# Patient Record
Sex: Female | Born: 1935 | Race: White | Hispanic: No | Smoking: Former smoker
Health system: Southern US, Community
[De-identification: ages and names within clinical notes are randomized; demographics above are authoritative.]

## PROBLEM LIST (undated history)

## (undated) DIAGNOSIS — Z87440 Personal history of urinary (tract) infections: Secondary | ICD-10-CM

## (undated) DIAGNOSIS — Z8619 Personal history of other infectious and parasitic diseases: Secondary | ICD-10-CM

## (undated) DIAGNOSIS — N184 Chronic kidney disease, stage 4 (severe): Secondary | ICD-10-CM

## (undated) DIAGNOSIS — Z872 Personal history of diseases of the skin and subcutaneous tissue: Secondary | ICD-10-CM

## (undated) DIAGNOSIS — I2510 Atherosclerotic heart disease of native coronary artery without angina pectoris: Secondary | ICD-10-CM

## (undated) DIAGNOSIS — E785 Hyperlipidemia, unspecified: Secondary | ICD-10-CM

## (undated) DIAGNOSIS — F419 Anxiety disorder, unspecified: Secondary | ICD-10-CM

## (undated) DIAGNOSIS — I480 Paroxysmal atrial fibrillation: Secondary | ICD-10-CM

## (undated) DIAGNOSIS — J9690 Respiratory failure, unspecified, unspecified whether with hypoxia or hypercapnia: Secondary | ICD-10-CM

## (undated) DIAGNOSIS — I10 Essential (primary) hypertension: Secondary | ICD-10-CM

## (undated) DIAGNOSIS — I447 Left bundle-branch block, unspecified: Secondary | ICD-10-CM

## (undated) DIAGNOSIS — I5042 Chronic combined systolic (congestive) and diastolic (congestive) heart failure: Secondary | ICD-10-CM

## (undated) HISTORY — DX: Anxiety disorder, unspecified: F41.9

## (undated) HISTORY — DX: Personal history of urinary (tract) infections: Z87.440

## (undated) HISTORY — PX: SALPINGOOPHORECTOMY: SHX82

## (undated) HISTORY — PX: APPENDECTOMY: SHX54

## (undated) HISTORY — DX: Personal history of diseases of the skin and subcutaneous tissue: Z87.2

## (undated) HISTORY — PX: ABDOMINAL HYSTERECTOMY: SHX81

## (undated) HISTORY — PX: CORONARY ANGIOPLASTY WITH STENT PLACEMENT: SHX49

## (undated) HISTORY — PX: BREAST CYST EXCISION: SHX579

## (undated) HISTORY — PX: CATARACT EXTRACTION, BILATERAL: SHX1313

## (undated) HISTORY — DX: Personal history of other infectious and parasitic diseases: Z86.19

---

## 1998-04-22 ENCOUNTER — Other Ambulatory Visit: Admission: RE | Admit: 1998-04-22 | Discharge: 1998-04-22 | Payer: Self-pay | Admitting: *Deleted

## 1999-10-27 ENCOUNTER — Other Ambulatory Visit: Admission: RE | Admit: 1999-10-27 | Discharge: 1999-10-27 | Payer: Self-pay | Admitting: *Deleted

## 2000-09-28 ENCOUNTER — Other Ambulatory Visit: Admission: RE | Admit: 2000-09-28 | Discharge: 2000-09-28 | Payer: Self-pay | Admitting: *Deleted

## 2001-10-29 ENCOUNTER — Other Ambulatory Visit: Admission: RE | Admit: 2001-10-29 | Discharge: 2001-10-29 | Payer: Self-pay | Admitting: *Deleted

## 2003-05-29 DIAGNOSIS — Z8619 Personal history of other infectious and parasitic diseases: Secondary | ICD-10-CM

## 2003-05-29 HISTORY — DX: Personal history of other infectious and parasitic diseases: Z86.19

## 2003-06-09 ENCOUNTER — Encounter: Admission: RE | Admit: 2003-06-09 | Discharge: 2003-06-09 | Payer: Self-pay

## 2003-07-16 ENCOUNTER — Ambulatory Visit (HOSPITAL_BASED_OUTPATIENT_CLINIC_OR_DEPARTMENT_OTHER): Admission: RE | Admit: 2003-07-16 | Discharge: 2003-07-16 | Payer: Self-pay | Admitting: Urology

## 2003-09-23 ENCOUNTER — Inpatient Hospital Stay (HOSPITAL_COMMUNITY): Admission: EM | Admit: 2003-09-23 | Discharge: 2003-09-28 | Payer: Self-pay | Admitting: Emergency Medicine

## 2003-09-24 ENCOUNTER — Encounter: Payer: Self-pay | Admitting: Cardiology

## 2003-10-10 ENCOUNTER — Inpatient Hospital Stay (HOSPITAL_COMMUNITY): Admission: EM | Admit: 2003-10-10 | Discharge: 2003-10-14 | Payer: Self-pay | Admitting: Emergency Medicine

## 2003-10-13 ENCOUNTER — Encounter (INDEPENDENT_AMBULATORY_CARE_PROVIDER_SITE_OTHER): Payer: Self-pay | Admitting: *Deleted

## 2004-04-08 ENCOUNTER — Encounter: Payer: Self-pay | Admitting: Cardiovascular Disease

## 2006-03-01 ENCOUNTER — Encounter: Admission: RE | Admit: 2006-03-01 | Discharge: 2006-03-01 | Payer: Self-pay | Admitting: Family Medicine

## 2008-02-13 ENCOUNTER — Ambulatory Visit: Payer: Self-pay | Admitting: Vascular Surgery

## 2010-01-11 ENCOUNTER — Encounter: Admission: RE | Admit: 2010-01-11 | Discharge: 2010-01-11 | Payer: Self-pay | Admitting: Family Medicine

## 2010-02-01 ENCOUNTER — Encounter: Admission: RE | Admit: 2010-02-01 | Discharge: 2010-02-01 | Payer: Self-pay | Admitting: Family Medicine

## 2010-06-24 ENCOUNTER — Encounter: Payer: Self-pay | Admitting: Cardiovascular Disease

## 2010-08-24 ENCOUNTER — Ambulatory Visit: Payer: Self-pay | Admitting: Cardiovascular Disease

## 2010-08-24 DIAGNOSIS — R011 Cardiac murmur, unspecified: Secondary | ICD-10-CM

## 2010-08-24 DIAGNOSIS — I10 Essential (primary) hypertension: Secondary | ICD-10-CM

## 2010-08-24 DIAGNOSIS — I359 Nonrheumatic aortic valve disorder, unspecified: Secondary | ICD-10-CM | POA: Insufficient documentation

## 2010-08-24 DIAGNOSIS — R0989 Other specified symptoms and signs involving the circulatory and respiratory systems: Secondary | ICD-10-CM

## 2010-09-09 ENCOUNTER — Encounter: Payer: Self-pay | Admitting: Cardiovascular Disease

## 2010-09-09 ENCOUNTER — Ambulatory Visit: Payer: Self-pay

## 2010-09-09 ENCOUNTER — Ambulatory Visit: Payer: Self-pay | Admitting: Cardiology

## 2010-09-09 ENCOUNTER — Ambulatory Visit (HOSPITAL_COMMUNITY): Admission: RE | Admit: 2010-09-09 | Discharge: 2010-09-09 | Payer: Self-pay | Admitting: Cardiovascular Disease

## 2010-11-17 ENCOUNTER — Ambulatory Visit: Payer: Self-pay | Admitting: Cardiovascular Disease

## 2010-12-28 NOTE — Assessment & Plan Note (Signed)
Summary: NPV   Visit Type:  Initial Consult Referring Provider:  Elvis Coil, M.D. Primary Provider:  Ursula Beath  CC:  Murmur.  History of Present Illness: 77 year-old woman presents for initial evaluation of a cardiac murmur.  She has been followed by the [facility] group but has decided to change to our practice.   The patient has had echocardiograms in the past and these have shown aortic sclerosis and mitral valve thickening. This dates back to 2004 and I don't have access to more recent echo studies.  She was recently seen by Dr Hyman Hopes and referred here for further evaluation and continuation of care.  She complains of exertional dyspnea, stable over several years. She denies exertional chest pain or tightness. She has an occasional 'stinging' pain in the center of her chest lasting only a short period. These pains are not associated with exertion. The patient denies edema, orthopnea, PND, or syncope. She has no history of stroke or TIA.     Current Medications (verified): 1)  Alprazolam 0.25 Mg Tabs (Alprazolam) .... As Needed 2)  Hydrocodone-Acetaminophen 5-500 Mg Tabs (Hydrocodone-Acetaminophen) .... As Needed 3)  Nisoldipine 8.5 Mg Xr24h-Tab (Nisoldipine) .... Take 1 Tab By Mouth At Bedtime 4)  Preservision/lutein  Caps (Multiple Vitamins-Minerals) .... Take 1 Capsule By Mouth Once A Day 5)  Atenolol 50 Mg Tabs (Atenolol) .... Take  1  Tablet By Mouth Daily 6)  Diovan 160 Mg Tabs (Valsartan) .... Take 1/2  Tablet By Mouth Two Times A Day 7)  Brahmi-Gotu Marathon Oil .... Take 1 Capsule By Mouth Two Times A Day For Memory 8)  Nasal Spray .... As Needed 9)  Aspirin 81 Mg Tbec (Aspirin) .... Take 1 Tablet By Mouth Two Times A Day 10)  Cinnamon 500 Mg Caps (Cinnamon) .... 1000mg  Two Times A Day 11)  Vitamin D 1000 Unit Tabs (Cholecalciferol) .... Take 1 Tablet By Mouth Once A Day 12)  L-Lysine 500 Mg Tabs (Lysine) .... Take 1 Tablet By Mouth Once A Day 13)   Glucosamine-Chondroitin  Caps (Glucosamine-Chondroit-Vit C-Mn) .... Take 1 Capsule By Mouth Once A Day 14)  Cod Liver Oil  Caps (Cod Liver Oil) .... Take 1 Capsule By Mouth Once A Day 15)  Calcium Plus Vitamin D3 600-500 Mg-Unit Caps (Calcium Carb-Cholecalciferol) .... Take 1 Tablet By Mouth Two Times A Day 16)  Vitamin C 500 Mg Tabs (Ascorbic Acid) .... Take 1 Tablet By Mouth Once A Day 17)  Super B Complex  Tabs (B Complex-C) .... Take 1 Tablet By Mouth Once A Day 18)  Vitamin E 400 Unit Caps (Vitamin E) .... Take 1 Capsule By Mouth Once A Day  Allergies (verified): 1)  ! Sulfa  Past History:  Past medical, surgical, family and social histories (including risk factors) reviewed, and no changes noted (except as noted below).  Past Medical History: Reviewed history from 07/06/2010 and no changes required.   Bilateral lower extremity edema   Chronic renal insufficiency HX   Labile hypertension.   Aortic insufficiency   Mitral valve regurgitation   Anxiety.    Major depressive disorder, single episode, severe.    Failure to thrive.    history of shingles in July 2004 on the left side of her thorax and postherpetic neuralgia.     She has a history of psoriasis,    history of recurrent urinary tract infections.    History of salpingo-oophorectomy.   Past Surgical History: Reviewed history from 07/06/2010 and no changes required.  She is status post appendectomy, breast cyst removed.  Family History: Reviewed history from 07/06/2010 and no changes required.  Significant for diabetes, cancer, specifically lung cancer  in family members who were smokers.   Social History: Reviewed history from 07/06/2010 and no changes required.  She denies alcohol. Quit smoking tobacco in 1980.   Patient is widowed and lives in [place]  Review of Systems       Negative except as per HPI   Vital Signs:  Patient profile:   77 year old female Height:      61 inches Weight:      136  pounds BMI:     25.79 Pulse rate:   60 / minute Pulse rhythm:   regular Resp:     18 per minute BP sitting:   188 / 84  (left arm) Cuff size:   regular  Vitals Entered By: Vikki Ports (August 24, 2010 3:32 PM)  Serial Vital Signs/Assessments:  Time      Position  BP       Pulse  Resp  Temp     By           R Arm     190/84                         Vikki Ports   Physical Exam  General:  Pt is well-developed, alert and oriented, elderly woman no acute distress HEENT: normal Neck: no thyromegaly           JVP normal, carotid upstrokes normal with bilateral bruits right greater than left Lungs: CTA Chest: equal expansion  CV: Apical impulse nondisplaced, RRR with a harsh systolic murmur over the RUSB and soft systolic murmur at the apex Abd: soft, NT, positive BS, no HSM, no bruit Back: no CVA tenderness Ext: no clubbing, cyanosis, or edema        femoral pulses 2+ without bruits        pedal pulses 2+ and equal Skin: warm, dry, no rash Neuro: CNII-XII intact,strength 5/5 = b/l    EKG  Procedure date:  08/24/2010  Findings:      Sinus bradycardia HR 58 bpm, LBBB  Impression & Recommendations:  Problem # 1:  AORTIC STENOSIS (ICD-424.1) Echo from 2004 reviewed and showed normal LV function with aortic and mitral valve thickening without significant stenosis at that time. Her exam suggests at least mild AS. Recommend 2D echo to fully evaluate her valvular pathology.  She has no history or exam findings suggestive of heart failure.  Her updated medication list for this problem includes:    Atenolol 50 Mg Tabs (Atenolol) .Marland Kitchen... Take  1  tablet by mouth daily    Diovan 160 Mg Tabs (Valsartan) .Marland Kitchen... Take 1/2  tablet by mouth two times a day  Orders: Echocardiogram (Echo)  Problem # 2:  CAROTID BRUIT (ICD-785.9) Pt has a significant right carotid bruit and should be evaluated with a carotid duplex. She is appropriately on antiplatelet Rx with ASA.  Orders: Carotid  Duplex (Carotid Duplex)  Problem # 3:  HYPERTENSION, HEART UNCONTROLLED W/O ASSOC CHF (ICD-402.00) Pt on multiple agents. She reports a history of white coat HTN. I asked her to bring in home readings at the time ofher followup visit.  Her updated medication list for this problem includes:    Nisoldipine 8.5 Mg Xr24h-tab (Nisoldipine) .Marland Kitchen... Take 1 tab by mouth at bedtime    Atenolol 50 Mg Tabs (Atenolol) .Marland KitchenMarland KitchenMarland KitchenMarland Kitchen  Take  1  tablet by mouth daily    Diovan 160 Mg Tabs (Valsartan) .Marland Kitchen... Take 1/2  tablet by mouth two times a day    Aspirin 81 Mg Tbec (Aspirin) .Marland Kitchen... Take 1 tablet by mouth two times a day  BP today: 188/84  Other Orders: EKG w/ Interpretation (93000)  Patient Instructions: 1)  Your physician recommends that you schedule a follow-up appointment in: 1-2 months. 2)  Your physician recommends that you continue on your current medications as directed. Please refer to the Current Medication list given to you today. 3)  Your physician has requested that you have a carotid duplex. This test is an ultrasound of the carotid arteries in your neck. It looks at blood flow through these arteries that supply the brain with blood. Allow one hour for this exam. There are no restrictions or special instructions. 4)  Your physician has requested that you have an echocardiogram.  Echocardiography is a painless test that uses sound waves to create images of your heart. It provides your doctor with information about the size and shape of your heart and how well your heart's chambers and valves are working.  This procedure takes approximately one hour. There are no restrictions for this procedure. 5)  Please record your Blood Pressure at home and bring those readings to your next appointment with Dr.Cooper.

## 2010-12-30 NOTE — Assessment & Plan Note (Signed)
Summary: ROV   Visit Type:  Follow-up echo Referring Provider:  Elvis Coil, M.D. Primary Provider:  Ursula Beath  CC:  none.  History of Present Illness: 77 year-old woman returns for followup of valvular heart disease. Overall she is doing well and only complains of mild, stable exertional dyspnea. No chest pian, palps, lightheadedness, headache, or syncope. No leg swelling.  Current Medications (verified): 1)  Hydrocodone-Acetaminophen 5-500 Mg Tabs (Hydrocodone-Acetaminophen) .... As Needed 2)  Preservision/lutein  Caps (Multiple Vitamins-Minerals) .... Take 1 Capsule By Mouth Once A Day 3)  Atenolol 50 Mg Tabs (Atenolol) .... Take  1  Tablet By Mouth Daily 4)  Diovan 160 Mg Tabs (Valsartan) .... Take 1/2  Tablet By Mouth Two Times A Day 5)  Aspirin 81 Mg Tbec (Aspirin) .... Take 1 Tablet By Mouth Two Times A Day 6)  Cinnamon 500 Mg Caps (Cinnamon) .... 1000mg  Two Times A Day 7)  Vitamin D 1000 Unit Tabs (Cholecalciferol) .... Take 1 Tablet By Mouth Once A Day 8)  L-Lysine 500 Mg Tabs (Lysine) .... Take 1 Tablet By Mouth Once A Day 9)  Glucosamine-Chondroitin  Caps (Glucosamine-Chondroit-Vit C-Mn) .... Take 1 Capsule By Mouth Once A Day 10)  Cod Liver Oil  Caps (Cod Liver Oil) .... Take 1 Capsule By Mouth Once A Day 11)  Calcium Plus Vitamin D3 600-500 Mg-Unit Caps (Calcium Carb-Cholecalciferol) .... Take 1 Tablet By Mouth Two Times A Day 12)  Vitamin C 500 Mg Tabs (Ascorbic Acid) .... Take 1 Tablet By Mouth Once A Day 13)  Super B Complex  Tabs (B Complex-C) .... Take 1 Tablet By Mouth Once A Day 14)  Vitamin E 400 Unit Caps (Vitamin E) .... Take 1 Capsule By Mouth Once A Day 15)  Fish Oil 1200 Mg Caps (Omega-3 Fatty Acids) .... Take 1 Capsule By Mouth Two Times A Day 16)  Flax Seed Oil 1000 Mg Caps (Flaxseed (Linseed)) .... Take 1 Capsule By Mouth Two Times A Day 17)  Sular 8.5 Mg Xr24h-Tab (Nisoldipine) .... Take 1 Tab By Mouth At Bedtime  Allergies: 1)  ! Sulfa  Past  History:  Past medical history reviewed for relevance to current acute and chronic problems.  Past Medical History: Reviewed history from 07/06/2010 and no changes required.   Bilateral lower extremity edema   Chronic renal insufficiency HX   Labile hypertension.   Aortic insufficiency   Mitral valve regurgitation   Anxiety.    Major depressive disorder, single episode, severe.    Failure to thrive.    history of shingles in July 2004 on the left side of her thorax and postherpetic neuralgia.     She has a history of psoriasis,    history of recurrent urinary tract infections.    History of salpingo-oophorectomy.   Review of Systems       Negative except as per HPI   Vital Signs:  Patient profile:   77 year old female Height:      61 inches Weight:      132.50 pounds BMI:     25.13 Pulse rate:   56 / minute Pulse rhythm:   regular Resp:     18 per minute BP sitting:   180 / 80  (left arm) Cuff size:   large  Vitals Entered By: Vikki Ports (December 09, 2010 4:18 PM)  Physical Exam  General:  Pt is well-developed, alert and oriented, elderly woman no acute distress HEENT: normal Neck: no thyromegaly  JVP normal, carotid upstrokes normal with bilateral bruits right greater than left Lungs: CTA Chest: equal expansion  CV: Apical impulse nondisplaced, RRR with a harsh systolic murmur over the RUSB and soft systolic murmur at the apex Abd: soft, NT, positive BS, no HSM, no bruit Back: no CVA tenderness Ext: no clubbing, cyanosis, or edema        femoral pulses 2+ without bruits        pedal pulses 2+ and equal Skin: warm, dry, no rash Neuro: CNII-XII intact,strength 5/5 = b/l    Echocardiogram  Procedure date:  09/09/2010  Findings:      Study Conclusions            - Left ventricle: The cavity size was normal. Wall thickness was       increased in a pattern of mild LVH. Systolic function was mildly       to moderately reduced. The estimated  ejection fraction was in the       range of 40% to 45%. Diffuse hypokinesis. Features are consistent       with a pseudonormal left ventricular filling pattern, with       concomitant abnormal relaxation and increased filling pressure       (grade 2 diastolic dysfunction).     - Aortic valve: Valve mobility was restricted. Moderate to severe       regurgitation.     - Mitral valve: Calcified annulus. Mildly thickened leaflets . Mild       regurgitation.     - Tricuspid valve: Moderate regurgitation.     - Pulmonary arteries: Systolic pressure was mildly increased. PA       peak pressure: 40mm Hg (S).  Impression & Recommendations:  Problem # 1:  AORTIC STENOSIS (ICD-424.1) Pt has mixed aortic valvular disease, predominately aortic insufficiency. Interestingly, her exam shows a predominant systolic rather than a diastolic murmur. She has mild LV dysfunction but no evidence of LV dilatation. I have recommended repeating an echocardiogram in October 2012 for one year followup and will reevaluate her at that point. No indication for valve surgery at this point.  Her updated medication list for this problem includes:    Atenolol 50 Mg Tabs (Atenolol) .Marland Kitchen... Take  1  tablet by mouth daily    Diovan 160 Mg Tabs (Valsartan) .Marland Kitchen... Take 1/2  tablet by mouth two times a day  Problem # 2:  HYPERTENSION, HEART UNCONTROLLED W/O ASSOC CHF (ICD-402.00) BP markedly elevated. May be a component of white coat HTN, but recommended addition of amlodipine 5 mg daily.  The following medications were removed from the medication list:    Nisoldipine 8.5 Mg Xr24h-tab (Nisoldipine) .Marland Kitchen... Take 1 tab by mouth at bedtime Her updated medication list for this problem includes:    Atenolol 50 Mg Tabs (Atenolol) .Marland Kitchen... Take  1  tablet by mouth daily    Diovan 160 Mg Tabs (Valsartan) .Marland Kitchen... Take 1/2  tablet by mouth two times a day    Aspirin 81 Mg Tbec (Aspirin) .Marland Kitchen... Take 1 tablet by mouth two times a day    Sular 8.5 Mg  Xr24h-tab (Nisoldipine) .Marland Kitchen... Take 1 tab by mouth at bedtime    Amlodipine Besylate 5 Mg Tabs (Amlodipine besylate) .Marland Kitchen... Take one tablet by mouth daily  BP today: 180/80 Prior BP: 188/84 (08/24/2010)  Patient Instructions: 1)  Your physician has recommended you make the following change in your medication: START Amlodipine 5mg  take one tablet by mouth daily 2)  Your physician wants you to follow-up in:  OCTOBER 2012.   You will receive a reminder letter in the mail two months in advance. If you don't receive a letter, please call our office to schedule the follow-up appointment. 3)  Your physician has requested that you have an echocardiogram in OCTOBER 2012.  Echocardiography is a painless test that uses sound waves to create images of your heart. It provides your doctor with information about the size and shape of your heart and how well your heart's chambers and valves are working.  This procedure takes approximately one hour. There are no restrictions for this procedure. Prescriptions: AMLODIPINE BESYLATE 5 MG TABS (AMLODIPINE BESYLATE) Take one tablet by mouth daily  #30 x 11   Entered by:   Julieta Gutting, RN, BSN   Authorized by:   Norva Karvonen, MD   Signed by:   Julieta Gutting, RN, BSN on 12/09/2010   Method used:   Electronically to        [facility] [phone]* (retail)       [address]       [place], [place]  [address]       Ph: [phone]       Fax: [phone]   RxID:   [id number]

## 2011-01-12 ENCOUNTER — Encounter: Payer: Self-pay | Admitting: Internal Medicine

## 2011-01-12 ENCOUNTER — Ambulatory Visit (INDEPENDENT_AMBULATORY_CARE_PROVIDER_SITE_OTHER): Payer: Medicare Other | Admitting: Internal Medicine

## 2011-01-12 VITALS — BP 164/78 | HR 54 | Temp 98.0°F | Ht 60.0 in | Wt 130.0 lb

## 2011-01-12 DIAGNOSIS — I119 Hypertensive heart disease without heart failure: Secondary | ICD-10-CM

## 2011-01-12 DIAGNOSIS — M810 Age-related osteoporosis without current pathological fracture: Secondary | ICD-10-CM | POA: Insufficient documentation

## 2011-01-12 DIAGNOSIS — N189 Chronic kidney disease, unspecified: Secondary | ICD-10-CM | POA: Insufficient documentation

## 2011-01-12 DIAGNOSIS — E785 Hyperlipidemia, unspecified: Secondary | ICD-10-CM

## 2011-01-12 DIAGNOSIS — M7989 Other specified soft tissue disorders: Secondary | ICD-10-CM

## 2011-01-12 LAB — BASIC METABOLIC PANEL
BUN: 22 mg/dL (ref 6–23)
CO2: 32 mEq/L (ref 19–32)
Calcium: 10.2 mg/dL (ref 8.4–10.5)
Chloride: 100 mEq/L (ref 96–112)
Creatinine, Ser: 1.7 mg/dL — ABNORMAL HIGH (ref 0.4–1.2)
GFR: 31.02 mL/min — ABNORMAL LOW (ref >60.00)
Glucose, Bld: 92 mg/dL (ref 70–99)
Potassium: 4 mEq/L (ref 3.5–5.1)
Sodium: 139 mEq/L (ref 135–145)

## 2011-01-12 MED ORDER — VALSARTAN 160 MG PO TABS: 160.0000 mg | ORAL_TABLET | Freq: Two times a day (BID) | ORAL | Status: DC

## 2011-01-12 NOTE — Assessment & Plan Note (Signed)
Unknown stage. Obtain CBC and chem7. Continue ARB and nephrology yearly followup. Recommend improved BP control

## 2011-01-12 NOTE — Assessment & Plan Note (Signed)
No current clinical evidence of volume overload.  Likely etiology is calcium channel blocker. Continue norvasc for now while attempting improved BP control. Sodium restriction and elevation of legs when possible. Re-evaluate kidney function.

## 2011-01-12 NOTE — Assessment & Plan Note (Signed)
Pt not interested in further evaluation

## 2011-01-12 NOTE — Assessment & Plan Note (Signed)
Suboptimal control. Asx. Increase diovan 160mg  po bid. Monitor bp as outpt and f/u as scheduled for re-evaluation. Consider d/c of sular or norvasc if achieves good control at next visit. Recommend sodium restriction.

## 2011-01-12 NOTE — Progress Notes (Signed)
  Subjective:    Patient ID: [name], female    DOB: July 09, 1930, 77 y.o.   MRN: [id number]  HPI Pt presents to clinic to reestablish primary care and for evaluation of HTN.  Cardiology records reviewed. H/o mild AS and MVR with last known EF 40-45%. Last cardiology visit BP elevated (SBP~180) and norvasc 5mg  qd added. BP mildly improved however notes LE swelling since beginning medication. Sx worse later in the day. Admits to intermittent sodium ingestion. Has h/o CRI followed by nephrology q year with unknown creatinine. States self decreased diovan dose from 160mg  bid to 80mg  bid without side effects. Also recalls better BP control while taking higher dose diovan. Reviewed preventive care issues and pt declines mammography and pap smears. Pneumovax reportedly utd 11/11. States has h/o osteoporosis with no recent BMD and on no medication for bone loss. Does take calcium/vitamin d supplementation. Recalls h/o hyperlipidemia treated with statin tx and was intolerant. Declines further BMD or cholesterol treatment. No other complaint.  Reviewed PMH, PSH, medications, allergies, social hx and family hx.    Review of Systems  Constitutional: Negative for fever, chills and fatigue.  HENT: Negative for ear pain, congestion and facial swelling.   Eyes: Negative for pain, discharge and redness.  Respiratory: Positive for wheezing. Negative for cough and shortness of breath.   Cardiovascular: Positive for leg swelling. Negative for chest pain and palpitations.  Gastrointestinal: Negative for nausea, abdominal pain and blood in stool.  Genitourinary: Negative for frequency, hematuria and difficulty urinating.  Musculoskeletal: Negative for back pain, arthralgias and gait problem.  Skin: Negative for color change and rash.  Neurological: Negative for dizziness, seizures and syncope.  Hematological: Negative for adenopathy. Does not bruise/bleed easily.  Psychiatric/Behavioral: Negative for  behavioral problems, confusion and agitation.       Objective:   Physical Exam  Constitutional: She appears well-developed and well-nourished. No distress.  HENT:  Head: Normocephalic and atraumatic.  Right Ear: Tympanic membrane and external ear normal.  Left Ear: Tympanic membrane and external ear normal.  Nose: Nose normal.  Mouth/Throat: Oropharynx is clear and moist. No oropharyngeal exudate.  Eyes: Conjunctivae are normal. Right eye exhibits no discharge. Left eye exhibits no discharge. No scleral icterus.  Neck: Neck supple. No JVD present. No thyromegaly present.  Cardiovascular: Normal rate, regular rhythm, S1 normal and S2 normal.  Exam reveals no S3.   Murmur heard.  Systolic murmur is present with a grade of 3/6       Best heard RSB  Pulmonary/Chest: Effort normal and breath sounds normal. No respiratory distress. She has no wheezes. She has no rales.  Abdominal: Soft. She exhibits no distension. There is no tenderness.  Lymphadenopathy:    She has no cervical adenopathy.  Neurological: She is alert. Gait normal.  Skin: Skin is warm and dry. No rash noted. She is not diaphoretic. No pallor.  Psychiatric: Her speech is normal and behavior is normal. Thought content normal. Her mood appears not anxious. Her affect is not blunt and not inappropriate. She does not exhibit a depressed mood.          Assessment & Plan:

## 2011-01-13 ENCOUNTER — Telehealth: Payer: Self-pay | Admitting: Internal Medicine

## 2011-01-13 LAB — CBC WITH DIFFERENTIAL/PLATELET
Basophils Absolute: 0 10*3/uL (ref 0.0–0.1)
Basophils Relative: 0.6 % (ref 0.0–3.0)
Eosinophils Absolute: 0.4 10*3/uL (ref 0.0–0.7)
Eosinophils Relative: 5.7 % — ABNORMAL HIGH (ref 0.0–5.0)
HCT: 35.7 % — ABNORMAL LOW (ref 36.0–46.0)
Hemoglobin: 12.1 g/dL (ref 12.0–15.0)
Lymphocytes Relative: 21.2 % (ref 12.0–46.0)
Lymphs Abs: 1.6 10*3/uL (ref 0.7–4.0)
MCHC: 33.8 g/dL (ref 30.0–36.0)
MCV: 91.9 fl (ref 78.0–100.0)
Monocytes Absolute: 0.6 10*3/uL (ref 0.1–1.0)
Monocytes Relative: 8 % (ref 3.0–12.0)
Neutro Abs: 4.9 10*3/uL (ref 1.4–7.7)
Neutrophils Relative %: 64.5 % (ref 43.0–77.0)
Platelets: 285 10*3/uL (ref 150.0–400.0)
RBC: 3.88 Mil/uL (ref 3.87–5.11)
RDW: 13.1 % (ref 11.5–14.6)
WBC: 7.7 10*3/uL (ref 4.5–10.5)

## 2011-01-13 NOTE — Telephone Encounter (Signed)
Pt is returning call. Pls call back asap.

## 2011-01-13 NOTE — Telephone Encounter (Signed)
Pt aware.

## 2011-01-13 NOTE — Telephone Encounter (Signed)
Message copied by Kyung Rudd on Thu Jan 13, 2011  3:12 PM ------      Message from: Letitia Libra, Maisie Fus      Created: Thu Jan 13, 2011  2:18 PM       Cbc nl. chem7 shows mild elevation of kidney fxn (h/o cri seeing nephrology). Can be followed. Would take bp medication as we discusssed

## 2011-02-08 NOTE — Letter (Signed)
Summary: [facility]  [facility]   Imported By: Kassie Mends 02/01/2011 11:46:28  _____________________________________________________________________  External Attachment:    Type:   Image     Comment:   External Document

## 2011-02-14 ENCOUNTER — Ambulatory Visit: Payer: Medicare Other | Admitting: Internal Medicine

## 2011-02-21 ENCOUNTER — Ambulatory Visit (INDEPENDENT_AMBULATORY_CARE_PROVIDER_SITE_OTHER): Payer: Medicare Other | Admitting: Internal Medicine

## 2011-02-21 ENCOUNTER — Encounter: Payer: Self-pay | Admitting: Internal Medicine

## 2011-02-21 DIAGNOSIS — G8929 Other chronic pain: Secondary | ICD-10-CM

## 2011-02-21 DIAGNOSIS — M549 Dorsalgia, unspecified: Secondary | ICD-10-CM

## 2011-02-21 DIAGNOSIS — M7989 Other specified soft tissue disorders: Secondary | ICD-10-CM

## 2011-02-21 DIAGNOSIS — I119 Hypertensive heart disease without heart failure: Secondary | ICD-10-CM

## 2011-02-21 MED ORDER — NISOLDIPINE ER 8.5 MG PO TB24: 8.5000 mg | ORAL_TABLET | Freq: Every day | ORAL | Status: DC

## 2011-02-21 MED ORDER — HYDRALAZINE HCL 25 MG PO TABS: 25.0000 mg | ORAL_TABLET | Freq: Three times a day (TID) | ORAL | Status: DC

## 2011-02-21 MED ORDER — HYDROCODONE-ACETAMINOPHEN 5-500 MG PO TABS: 1.0000 | ORAL_TABLET | Freq: Four times a day (QID) | ORAL | Status: DC | PRN

## 2011-03-07 ENCOUNTER — Telehealth: Payer: Self-pay | Admitting: *Deleted

## 2011-03-07 ENCOUNTER — Encounter: Payer: Self-pay | Admitting: Internal Medicine

## 2011-03-07 DIAGNOSIS — M549 Dorsalgia, unspecified: Secondary | ICD-10-CM | POA: Insufficient documentation

## 2011-03-07 NOTE — Telephone Encounter (Signed)
Hydralazine is causing her to SOB and constant headache x 2 weeks.  Stopped it Saturday night, and feels better other BP running 174/76, 147/59, pulse 59.   After stopping the Hydralazine 142/64, pulse 62. Headache and SOB is much better. [facility]

## 2011-03-07 NOTE — Assessment & Plan Note (Signed)
Rf lortab prn. Aware of potential for sedation, addiction and/or tolerance.

## 2011-03-07 NOTE — Progress Notes (Signed)
  Subjective:    Patient ID: [name], female    DOB: 1930-10-21, 77 y.o.   MRN: [id number]  HPI Pt presents to clinic for f/u of HTN. Pt stopped norvasc due to leg swelling. Still taking sular. Home SBP's  ~180 but no recent checks.. Asx without ha, dizziness, cp or neurologic deficit. BP rechecked by myself to be 168/84. Wishes to avoid diuretics. Notes chronic intermittent LBP without radicular leg pain. Denies injury or trauma weaknes or numbness. Relates h/o ruptured disc. No exacerbating or alleviating factors.  Reviewed pmh, allergies and medications    Review of Systems  Constitutional: Negative for fatigue.  Respiratory: Negative for chest tightness and shortness of breath.   Cardiovascular: Positive for leg swelling. Negative for chest pain and palpitations.  Neurological: Negative for dizziness, syncope, speech difficulty, weakness and numbness.       Objective:   Physical Exam  Vitals reviewed. Constitutional: She appears well-developed and well-nourished. No distress.  HENT:  Head: Normocephalic and atraumatic.  Eyes: Conjunctivae are normal. No scleral icterus.  Neck: Neck supple. No JVD present.  Cardiovascular: Normal rate, regular rhythm and normal heart sounds.   Pulmonary/Chest: Effort normal and breath sounds normal.  Neurological: She is alert.  Skin: Skin is warm and dry. She is not diaphoretic.  Psychiatric: She has a normal mood and affect.          Assessment & Plan:

## 2011-03-07 NOTE — Assessment & Plan Note (Signed)
Improved s/p d/c of norvasc.

## 2011-03-07 NOTE — Telephone Encounter (Addendum)
Pt feels fine and knows that Dr Rodena Medin will see the note tomorrow.  Will send to another MD per request of  Dr. Ilda Foil nurses just to be sure no intervention is needed.  If not, please send back to Dr. Rodena Medin with documentation.

## 2011-03-07 NOTE — Telephone Encounter (Signed)
Noted; ROV Dr Rodena Medin this week; continue to hold apresoline

## 2011-03-07 NOTE — Assessment & Plan Note (Signed)
Suboptimal control. Asx. Add hydralazine 50mg  tid. Resume bp home checks and report results for review.

## 2011-03-07 NOTE — Telephone Encounter (Signed)
No new orders.  Pt has appt with Dr. Ty Hilts in one week already.  Per Dr. Kirtland Bouchard.

## 2011-03-14 ENCOUNTER — Ambulatory Visit (INDEPENDENT_AMBULATORY_CARE_PROVIDER_SITE_OTHER): Payer: Medicare Other | Admitting: Internal Medicine

## 2011-03-14 ENCOUNTER — Encounter: Payer: Self-pay | Admitting: Internal Medicine

## 2011-03-14 DIAGNOSIS — I119 Hypertensive heart disease without heart failure: Secondary | ICD-10-CM

## 2011-03-14 NOTE — Progress Notes (Signed)
  Subjective:    Patient ID: [name], female    DOB: 1930-05-31, 77 y.o.   MRN: [id number]  HPI patient presents to clinic for followup of hypertension. Last visit because of suboptimal control hydralazine was begun. Patient developed shortness of breath with hydralazine and stopped the medication. Shortness of breath resolved and she is back to baseline without complaint. States she is now taking her Diovan twice a day as directed feels that her blood pressure is improving. Last home blood pressure obtained was 130/74. Blood pressure view today 140/80. States is decreasing sodium intake as well. Previously did not tolerate Norvasc because of leg swelling and wishes to avoid diuretic therapy if possible. No associated headache dizziness chest discomfort or shortness of breath.    Review of Systems  Constitutional: Negative for fever and chills.  HENT: Negative for facial swelling.   Respiratory: Positive for shortness of breath. Negative for cough.   Cardiovascular: Negative for chest pain.       Objective:   Physical Exam  [nursing notereviewed. Constitutional: She appears well-developed and well-nourished. No distress.  HENT:  Head: Normocephalic and atraumatic.  Neurological: She is alert.  Skin: Skin is warm and dry. She is not diaphoretic.          Assessment & Plan:

## 2011-03-14 NOTE — Assessment & Plan Note (Signed)
Improving control. Intolerant of hydralazine. Now compliant with Diovan. Continue current regimen without change. Sodium restriction. Recommend daily blood pressure log to be submitted for review. Followup in one month or sooner if necessary.

## 2011-04-11 ENCOUNTER — Encounter: Payer: Self-pay | Admitting: Internal Medicine

## 2011-04-11 ENCOUNTER — Ambulatory Visit (INDEPENDENT_AMBULATORY_CARE_PROVIDER_SITE_OTHER): Payer: Medicare Other | Admitting: Internal Medicine

## 2011-04-11 DIAGNOSIS — N189 Chronic kidney disease, unspecified: Secondary | ICD-10-CM

## 2011-04-11 DIAGNOSIS — I119 Hypertensive heart disease without heart failure: Secondary | ICD-10-CM

## 2011-04-11 MED ORDER — NISOLDIPINE ER 17 MG PO TB24: 17.0000 mg | ORAL_TABLET | Freq: Every day | ORAL | Status: DC

## 2011-04-11 NOTE — Progress Notes (Signed)
  Subjective:    Patient ID: [name], female    DOB: 10-31-1930, 77 y.o.   MRN: [id number]  HPI Pt presents to clinic for followup of HTN. Home bp log reviewed with range typically 145-167/ 58-72 with HR's 54-68. Asx without headache, dizziness or neurologic deficits. Tolerating current regimen without difficulty. Previously intolerant of hydralazine. Remains active working in the garden. H/o CRI stable with cr 1.7 2/12 with no current evidence of fluid overload.  No active complaint.  Reviewed pmh, medications and allergies.    Review of Systems  Constitutional: Negative for fever, chills and fatigue.  Respiratory: Negative for shortness of breath and wheezing.   Cardiovascular: Negative for chest pain and palpitations.  Neurological: Negative for light-headedness and headaches.       Objective:   Physical Exam  Nursing note and vitals reviewed. Constitutional: She appears well-developed and well-nourished. No distress.  HENT:  Head: Normocephalic and atraumatic.  Right Ear: External ear normal.  Left Ear: External ear normal.  Nose: Nose normal.  Eyes: Conjunctivae are normal. No scleral icterus.  Neck: Neck supple. No JVD present. Carotid bruit is not present.  Cardiovascular: Normal rate and regular rhythm.  Exam reveals no gallop and no friction rub.   Murmur heard. Pulmonary/Chest: Effort normal and breath sounds normal. No respiratory distress. She has no wheezes. She has no rales.  Neurological: She is alert.  Skin: Skin is warm and dry. She is not diaphoretic.  Psychiatric: She has a normal mood and affect.          Assessment & Plan:

## 2011-04-11 NOTE — Assessment & Plan Note (Signed)
Stable and asx. Avoid nsaids. Consider chem7 with next visit

## 2011-04-11 NOTE — Assessment & Plan Note (Signed)
Suboptimal control. Asx. Increase sular xr 17mg  po qd. Monitor bp as outpt and notify clinic of results. Followup 6wks or sooner if necessary.

## 2011-04-12 NOTE — Procedures (Signed)
DUPLEX DEEP VENOUS EXAM - LOWER EXTREMITY   INDICATION:  Bilateral lower extremity edema.   HISTORY:  Edema:  Bilateral lower extremity edema left worse than right.  Trauma/Surgery:  No.  Pain:  Aching in legs which the patient attributes to statin drugs.  PE:  No.  Previous DVT:  No.  Anticoagulants:  No.  Other:  Multiple spider veins bilaterally on the lower extremities.   DUPLEX EXAM:                CFV   SFV   PopV  PTV    GSV                R  L  R  L  R  L  R   L  R  L  Thrombosis    0  0  0  0  0  0  0   0  0  0  Spontaneous   +  +  +  +  +  +  +   +  +  +  Phasic        +  +  +  +  +  +  +   +  +  +  Augmentation  +  +  +  +  +  +  +   +  +  +  Compressible  +  +  +  +  +  +  +   +  +  +  Competent     p  p  +  +  +  +  +   +  +  p   Legend:  + - yes  o - no  p - partial  D - decreased   IMPRESSION:  1. No evidence of deep or superficial vein thrombus bilaterally.  2. Mild reflux of the bilateral common femoral veins and the left      saphenofemoral junction.  3. The remainder of the left greater saphenous vein appears to be      mildly incompetent.  4. No evidence of Baker's cyst bilaterally.       _____________________________  Larina Earthly, M.D.   MC/MEDQ  D:  02/13/2008  T:  02/13/2008  Job:  [id number]

## 2011-04-15 NOTE — H&P (Signed)
[name], [name]                       ACCOUNT NO.:  [id number]   MEDICAL RECORD NO.:  [id number]                   PATIENT TYPE:  INP   LOCATION:  1856                                 FACILITY:  [facility]   PHYSICIAN:  Alfonse Spruce, M.D.               DATE OF BIRTH:  [birth year]   DATE OF ADMISSION:  09/23/2003  DATE OF DISCHARGE:                                HISTORY & PHYSICAL   PRIMARY CARE PHYSICIAN:  Christella Noa, M.D., family practice.   ADMITTING PHYSICIAN:  Alfonse Spruce, M.D., [facility] hospitalist   CHIEF COMPLAINT:  Patient has persistent vomiting for the last two days and  half and also she is with dehydration with the nausea, however, no diarrhea.   HISTORY OF PRESENT ILLNESS:  [name] is a 77 year old white female,  widow, and stated that she has been downhill since 05-Jun-2003 when her  husband deceased with an underlying history of congestive heart failure.  Subsequently she was doing poorly and getting infection and feeling weak but  recently, on Sunday evening, she had started to have nausea and vomiting and  continued to vomit, stopped eating and stopped drinking, and her daughter  brought her to the emergency room on Tuesday the 26th.  The patient with the  complaint of dehydration and weakness and dryness of the mouth.   PAST MEDICAL HISTORY:  1. Patient had end of July 2004 shingles which effected the left side of the     dorsal dermatomes and healed but she had pain and burning and itching and     was treated by her doctor with several medications including Duragesic     patch and Neurontin.  2. She has a history of hypertension.  3. History of recurrent urinary tract infection and has been treated with     Levaquin, but she finished seven tablet course at home more than two     weeks ago.   ALLERGIES:  SULFA.   PAST SURGICAL HISTORY:  1. She had a partial hysterectomy.  2. Appendectomy.  3. She had a cyst removed from the  breast.   FAMILY HISTORY:  She is gravida 2, para 2 and two daughters are girls,  grownup.  The other family history, that she had 13 siblings, and she has  only living two sisters and there is a positive family history of diabetes,  cancer, two brothers had lung cancer and they were smokers.   HABITS:  She is a nonsmoker, nondrinker.   She has a history of seeing Dr. Vonita Moss, the urologist, and he referred her  to Dr. Casimiro Needle, the nephrologist, and she could not stay for a special  test but never stated the reason or the illness that is involved.  She also  had a history of leaky valve and heart murmur.  She has been seen by Dr.  Nicki Guadalajara, the cardiologist, from [place].  She  had psoriasis for  the last 25 years, uses cream.   REVIEW OF SYSTEMS:  NEUROPSYCHIATRY:  Negative.  No history of strokes in  the past.  No headache, no blurred vision.  CARDIOVASCULAR:  She has no MI  in the past.  No chest pain currently.  RESPIRATORY:  No cough or  expectoration and no hemoptysis.  GASTROINTESTINAL:  She has nausea and  vomiting persistent with dehydration, however, no diarrhea.  No abdominal  pain.  No cramps.  GENITOURINARY:  She has a history of recurrent urinary  tract infection.  ENDOCRINE:  No diabetes, no thyroid disease.  MUSCULOSKELETAL:  She does feel weak.  The other reviewing of the rest of  the reviewing of the system was essentially negative.   PHYSICAL EXAMINATION:  GENERAL:  The patient is conscious, alert, oriented  x 3, no acute respiratory distress.  She has a history of hypertension,  hypertensive cardiovascular disease, hypercholesterolemia and renal disease  and osteoporosis.  VITAL SIGNS:  Her blood pressure is 152/66 on admission to the ER,  temperature 98.3, pulse 65, and respiratory rate 20 and pulse ox was 98.  HEENT:  Head was normocephalic, atraumatic.  Pupils were equal and reactive.  Conjunctivae pink.  Sclera anicteric.  Extraocular muscle  movement was  intact.  The oropharynx, she had dentures upper and lower.  NECK:  Supple.  No JVD.  No thyromegaly.  No lymphadenopathy.  __________  midline.  CHEST:  Kyphoscoliotic and she had end nerve at the distribution of the  dorsal spine between 4-6 and extended to the left side of the ribs and  touchy on palpation.  LUNGS:  Clear, however, to auscultation and percussion and no wheezes, no  rhonchi.  HEART:  PMI in the fifth intercostal space outside the midclavicular line  with underlying mild cardiomegaly.  She had hypertension with a systolic  murmur grade 2/6 and to the apex related to the base of the heart and to  aortic area and also to the carotid area with positive bruits on the  carotids bilaterally.  No gallop appreciated.  ABDOMEN:  Soft, scaphoid and positive bowel sounds.  No organ enlargement.  EXTREMITIES:  No pedal edema.  Positive pulses bilateral and symmetrical.  NEUROLOGIC:  Cranial nerves II-XII intact.  Motor and sensory intact.  Deep  tendon reflexes were 2+/2+ bilateral and symmetrical.  Motor and sensory  were intact except for generalized weakness.  SKIN:  I could not elicit the area of the psoriasis as she stated before.   Her laboratory indicating that her white count was 8.3 with a hemoglobin of  12 and hematocrit 35.7, MCV 87.4, and the platelets were 364.  She had a  urinalysis which indicating that positive large for esterase and white cells  21-50 per high-powered field with the bacteria with underlying urosepsis and  she also stated that she had occasional temperature with fever and chills.  Her CPK result was essentially negative with the CK 72 and CK-MB was 1.2.  The chemistry panel was not available at the time of dictation in the  emergency room.   CURRENT LIST OF MEDICATIONS:  1. Duragesic patch 25 mcg every 72 hours.  2. Atenolol 50 mg p.o. b.i.d.  3. Allegra 180 mg every day. 4. Levaquin 250 mg which has been finished two weeks ago.   5. Zocor 40 mg every day.  6. Neurontin 300 mg, 2-3 capsules t.i.d.  7. Diovan 160 mg every day.  8. Fosamax 70  mg every week.  9. Zetia 10 mg p.o. every day.   IMPRESSION:  1. Persistent nausea and vomiting with the possibility of association with     the Fosamax medication and as well with the possible gastritis.  2. Underlying urinary tract infection.  3. Hypertension with hypertensive cardiovascular disease.  4. Osteoporosis.  5. Hypercholesterolemia.  6. Left sided, upper dorsal spine shingles in the past followed by     postherpetic neuralgia.  7. Dehydration.  8. Bilateral carotid bruits with underlying weakness with the possible     aortic stenosis.   PLAN:  IV fluid hydration and IV antibiotics and also evaluation of the  carotid and the echocardiogram and further investigation if needed will  depend on the patient's general condition.  The laboratory for the C-MET was  not available at this time for comment and there will be further evaluation.                                                Alfonse Spruce, M.D.    Wynn Maudlin  D:  09/23/2003  T:  09/23/2003  Job:  [id number]

## 2011-04-15 NOTE — Discharge Summary (Signed)
[name], [name]                       ACCOUNT NO.:  [id number]   MEDICAL RECORD NO.:  [id number]                   PATIENT TYPE:  INP   LOCATION:  5155                                 FACILITY:  [facility]   PHYSICIAN:  [name], D.O.                 DATE OF BIRTH:  1930/08/16   DATE OF ADMISSION:  10/10/2003  DATE OF DISCHARGE:  10/14/2003                                 DISCHARGE SUMMARY   ADMISSION DIAGNOSES:  Acute dehydration,  acute renal failure and failure to  thrive.   DISCHARGE DIAGNOSES:  1. Chronic renal insufficiency, resolved.  2. Nausea.  3. Labile hypertension.  4. Anxiety.  5. Major depressive disorder, single episode, severe.  6. Failure to thrive.   CONDITION ON DISCHARGE:  Discharge weight 117 pounds. Continued poor  appetite.   CONSULTS:  1. Gastroenterology with [name], M.D.  2. Psychiatry, [name], M.D.   DISCHARGE MEDICATIONS:  1. Atenolol 50 mg p.o. q. a.m.  2. Zocor 20 mg p.o. every day.  3. Fosamax as previously ordered.  4. Diovan 80 mg p.o. every day.  5. Zetia 10 mg p.o. every day.  6. Neurontin as previously described.  7. Effexor XR 75 mg p.o. q. a.m. with up titration every 4 days by 37.5 mg     to a total dose of 150 mg p.o. every day with blood pressures to be     monitored by her primary care physician closely.  8. Megace 400 mg p.o. every day x1 week.  9. Proton pump inhibitor once a day.   PROCEDURES PERFORMED:  1. CT scan of the abdomen, pelvis.  2. Ultrasound right upper quadrant.   HISTORY OF PRESENT ILLNESS:  This is an unfortunate 77 year old depressed  Caucasian female  who recently got out approximately  4 months ago who had  been exhibiting poor oral intake and poor appetite at this time in 2003-06-02 when her husband passed away. She had been hospitalized the last week of  October and had continued to have some nausea and vomiting in the afternoon  prior to admission. She described a burning  sensation in her stomach  at  approximately 1 p.m. with nausea. This occurred when she was sitting upon  her chair. She states she woke up and felt burning, felt flushing in her  neck, chest and face. She subsequently went to the restroom and forced  herself to vomit. She stated she checked her own blood pressure at the time  and it was 211/100. For this she presented to her  primary care physician's  office who noted that she had gone into  acute renal failure with a  creatinine  of 1.8 from a baseline of less than 1.   He brought her into the emergency department, specifically with her  complaints of orthostatic type symptoms. She also had been seen by a  nephrologist which was  related to me that she had a possible mass that  needed a biopsy by [name]. She has also been seen by Dr.  Vonita Herrera, her urologist. In any event in the emergency department she had  various multiple complaints as well as her daughter with demands for  answers. She was hemodynamically stable. She did undergo the initial workup  including a CBC which revealed a WBC of 9.9, hemoglobin 12, hematocrit  34,  platelets 372, MCV 87. Her sodium  was 136, potassium 3.8, chloride 97, CO2  29, glucose 123, BUN 10, creatinine  1.3. AST 20, ALP 14, total bilirubin  0.5, calcium 9.1, albumin 3.7, lipase 52, amylase 107. Chest x-ray was  normal.   HOSPITAL COURSE:  The patient was admitted to the regular nursing floor. She  was given a GI cocktail with good response. Intravenous fluids were given.  Her blood pressure medications were decreased  secondary to clinical volume  depletion.   Her hospital course was uneventful. Gastroenterology was asked to assess her  and  initially recommended an EGD, however, this was canceled. At this time  the patient is stable for discharge. Her laboratory studies  were  unremarkable with the exception of continued mildly elevated creatinine  of  1.3. It is suspected she has  underlying chronic  renal disease, renal  insufficiency possibly secondary to hypertensive atherosclerosis. She does  have a nephrologist and I do recommend her following up with him on an  outpatient basis. We are planning on continuing her Diovan as previously  prescribed at 80 mg a day along with the addition of a hydrochlorothiazide  diuretic for additional blood pressure management.   At this time I have withheld her Clonidine, as I am not certain that much of  her complaints although may or may not be related to this medication, she is  convinced, as well as her daughter, that many of these different agents can  be implicated in her clinical  presentation. At this time I am recommending  limiting medications such as this that could adversely affect her with  intermittent  compliance.   It was recommended that [name] of psychiatry be involved in her care,  and it is much appreciated. He added Effexor as well as Remeron on a p.r.n.  basis if her symptoms are not alleviated with up titration of Effexor.   LABORATORY DATA:  Laboratory data at the time of discharge included sodium  136, potassium 3.8, BUN 14, creatinine 1.3. Multiple Hemoccults were  negative. CA-19-9 11.5 and unremarkable. Troponins were negative. Cortisol  was 15.1. She did undergo a urine collection overnight for urinary  metanephrines for a screening test due to her history of labile  hypertension, however, the staff were not capable of collecting a complete  urine and a portion was discarded. If there continues to be clinical  suspicion, it is recommended that she may undergo this as a screening test,  although I suspect this would be unlikely.   The patient underwent a CT scan of the abdomen and pelvis which was  unremarkable. The abdominal ultrasound revealed  mild bilateral  hydronephrosis, the right greater than left. I will discuss this with her urologist prior to discharge.   DISPOSITION:  Mrs.  Herrera is being discharged with a GI follow up to be  coordinated by Dr. Loreta Herrera who I have discussed  this case with and also a  follow up with her primary care physician on October 28, 2003, at  10:15  a.m. on Tuesday. As well it is recommended that she follow up with her  nephrologist, Dr. Providence Lanius.                                                [name], D.O.    ESS/MEDQ  D:  10/14/2003  T:  10/14/2003  Job:  [id number]   cc:   Talmadge Coventry, M.D.  [address]  [place]  [place] [address]  Fax: [phone]

## 2011-04-15 NOTE — Discharge Summary (Signed)
NAMEOMARA, ALCON                       ACCOUNT NO.:  [id number]   MEDICAL RECORD NO.:  [id number]                   PATIENT TYPE:  INP   LOCATION:  5505                                 FACILITY:  [facility]   PHYSICIAN:  Norwood Levo, MD               DATE OF BIRTH:  Mar 23, 1930   DATE OF ADMISSION:  09/23/2003  DATE OF DISCHARGE:  09/28/2003                                 DISCHARGE SUMMARY   ADMITTING ATTENDING:  Dr. Clinton Quant   PRIMARY CARE PHYSICIAN:  Dr. Christella Noa   UROLOGIST:  Dr. Vonita Moss   NEPHROLOGIST:  Dr. Casimiro Needle   CARDIOLOGIST:  Dr. Nicki Guadalajara from [facility]   CHIEF COMPLAINT:  Patient presents with approximately 48 hours of nausea and  vomiting without diarrhea prior to admission.   HISTORY OF PRESENT ILLNESS:  The patient is a 77 year old white female,  recently widowed four months ago, states that she has been going downhill  since her husband is deceased.  Subsequently she felt poorly with a recently  treated URI with Levaquin.  Sunday evening prior to admission she developed  nausea and vomiting, stopped eating, stopped drinking, and remained quite  lethargic.  Denies any chest pain, shortness of breath.  Has some transient  abdominal pain.  No hematemesis, hemoptysis or melena at that time.  No  palpitations.  No fevers, chills or diarrhea.   PAST MEDICAL HISTORY:  1. VZV shingles since July of 2004 affecting the left-sided thoracic and     dorsal dermatomes with excoriated vesicular rash which has been treated     with Neurontin, Duragesic, and Zovirax initially.  2. Labile hypertension recently undergoing workup by Dr. Vonita Moss, Dr.     Lowell Guitar, and Dr. Tresa Endo.  3. Psoriasis.  4. Recurrent urinary tract infections treated with Levaquin approximately     two weeks prior to admission.   ALLERGIES:  SULFA causes rash.   PAST SURGICAL HISTORY:  1. Status post partial hysterectomy.  2. Status post appendectomy.  3. Status post cyst  removed from the left breast.   FAMILY HISTORY:  Has two daughters.  Thirteen siblings.  She has only two  remaining sisters.  The family history is positive for diabetes, lung cancer  in two brothers that were smokers, and other types of cancer that are  unspecified.   SOCIAL HISTORY:  Nonsmoker, non-tobacco user.   REVIEW OF SYSTEMS:  As per Dr. Garvin Fila history and physical.   PHYSICAL EXAMINATION:  As per Dr. Garvin Fila history and physical.  Of note,  is kyphoscoliosis with left-sided peri-breast and left rib excoriated and  healing vesicular rash, and no active psoriasis.   LABS ON ADMISSION AND DIAGNOSTICS:  Of note is a urinalysis with large  amount of nitrites and WBCs and leukocyte esterase.  Consistent with a  urinary tract infection.  CK 72, MB 122, and troponin in the norm.   MEDICATIONS ON ADMISSION:  1. Duragesic patch 25 mcg q.72 hours.  2. Atenolol 50 mg p.o. b.i.d.  3. Allegra 180 p.o. every day.  4. Levaquin 250 p.o. every day terminated two weeks prior to admission.  5. Zocor 40 mg p.o. every day.  6. Neurontin 900 mg p.o. t.i.d.  7. Diovan 160 p.o. every day.  8. Fosamax 70 mg p.o. q. week.  9. Zetia 10 mg p.o. every day.   HOSPITAL COURSE:  Patient is admitted to regular medical floor with  persistent nausea and vomiting and dyspeptic symptomatology believed to be a  gastritis possibly secondary to Fosamax.  Active urinary tract infection and  labile hypertension.  As well as the remnants of a left thoracic shingles.  She is treated empirically with intravenous Zosyn.  Her Fentanyl is  increased to 50 mcg q.72 hours, Lidoderm patch is placed locally for control  of zoster-induced dermatomal pain.  And the patient is continued on  Neurontin.   Her dyspepsia continues and her nausea worsens, apparently with response to  Lidoderm patch.  This is discontinued with relief of the nausea, she is  treated empirically with Phenergan and Zofran with good response.   Upper GI  swallow is performed and patient has no obstructive, no PUD process but is  found to have GERD with hiatal hernia and no indication of achalasia or PUD  or stenoses.  H. pylori is found to be equivocal.  The patient was placed on  a PPI.   Because of apparent right thoracic murmur with radiation to the neck, an  echo was performed as well as carotid ultrasound to rule out AS and carotid  stenoses.  Echo as follows:  EF 55%, mild AI, no AS, mild TRTM and no wall  motion abnormalities.  Carotid as follows:  Negative ICA stenoses, VBA  antegrade flow.   Patient has consistent hypokalemia possibly secondary to subsequent loose  stool during her hospitalization, stool cultures and C. diff analysis is  pending at this time.  She has no fever or WBC count and thus is not placed  empirically on Flagyl.  From a urinary tract infection point, urinalysis is  positive, urine culture is still pending, blood cultures are negative after  72 hours, and Zosyn is discontinued and patient is continued on Cipro  orally.   Her diet is advanced, apparent dyspepsia and possible viral gastroenteritis  appears resolved.  Should she continue to have upper dyspeptic  symptomatology, she should discontinue Fosamax and may require an EGD for  further evaluation as an outpatient.   The patient's blood pressure appeared quite labile during her  hospitalization, often ranging as high as 185 to 191 SBP.  Atenolol is  increased to 75 b.i.d. with decrease in heart rate prompting the need for  the introduction of clonidine 0.1 p.o. b.i.d. for better blood pressure  control.  She is to undergo further evaluation by Dr. Lowell Guitar and she already  has had apparently RAS workup with renal duplexes but the patient can poorly  relate this history and these are not found on the record at this time.  Patient also has what appears to be a reactive depression, possibly a major  depression, associated to her recent loss.   She is widowed from her husband  four months ago.  She agrees to Effexor XR initiation and agrees to  psychiatric evaluation which is still pending at this time.   DIAGNOSIS ON DISCHARGE:  1. Urinary tract infection.  2. Epigastric discomfort consistent  with dyspepsia and gastroesophageal     reflux disease with hiatal hernia.  3. Labile hypertension.  4. Shingles.  5. Reactive depression.   MEDICATIONS ON DISCHARGE:  1. Duragesic 50 mcg q.72 hour patch.  2. Cipro 250 mg p.o. b.i.d. x2 days.  3. Claritin 10 mg p.o. every day.  4. Atenolol 75 mg p.o. b.i.d.  5. Clonidine 0.1 mg p.o. b.i.d.  6. KCl 20 mEq p.o. every day.  7. Zocor 40 mg p.o. q.h.s.  8. Neurontin 900 mg p.o. t.i.d.  9. Diovan 160 p.o. every day.  10.      Zetia 10 mg p.o. every day.  11.      Effexor XR 37.5 mg p.o. every day.   DIETARY RESTRICTIONS:  Cardiac-II diet.   SPECIAL INSTRUCTIONS:  Stop Fosamax until seen by your PCP.   FOLLOW UP:  Follow up with Dr. Lowell Guitar in one week, call for appointment, for  further workup of blood pressure.                                                Norwood Levo, MD    APM/MEDQ  D:  09/27/2003  T:  09/27/2003  Job:  [id number]   cc:   Christella Noa, M.D.  [address] [place]  [place], [place] [address]  Fax: [phone]   Mindi Slicker. Lowell Guitar, M.D.  [address]  [place]  [place] [address]  Fax: [phone]   Maretta Bees. Vonita Moss, M.D.  [address]  [place]  [place] [address]  Fax: [phone]   Nicki Guadalajara, M.D.  [phone]  [place], [place] [address]  Fax: [phone]   Alfonse Spruce, M.D.

## 2011-04-15 NOTE — Discharge Summary (Signed)
NAMEMIRANDA, [name]                       ACCOUNT NO.:  [id number]   MEDICAL RECORD NO.:  [id number]                   PATIENT TYPE:  INP   LOCATION:  5155                                 FACILITY:  [facility]   PHYSICIAN:  Hettie Holstein, D.O.                 DATE OF BIRTH:  Apr 08, 1930   DATE OF ADMISSION:  10/10/2003  DATE OF DISCHARGE:  10/14/2003                                 DISCHARGE SUMMARY   ADDENDUM:  At this time, I am planning on discharging [name] with  followup appointment with Dr. Loreta Ave as well as a followup appointment with  Dr. Larey Dresser, urologist, to follow up on the mild bilateral  hydronephrosis noted on ultrasound performed at this admission.  Urinalysis  at the time of discharge is currently pending.  We have written a  prescription for Levaquin 250 mg p.o. every day five days.  The patient's  daughter is instructed to call me for the results of this urinalysis, and if  this is positive I will instructed her to fill this and take this five days.   She is instructed to follow up with her primary care physician on October 28, 2003, at 10:15 a.m. on Tuesday.  She is instructed to have a followup  basic metabolic panel in one week with the results faxed to her primary care  physician, Dr. Talmadge Coventry. The phone number for her fax is [phone].   Her creatinine at the time of discharge is 1.48 and it is suspected that  etiology of this may be bladder outlet obstruction etiology which is be  further investigated by her urologist, Dr. Vonita Moss.  She is discharged.  If  she is develops further problems before, she should return to the emergency  department and suggest further evaluation of her outlet obstruction and  would recommend placement of Foley catheter at that time.                                                Hettie Holstein, D.O.    ESS/MEDQ  D:  10/14/2003  T:  10/15/2003  Job:  [id number]   cc:   Talmadge Coventry, M.D.  [address]  [place]  [place] [address]  Fax: [phone]

## 2011-04-15 NOTE — H&P (Signed)
[name], [name]                       ACCOUNT NO.:  [id number]   MEDICAL RECORD NO.:  [id number]                   PATIENT TYPE:  INP   LOCATION:  1823                                 FACILITY:  [facility]   PHYSICIAN:  Hettie Holstein, D.O.                 DATE OF BIRTH:  1930-02-21   DATE OF ADMISSION:  10/10/2003  DATE OF DISCHARGE:                                HISTORY & PHYSICAL   HISTORY:  This is a 77 year old female patient of Dr. Doreatha Lew and Dr.  Smith Mince.   CHIEF COMPLAINT:  Stopped eating/dehydration.   HISTORY OF PRESENT ILLNESS:  This is an unfortunate 77 year old depressed  Caucasian female recently widowed, approximately four months ago who had  been exhibiting poor oral intake and poor appetite since that time, June  2004, when her husband passed away. She had been hospitalized the last week  of October and has been continuing to have some nausea and vomiting  yesterday afternoon. She described a burning sensation in her stomach  approximately 1 p.m. with nausea.  This occurred while she was sitting in  her recliner chair. She states she woke up, felt a burning, felt flushing in  her neck, chest, and face. She subsequently went to the restroom and forced  herself to vomit. She stated that this helped. She checked her own blood  pressure at the time and it was 211/100 and for this she presented to her  primary care physicians office. They related to me that she had gone into  acute renal failure with a creatinine of 1.8 from a baseline of less than 1.  We brought her in through the emergency department specifically with her  complaints of orthostatic type symptoms.  She also had been seen by a  nephrologist, which it was related to me that she had a possible mass that  needed biopsy by Dr. Bonnetta Barry. She has also seen Dr. Vonita Moss, her  urologist. In any event, in the emergency department she had various  multiple complaints as well as her daughter with  demands for answers. She  was hemodynamically stable. She did undergo the initial workup from the lab  including CBC, which revealed a WBC of 9.9, hemoglobin 12.0, hematocrit  34.8, platelets of 372, MCV of 87. Her sodium was136, potassium 3.8,  chloride 97, CO2 29, glucose 123, BUN 18, creatinine 1.3. AST 20, ALT 14,  total bili of 0.5. Calcium 9.1, albumin 3.7. Lipase was 52, amylase was 107.  Chest x-ray pending at this time. She did have an EKG that appeared sinus to  me without evidence of acute ST abnormalities.   PAST MEDICAL HISTORY:  She has a history of labile hypertension, history of  shingles in July 2004 on the left side of her thorax and postherpetic  neuralgia. She has a history of psoriasis, history of recurrent urinary  tract infections. History of salpingo-oophorectomy. She reports that  she  still has her uterus. She is status post appendectomy, breast cyst removed.  She has undergone a colonoscopy seven years ago. She has not undergone upper  endoscopy by the patient. She has been using a Duragesic patch for pain.  History of mild aortic regurgitation, mild to moderate mitral regurgitation.  Her cardiologist is Dr. Tresa Endo as well as Dr. Alanda Amass. She also has a  history of hypercholesterolemia, osteoporosis. Diagnosed with a questionable  left renal pelvis lesion in September 2004 with obstacles in evaluation  secondary to hospitalizations and difficulties with hyponatremia according  to the family, a history of asymptomatic kidney stones. Her medical records  indicate that she has undergone Cardiolite in May 2004 by Dr. Tresa Endo and she  has undergone an echocardiogram by Dr. Alanda Amass as well. No evidence of  aortic stenosis was noted.   MEDICATIONS:  1. Atenolol 50 mg p.o. b.i.d.  2. Zocor 40 mg one-half p.o. daily.  3. Fosamax 70 mg 1 p.o. weekly.  4. Diovan 80 mg p.o. daily.  5. Zetia 10 mg p.o. daily.  6. Neurontin 300 mg p.r.n.  7. Duragesic patch 50 mcg every  three days.  8. Effexor 37.5 mg 1 p.o. q.a.m.  9. Clonidine XL 0.1 mg 1 p.o. b.i.d. She states that she does not take     Clonidine regularly.  10.      Allegra 180 mg 1 p.o. daily.   SOCIAL HISTORY:  As noted above the patient has recently lost her husband in  June 2004. She denies alcohol. Quit smoking tobacco in 1980.   FAMILY HISTORY:  Significant for diabetes, cancer, specifically lung cancer  in family members who were smokers. She is Gravida 2, Para 2.   ALLERGIES:  The patient has a history to sulfa, she develops hives.   REVIEW OF SYSTEMS:  The patient reports weight loss, anorexia, as above.  Burning in her stomach. She denies any chest pain, shortness of breath, or  productive cough. She denies any abdominal pain. She denies vaginal  discharge, diarrhea, constipation. She reports facial flushing, some  swelling in her feet at times.   PHYSICAL EXAMINATION:  VITAL SIGNS:  The patient's blood pressure was  154/68, temperature 98, pulse 56, respirations 18, O2 saturation on room air  96%.  GENERAL: The patient was alert, dysthymic. Her affect was flat, slow to  respond. She was awake, arousable, responded to questions appropriately.  NECK:  Supple, nontender. No palpable thyromegaly.  CHEST:  Clear. No S3, no S4.  LUNGS:  Clear.  ABDOMEN:  Soft with no palpable hepatosplenomegaly. No rigidity, no rebound.  Bowel sounds were normoactive.  HEENT:  Oral mucosa was dry.  EXTREMITIES:  There was no Costovertebral angle tenderness. Lower  extremities reveal no calf tenderness, no edema. Peripheral pulses were  palpable and symmetrical bilaterally.   LABORATORY DATA:  CBC noted a WBC of 9.9, hemoglobin 12.0, hematocrit 34.8,  platelet count 372. Sodium 136, potassium 3.8, chloride 97, CO2 29, BUN 18,  creatinine 1.3, glucose 123. AST 20, ALT 14. Calcium 9.1. Lipase was 52.  Amylase was 7. Urine studies are pending at this time. EKG as noted above revealed no LVH criteria. Her  chest x-ray is pending at this time.   IMPRESSION:  1. Failure to thrive.  2. Persistent nausea and vomiting, poor appetite, anorexia, and weight loss.  3. Depression, anxiety.  4. Labile hypertension.  5. Hypercholesterolemia.  6. Chronic pain.  7. Mild elevation of creatinine.   PLAN:  We are going to admit Ms. Limes for IV hydration as she is  clinically dry. We will follow her labs and her renal function as well and  encourage p.o. intake. Will probably recommend GI evaluation. She has not  had upper endoscopy. Much of her symptoms of weight loss can be pursued with  either inpatient or outpatient gastroenterologic evaluation.   We are going to hold her Diovan as her renal function earlier was 1.8.  Certainly with the etiology of that, it is improving. We will continue to  follow and resume Diovan at her PCP's discretion. We will try to manage her  blood pressure with if hydralazine for now as well as Atenolol.                                                Hettie Holstein, D.O.    ESS/MEDQ  D:  10/10/2003  T:  10/10/2003  Job:  [id number]

## 2011-05-19 ENCOUNTER — Telehealth: Payer: Self-pay | Admitting: *Deleted

## 2011-05-19 NOTE — Telephone Encounter (Signed)
Random BP readings from 03/2011 until today.  155/70--50  153/64--49 119/50--53 143/69--52 149/72--49 150/71--50

## 2011-05-23 ENCOUNTER — Ambulatory Visit (INDEPENDENT_AMBULATORY_CARE_PROVIDER_SITE_OTHER): Payer: Medicare Other | Admitting: Internal Medicine

## 2011-05-23 ENCOUNTER — Encounter: Payer: Self-pay | Admitting: Internal Medicine

## 2011-05-23 ENCOUNTER — Ambulatory Visit: Payer: Medicare Other | Admitting: Internal Medicine

## 2011-05-23 DIAGNOSIS — R002 Palpitations: Secondary | ICD-10-CM | POA: Insufficient documentation

## 2011-05-23 DIAGNOSIS — N289 Disorder of kidney and ureter, unspecified: Secondary | ICD-10-CM

## 2011-05-23 DIAGNOSIS — I119 Hypertensive heart disease without heart failure: Secondary | ICD-10-CM

## 2011-05-23 DIAGNOSIS — N189 Chronic kidney disease, unspecified: Secondary | ICD-10-CM

## 2011-05-23 LAB — BASIC METABOLIC PANEL
Calcium: 9.3 mg/dL (ref 8.4–10.5)
GFR: 25.23 mL/min — ABNORMAL LOW (ref >60.00)
Glucose, Bld: 96 mg/dL (ref 70–99)
Potassium: 4.3 mEq/L (ref 3.5–5.1)
Sodium: 135 mEq/L (ref 135–145)

## 2011-05-23 NOTE — Progress Notes (Signed)
  Subjective:    Patient ID: [name], female    DOB: 10-25-1930, 77 y.o.   MRN: [id number]  HPI Pt presents to clinic for evaluation of hypertension. Blood pressure log reviewed with improvement. Most recent blood pressures have been normotensive. Status post increase in sular last visit without adverse effect. Does note recent intermittent palpitations described as skipping. May have associated mild lightheadedness however denies presyncope significant chest pain or shortness of breath. No alleviating or exacerbating factors. Review history chronic renal insufficiency with last creatinine 1.7. No clinical evidence of volume overload currently. No other complaints.  Reviewed past medical history, medications and allergies.  Review of Systems  Respiratory: Negative for cough and shortness of breath.   Cardiovascular: Positive for palpitations. Negative for chest pain.  Neurological: Positive for light-headedness. Negative for seizures and syncope.       Objective:   Physical Exam  Nursing note and vitals reviewed. Constitutional: She appears well-developed and well-nourished. No distress.  HENT:  Head: Normocephalic and atraumatic.  Right Ear: External ear normal.  Left Ear: External ear normal.  Nose: Nose normal.  Eyes: Conjunctivae are normal. Right eye exhibits no discharge. Left eye exhibits no discharge. No scleral icterus.  Neck: Neck supple. No JVD present.  Cardiovascular: Normal rate and regular rhythm.  Exam reveals no gallop and no friction rub.   Murmur heard. Pulmonary/Chest: Effort normal and breath sounds normal. No respiratory distress. She has no wheezes. She has no rales.  Neurological: She is alert.  Skin: Skin is warm and dry. She is not diaphoretic.  Psychiatric: She has a normal mood and affect.          Assessment & Plan:

## 2011-05-23 NOTE — Assessment & Plan Note (Signed)
Stable. No evidence of volume overload clinically. Obtain Chem-7

## 2011-05-23 NOTE — Assessment & Plan Note (Signed)
Improved control status post medication adjustment. Continue current regimen. Monitor blood pressure as an outpatient and followup in clinic as scheduled.

## 2011-05-23 NOTE — Assessment & Plan Note (Signed)
Currently symptomatically. EKG obtained demonstrates bradycardia with a rate of fifty-one. Left bundle-branch block is evident and is stable from EKG obtained two thousand eleven. Discussed Holter monitor but patient currently defers. Followup if symptoms persist or worsen.

## 2011-05-27 ENCOUNTER — Telehealth: Payer: Self-pay

## 2011-05-27 NOTE — Telephone Encounter (Signed)
Pt.notified

## 2011-05-27 NOTE — Telephone Encounter (Signed)
Message copied by Beverely Low on Fri May 27, 2011  3:04 PM ------      Message from: Staci Righter      Created: Thu May 26, 2011  9:00 PM       Kidney test up mildly from last visit. Keep bp under control. Avoid nsaids. Will need chem7 with next visit

## 2011-06-20 ENCOUNTER — Telehealth: Payer: Self-pay | Admitting: Internal Medicine

## 2011-06-20 NOTE — Telephone Encounter (Signed)
This pt was a pt of Dr Marinell Blight is req to est with you as pcp. Pt has Medicare and [employer]. Pls advise if ok.

## 2011-06-20 NOTE — Telephone Encounter (Signed)
OK 

## 2011-06-21 NOTE — Telephone Encounter (Signed)
Lft vm for pt to cb to sch ov with Dr Caryl Never. PCP change approved.

## 2011-06-22 NOTE — Telephone Encounter (Signed)
Pt has been notified that pcp changed has been approved. Pt sch for 08/24/11 at 10:15 am with Dr Caryl Never.

## 2011-07-18 ENCOUNTER — Encounter: Payer: Self-pay | Admitting: Cardiovascular Disease

## 2011-07-26 ENCOUNTER — Ambulatory Visit (HOSPITAL_COMMUNITY)
Admission: RE | Admit: 2011-07-26 | Discharge: 2011-07-26 | Disposition: A | Discharge status: Home / Self Care | Payer: Medicare Other | Source: Ambulatory Visit | Attending: Ophthalmology | Admitting: Ophthalmology

## 2011-07-26 ENCOUNTER — Encounter (HOSPITAL_COMMUNITY)
Admission: RE | Disposition: A | Discharge status: Home / Self Care | Payer: Self-pay | Source: Ambulatory Visit | Attending: Ophthalmology

## 2011-07-26 DIAGNOSIS — H26499 Other secondary cataract, unspecified eye: Secondary | ICD-10-CM | POA: Insufficient documentation

## 2011-07-26 DIAGNOSIS — Z7982 Long term (current) use of aspirin: Secondary | ICD-10-CM | POA: Insufficient documentation

## 2011-07-26 SURGERY — TREATMENT, USING YAG LASER
Anesthesia: LOCAL | Laterality: Left

## 2011-07-26 MED ORDER — TROPICAMIDE 1 % OP SOLN
1.0000 [drp] | OPHTHALMIC | Status: DC | PRN
Start: 2011-07-26 — End: 2011-07-26
  Administered 2011-07-26 (×2): 1 [drp] via OPHTHALMIC

## 2011-07-26 MED ORDER — TROPICAMIDE 1 % OP SOLN
OPHTHALMIC | Status: AC
  Administered 2011-07-26: 1 [drp] via OPHTHALMIC
  Filled 2011-07-26: qty 3

## 2011-07-26 NOTE — Op Note (Signed)
[name] 07/26/2011  Deanna Skyles T. Nile Riggs  Yag Laser Self Test Completedyes. Procedure: Posterior Capsulotomy, left eye.  Eye Protection Worn by Staff yes. Laser In Use Sign on Door yes.  Laser: Nd:YAG Spot Size: Fixed Burst Mode: I Power Setting: 3.6 mJ/burst  Number of shots: 21 Total energy delivered: 73.9 mJ  Patency of the peripheral iridotomy was confirmed visually.  The patient tolerated the procedure without difficulty. No complications were encountered.    The patient was discharged home with the instructions to continue all her current glaucoma medications, if any.   Patient instructed to go to office at 1300 for intraocular pressure check.  Patient verbalizes understanding of discharge instructions yes.

## 2011-07-26 NOTE — H&P (Signed)
No change in H and P 

## 2011-08-08 ENCOUNTER — Other Ambulatory Visit: Payer: Self-pay | Admitting: Internal Medicine

## 2011-08-24 ENCOUNTER — Ambulatory Visit: Payer: Medicare Other | Admitting: Internal Medicine

## 2011-08-24 ENCOUNTER — Ambulatory Visit: Payer: Medicare Other | Admitting: Family Medicine

## 2011-08-31 ENCOUNTER — Encounter: Payer: Self-pay | Admitting: Family Medicine

## 2011-09-01 ENCOUNTER — Ambulatory Visit (INDEPENDENT_AMBULATORY_CARE_PROVIDER_SITE_OTHER): Payer: Medicare Other | Admitting: Family Medicine

## 2011-09-01 ENCOUNTER — Encounter: Payer: Self-pay | Admitting: Family Medicine

## 2011-09-01 VITALS — BP 140/78 | Temp 98.0°F | Wt 131.0 lb

## 2011-09-01 DIAGNOSIS — I10 Essential (primary) hypertension: Secondary | ICD-10-CM

## 2011-09-01 DIAGNOSIS — Z23 Encounter for immunization: Secondary | ICD-10-CM

## 2011-09-01 DIAGNOSIS — N189 Chronic kidney disease, unspecified: Secondary | ICD-10-CM

## 2011-09-01 DIAGNOSIS — G8929 Other chronic pain: Secondary | ICD-10-CM

## 2011-09-01 DIAGNOSIS — I119 Hypertensive heart disease without heart failure: Secondary | ICD-10-CM

## 2011-09-01 NOTE — Progress Notes (Signed)
  Subjective:    Patient ID: [name], female    DOB: 02-14-1930, 77 y.o.   MRN: [id number]  HPI Medical followup. Patient has history of hypertension, aortic stenosis followed by cardiology, osteoporosis, dyslipidemia, and chronic renal insufficiency. She is followed by nephrologist and reportedly had recent labs in August which were stable. Creatinine around 2.0. Medications reviewed. Takes atenolol, Diovan, and nisoldipine for hypertension. Blood pressure well controlled by home readings. No orthostasis. Denies any recent increased dyspnea with exertion. No chest pain. No recent falls.  Pneumovax up-to-date. No flu vaccine yet. Patient has an intermittent chronic low back pain which is stable. She takes hydrocodone rarely for severe pain. No radiculopathy symptoms. No lower extremity weakness   Review of Systems  Constitutional: Negative for fever, chills, appetite change and unexpected weight change.  Respiratory: Negative for cough and shortness of breath.   Cardiovascular: Negative for chest pain, palpitations and leg swelling.  Genitourinary: Negative for dysuria.  Musculoskeletal: Positive for back pain.  Neurological: Negative for dizziness and headaches.  Hematological: Negative for adenopathy.       Objective:   Physical Exam  Constitutional: She is oriented to person, place, and time. She appears well-developed and well-nourished.  HENT:  Mouth/Throat: Oropharynx is clear and moist.  Neck: Neck supple. No thyromegaly present.  Cardiovascular: Normal rate and regular rhythm.   Murmur heard.      Systolic murmur aortic area  Pulmonary/Chest: Effort normal and breath sounds normal. No respiratory distress. She has no wheezes. She has no rales.  Musculoskeletal: She exhibits no edema.  Neurological: She is alert and oriented to person, place, and time. No cranial nerve deficit.          Assessment & Plan:  #1 hypertension. Stable. Continue current  medications #2 chronic renal insufficiency. Check basic metabolic panel and followup in 4 months.  BMP not rechecked today since seen recently by nephrologist. #3 aortic stenosis followed cardiology. No recent change in symptomatology  #4 health maintenance. Flu vaccine given

## 2011-09-22 ENCOUNTER — Ambulatory Visit (INDEPENDENT_AMBULATORY_CARE_PROVIDER_SITE_OTHER): Payer: Medicare Other | Admitting: Cardiovascular Disease

## 2011-09-22 ENCOUNTER — Encounter: Payer: Self-pay | Admitting: Cardiovascular Disease

## 2011-09-22 VITALS — BP 185/77 | HR 59 | Resp 18 | Ht 61.0 in | Wt 127.0 lb

## 2011-09-22 DIAGNOSIS — R0989 Other specified symptoms and signs involving the circulatory and respiratory systems: Secondary | ICD-10-CM

## 2011-09-22 DIAGNOSIS — I119 Hypertensive heart disease without heart failure: Secondary | ICD-10-CM

## 2011-09-22 DIAGNOSIS — I359 Nonrheumatic aortic valve disorder, unspecified: Secondary | ICD-10-CM

## 2011-09-22 NOTE — Patient Instructions (Signed)
Your physician has requested that you have an echocardiogram. Echocardiography is a painless test that uses sound waves to create images of your heart. It provides your doctor with information about the size and shape of your heart and how well your heart's chambers and valves are working. This procedure takes approximately one hour. There are no restrictions for this procedure.  Your physician wants you to follow-up in: 6 MONTHS. You will receive a reminder letter in the mail two months in advance. If you don't receive a letter, please call our office to schedule the follow-up appointment.  Your physician recommends that you continue on your current medications as directed. Please refer to the Current Medication list given to you today.   

## 2011-09-23 ENCOUNTER — Encounter: Payer: Self-pay | Admitting: Cardiovascular Disease

## 2011-09-23 NOTE — Progress Notes (Signed)
HPI:  This is a 77 year old woman presenting for followup evaluation of mixed valvular heart disease. She has aortic stenosis and insufficiency as well as mitral insufficiency. She has mild LV dysfunction with an ejection fraction of 45%. Her left ventricle has not been dilated.  She is doing well from a symptomatic standpoint. She's had some difficulty with blood pressure control. She has a long history of whitecoat hypertension. Her main problem is that on occasion she feels profoundly weak and she associates this with low blood pressures at home. Yesterday this occurred in the setting of a blood pressure of 123/52. She has mild dyspnea with exertion. She reports this is stable. She denies chest pain or pressure. She denies palpitations or leg edema.  Outpatient Encounter Prescriptions as of 09/22/2011  Medication Sig Dispense Refill  . aspirin 81 MG tablet Take 81 mg by mouth 2 (two) times daily.        Marland Kitchen atenolol (TENORMIN) 50 MG tablet Take 50 mg by mouth daily.        Marland Kitchen b complex vitamins tablet Take 1 tablet by mouth daily.        . Biotin (BIOTIN 5000) 5 MG CAPS Take 2 capsules by mouth daily.       . Calcium Carbonate-Vitamin D (CALCIUM 600 + D PO) Take 1 tablet by mouth 2 (two) times daily.       . Cinnamon 500 MG TABS Take 1 tablet by mouth 2 (two) times daily.       . COD LIVER OIL W/VIT A & D PO Take 1 tablet by mouth 2 (two) times daily.       Marland Kitchen DIOVAN 160 MG tablet TAKE 1 TABLET BY MOUTH TWICE A DAY  60 tablet  6  . fish oil-omega-3 fatty acids 1000 MG capsule Take 2 g by mouth daily.        Marland Kitchen HYDROcodone-acetaminophen (VICODIN) 5-500 MG per tablet Take 1 tablet by mouth every 6 (six) hours as needed. For pain       . Krill Oil 300 MG CAPS Take 1 capsule by mouth daily.       . nisoldipine (SULAR) 17 MG 24 hr tablet Take 1 tablet (17 mg total) by mouth daily.  30 tablet  11  . vitamin B-12 (CYANOCOBALAMIN) 250 MCG tablet Take 250 mcg by mouth daily.        . vitamin E 400 UNIT  capsule Take 400 Units by mouth daily.        Marland Kitchen DISCONTD: Fish Oil-Cholecalciferol (OMEGA-3 FISH OIL/VITAMIN D3) 1000-1000 MG-UNIT CAPS Take by mouth.        . DISCONTD: Flaxseed, Linseed, (FLAXSEED OIL) 1000 MG CAPS Take 1 capsule by mouth daily.         Allergies  Allergen Reactions  . Norvasc (Amlodipine Besylate)   . Sulfonamide Derivatives Itching, Swelling and Rash    Past Medical History  Diagnosis Date  . Lower extremity edema     bilateral  . Chronic renal insufficiency   . Labile hypertension   . Aortic insufficiency   . Mitral valve regurgitation   . Anxiety   . Major depressive disorder, single episode, severe   . Failure to thrive   . History of shingles July 2004    on the left side of her thorax and postherpetic neuralgia  . History of psoriasis   . History of recurrent UTIs   . Hx of salpingo-oophorectomy, bilateral     ROS: Negative except as  per HPI  BP 185/77  Pulse 59  Resp 18  Ht 5\' 1"  (1.549 m)  Wt 127 lb (57.607 kg)  BMI 24.00 kg/m2  PHYSICAL EXAM: Pt is alert and oriented, NAD HEENT: normal Neck: JVP - normal, carotids 2+= with bilateral bruits Lungs: CTA bilaterally CV: RRR with grade 3/6 harsh systolic murmur at the right upper sternal border Abd: soft, NT, Positive BS, no hepatomegaly Ext: no C/C/E, distal pulses intact and equal Skin: warm/dry no rash  EKG:  Normal sinus rhythm 58 beats per minute, left bundle branch block.  ASSESSMENT AND PLAN:

## 2011-09-23 NOTE — Assessment & Plan Note (Signed)
The patient has mixed disease predominantly of her aortic valve. Her last echo showed moderate to severe aortic insufficiency although her exam is not consistent with this. I would like to repeat an echocardiogram this year to evaluate her aortic stenosis and insufficiency.

## 2011-09-23 NOTE — Assessment & Plan Note (Signed)
The patient has a loud right carotid bruit. She was evaluated with a carotid ultrasound in 2011 and this showed no significant carotid disease. This must represent radiation of her cardiac murmur.

## 2011-09-23 NOTE — Assessment & Plan Note (Signed)
Home blood pressures have been in a good range. The patient does have chronic kidney disease. I reviewed her creatinine and her most recent creatinine check from July 24 was 1.6 mg per deciliter. This is stable. No medication adjustments were made today. Note she did develop significant swelling with amlodipine but she has done well with Sular.

## 2011-10-10 ENCOUNTER — Ambulatory Visit (HOSPITAL_COMMUNITY): Payer: Medicare Other | Attending: Cardiovascular Disease | Admitting: Radiology

## 2011-10-10 DIAGNOSIS — I359 Nonrheumatic aortic valve disorder, unspecified: Secondary | ICD-10-CM | POA: Insufficient documentation

## 2011-10-10 DIAGNOSIS — I079 Rheumatic tricuspid valve disease, unspecified: Secondary | ICD-10-CM | POA: Insufficient documentation

## 2011-11-11 ENCOUNTER — Other Ambulatory Visit: Payer: Self-pay | Admitting: Family Medicine

## 2012-01-02 ENCOUNTER — Ambulatory Visit: Payer: Medicare Other | Admitting: Family Medicine

## 2012-01-04 ENCOUNTER — Encounter: Payer: Self-pay | Admitting: Family Medicine

## 2012-01-04 ENCOUNTER — Ambulatory Visit (INDEPENDENT_AMBULATORY_CARE_PROVIDER_SITE_OTHER): Payer: Medicare Other | Admitting: Family Medicine

## 2012-01-04 DIAGNOSIS — I359 Nonrheumatic aortic valve disorder, unspecified: Secondary | ICD-10-CM

## 2012-01-04 DIAGNOSIS — I119 Hypertensive heart disease without heart failure: Secondary | ICD-10-CM

## 2012-01-04 DIAGNOSIS — N189 Chronic kidney disease, unspecified: Secondary | ICD-10-CM

## 2012-01-04 NOTE — Patient Instructions (Signed)
Bring your home blood pressure cuff with you at follow up.

## 2012-01-04 NOTE — Progress Notes (Signed)
  Subjective:    Patient ID: [name], female    DOB: 07/05/1930, 78 y.o.   MRN: [id number]  HPI  Medical followup. She has history of aortic stenosis, hypertension, chronic kidney disease, hyperlipidemia, and osteoporosis. Medications reviewed. Compliant with all. Denies side effects. Home blood pressures fairly well controlled around 130-140 systolic. She denies any headaches, dizziness, chest pain, or dyspnea. No progressive peripheral edema. She continues to see a nephrologist. She has previously been on amlodipine but had problems with edema. She is complaining of cost with Diovan and Sular. Her current blood pressure medications include Diovan 160 mg twice daily, atenolol 50 mg daily, and Sular once daily.  Past Medical History  Diagnosis Date  . Lower extremity edema     bilateral  . Chronic renal insufficiency   . Labile hypertension   . Aortic insufficiency   . Mitral valve regurgitation   . Anxiety   . Major depressive disorder, single episode, severe   . Failure to thrive   . History of shingles July 2004    on the left side of her thorax and postherpetic neuralgia  . History of psoriasis   . History of recurrent UTIs   . Hx of salpingo-oophorectomy, bilateral    Past Surgical History  Procedure Date  . Salpingoophorectomy   . Appendectomy   . Breast cyst excision     reports that she quit smoking about 32 years ago. Her smoking use included Cigarettes. She quit after 30 years of use. She does not have any smokeless tobacco history on file. She reports that she drinks alcohol. She reports that she does not use illicit drugs. family history includes Cancer in an unspecified family member; Diabetes in an unspecified family member; and Lung cancer in an unspecified family member. Allergies  Allergen Reactions  . Norvasc (Amlodipine Besylate)   . Sulfonamide Derivatives Itching, Swelling and Rash      Review of Systems  Constitutional: Negative for fever,  appetite change and fatigue.  Eyes: Negative for visual disturbance.  Respiratory: Negative for cough and shortness of breath.   Cardiovascular: Negative for chest pain and palpitations.  Gastrointestinal: Negative for diarrhea and constipation.  Musculoskeletal: Negative for myalgias, back pain and arthralgias.  Skin: Negative for rash.  Neurological: Negative for dizziness, syncope and headaches.  Hematological: Negative for adenopathy.  Psychiatric/Behavioral: Negative for confusion and dysphoric mood.       Objective:   Physical Exam  Constitutional: She is oriented to person, place, and time. She appears well-developed and well-nourished.  HENT:  Mouth/Throat: Oropharynx is clear and moist.  Neck: Neck supple. No thyromegaly present.  Cardiovascular: Normal rate and regular rhythm.   Pulmonary/Chest: Effort normal and breath sounds normal. No respiratory distress. She has no wheezes. She has no rales.  Musculoskeletal: She exhibits no edema.  Lymphadenopathy:    She has no cervical adenopathy.  Neurological: She is alert and oriented to person, place, and time.          Assessment & Plan:  #1 hypertension suboptimally controlled on today's visit. Better control by home readings. She is encouraged to followup in one month and bring her home blood pressure cuff and readings at that time to review #2 history of aortic stenosis with recent repeat echocardiogram stable #3 chronic kidney disease followed by nephrology  #4 history of hyperlipidemia

## 2012-01-31 ENCOUNTER — Other Ambulatory Visit: Payer: Self-pay | Admitting: Family Medicine

## 2012-02-01 ENCOUNTER — Ambulatory Visit: Payer: Medicare Other | Admitting: Family Medicine

## 2012-02-09 ENCOUNTER — Ambulatory Visit (INDEPENDENT_AMBULATORY_CARE_PROVIDER_SITE_OTHER): Payer: Medicare Other | Admitting: Family Medicine

## 2012-02-09 ENCOUNTER — Encounter: Payer: Self-pay | Admitting: Family Medicine

## 2012-02-09 VITALS — BP 160/88 | Temp 98.1°F | Wt 136.0 lb

## 2012-02-09 DIAGNOSIS — R0609 Other forms of dyspnea: Secondary | ICD-10-CM

## 2012-02-09 DIAGNOSIS — I119 Hypertensive heart disease without heart failure: Secondary | ICD-10-CM

## 2012-02-09 DIAGNOSIS — N189 Chronic kidney disease, unspecified: Secondary | ICD-10-CM

## 2012-02-09 DIAGNOSIS — R0600 Dyspnea, unspecified: Secondary | ICD-10-CM

## 2012-02-09 LAB — BASIC METABOLIC PANEL
BUN: 27 mg/dL — ABNORMAL HIGH (ref 6–23)
Calcium: 9.3 mg/dL (ref 8.4–10.5)
Creatinine, Ser: 2 mg/dL — ABNORMAL HIGH (ref 0.4–1.2)
GFR: 26.23 mL/min — ABNORMAL LOW (ref >60.00)
Glucose, Bld: 104 mg/dL — ABNORMAL HIGH (ref 70–99)
Potassium: 4.4 mEq/L (ref 3.5–5.1)

## 2012-02-09 LAB — BRAIN NATRIURETIC PEPTIDE: Pro B Natriuretic peptide (BNP): 318 pg/mL — ABNORMAL HIGH (ref 0.0–100.0)

## 2012-02-09 NOTE — Progress Notes (Signed)
Subjective:    Patient ID: [name], female    DOB: [birth year], 78 y.o.   MRN: [id number]  HPI  Patient seen for medical followup. Her chronic problems are hypertension, hyperlipidemia, chronic kidney disease, osteoporosis. She had elevated blood pressure last month but states blood pressures consistently fairly well controlled at home. She brings in her cuff today to compare with ours. Still consistently getting about 140 or less systolic with frequent systolic readings in the 120s. She is reluctant to add further medications. She is compliant with her usual medications. She has followup with nephrologist in July.  She had echocardiogram November 2012 ejection fracture of 50%. Mild LVH. Moderate aortic stenosis. She complains of some progressive dyspnea with activity past 3-4 months. No chest pain. Intermittent dry cough. No hemoptysis. No appetite or weight changes. No postnasal drip symptoms. No GERD symptoms. Ex-smoker. 40-pack-year history. No reported history of COPD.  Past Medical History  Diagnosis Date  . Lower extremity edema     bilateral  . Chronic renal insufficiency   . Labile hypertension   . Aortic insufficiency   . Mitral valve regurgitation   . Anxiety   . Major depressive disorder, single episode, severe   . Failure to thrive   . History of shingles July 2004    on the left side of her thorax and postherpetic neuralgia  . History of psoriasis   . History of recurrent UTIs   . Hx of salpingo-oophorectomy, bilateral    Past Surgical History  Procedure Date  . Salpingoophorectomy   . Appendectomy   . Breast cyst excision     reports that she quit smoking about 32 years ago. Her smoking use included Cigarettes. She quit after 30 years of use. She does not have any smokeless tobacco history on file. She reports that she drinks alcohol. She reports that she does not use illicit drugs. family history includes Cancer in an unspecified family member; Diabetes in  an unspecified family member; and Lung cancer in an unspecified family member. Allergies  Allergen Reactions  . Norvasc (Amlodipine Besylate)   . Sulfonamide Derivatives Itching, Swelling and Rash      Review of Systems  Constitutional: Positive for fatigue. Negative for fever, chills, appetite change and unexpected weight change.  Respiratory: Positive for cough and shortness of breath. Negative for wheezing.   Cardiovascular: Negative for chest pain, palpitations and leg swelling.  Gastrointestinal: Negative for nausea, vomiting and abdominal pain.  Genitourinary: Negative for dysuria.  Neurological: Negative for dizziness and syncope.  Hematological: Negative for adenopathy.       Objective:   Physical Exam  Constitutional: She is oriented to person, place, and time. She appears well-developed and well-nourished.  HENT:  Mouth/Throat: Oropharynx is clear and moist.  Neck: Neck supple. No thyromegaly present.  Cardiovascular: Normal rate and regular rhythm.   Pulmonary/Chest: Effort normal and breath sounds normal. No respiratory distress. She has no wheezes. She has no rales. She exhibits no tenderness.  Musculoskeletal: She exhibits no edema.  Lymphadenopathy:    She has no cervical adenopathy.  Neurological: She is alert and oriented to person, place, and time.          Assessment & Plan:  #1 hypertension. We obtained identical readings today with our cuff and hers. She has substantially elevated blood pressure here in the office today but consistently well controlled at home. #2 chronic kidney disease. Recheck basic metabolic panel #3 dyspnea with exertion. Has had a concomitant dry cough.  Check chest x-ray. Check BNP levels. Doubt heart failure. She has known aortic stenosis moderate by recent echocardiogram.  ?deconditioning.  Consider refer back to cardiology if above unrevealing.

## 2012-02-10 NOTE — Progress Notes (Signed)
Quick Note:  Pt informed ______ 

## 2012-02-14 ENCOUNTER — Telehealth: Payer: Self-pay | Admitting: Family Medicine

## 2012-02-14 ENCOUNTER — Ambulatory Visit (INDEPENDENT_AMBULATORY_CARE_PROVIDER_SITE_OTHER)
Admission: RE | Admit: 2012-02-14 | Discharge: 2012-02-14 | Disposition: A | Discharge status: Home / Self Care | Payer: Medicare Other | Source: Ambulatory Visit | Attending: Family Medicine | Admitting: Family Medicine

## 2012-02-14 ENCOUNTER — Telehealth: Payer: Self-pay

## 2012-02-14 DIAGNOSIS — R0600 Dyspnea, unspecified: Secondary | ICD-10-CM

## 2012-02-14 DIAGNOSIS — R0609 Other forms of dyspnea: Secondary | ICD-10-CM

## 2012-02-14 NOTE — Telephone Encounter (Signed)
Report placed on dr. Sherlie Ban desk

## 2012-02-14 NOTE — Telephone Encounter (Signed)
[facility] wanted you to know that this patient never came in to get the films you ordered on 3/13.

## 2012-02-14 NOTE — Telephone Encounter (Signed)
recv'd call from [facility] radiology - "lenghty report" in chart - christy was asked to call- printed and given to dr. Amador Cunas in dr burchette's absence from office at this time -

## 2012-02-14 NOTE — Telephone Encounter (Signed)
I spoke with pt, [name] also called pt, she will go for X-Ray today as she needed her daughter to bring her.

## 2012-03-08 ENCOUNTER — Other Ambulatory Visit: Payer: Self-pay | Admitting: Family Medicine

## 2012-03-21 ENCOUNTER — Other Ambulatory Visit: Payer: Self-pay | Admitting: Family Medicine

## 2012-04-09 ENCOUNTER — Other Ambulatory Visit: Payer: Self-pay | Admitting: Family Medicine

## 2012-04-27 ENCOUNTER — Encounter: Payer: Self-pay | Admitting: Cardiovascular Disease

## 2012-04-27 ENCOUNTER — Ambulatory Visit (INDEPENDENT_AMBULATORY_CARE_PROVIDER_SITE_OTHER): Payer: Medicare Other | Admitting: Cardiovascular Disease

## 2012-04-27 VITALS — BP 176/86 | HR 59 | Ht 62.0 in | Wt 133.8 lb

## 2012-04-27 DIAGNOSIS — I359 Nonrheumatic aortic valve disorder, unspecified: Secondary | ICD-10-CM

## 2012-04-27 MED ORDER — METOPROLOL SUCCINATE ER 50 MG PO TB24: 50.0000 mg | ORAL_TABLET | Freq: Every day | ORAL | Status: DC

## 2012-04-27 NOTE — Patient Instructions (Signed)
Your physician has recommended you make the following change in your medication: STOP Atenolol, START Metoprolol Succinate 50mg  once a day  Your physician wants you to follow-up in: 1 YEAR.  You will receive a reminder letter in the mail two months in advance. If you don't receive a letter, please call our office to schedule the follow-up appointment.

## 2012-04-27 NOTE — Progress Notes (Signed)
HPI:  78 year old woman presenting for followup evaluation. She is followed for mixed aortic valvular disease with moderate aortic stenosis and insufficiency. The patient is followed by Dr. Hyman Hopes for chronic kidney disease. She has long-standing hypertension with some degree of blood pressure lability. Her blood pressure this morning was 124/57 with a heart rate of 51.  She remains physically active. She does yard work on a routine basis. Her 68 year old great grandson lives with her. He is disabled and she takes care of him.  The patient denies chest pain or pressure. She denies dyspnea, lightheadedness, or syncope. She's had no palpitations, orthopnea, or PND. She does admit to lower extremity swelling. She has no other complaints. She's compliant with her medications.  Outpatient Encounter Prescriptions as of 04/27/2012  Medication Sig Dispense Refill  . aspirin 81 MG tablet Take 81 mg by mouth 2 (two) times daily.        Marland Kitchen b complex vitamins tablet Take 1 tablet by mouth daily.        . Calcium Carbonate-Vitamin D (CALCIUM 600 + D PO) Take 1 tablet by mouth 2 (two) times daily.       . Cinnamon 500 MG TABS Take 1 tablet by mouth 2 (two) times daily.       . COD LIVER OIL W/VIT A & D PO Take 1 tablet by mouth 2 (two) times daily.       Marland Kitchen DIOVAN 160 MG tablet TAKE 1 TABLET BY MOUTH TWICE A DAY  60 tablet  2  . fexofenadine (ALLEGRA) 180 MG tablet Take 180 mg by mouth daily. One half tablet when patient goes outside      . fish oil-omega-3 fatty acids 1000 MG capsule Take 2 g by mouth daily.        Marland Kitchen HYDROcodone-acetaminophen (VICODIN) 5-500 MG per tablet Take 1 tablet by mouth every 6 (six) hours as needed. For pain       . Krill Oil 300 MG CAPS Take 1 capsule by mouth daily.       . nisoldipine (SULAR) 17 MG 24 hr tablet TAKE 1 TABLET EVERY DAY  30 tablet  11  . vitamin B-12 (CYANOCOBALAMIN) 250 MCG tablet Take 250 mcg by mouth daily.        . vitamin E 400 UNIT capsule Take 400 Units by  mouth daily.        Marland Kitchen DISCONTD: atenolol (TENORMIN) 50 MG tablet Take 50 mg by mouth daily.       . metoprolol succinate (TOPROL-XL) 50 MG 24 hr tablet Take 1 tablet (50 mg total) by mouth daily. Take with or immediately following a meal.  90 tablet  3  . DISCONTD: atenolol (TENORMIN) 50 MG tablet TAKE 1&1/2 TABLET EVERY DAY  45 tablet  11    Allergies  Allergen Reactions  . Norvasc (Amlodipine Besylate)   . Sulfonamide Derivatives Itching, Swelling and Rash    Past Medical History  Diagnosis Date  . Lower extremity edema     bilateral  . Chronic renal insufficiency   . Labile hypertension   . Aortic insufficiency   . Mitral valve regurgitation   . Anxiety   . Major depressive disorder, single episode, severe   . Failure to thrive in childhood   . History of shingles July 2004    on the left side of her thorax and postherpetic neuralgia  . History of psoriasis   . History of recurrent UTIs   . Hx of salpingo-oophorectomy,  bilateral     ROS: Negative except as per HPI  BP 176/86  Pulse 59  Ht 5\' 2"  (1.575 m)  Wt 60.691 kg (133 lb 12.8 oz)  BMI 24.47 kg/m2  PHYSICAL EXAM: Pt is alert and oriented, pleasant elderly woman in NAD HEENT: normal Neck: JVP - normal, carotids 2+= with bilateral bruits right greater than left Lungs: CTA bilaterally CV: RRR with grade 3/6 harsh systolic murmur at the right upper sternal border, faint diastolic decrescendo murmur. Abd: soft, NT, Positive BS, no hepatomegaly Ext: Trace pretibial edema bilaterally, distal pulses intact and equal Skin: warm/dry no rash  EKG:  Sinus rhythm 59 beats per minute, left bundle branch block.  2D ECHO:(10/10/2011)  Impressions:  - Normal LV size with mild LV hypertrophy. EF estimated to be about 50%. Septal-lateral dyssynchrony consistent with LBBB. LBBB may be making the EF appear falsely low. Moderately calcified aortic valve with probably moderate aortic stenosis and moderate aortic insufficiency.  Mild pulmonary hypertension. Normal RV size and systolic function.  ASSESSMENT AND PLAN: 1. Mixed aortic valve disease. The patient has no symptoms attributable to aortic stenosis. I have recommended a followup echocardiogram next year. I will see her back in 12 months for followup.  2. Hypertension with chronic kidney disease. The patient is followed by Dr. Hyman Hopes. I have taken the liberty of changing her beta blocker from atenolol to metoprolol succinate considering her renal dysfunction.  Tonny Bollman 04/27/2012 4:20 PM

## 2012-05-11 ENCOUNTER — Ambulatory Visit (INDEPENDENT_AMBULATORY_CARE_PROVIDER_SITE_OTHER): Payer: Medicare Other | Admitting: Family Medicine

## 2012-05-11 VITALS — BP 160/70 | HR 64 | Temp 98.0°F | Wt 137.0 lb

## 2012-05-11 DIAGNOSIS — I119 Hypertensive heart disease without heart failure: Secondary | ICD-10-CM

## 2012-05-11 MED ORDER — HYDROCODONE-ACETAMINOPHEN 5-500 MG PO TABS: 1.0000 | ORAL_TABLET | Freq: Four times a day (QID) | ORAL | Status: DC | PRN

## 2012-05-11 NOTE — Patient Instructions (Signed)

## 2012-05-11 NOTE — Progress Notes (Signed)
  Subjective:    Patient ID: [name], female    DOB: March 24, 1930, 78 y.o.   MRN: [id number]  HPI  Patient seen for medical followup. She has history of chronic kidney disease, hypertension, hyperlipidemia, aortic stenosis. Followed by nephrologist and cardiologist. Recent change from atenolol to metoprolol. She has probable white coat syndrome. Blood pressures consistently at home 120s to 140s systolic but here today 160/70. No headaches. No dizziness. Compliant with all medications. No chest pain.  Past Medical History  Diagnosis Date  . Lower extremity edema     bilateral  . Chronic renal insufficiency   . Labile hypertension   . Aortic insufficiency   . Mitral valve regurgitation   . Anxiety   . Major depressive disorder, single episode, severe   . Failure to thrive in childhood   . History of shingles July 2004    on the left side of her thorax and postherpetic neuralgia  . History of psoriasis   . History of recurrent UTIs   . Hx of salpingo-oophorectomy, bilateral    Past Surgical History  Procedure Date  . Salpingoophorectomy   . Appendectomy   . Breast cyst excision     reports that she quit smoking about 32 years ago. Her smoking use included Cigarettes. She quit after 30 years of use. She does not have any smokeless tobacco history on file. She reports that she drinks alcohol. She reports that she does not use illicit drugs. family history includes Cancer in an unspecified family member; Diabetes in an unspecified family member; and Lung cancer in an unspecified family member. Allergies  Allergen Reactions  . Norvasc (Amlodipine Besylate)   . Sulfonamide Derivatives Itching, Swelling and Rash      Review of Systems  Constitutional: Negative for fatigue.  Eyes: Negative for visual disturbance.  Respiratory: Negative for cough, chest tightness, shortness of breath and wheezing.   Cardiovascular: Negative for chest pain, palpitations and leg swelling.    Neurological: Negative for dizziness, seizures, syncope, weakness, light-headedness and headaches.       Objective:   Physical Exam  Constitutional: She is oriented to person, place, and time. She appears well-developed and well-nourished.  Neck: Neck supple. No thyromegaly present.  Cardiovascular: Normal rate and regular rhythm.   Murmur heard. Pulmonary/Chest: Effort normal and breath sounds normal. No respiratory distress. She has no wheezes. She has no rales.  Musculoskeletal: She exhibits no edema.  Neurological: She is alert and oriented to person, place, and time.          Assessment & Plan:  Hypertension. Elevation in office but controlled consistently by home readings. She has brought in home cuff and we've gotten consistent readings. Probable whitecoat syndrome. Continue current medications. Patient requested refill hydrocodone which she rarely uses for arthritic complaints. #30 with 1 refill given

## 2012-05-22 ENCOUNTER — Emergency Department (HOSPITAL_COMMUNITY)
Admission: EM | Admit: 2012-05-22 | Discharge: 2012-05-22 | Disposition: A | Discharge status: Home / Self Care | Payer: Medicare Other | Attending: Emergency Medicine | Admitting: Emergency Medicine

## 2012-05-22 ENCOUNTER — Encounter (HOSPITAL_COMMUNITY): Payer: Self-pay | Admitting: *Deleted

## 2012-05-22 DIAGNOSIS — W57XXXA Bitten or stung by nonvenomous insect and other nonvenomous arthropods, initial encounter: Secondary | ICD-10-CM | POA: Insufficient documentation

## 2012-05-22 DIAGNOSIS — N189 Chronic kidney disease, unspecified: Secondary | ICD-10-CM | POA: Insufficient documentation

## 2012-05-22 DIAGNOSIS — F411 Generalized anxiety disorder: Secondary | ICD-10-CM | POA: Insufficient documentation

## 2012-05-22 DIAGNOSIS — Z87440 Personal history of urinary (tract) infections: Secondary | ICD-10-CM | POA: Insufficient documentation

## 2012-05-22 DIAGNOSIS — Z87891 Personal history of nicotine dependence: Secondary | ICD-10-CM | POA: Insufficient documentation

## 2012-05-22 DIAGNOSIS — S80869A Insect bite (nonvenomous), unspecified lower leg, initial encounter: Secondary | ICD-10-CM

## 2012-05-22 DIAGNOSIS — I080 Rheumatic disorders of both mitral and aortic valves: Secondary | ICD-10-CM | POA: Insufficient documentation

## 2012-05-22 DIAGNOSIS — I129 Hypertensive chronic kidney disease with stage 1 through stage 4 chronic kidney disease, or unspecified chronic kidney disease: Secondary | ICD-10-CM | POA: Insufficient documentation

## 2012-05-22 DIAGNOSIS — F329 Major depressive disorder, single episode, unspecified: Secondary | ICD-10-CM | POA: Insufficient documentation

## 2012-05-22 DIAGNOSIS — F3289 Other specified depressive episodes: Secondary | ICD-10-CM | POA: Insufficient documentation

## 2012-05-22 DIAGNOSIS — S90569A Insect bite (nonvenomous), unspecified ankle, initial encounter: Secondary | ICD-10-CM | POA: Insufficient documentation

## 2012-05-22 MED ORDER — DOXYCYCLINE HYCLATE 100 MG PO CAPS: 100.0000 mg | ORAL_CAPSULE | Freq: Two times a day (BID) | ORAL | Status: AC

## 2012-05-22 NOTE — Discharge Instructions (Signed)
Please cleanse the wound with soap and water daily. Doxycycline daily after a meal. Return if signs of infection.Wood Tick Bite Ticks are insects that attach themselves to the skin. Most tick bites are harmless, but sometimes ticks carry diseases that can make a person quite ill. The chance of getting ill depends on:  The kind of tick that bites you.   Time of year.   How long the tick is attached.   Geographic location.  Wood ticks are also called dog ticks. They are generally black. They can have white markings. They live in shrubs and grassy areas. They are larger than deer ticks. Wood ticks are about the size of a watermelon seed. They have a hard body. The most common places for ticks to attach themselves are the scalp, neck, armpits, waist, and groin. Wood ticks may stay attached for up to 2 weeks. TICKS MUST BE REMOVED AS SOON AS POSSIBLE TO HELP PREVENT DISEASES CAUSED BY TICK BITES.  TO REMOVE A TICK: 1. If available, put on latex gloves before trying to remove a tick.  2. Grasp the tick as close to the skin as possible, with curved forceps, fine tweezers or a special tick removal tool.  3. Pull gently with steady pressure until the tick lets go. Do not twist the tick or jerk it suddenly. This may break off the tick's head or mouth parts.  4. Do not crush the tick's body. This could force disease-carrying fluids from the tick into your body.  5. After the tick is removed, wash the bite area and your hands with soap and water or other disinfectant.  6. Apply a small amount of antiseptic cream or ointment to the bite site.  7. Wash and disinfect any instruments that were used.  8. Save the tick in a jar or plastic bag for later identification. Preserve the tick with a bit of alcohol or put it in the freezer.  9. Do not apply a hot match, petroleum jelly, or fingernail polish to the tick. This does not work and may increase the chances of disease from the tick bite.  YOU MAY NEED TO SEE  YOUR CAREGIVER FOR A TETANUS SHOT NOW IF:  You have no idea when you had the last one.   You have never had a tetanus shot before.  If you need a tetanus shot, and you decide not to get one, there is a rare chance of getting tetanus. Sickness from tetanus can be serious. If you get a tetanus shot, your arm may swell, get red and warm to the touch at the shot site. This is common and not a problem. PREVENTION  Wear protective clothing. Long sleeves and pants are best.   Wear white clothes to see ticks more easily   Tuck your pant legs into your socks.   If walking on trail, stay in the middle of the trail to avoid brushing against bushes.   Put insect repellent on all exposed skin and along boot tops, pant legs and sleeve cuffs   Check clothing, hair and skin repeatedly and before coming inside.   Brush off any ticks that are not attached.  SEEK MEDICAL CARE IF:   You cannot remove a tick or part of the tick that is left in the skin.   Unexplained fever.   Redness and swelling in the area of the tick bite.   Tender, swollen lymph glands.   Diarrhea.   Weight loss.   Cough.  Fatigue.   Muscle, joint or bone pain.   Belly pain.   Headache.   Rash.  SEEK IMMEDIATE MEDICAL CARE IF:   You develop an oral temperature above 102 F (38.9 C).   You are having trouble walking or moving your legs.   Numbness in the legs.   Shortness of breath.   Confusion.   Repeated vomiting.  Document Released: 11/11/2000 Document Revised: 11/03/2011 Document Reviewed: 10/20/2008 [facility] Patient Information 2012 [place], [place].

## 2012-05-22 NOTE — ED Notes (Signed)
Pt presents to er with / tick bite to left lower leg, pt states that she had been outside today, noticed that she had a brown spot on her leg, took her fingernail and grabbed the ?tick off, pt concerned that the head may still be attached.

## 2012-05-22 NOTE — ED Provider Notes (Signed)
History     CSN: [id number]  Arrival date & time 05/22/12  1032   First MD Initiated Contact with Patient 05/22/12 1103      Chief Complaint  Patient presents with  . Insect Bite    (Consider location/radiation/quality/duration/timing/severity/associated sxs/prior treatment) HPI Comments: Patient states she sustained a tick bite to the left lower leg that earlier today. She initially tried to remove the tick with conservative measures this did not work. She then used her fingernail to grab the tick and was able to remove it. The patient presents to the emergency department because she is concerned she may have left" parts of him" in the wound area. She has not had any fever or chills, has been no nausea vomiting, and no red streaking of the left lower leg.  The history is provided by the patient.    Past Medical History  Diagnosis Date  . Lower extremity edema     bilateral  . Chronic renal insufficiency   . Labile hypertension   . Aortic insufficiency   . Mitral valve regurgitation   . Anxiety   . Major depressive disorder, single episode, severe   . Failure to thrive in childhood   . History of shingles July 2004    on the left side of her thorax and postherpetic neuralgia  . History of psoriasis   . History of recurrent UTIs   . Hx of salpingo-oophorectomy, bilateral     Past Surgical History  Procedure Date  . Salpingoophorectomy   . Appendectomy   . Breast cyst excision     Family History  Problem Relation Age of Onset  . Diabetes    . Cancer    . Lung cancer      History  Substance Use Topics  . Smoking status: Former Smoker -- 30 years    Types: Cigarettes    Quit date: 09/01/1979  . Smokeless tobacco: Not on file  . Alcohol Use: Yes     occassional once or twice a year.    OB History    Grav Para Term Preterm Abortions TAB SAB Ect Mult Living                  Review of Systems  Cardiovascular:       Heart murmurs  Musculoskeletal:  Positive for myalgias.  Psychiatric/Behavioral:       Depression    Allergies  Metoprolol; Norvasc; and Sulfonamide derivatives  Home Medications   Current Outpatient Rx  Name Route Sig Dispense Refill  . ASPIRIN EC 81 MG PO TBEC Oral Take 81 mg by mouth 2 (two) times daily.    . ATENOLOL 50 MG PO TABS Oral Take 50 mg by mouth daily.     . B COMPLEX PO TABS Oral Take 1 tablet by mouth daily.      Marland Kitchen CALCIUM 600 + D PO Oral Take 1 tablet by mouth 2 (two) times daily.     Marland Kitchen VITAMIN D 1000 UNITS PO TABS Oral Take 1,000 Units by mouth daily.    Marland Kitchen CINNAMON 500 MG PO TABS Oral Take 1 tablet by mouth 2 (two) times daily.     . COD LIVER OIL W/VIT A & D PO Oral Take 1 tablet by mouth 2 (two) times daily.     Marland Kitchen DIOVAN 160 MG PO TABS  TAKE 1 TABLET BY MOUTH TWICE A DAY 60 tablet 2  . OMEGA-3 FATTY ACIDS 1000 MG PO CAPS Oral Take 2 g  by mouth daily.      Marland Kitchen HYDROCODONE-ACETAMINOPHEN 5-500 MG PO TABS Oral Take 1 tablet by mouth every 6 (six) hours as needed. For pain 30 tablet 1  . KRILL OIL 300 MG PO CAPS Oral Take 1 capsule by mouth daily.     Marland Kitchen NISOLDIPINE ER 17 MG PO TB24  TAKE 1 TABLET EVERY DAY 30 tablet 11  . VITAMIN E 400 UNITS PO CAPS Oral Take 400 Units by mouth daily.      Marland Kitchen DOXYCYCLINE HYCLATE 100 MG PO CAPS Oral Take 1 capsule (100 mg total) by mouth 2 (two) times daily after a meal. 10 capsule 0    BP 189/61  Pulse 60  Temp 97 F (36.1 C)  Resp 20  SpO2 98%  Physical Exam  Nursing note and vitals reviewed. Constitutional: She is oriented to person, place, and time. She appears well-developed and well-nourished.  Non-toxic appearance.  HENT:  Head: Normocephalic.  Right Ear: Tympanic membrane and external ear normal.  Left Ear: Tympanic membrane and external ear normal.  Eyes: EOM and lids are normal. Pupils are equal, round, and reactive to light.  Neck: Normal range of motion. Neck supple. Carotid bruit is not present.  Cardiovascular: Normal rate, regular rhythm, intact  distal pulses and normal pulses.   Murmur heard. Pulmonary/Chest: Breath sounds normal. No respiratory distress.  Abdominal: Soft. Bowel sounds are normal. There is no tenderness. There is no guarding.  Musculoskeletal: Normal range of motion.       There is a small area of slight increased redness of the posterior left lower leg. The area was examined under magnification and there is no foreign body appreciated in the skin. There is currently no drainage or red streaking noted.  Lymphadenopathy:       Head (right side): No submandibular adenopathy present.       Head (left side): No submandibular adenopathy present.    She has no cervical adenopathy.  Neurological: She is alert and oriented to person, place, and time. She has normal strength. No cranial nerve deficit or sensory deficit.  Skin: Skin is warm and dry.  Psychiatric: She has a normal mood and affect. Her speech is normal.    ED Course  Procedures (including critical care time)  Labs Reviewed - No data to display No results found.   1. Tick bite of lower leg       MDM  I have reviewed nursing notes, vital signs, and all appropriate lab and imaging results for this patient.  Patient advised that there were no body parts or foreign body noted in the skin of the left leg. At her request prescription for doxycycline 1 twice a day was given. Patient advised to see her primary physician or return immediately to the emergency department if any chills, fever, or signs of infection.      Kathie Dike, [place] 05/22/12 1149

## 2012-05-23 NOTE — ED Provider Notes (Signed)
Medical screening examination/treatment/procedure(s) were performed by non-physician practitioner and as supervising physician I was immediately available for consultation/collaboration.   Jaquila Santelli L Marjarie Irion, MD 05/23/12 0824 

## 2012-06-04 ENCOUNTER — Telehealth: Payer: Self-pay | Admitting: Family Medicine

## 2012-06-04 NOTE — Telephone Encounter (Signed)
I called pt home, no answer so a message was left to call Dr Earmon Phoenix office to inform that office and get directives.  Do you agree or have any other recommendation.

## 2012-06-04 NOTE — Telephone Encounter (Signed)
Caller: [name]/Patient is calling with a question about Atenolol.The medication was written by Evelena Peat. Atenolol initially  stopped on 05/14/12 by cardiologist, Dr Excell Seltzer, d/t significant lower leg edema.  Started on Metoprolol 05/14/12; edema became worse.   Stopped Metoprolol and resumed Atenolol on her own; reports edema resolved.  [facility] [phone]- [phone].  Info noted and sent to  [facility] for adult with questions about RX medication not covered by available resources per Med Question Call Guideline.

## 2012-06-06 ENCOUNTER — Other Ambulatory Visit: Payer: Self-pay

## 2012-06-06 ENCOUNTER — Encounter (HOSPITAL_COMMUNITY): Payer: Self-pay | Admitting: General Practice

## 2012-06-06 ENCOUNTER — Encounter: Payer: Self-pay | Admitting: Cardiovascular Disease

## 2012-06-06 ENCOUNTER — Inpatient Hospital Stay (HOSPITAL_COMMUNITY): Payer: Medicare Other

## 2012-06-06 ENCOUNTER — Ambulatory Visit (INDEPENDENT_AMBULATORY_CARE_PROVIDER_SITE_OTHER): Payer: Medicare Other | Admitting: Cardiovascular Disease

## 2012-06-06 ENCOUNTER — Inpatient Hospital Stay (HOSPITAL_COMMUNITY)
Admission: AD | Admit: 2012-06-06 | Discharge: 2012-06-08 | DRG: 247 | Disposition: A | Discharge status: Home / Self Care | Payer: Medicare Other | Source: Ambulatory Visit | Attending: Cardiovascular Disease | Admitting: Cardiovascular Disease

## 2012-06-06 VITALS — BP 193/79 | HR 57 | Ht 62.0 in | Wt 133.1 lb

## 2012-06-06 DIAGNOSIS — I200 Unstable angina: Secondary | ICD-10-CM

## 2012-06-06 DIAGNOSIS — I080 Rheumatic disorders of both mitral and aortic valves: Secondary | ICD-10-CM | POA: Diagnosis present

## 2012-06-06 DIAGNOSIS — Z955 Presence of coronary angioplasty implant and graft: Secondary | ICD-10-CM

## 2012-06-06 DIAGNOSIS — I2510 Atherosclerotic heart disease of native coronary artery without angina pectoris: Secondary | ICD-10-CM | POA: Diagnosis present

## 2012-06-06 DIAGNOSIS — L408 Other psoriasis: Secondary | ICD-10-CM | POA: Diagnosis present

## 2012-06-06 DIAGNOSIS — N184 Chronic kidney disease, stage 4 (severe): Secondary | ICD-10-CM | POA: Diagnosis present

## 2012-06-06 DIAGNOSIS — I447 Left bundle-branch block, unspecified: Secondary | ICD-10-CM | POA: Diagnosis present

## 2012-06-06 DIAGNOSIS — I214 Non-ST elevation (NSTEMI) myocardial infarction: Principal | ICD-10-CM | POA: Diagnosis present

## 2012-06-06 DIAGNOSIS — F329 Major depressive disorder, single episode, unspecified: Secondary | ICD-10-CM | POA: Diagnosis present

## 2012-06-06 DIAGNOSIS — I359 Nonrheumatic aortic valve disorder, unspecified: Secondary | ICD-10-CM

## 2012-06-06 DIAGNOSIS — Z7902 Long term (current) use of antithrombotics/antiplatelets: Secondary | ICD-10-CM

## 2012-06-06 DIAGNOSIS — Z79899 Other long term (current) drug therapy: Secondary | ICD-10-CM

## 2012-06-06 DIAGNOSIS — I129 Hypertensive chronic kidney disease with stage 1 through stage 4 chronic kidney disease, or unspecified chronic kidney disease: Secondary | ICD-10-CM | POA: Diagnosis present

## 2012-06-06 DIAGNOSIS — I079 Rheumatic tricuspid valve disease, unspecified: Secondary | ICD-10-CM | POA: Diagnosis present

## 2012-06-06 DIAGNOSIS — Z7982 Long term (current) use of aspirin: Secondary | ICD-10-CM

## 2012-06-06 DIAGNOSIS — F411 Generalized anxiety disorder: Secondary | ICD-10-CM | POA: Diagnosis present

## 2012-06-06 HISTORY — DX: Left bundle-branch block, unspecified: I44.7

## 2012-06-06 HISTORY — DX: Atherosclerotic heart disease of native coronary artery without angina pectoris: I25.10

## 2012-06-06 LAB — CBC
HCT: 36.7 % (ref 36.0–46.0)
Hemoglobin: 12.5 g/dL (ref 12.0–15.0)
MCH: 29.8 pg (ref 26.0–34.0)
MCHC: 34.1 g/dL (ref 30.0–36.0)
MCV: 87.6 fL (ref 78.0–100.0)
RDW: 12.5 % (ref 11.5–15.5)

## 2012-06-06 LAB — BASIC METABOLIC PANEL
BUN: 26 mg/dL — ABNORMAL HIGH (ref 6–23)
Chloride: 100 mEq/L (ref 96–112)
Creatinine, Ser: 1.76 mg/dL — ABNORMAL HIGH (ref 0.50–1.10)
GFR calc Af Amer: 30 mL/min — ABNORMAL LOW (ref >90)
Glucose, Bld: 97 mg/dL (ref 70–99)

## 2012-06-06 LAB — DIFFERENTIAL
Basophils Absolute: 0.1 10*3/uL (ref 0.0–0.1)
Basophils Relative: 1 % (ref 0–1)
Eosinophils Relative: 6 % — ABNORMAL HIGH (ref 0–5)
Monocytes Absolute: 0.9 10*3/uL (ref 0.1–1.0)
Monocytes Relative: 10 % (ref 3–12)

## 2012-06-06 LAB — CARDIAC PANEL(CRET KIN+CKTOT+MB+TROPI)
Relative Index: 3.8 — ABNORMAL HIGH (ref 0.0–2.5)
Total CK: 146 U/L (ref 7–177)

## 2012-06-06 MED ORDER — SODIUM CHLORIDE 0.9 % IV SOLN: 250.0000 mL | INTRAVENOUS | Status: DC | PRN

## 2012-06-06 MED ORDER — NITROGLYCERIN IN D5W 200-5 MCG/ML-% IV SOLN
2.0000 ug/min | INTRAVENOUS | Status: DC
  Administered 2012-06-06: 5 ug/min via INTRAVENOUS
  Filled 2012-06-06: qty 250

## 2012-06-06 MED ORDER — ACETAMINOPHEN 325 MG PO TABS
650.0000 mg | ORAL_TABLET | ORAL | Status: DC | PRN
  Filled 2012-06-06: qty 2

## 2012-06-06 MED ORDER — SODIUM CHLORIDE 0.9 % IV SOLN
INTRAVENOUS | Status: DC
  Administered 2012-06-06: 20:00:00 via INTRAVENOUS

## 2012-06-06 MED ORDER — SODIUM CHLORIDE 0.9 % IJ SOLN: 3.0000 mL | INTRAMUSCULAR | Status: DC | PRN

## 2012-06-06 MED ORDER — HEPARIN BOLUS VIA INFUSION
3000.0000 [IU] | Freq: Once | INTRAVENOUS | Status: AC
  Administered 2012-06-06: 3000 [IU] via INTRAVENOUS
  Filled 2012-06-06: qty 3000

## 2012-06-06 MED ORDER — ALPRAZOLAM 0.25 MG PO TABS
0.2500 mg | ORAL_TABLET | Freq: Two times a day (BID) | ORAL | Status: DC | PRN
  Administered 2012-06-06: 0.25 mg via ORAL
  Filled 2012-06-06: qty 1

## 2012-06-06 MED ORDER — NITROGLYCERIN 0.4 MG SL SUBL
0.4000 mg | SUBLINGUAL_TABLET | SUBLINGUAL | Status: DC | PRN
  Administered 2012-06-06 (×2): 0.4 mg via SUBLINGUAL
  Filled 2012-06-06: qty 25

## 2012-06-06 MED ORDER — NISOLDIPINE ER 17 MG PO TB24
17.0000 mg | ORAL_TABLET | Freq: Every day | ORAL | Status: DC
  Administered 2012-06-06 – 2012-06-07 (×2): 17 mg via ORAL
  Filled 2012-06-06 (×5): qty 1

## 2012-06-06 MED ORDER — ATENOLOL 50 MG PO TABS
50.0000 mg | ORAL_TABLET | Freq: Every day | ORAL | Status: DC
  Administered 2012-06-07 – 2012-06-08 (×2): 50 mg via ORAL
  Filled 2012-06-06 (×2): qty 1

## 2012-06-06 MED ORDER — DIAZEPAM 2 MG PO TABS
2.0000 mg | ORAL_TABLET | ORAL | Status: AC
  Administered 2012-06-07: 2 mg via ORAL
  Filled 2012-06-06: qty 1

## 2012-06-06 MED ORDER — HEPARIN (PORCINE) IN NACL 100-0.45 UNIT/ML-% IJ SOLN
750.0000 [IU]/h | INTRAMUSCULAR | Status: DC
  Administered 2012-06-06: 750 [IU]/h via INTRAVENOUS
  Filled 2012-06-06 (×2): qty 250

## 2012-06-06 MED ORDER — ONDANSETRON HCL 4 MG/2ML IJ SOLN
4.0000 mg | Freq: Four times a day (QID) | INTRAMUSCULAR | Status: DC | PRN
  Administered 2012-06-07: 22:00:00 4 mg via INTRAVENOUS
  Filled 2012-06-06: qty 2

## 2012-06-06 MED ORDER — ASPIRIN 300 MG RE SUPP
300.0000 mg | RECTAL | Status: AC
  Filled 2012-06-06: qty 1

## 2012-06-06 MED ORDER — ASPIRIN EC 81 MG PO TBEC
81.0000 mg | DELAYED_RELEASE_TABLET | Freq: Every day | ORAL | Status: DC
  Administered 2012-06-07 – 2012-06-08 (×2): 81 mg via ORAL
  Filled 2012-06-06 (×2): qty 1

## 2012-06-06 MED ORDER — ASPIRIN 81 MG PO CHEW: 324.0000 mg | CHEWABLE_TABLET | ORAL | Status: AC

## 2012-06-06 MED ORDER — HYDROCODONE-ACETAMINOPHEN 5-325 MG PO TABS
1.0000 | ORAL_TABLET | Freq: Four times a day (QID) | ORAL | Status: DC | PRN
  Filled 2012-06-06: qty 1

## 2012-06-06 MED ORDER — SODIUM CHLORIDE 0.9 % IJ SOLN: 3.0000 mL | Freq: Two times a day (BID) | INTRAMUSCULAR | Status: DC

## 2012-06-06 MED ORDER — HYDRALAZINE HCL 20 MG/ML IJ SOLN
10.0000 mg | Freq: Four times a day (QID) | INTRAMUSCULAR | Status: DC | PRN
  Administered 2012-06-07: 10 mg via INTRAVENOUS
  Filled 2012-06-06: qty 0.5
  Filled 2012-06-06: qty 1

## 2012-06-06 NOTE — Progress Notes (Signed)
Alinda Money with [facility] notified of BP 210/75 he will address when he gets to hospital shortly.

## 2012-06-06 NOTE — Progress Notes (Signed)
ANTICOAGULATION CONSULT NOTE - Initial Consult  Pharmacy Consult for Heparin Indication: chest pain/ACS  Allergies  Allergen Reactions  . Metoprolol Swelling  . Norvasc (Amlodipine Besylate) Swelling  . Sulfonamide Derivatives Hives and Swelling    Patient Measurements: Height: 5\' 2"  (157.5 cm) Weight: 129 lb 14.4 oz (58.922 kg) IBW/kg (Calculated) : 50.1  Heparin Dosing Weight: 58.9 kg  Vital Signs: Temp: 98.4 F (36.9 C) (07/10 1926) Temp src: Oral (07/10 1926) BP: 192/84 mmHg (07/10 1926) Pulse Rate: 82  (07/10 1926)  Labs: No results found for this basename: HGB:2,HCT:3,PLT:3,APTT:3,LABPROT:3,INR:3,HEPARINUNFRC:3,CREATININE:3,CKTOTAL:3,CKMB:3,TROPONINI:3 in the last 72 hours  Estimated Creatinine Clearance: 17.7 ml/min (by C-G formula based on Cr of 2).   Medical History: Past Medical History  Diagnosis Date  . Lower extremity edema     bilateral  . Chronic renal insufficiency   . Labile hypertension   . Aortic insufficiency   . Mitral valve regurgitation   . Anxiety   . Major depressive disorder, single episode, severe   . Failure to thrive in childhood   . History of shingles July 2004    on the left side of her thorax and postherpetic neuralgia  . History of psoriasis   . History of recurrent UTIs   . Hx of salpingo-oophorectomy, bilateral     Medications:  Scheduled:     . aspirin  324 mg Oral NOW   Or  . aspirin  300 mg Rectal NOW  . aspirin EC  81 mg Oral Daily  . atenolol  50 mg Oral Daily  . nisoldipine  17 mg Oral Daily    Assessment: 78 yr old female admitted with chest pain, weakness.  Pharmacy asked to start IV heparin for [place].  Planning for cardiac cath tomorrow, although cath may be deferred due to pre-existing renal disease.  Baseline labs in progress, patient says no hx blood thinners PTA.  Goal of Therapy:  Heparin level 0.3-0.7 units/ml Monitor platelets by anticoagulation protocol: Yes   Plan:  1. Start IV heparin with bolus  of heparin 3000 units x 1. 2. Then start IV heparin gtt at 750 units/hr. 3. Check heparin level in 8 hrs. 4. Daily heparin level and CBC.  Gardner Candle 06/06/2012,8:02 PM

## 2012-06-06 NOTE — Patient Instructions (Signed)
Admit to [facility] with [place].

## 2012-06-06 NOTE — Progress Notes (Signed)
HPI:  78 year old woman presenting for followup evaluation. The patient is followed for aortic valve disease with moderate aortic stenosis and insufficiency. She has a history of hypertension and chronic kidney disease. Her last echocardiogram was in November 2012 and demonstrated low normal left ventricular function with an ejection fraction of 50%. There was LV dyssynchrony related to her left bundle branch block. There was moderate aortic stenosis and moderate aortic insufficiency noted with mild pulmonary hypertension. Her right ventricular size and systolic function were within normal limits.  The patient presents with 3-4 days of resting chest pain. This is occurred intermittently. She first noticed this after mowing her lawn 4 days ago. She felt extremely weak after she did this. She then awoke with substernal chest burning radiating to the neck and both arms. This resolved after several minutes. She had a second episode the following day and also woke her from sleep. She's had episodic "burning" in her neck at rest and with exertion. She saw Dr. Hyman Hopes yesterday who prescribed nitroglycerin and advised her to followup here today. She hadn't taken nitroglycerin last night after being awoken again with chest pain. Her pain resolved within 10 minutes of taking the nitroglycerin. She's had mild discomfort in her neck intermittently today. She notes associated shortness of breath, but does have a chronic component of shortness of breath as well. She denies orthopnea, PND, or palpitations. She had edema after starting metoprolol but is now back to her baseline after starting back on atenolol.  Outpatient Encounter Prescriptions as of 06/06/2012  Medication Sig Dispense Refill  . aspirin EC 81 MG tablet Take 81 mg by mouth 2 (two) times daily.      Marland Kitchen atenolol (TENORMIN) 50 MG tablet Take 50 mg by mouth daily.       Marland Kitchen b complex vitamins tablet Take 1 tablet by mouth daily.        . Calcium  Carbonate-Vitamin D (CALCIUM 600 + D PO) Take 1 tablet by mouth 2 (two) times daily.       . cholecalciferol (VITAMIN D) 1000 UNITS tablet Take 5,000 Units by mouth daily.       . Cinnamon 500 MG TABS Take 1 tablet by mouth 2 (two) times daily.       . COD LIVER OIL W/VIT A & D PO Take 1 tablet by mouth 2 (two) times daily.       Marland Kitchen DIOVAN 160 MG tablet TAKE 1 TABLET BY MOUTH TWICE A DAY  60 tablet  2  . fish oil-omega-3 fatty acids 1000 MG capsule Take 2 g by mouth daily.        Marland Kitchen HYDROcodone-acetaminophen (VICODIN) 5-500 MG per tablet Take 1 tablet by mouth every 6 (six) hours as needed. For pain  30 tablet  1  . Krill Oil 300 MG CAPS Take 1 capsule by mouth daily.       . nisoldipine (SULAR) 17 MG 24 hr tablet TAKE 1 TABLET EVERY DAY  30 tablet  11  . vitamin E 400 UNIT capsule Take 400 Units by mouth daily.          Allergies  Allergen Reactions  . Metoprolol Swelling  . Norvasc (Amlodipine Besylate) Swelling  . Sulfonamide Derivatives Hives and Swelling    Past Medical History  Diagnosis Date  . Lower extremity edema     bilateral  . Chronic renal insufficiency   . Labile hypertension   . Aortic insufficiency   . Mitral valve regurgitation   .  Anxiety   . Major depressive disorder, single episode, severe   . Failure to thrive in childhood   . History of shingles July 2004    on the left side of her thorax and postherpetic neuralgia  . History of psoriasis   . History of recurrent UTIs   . Hx of salpingo-oophorectomy, bilateral     ROS: Negative except as per HPI  BP 193/79  Pulse 57  Ht 5\' 2"  (1.575 m)  Wt 60.383 kg (133 lb 1.9 oz)  BMI 24.35 kg/m2  PHYSICAL EXAM: Pt is alert and oriented, elderly woman in NAD HEENT: normal Neck: JVP - normal, carotids 2+= with a prominent right carotid bruit Lungs: CTA bilaterally CV: RRR with a grade 3/6 systolic murmur at the right upper sternal border and a soft diastolic decrescendo murmur also at the right upper sternal  border Abd: soft, NT, Positive BS, no hepatomegaly Ext: Trace pretibial edema bilaterally, distal pulses intact and equal Skin: warm/dry no rash  EKG:  Sinus bradycardia 58 beats per minute, left ventricular hypertrophy with repolarization abnormality. No significant change from previous tracing  Labs drawn 06/05/2012 pertinent for a white blood cell count of 7.3, hemoglobin 12.2, hematocrit 34.8, platelet count 279,000, glucose 102, sodium 137, potassium 3.7, calcium 10.0, creatinine 1.7. Her baseline creatinine is 2.0.  ASSESSMENT AND PLAN: 1. Unstable angina pectoris. The patient has concerning symptoms of unstable angina. I had along discussion with her and she was reluctant to go to the hospital. I have finally convinced her that hospitalization is necessary. I spoke with Dr. Hyman Hopes who is her nephrologist and agrees with the plan. Fortunately, her creatinine was a little better than her baseline yesterday at 1.7 mg/dL. The patient will be admitted to a telemetry bed, started on intravenous unfractionated heparin, and scheduled for cardiac catheterization with limited diet tomorrow. She will be treated with IV nitroglycerin if her chest pain recurs at rest. I reviewed in detail the potential risks, indications, and alternatives to cardiac catheterization and possible PCI. She understands that depending on the complexity of her coronary disease, I may be able to proceed with PCI at the time of her diagnostic catheterization, but we may have to defer PCI considering her renal insufficiency. Her daughter is going to drive her to the hospital and she is with her today.  2. Malignant hypertension. Patient's blood pressure has been good at home. She has had severe white coat hypertension in the past. We'll continue to monitor. We'll place her Diovan on hold with upcoming dye load tomorrow. She will continue on nisoldipine and atenolol.  3. Aortic stenosis and insufficiency. Further evaluation tomorrow at  the time of her cardiac catheterization.  4. Disposition: Admit to the hospital with unstable angina, n.p.o. after midnight except meds, cardiac catheterization and possible PCI tomorrow.  Tonny Bollman 06/06/2012 3:25 PM

## 2012-06-07 ENCOUNTER — Other Ambulatory Visit: Payer: Self-pay | Admitting: Family Medicine

## 2012-06-07 ENCOUNTER — Encounter (HOSPITAL_COMMUNITY)
Admission: AD | Disposition: A | Discharge status: Home / Self Care | Payer: Self-pay | Source: Ambulatory Visit | Attending: Cardiovascular Disease

## 2012-06-07 ENCOUNTER — Ambulatory Visit (HOSPITAL_COMMUNITY): Admit: 2012-06-07 | Payer: Self-pay | Admitting: Cardiovascular Disease

## 2012-06-07 DIAGNOSIS — I2510 Atherosclerotic heart disease of native coronary artery without angina pectoris: Secondary | ICD-10-CM

## 2012-06-07 DIAGNOSIS — I214 Non-ST elevation (NSTEMI) myocardial infarction: Secondary | ICD-10-CM

## 2012-06-07 DIAGNOSIS — I200 Unstable angina: Secondary | ICD-10-CM

## 2012-06-07 HISTORY — PX: LEFT HEART CATHETERIZATION WITH CORONARY ANGIOGRAM: SHX5451

## 2012-06-07 LAB — BASIC METABOLIC PANEL
BUN: 25 mg/dL — ABNORMAL HIGH (ref 6–23)
CO2: 28 mEq/L (ref 19–32)
Calcium: 8.9 mg/dL (ref 8.4–10.5)
Creatinine, Ser: 1.59 mg/dL — ABNORMAL HIGH (ref 0.50–1.10)
Glucose, Bld: 95 mg/dL (ref 70–99)

## 2012-06-07 LAB — CARDIAC PANEL(CRET KIN+CKTOT+MB+TROPI)
CK, MB: 4.9 ng/mL — ABNORMAL HIGH (ref 0.3–4.0)
Relative Index: 4 — ABNORMAL HIGH (ref 0.0–2.5)
Total CK: 121 U/L (ref 7–177)
Total CK: 120 U/L (ref 7–177)
Troponin I: 0.67 ng/mL (ref <0.30)

## 2012-06-07 LAB — CBC
HCT: 35.3 % — ABNORMAL LOW (ref 36.0–46.0)
Hemoglobin: 12 g/dL (ref 12.0–15.0)
MCH: 29.7 pg (ref 26.0–34.0)
MCHC: 34 g/dL (ref 30.0–36.0)
RBC: 4.04 MIL/uL (ref 3.87–5.11)

## 2012-06-07 LAB — LIPID PANEL
Cholesterol: 202 mg/dL — ABNORMAL HIGH (ref 0–200)
LDL Cholesterol: 115 mg/dL — ABNORMAL HIGH (ref 0–99)
Total CHOL/HDL Ratio: 3 RATIO
Triglycerides: 102 mg/dL (ref <150)
VLDL: 20 mg/dL (ref 0–40)

## 2012-06-07 LAB — POCT ACTIVATED CLOTTING TIME: Activated Clotting Time: 369 seconds

## 2012-06-07 SURGERY — LEFT HEART CATHETERIZATION WITH CORONARY ANGIOGRAM
Anesthesia: LOCAL | Laterality: Bilateral

## 2012-06-07 MED ORDER — HEPARIN SODIUM (PORCINE) 1000 UNIT/ML IJ SOLN
INTRAMUSCULAR | Status: AC
  Filled 2012-06-07: qty 1

## 2012-06-07 MED ORDER — OXYCODONE-ACETAMINOPHEN 5-325 MG PO TABS: 1.0000 | ORAL_TABLET | ORAL | Status: DC | PRN

## 2012-06-07 MED ORDER — BIOTENE DRY MOUTH MT LIQD
15.0000 mL | Freq: Two times a day (BID) | OROMUCOSAL | Status: DC
  Administered 2012-06-08: 15 mL via OROMUCOSAL

## 2012-06-07 MED ORDER — VERAPAMIL HCL 2.5 MG/ML IV SOLN
INTRAVENOUS | Status: AC
  Filled 2012-06-07: qty 2

## 2012-06-07 MED ORDER — MORPHINE SULFATE 4 MG/ML IJ SOLN
2.0000 mg | INTRAMUSCULAR | Status: DC | PRN
  Administered 2012-06-07: 2 mg via INTRAVENOUS

## 2012-06-07 MED ORDER — ISOSORBIDE MONONITRATE ER 30 MG PO TB24
30.0000 mg | ORAL_TABLET | Freq: Every day | ORAL | Status: DC
  Administered 2012-06-07 – 2012-06-08 (×2): 30 mg via ORAL
  Filled 2012-06-07 (×2): qty 1

## 2012-06-07 MED ORDER — BIVALIRUDIN 250 MG IV SOLR
INTRAVENOUS | Status: AC
  Filled 2012-06-07: qty 250

## 2012-06-07 MED ORDER — SODIUM CHLORIDE 0.9 % IJ SOLN: 3.0000 mL | INTRAMUSCULAR | Status: DC | PRN

## 2012-06-07 MED ORDER — NITROGLYCERIN 0.2 MG/ML ON CALL CATH LAB
INTRAVENOUS | Status: AC
  Filled 2012-06-07: qty 1

## 2012-06-07 MED ORDER — HEPARIN (PORCINE) IN NACL 2-0.9 UNIT/ML-% IJ SOLN
INTRAMUSCULAR | Status: AC
  Filled 2012-06-07: qty 2000

## 2012-06-07 MED ORDER — FENTANYL CITRATE 0.05 MG/ML IJ SOLN
INTRAMUSCULAR | Status: AC
  Filled 2012-06-07: qty 2

## 2012-06-07 MED ORDER — MORPHINE SULFATE 4 MG/ML IJ SOLN
INTRAMUSCULAR | Status: AC
  Filled 2012-06-07: qty 1

## 2012-06-07 MED ORDER — ACETAMINOPHEN 325 MG PO TABS: 650.0000 mg | ORAL_TABLET | ORAL | Status: DC | PRN

## 2012-06-07 MED ORDER — LIDOCAINE HCL (PF) 1 % IJ SOLN
INTRAMUSCULAR | Status: AC
  Filled 2012-06-07: qty 30

## 2012-06-07 MED ORDER — SODIUM CHLORIDE 0.9 % IV SOLN: 250.0000 mL | INTRAVENOUS | Status: DC

## 2012-06-07 MED ORDER — CLOPIDOGREL BISULFATE 300 MG PO TABS
ORAL_TABLET | ORAL | Status: AC
  Filled 2012-06-07: qty 2

## 2012-06-07 MED ORDER — MIDAZOLAM HCL 2 MG/2ML IJ SOLN
INTRAMUSCULAR | Status: AC
  Filled 2012-06-07: qty 2

## 2012-06-07 MED ORDER — CLOPIDOGREL BISULFATE 75 MG PO TABS
75.0000 mg | ORAL_TABLET | Freq: Every day | ORAL | Status: DC
  Administered 2012-06-08: 09:00:00 75 mg via ORAL
  Filled 2012-06-07: qty 1

## 2012-06-07 MED ORDER — SODIUM CHLORIDE 0.9 % IJ SOLN: 3.0000 mL | Freq: Two times a day (BID) | INTRAMUSCULAR | Status: DC

## 2012-06-07 MED ORDER — FAMOTIDINE IN NACL 20-0.9 MG/50ML-% IV SOLN
INTRAVENOUS | Status: AC
  Filled 2012-06-07: qty 50

## 2012-06-07 MED ORDER — ONDANSETRON HCL 4 MG/2ML IJ SOLN: 4.0000 mg | Freq: Four times a day (QID) | INTRAMUSCULAR | Status: DC | PRN

## 2012-06-07 MED ORDER — SODIUM CHLORIDE 0.9 % IV SOLN: 1.0000 mL/kg/h | INTRAVENOUS | Status: AC

## 2012-06-07 NOTE — Progress Notes (Signed)
Pt sitting in bed resting, recently finished supper, had episode of vomiting and vagalled, 4.88 sec pause on tele, see tele strip in shadow chart. No loss of consciousness.  BP 126/62.  Dr Shirlee Latch informed, no orders received.

## 2012-06-07 NOTE — Progress Notes (Signed)
CRITICAL VALUE ALERT  Critical value received: troponin 0.74   Date of notification:  06/06/12   Time of notification:  2045   Critical value read back:yes  Nurse who received alert:  Nikki Dom, RN   MD notified (1st page): Toni A. PA  Time of first page:  2150  MD notified (2nd page):  Time of second page:  Responding MD:  No response, treatment appropriate  Time MD responded:

## 2012-06-07 NOTE — Progress Notes (Addendum)
ANTICOAGULATION CONSULT NOTE - Follow Up Consult  Pharmacy Consult for heparin Indication: chest pain/ACS  Labs:  Basename 06/07/12 0400 06/07/12 0014 06/06/12 1950 06/06/12 1917  HGB 12.0 -- 12.5 --  HCT 35.3* -- 36.7 --  PLT 245 -- 269 --  APTT -- -- -- --  LABPROT -- -- 13.3 --  INR -- -- 0.99 --  HEPARINUNFRC 0.57 -- -- --  CREATININE -- -- 1.76* --  CKTOTAL -- 121 -- 146  CKMB -- 4.9* -- 5.6*  TROPONINI -- 0.67* -- 0.74*    Assessment/Plan: 78yo female therapeutic on heparin with initial dosing for CP.  Will continue gtt at current rate and confirm stable prior to cath.  Colleen Can PharmD BCPS 06/07/2012,4:37 AM  Addendum: Heparin level remains therapeutic on 750 units/hr.  Heparin level = 0.46.  Will follow-up after cath for further anticoag plans.  [employer], Pharm.D., BCPS Clinical Pharmacist Pager [phone] 06/07/2012 10:48 AM

## 2012-06-07 NOTE — Clinical Documentation Improvement (Signed)
CKD DOCUMENTATION CLARIFICATION QUERY   THIS DOCUMENT IS NOT A PERMANENT PART OF THE MEDICAL RECORD  TO RESPOND TO THE THIS QUERY, FOLLOW THE INSTRUCTIONS BELOW:  1. If needed, update documentation for the patient's encounter via the notes activity.  2. Access this query again and click edit on the In [employer].  3. After updating, or not, click F2 to complete all highlighted (required) fields concerning your review. Select "additional documentation in the medical record" OR "no additional documentation provided".  4. Click Sign note button.  5. The deficiency will fall out of your In Basket *Please let us know if you are not able to complete this workflow by phone or e-mail (listed below).  Please update your documentation within the medical record to reflect your response to this query.                                                                                        06/07/12   Dear Dr. Clifton James Marton Redwood,  In a better effort to capture your patient's severity of illness, reflect appropriate length of stay and utilization of resources, a review of the patient medical record has revealed the following indicators.  THANK YOU FOR CLARIFYING IN NOTES AND DC SUMMARY "CKD" AND "CRI".  Possible Clinical Conditions?  - CKD Stage I -  GFR =90 - CKD Stage II - GFR 60-80 - CKD Stage III - GFR 30-59 - CKD Stage IV - GFR 15-29 - CKD Stage V - GFR < 15 - ESRD (End Stage Renal Disease) - Other Condition (please specify) - Cannot Clinically Determine    Supporting Information: - Risk Factors: History CKD, CRI. - BUN/Creatinine level: 7/11:25/1.59 - Calculated GFR: 711: 30/34 - Treatment: NS IV 1ml/hr, 7/11 CBC, daily wts, strict I&O  You may use possible, probable, or suspect with inpatient documentation. possible, probable, suspected diagnoses MUST be documented at the time of discharge  Reviewed: Yes   Thank You,  Beverley Fiedler RN Clinical Documentation  Specialist: Pager: [phone] Health Information Management:  [phone] [place]  Will include in am note.

## 2012-06-07 NOTE — Care Management Note (Unsigned)
    Page 1 of 1   06/07/2012     10:38:27 AM   CARE MANAGEMENT NOTE 06/07/2012  Patient:  [name], [name]   Account Number:  [id number]  Date Initiated:  06/07/2012  Documentation initiated by:  GRAVES-BIGELOW,Glenice Ciccone  Subjective/Objective Assessment:   Pt admitted with rest angina. Elevated troponins. Plan for cath today.     Action/Plan:   CM will monitor post prcedure for any home needs.   Anticipated DC Date:  06/09/2012   Anticipated DC Plan:  HOME W HOME HEALTH SERVICES      DC Planning Services  CM consult      Choice offered to / List presented to:             Status of service:  In process, will continue to follow Medicare Important Message given?   (If response is "NO", the following Medicare IM given date fields will be blank) Date Medicare IM given:   Date Additional Medicare IM given:    Discharge Disposition:    Per UR Regulation:  Reviewed for med. necessity/level of care/duration of stay  If discussed at Long Length of Stay Meetings, dates discussed:    Comments:

## 2012-06-07 NOTE — Progress Notes (Signed)
Rt radial TR band deflated and removed per protocol.  Area cleansed w/ CHG wipe and redressed w/ 2x2/tegaderm.  + reverse allens test per pleth rt thumb.  Level 1, puffy and bruised.  Fingers warm, mobile.

## 2012-06-07 NOTE — Progress Notes (Signed)
    SUBJECTIVE: No chest pain or SOB.   BP 122/64  Pulse 56  Temp 97.7 F (36.5 C) (Oral)  Resp 18  Ht 5\' 2"  (1.575 m)  Wt 130 lb (58.968 kg)  BMI 23.78 kg/m2  SpO2 98% No intake or output data in the 24 hours ending 06/07/12 0755  PHYSICAL EXAM General: Well developed, well nourished, in no acute distress. Alert and oriented x 3.  Psych:  Good affect, responds appropriately  LABS: Basic Metabolic Panel:  Basename 06/07/12 0400 06/06/12 1950  NA 139 140  K 3.8 3.7  CL 103 100  CO2 28 28  GLUCOSE 95 97  BUN 25* 26*  CREATININE 1.59* 1.76*  CALCIUM 8.9 9.6  MG -- --  PHOS -- --   CBC:  Basename 06/07/12 0400 06/06/12 1950  WBC 8.0 9.2  NEUTROABS -- 4.8  HGB 12.0 12.5  HCT 35.3* 36.7  MCV 87.4 87.6  PLT 245 269   Cardiac Enzymes:  Basename 06/07/12 0639 06/07/12 0014 06/06/12 1917  CKTOTAL 120 121 146  CKMB 5.1* 4.9* 5.6*  CKMBINDEX -- -- --  TROPONINI 0.67* 0.67* 0.74*   Fasting Lipid Panel:  Basename 06/07/12 0400  CHOL 202*  HDL 67  LDLCALC 115*  TRIG 102  CHOLHDL 3.0  LDLDIRECT --    Current Meds:    . aspirin  324 mg Oral NOW   Or  . aspirin  300 mg Rectal NOW  . aspirin EC  81 mg Oral Daily  . atenolol  50 mg Oral Daily  . diazepam  2 mg Oral On Call  . heparin  3,000 Units Intravenous Once  . nisoldipine  17 mg Oral Daily  . sodium chloride  3 mL Intravenous Q12H     ASSESSMENT AND PLAN:  1. Unstable angina with mild elevation troponin 2. Moderate aortic stenosis 3. Chronic kidney disease 4. HTN   Plans for cath today per Dr. Excell Seltzer. Labs reviewed and ok for cath. Renal function is better than her baseline. BP better this am.     Deasha Clendenin  7/11/20137:55 AM

## 2012-06-07 NOTE — Progress Notes (Signed)
UR Completed Desarae Placide Graves-Bigelow, RN,BSN [phone]  

## 2012-06-07 NOTE — Progress Notes (Signed)
Dr Excell Seltzer in to examine, orders received to start Imdur PO and wean off NTG gtt.  Pt denies CP

## 2012-06-07 NOTE — CV Procedure (Signed)
Cardiac Catheterization Procedure Note  Name: [name] MRN: [id number] DOB: 10-03-1930  Procedure:  Catheter placement, Selective Coronary Angiography,  PTCA and stenting of the first obtuse marginal  Indication: 78 year old woman well known to me. She presented to the office yesterday with symptoms of unstable angina. I admitted her from the office and she ruled in for non-ST elevation MI. She has been followed for moderate mixed aortic valve disease. The patient is followed by Dr. Hyman Hopes for chronic kidney disease and her renal function has been stable. I discussed her case with Dr. Hyman Hopes yesterday and we plan to do a limited dye coronary angiogram and possible PCI today.  Procedural Details:  The right wrist was prepped, draped, and anesthetized with 1% lidocaine. Using the modified Seldinger technique, a 5 French sheath was introduced into the right radial artery. 3 mg of verapamil was administered through the sheath, weight-based unfractionated heparin was administered intravenously. Standard Judkins catheters were used for selective coronary angiography. Catheter exchanges were performed over an exchange length guidewire.  PROCEDURAL FINDINGS Hemodynamics: AO 161/61   Coronary angiography: Coronary dominance: right  Left mainstem: The left mainstem is patent. There is mild irregularity without significant stenosis. The left main is short.  Left anterior descending (LAD): The LAD courses to the left ventricular apex. There is diffuse proximal calcification. The ostium of the LAD has 20-30% stenosis. The proximal vessel has scattered irregularities throughout. The mid vessel has 50% stenosis. The distal vessel as it wraps around the apex is widely patent there were 2 diagonal branches arising from the LAD. The first is an 80% ostial stenosis and this is a very small diagonal. The second diagonal is of moderate caliber and is patent throughout.  Left circumflex (LCx): The left  circumflex is tortuous. The vessel is moderately calcified. It supplies a large first obtuse marginal branch that then divides into twin branches. The proximal aspect of the marginal branch has a 95% stenosis present. This lesion is very focal in nature. The AV groove circumflex beyond the marginal branch is small. As the mid AV groove circumflex arises from the marginal there is a 70% ostial stenosis. It then gives off 2 small branch vessels, the first of which has a 95% stenosis in the second of which is widely patent. Both of these vessels are less than 2 mm in diameter.  Right coronary artery (RCA): The right coronary cusp is calcified. The RCA is patent throughout. There are diffuse irregularities and mild calcification. There are no high-grade stenoses present. The vessel gives off a single PDA and a single posterolateral branch.  Left ventriculography: Deferred because of chronic kidney disease  PCI Note:  Following the diagnostic procedure, the decision was made to proceed with PCI. The radial sheath was upsized to a 6 [place]. Weight-based bivalirudin was given for anticoagulation. The patient was loaded with 600 mg of Plavix on the table. Once a therapeutic ACT was achieved, a 6 [place] XB LAD 3.5 cm guide catheter was inserted.  A cougar coronary guidewire was used to cross the lesion. The obtuse marginal branch was tortuous and the wire was moderately difficult to advance The lesion was predilated with a 2.5 x 15 mm balloon.  I then attempted to advance a 3.5 x 12 mm Xience Xpedition DES. However, the stent would not cross the lesion. This appeared to be due to vessel tortuosity. I buddy wired the obtuse marginal with a grand slam wire with a moderate amount of difficulty. The stent  was placed on the grand slam wire and it still would not cross. I then took a 3 mm balloon to help anchor delivery of a guideliner catheter. This allowed successful stent delivery through the guide line or catheter. The  lesion was then stented with a 3.5x12 mm Xience stent.  The stent was postdilated with a 3.75 x 8 mm noncompliant balloon.  Following PCI, there was 0% residual stenosis and TIMI-3 flow.  The AV groove circumflex was narrowed after stenting but there remained TIMI-3 flow. Considering the patient's significant chronic kidney disease and overall small area of myocardium, I elected to treat this vessel medically. Final angiography confirmed an excellent result. The patient tolerated the procedure well. There were no immediate procedural complications. A TR band was used for radial hemostasis. The patient was transferred to the post catheterization recovery area for further monitoring.  PCI Data: Vessel -first obtuse marginal/Segment - proximal Percent Stenosis (pre)  95 TIMI-flow 3 Stent 3.5 x 12 mm drug-eluting Percent Stenosis (post) 0 TIMI-flow (post) 3 Total contrast equals 125 cc Final Conclusions:   1. Severe left circumflex stenosis with successful PCI as detailed above 2. Nonobstructive LAD and right coronary artery stenoses 3. Residual severe stenosis in a small subbranch of the left circumflex   Recommendations:  Recommend dual antiplatelet therapy with aspirin and Plavix for 12 months. Medical therapy for her residual CAD unless refractory angina occurs.  Tonny Bollman 06/07/2012, 5:30 PM

## 2012-06-08 ENCOUNTER — Encounter (HOSPITAL_COMMUNITY): Payer: Self-pay | Admitting: Physician Assistant

## 2012-06-08 DIAGNOSIS — I369 Nonrheumatic tricuspid valve disorder, unspecified: Secondary | ICD-10-CM

## 2012-06-08 LAB — BASIC METABOLIC PANEL
Chloride: 105 mEq/L (ref 96–112)
Creatinine, Ser: 1.61 mg/dL — ABNORMAL HIGH (ref 0.50–1.10)
GFR calc Af Amer: 34 mL/min — ABNORMAL LOW (ref >90)
GFR calc non Af Amer: 29 mL/min — ABNORMAL LOW (ref >90)
Potassium: 4 mEq/L (ref 3.5–5.1)

## 2012-06-08 LAB — CBC
Platelets: 240 10*3/uL (ref 150–400)
RDW: 12.8 % (ref 11.5–15.5)
WBC: 8.5 10*3/uL (ref 4.0–10.5)

## 2012-06-08 MED ORDER — ASPIRIN EC 81 MG PO TBEC: 81.0000 mg | DELAYED_RELEASE_TABLET | Freq: Every day | ORAL | Status: AC

## 2012-06-08 MED ORDER — NITROGLYCERIN 0.4 MG SL SUBL: 0.4000 mg | SUBLINGUAL_TABLET | SUBLINGUAL | Status: DC | PRN

## 2012-06-08 MED ORDER — VALSARTAN 160 MG PO TABS: ORAL_TABLET | ORAL | Status: DC

## 2012-06-08 MED ORDER — CLOPIDOGREL BISULFATE 75 MG PO TABS: 75.0000 mg | ORAL_TABLET | Freq: Every day | ORAL | Status: DC

## 2012-06-08 MED ORDER — ISOSORBIDE MONONITRATE ER 30 MG PO TB24: 30.0000 mg | ORAL_TABLET | Freq: Every day | ORAL | Status: DC

## 2012-06-08 MED FILL — Dextrose Inj 5%: INTRAVENOUS | Qty: 1000 | Status: AC

## 2012-06-08 NOTE — Discharge Summary (Signed)
Discharge Summary   Patient ID: [name] MRN: [id number], DOB/AGE: February 20, 1930 78 y.o. Admit date: 06/06/2012 D/C date:     06/08/2012  Primary Cardiologist: Dr. Excell Seltzer  Primary Discharge Diagnoses:  1. CAD with NSTEMI this admission - s/p DES to OM 06/07/12 (residual severe LCx subbranch dz for med rx unless refractory angina recurs) - statin intolerant 2. Intermittent LBBB 3. Valvular heart dz, most recent echo 06/08/12 - thickened AV with mildly restricted motion, mild AI - mild MR - mod TR 4. Chronic renal insufficiency stage III-IV 5. HTN  Secondary Discharge Diagnoses:  1. Bilateral LE edema 2. Major depressive disorder in 05-10-2003 when husband died 3. Anxiety 4. Shingles 2003/05/10 5. Psoriasis 6. H/o recurrent UTIs  Hospital Course: 78 y/o F with hx of HTN, CKD (baseline Cr 2), aortic valve disease with moderate aortic stenosis and insufficiency, with last echo 09/2011 showing EF 50% and dyssynchrony related to LBBB. She presented to Dr. Earmon Phoenix office with c/o 3-4 days of resting CP occuring intermittently. She first noticed it while mowing the lawn which made her extremely weak. However, she had subsequent episodes waking her from sleep. She denied associated SOB but does have a component of chronic SOB. She had edema after starting metoprolol but returned back to her baseline after starting back on atenolol. EKG showed sinus bradycardia 58 beats per minute, left ventricular hypertrophy with repolarization abnormality, no significant change from previous tracing. She was markedly hypertensive in the office with BP 193/79 but reports of normal BP at home. Her symptoms were felt concerning for [place] and she was admitted to the hospital, placed on IV heparin, NTG. Diovan was held because of impending cath. Her troponin rose to a peak of 0.74. Her Cr was actually improved from her baseline. She underwent cath 06/07/12 demonstrating 1. Severe left circumflex stenosis with successful PCI  as detailed above  2. Nonobstructive LAD and right coronary artery stenoses  3. Residual severe stenosis in a small subbranch of the left circumflex  She received DES to her OM branch. Given the patient's significant chronic kidney disease and overall small area of myocardium that the small subbranch of the Cx supplied, Dr. Excell Seltzer recommended med rx for this residual dz unless refractory angina occurred. DAPT with ASA/Plavix for 12 months was recommended. Her NTG gtt was weaned and changed to Imdur. The evening of cath, the patient had an episode of vomiting and vagalled with a 4.88sec pause on telemetry - no LOC, BP was stable. Dr. Riley Kill reviewed this and instructed the patient not to drive until seen by Dr. Excell Seltzer. 2D echo demonstrated EF 60-65%, grade 1 diastolic dysfunction, thickened, calcified AV with mildly restricted motion, mild AI, mod TR, mild MR. Her Cr remained stable post-cath. Her Hgb is slightly decreased but this is in the setting of post-cath fluids and needle sticks, no overt evidence of bleeding. The patient feels better today and was able to ambulate. She did have some recurrent discomfort in her throat that was different than presenting pain towards the end of her walk with cardiac rehab; discussed with Dr. Excell Seltzer - will try her to keep her on present med regimen and see how she does an outpatient. She was instructed to let us know if this remains persistent. Per discussion with Dr. Riley Kill, we will gently add her Diovan back. Dr. Riley Kill has seen and examined her and feels she is stable for discharge.  Discharge Vitals: Blood pressure 127/41, pulse 58, temperature 98 F (36.7 C),  temperature source Oral, resp. rate 20, height 5\' 2"  (1.575 m), weight 130 lb (58.968 kg), SpO2 93.00%.  Labs: Lab Results  Component Value Date   WBC 8.5 06/08/2012   HGB 10.7* 06/08/2012   HCT 32.2* 06/08/2012   MCV 89.0 06/08/2012   PLT 240 06/08/2012     Lab 06/08/12 0603  NA 140  K 4.0  CL 105    CO2 26  BUN 22  CREATININE 1.61*  CALCIUM 8.3*  PROT --  BILITOT --  ALKPHOS --  ALT --  AST --  GLUCOSE 82    Basename 06/07/12 0639 06/07/12 0014 06/06/12 1917  CKTOTAL 120 121 146  CKMB 5.1* 4.9* 5.6*  TROPONINI 0.67* 0.67* 0.74*   Lab Results  Component Value Date   CHOL 202* 06/07/2012   HDL 67 06/07/2012   LDLCALC 115* 06/07/2012   TRIG 102 06/07/2012    Diagnostic Studies/Procedures   1. 2D Echo 06/08/12 Study Conclusions - Left ventricle: The cavity size was normal. Wall thickness was normal. Systolic function was normal. The estimated ejection fraction was in the range of 60% to 65%. Wall motion was normal; there were no regional wall motion abnormalities. Doppler parameters are consistent with abnormal left ventricular relaxation (grade 1 diastolic dysfunction). - Aortic valve: AV is thickened, calcified with mildly restricted motion. Peak and mean gradients through the valve are 23 and 14 mm Hg respectively. Mild regurgitation. - Mitral valve: Mild regurgitation. - Tricuspid valve: Moderate regurgitation.  2. Portable Chest X-ray 1 View 06/06/2012  *RADIOLOGY REPORT*  Clinical Data: Chest pain and shortness of breath.  PORTABLE CHEST - 1 VIEW  Comparison: 02/14/2012  Findings: There is chronic cardiomegaly with tortuosity of the thoracic aorta.  The lungs are hyperinflated with flattening of diaphragm consistent with emphysema.  No acute infiltrates or effusions.  No acute osseous abnormality.  IMPRESSION: Emphysema.  Cardiomegaly.  No acute abnormalities.  Original Report Authenticated By: Gwynn Burly, M.D.    Discharge Medications   Medication List  As of 06/08/2012  1:53 PM   TAKE these medications         aspirin EC 81 MG tablet   Take 1 tablet (81 mg total) by mouth daily.      atenolol 50 MG tablet   Commonly known as: TENORMIN   Take 50 mg by mouth daily.      b complex vitamins tablet   Take 1 tablet by mouth daily.      CALCIUM 600 + D  PO   Take 1 tablet by mouth 2 (two) times daily.      cholecalciferol 1000 UNITS tablet   Commonly known as: VITAMIN D   Take 5,000 Units by mouth daily.      Cinnamon 500 MG Tabs   Take 1 tablet by mouth 2 (two) times daily.      clopidogrel 75 MG tablet   Commonly known as: PLAVIX   Take 1 tablet (75 mg total) by mouth daily with breakfast.      COD LIVER OIL W/VIT A & D PO   Take 1 tablet by mouth 2 (two) times daily.      fish oil-omega-3 fatty acids 1000 MG capsule   Take 2 g by mouth daily.      HYDROcodone-acetaminophen 5-500 MG per tablet   Commonly known as: VICODIN   Take 1 tablet by mouth every 6 (six) hours as needed. For pain      isosorbide mononitrate 30 MG 24  hr tablet   Commonly known as: IMDUR   Take 1 tablet (30 mg total) by mouth daily.      Krill Oil 300 MG Caps   Take 1 capsule by mouth daily.      nisoldipine 17 MG 24 hr tablet   Commonly known as: SULAR   TAKE 1 TABLET EVERY DAY      nitroGLYCERIN 0.4 MG SL tablet   Commonly known as: NITROSTAT   Place 1 tablet (0.4 mg total) under the tongue every 5 (five) minutes x 3 doses as needed for chest pain.      valsartan 160 MG tablet   Commonly known as: DIOVAN   Tomorrow (06/09/12) and Sunday (06/10/12), take 1 tablet by mouth once daily. On Monday 06/11/12, you may go back to taking 1 tablet twice a day.      vitamin E 400 UNIT capsule   Take 400 Units by mouth daily.            Disposition   The patient will be discharged in stable condition to home. Discharge Orders    Future Appointments: Provider: Department: Dept Phone: Center:   06/18/2012 10:30 AM Tonny Bollman, MD [facility] [phone] [id number]     Future Orders Please Complete By Expires   Amb Referral to Cardiac Rehabilitation      Diet - low sodium heart healthy      Comments:   Please make sure to stay hydrated.   Increase activity slowly      Comments:   No driving until cleared by Dr. Excell Seltzer. Hold off on  strenuous activity until seen by your doctor -- increase activity slowly. If you continue to have chest pain, please let your doctor know! No lifting over 10 lbs for 2 weeks. No sexual activity for 2 weeks. Keep procedure site clean & dry. If you notice increased pain, swelling, bleeding or pus, call/return!  You may shower, but no soaking baths/hot tubs/pools for 1 week.     Follow-up Information    Follow up with Tonny Bollman, MD. (06/18/12 at 10:30am)    Contact information:   [address] [place] [address] [phone]            Duration of Discharge Encounter: Greater than 30 minutes including physician and PA time.  Signed, Dayna Dunn PA-C 06/08/2012, 1:53 PM  Agree as above.   Tonny Bollman 06/19/2012 11:31 PM

## 2012-06-08 NOTE — Progress Notes (Signed)
CARDIAC REHAB PHASE I   PRE:  Rate/Rhythm: 63 SR  BP:  Supine:   Sitting: 146/50  Standing:    SaO2:   MODE:  Ambulation: 700 ft   POST:  Rate/Rhythem: 78 Sr  BP:  Supine:   Sitting: 155/57  Standing:    SaO2:  1010-1105 Assisted X 1 to ambulate. Gait steady. Pt without c/o when walking. On return from walk and pt sat on bed c/o of burnig aching feeling in throat 2-3 on pain scale. Pt states that this is the way it would start at home. As she rested pain resolved on its own. Reported to RN. Completed discharge education with pt. She agrees to [employer]. [facility] in [place], will send referral.  Beatrix Fetters

## 2012-06-08 NOTE — Progress Notes (Addendum)
Subjective:  Patient wants to go home.  Up and moving about.  Had brady event last night but in the context of vomiting, and this occurred just after eating.  She feels better now.  I counseled her about not driving and not doing any other major activity such as cutting the grass, things she has done historically.   Objective:  Vital Signs in the last 24 hours: Temp:  [97.4 F (36.3 C)-98 F (36.7 C)] 98 F (36.7 C) (07/12 0600) Pulse Rate:  [52-76] 59  (07/12 0600) Resp:  [13-19] 14  (07/12 0600) BP: (101-165)/(34-67) 132/50 mmHg (07/12 0600) SpO2:  [94 %-99 %] 98 % (07/12 0600)  Intake/Output from previous day: 07/11 0701 - 07/12 0700 In: 557.6 [P.O.:200; I.V.:355.6; IV Piggyback:2] Out: 200 [Urine:200]   Physical Exam: General: Thin, alert, older woman in no distress.   Head:  Normocephalic and atraumatic. Lungs: Clear to auscultation and percussion. Heart: Normal S1 and S2. 3/6 SEM.  No DM.    Pulses: Pulses normal in all 4 extremities.  R wrist looks good.   Extremities: No clubbing or cyanosis. No edema. Neurologic: Alert and oriented x 3.    Lab Results:  Basename 06/08/12 0603 06/07/12 0400  WBC 8.5 8.0  HGB 10.7* 12.0  PLT 240 245    Basename 06/08/12 0603 06/07/12 0400  NA 140 139  K 4.0 3.8  CL 105 103  CO2 26 28  GLUCOSE 82 95  BUN 22 25*  CREATININE 1.61* 1.59*    Basename 06/07/12 0639 06/07/12 0014  TROPONINI 0.67* 0.67*   Hepatic Function Panel No results found for this basename: PROT,ALBUMIN,AST,ALT,ALKPHOS,BILITOT,BILIDIR,IBILI in the last 72 hours  Basename 06/07/12 0400  CHOL 202*   No results found for this basename: PROTIME in the last 72 hours  Imaging: Portable Chest X-ray 1 View  06/06/2012  *RADIOLOGY REPORT*  Clinical Data: Chest pain and shortness of breath.  PORTABLE CHEST - 1 VIEW  Comparison: 02/14/2012  Findings: There is chronic cardiomegaly with tortuosity of the thoracic aorta.  The lungs are hyperinflated with flattening  of diaphragm consistent with emphysema.  No acute infiltrates or effusions.  No acute osseous abnormality.  IMPRESSION: Emphysema.  Cardiomegaly.  No acute abnormalities.  Original Report Authenticated By: Gwynn Burly, M.D.    EKG:  NSR.  LBBB.    Cardiac Studies:  Echo pending Telemetry:   Pause with nausea.  3 beats NSVT--two episodes.  Intermittent LBBB.    Assessment/Plan:  1.  USAP  -  SP PCI of the OM.  See note of Dr. Excell Seltzer 2.  CKD, Stage 3 3.  Intermittent LBBB---rhythm appears stable at present.  Plan  1.  Home today 2.  Told patient not to drive until seen by Dr. Excell Seltzer. 3.  Encourage fluids. 4.  Follow up on echo. 5.  DAPT for one year or adjustment per [facility] as OP.    Reviewed need for DAPT with patient in detail.  She understands but is not so crazy about the idea.   Case also reviewed with Dr. Excell Seltzer and we agreed that she could likely be discharged.  He will follow up with her in the office.    Combined discharge time is in excess of 30 minutes.        Shawnie Pons, MD, [facility], FSCAI 06/08/2012, 10:04 AM

## 2012-06-08 NOTE — Progress Notes (Signed)
  Echocardiogram 2D Echocardiogram has been performed.  [place] 06/08/2012, 9:49 AM

## 2012-06-12 NOTE — H&P (Signed)
 HPI:  78-year-old woman presenting for followup evaluation. The patient is followed for aortic valve disease with moderate aortic stenosis and insufficiency. She has a history of hypertension and chronic kidney disease. Her last echocardiogram was in November 2012 and demonstrated low normal left ventricular function with an ejection fraction of 50%. There was LV dyssynchrony related to her left bundle branch block. There was moderate aortic stenosis and moderate aortic insufficiency noted with mild pulmonary hypertension. Her right ventricular size and systolic function were within normal limits.  The patient presents with 3-4 days of resting chest pain. This is occurred intermittently. She first noticed this after mowing her lawn 4 days ago. She felt extremely weak after she did this. She then awoke with substernal chest burning radiating to the neck and both arms. This resolved after several minutes. She had a second episode the following day and also woke her from sleep. She's had episodic "burning" in her neck at rest and with exertion. She saw Dr. Webb yesterday who prescribed nitroglycerin and advised her to followup here today. She hadn't taken nitroglycerin last night after being awoken again with chest pain. Her pain resolved within 10 minutes of taking the nitroglycerin. She's had mild discomfort in her neck intermittently today. She notes associated shortness of breath, but does have a chronic component of shortness of breath as well. She denies orthopnea, PND, or palpitations. She had edema after starting metoprolol but is now back to her baseline after starting back on atenolol.  Outpatient Encounter Prescriptions as of 06/06/2012  Medication Sig Dispense Refill  . aspirin EC 81 MG tablet Take 81 mg by mouth 2 (two) times daily.      . atenolol (TENORMIN) 50 MG tablet Take 50 mg by mouth daily.       . b complex vitamins tablet Take 1 tablet by mouth daily.        . Calcium  Carbonate-Vitamin D (CALCIUM 600 + D PO) Take 1 tablet by mouth 2 (two) times daily.       . cholecalciferol (VITAMIN D) 1000 UNITS tablet Take 5,000 Units by mouth daily.       . Cinnamon 500 MG TABS Take 1 tablet by mouth 2 (two) times daily.       . COD LIVER OIL W/VIT A & D PO Take 1 tablet by mouth 2 (two) times daily.       . DIOVAN 160 MG tablet TAKE 1 TABLET BY MOUTH TWICE A DAY  60 tablet  2  . fish oil-omega-3 fatty acids 1000 MG capsule Take 2 g by mouth daily.        . HYDROcodone-acetaminophen (VICODIN) 5-500 MG per tablet Take 1 tablet by mouth every 6 (six) hours as needed. For pain  30 tablet  1  . Krill Oil 300 MG CAPS Take 1 capsule by mouth daily.       . nisoldipine (SULAR) 17 MG 24 hr tablet TAKE 1 TABLET EVERY DAY  30 tablet  11  . vitamin E 400 UNIT capsule Take 400 Units by mouth daily.          Allergies  Allergen Reactions  . Metoprolol Swelling  . Norvasc (Amlodipine Besylate) Swelling  . Sulfonamide Derivatives Hives and Swelling    Past Medical History  Diagnosis Date  . Lower extremity edema     bilateral  . Chronic renal insufficiency   . Labile hypertension   . Aortic insufficiency   . Mitral valve regurgitation   .   Anxiety   . Major depressive disorder, single episode, severe   . Failure to thrive in childhood   . History of shingles July 2004    on the left side of her thorax and postherpetic neuralgia  . History of psoriasis   . History of recurrent UTIs   . Hx of salpingo-oophorectomy, bilateral     ROS: Negative except as per HPI  BP 193/79  Pulse 57  Ht 5' 2" (1.575 m)  Wt 60.383 kg (133 lb 1.9 oz)  BMI 24.35 kg/m2  PHYSICAL EXAM: Pt is alert and oriented, elderly woman in NAD HEENT: normal Neck: JVP - normal, carotids 2+= with a prominent right carotid bruit Lungs: CTA bilaterally CV: RRR with a grade 3/6 systolic murmur at the right upper sternal border and a soft diastolic decrescendo murmur also at the right upper sternal  border Abd: soft, NT, Positive BS, no hepatomegaly Ext: Trace pretibial edema bilaterally, distal pulses intact and equal Skin: warm/dry no rash  EKG:  Sinus bradycardia 58 beats per minute, left ventricular hypertrophy with repolarization abnormality. No significant change from previous tracing  Labs drawn 06/05/2012 pertinent for a white blood cell count of 7.3, hemoglobin 12.2, hematocrit 34.8, platelet count 279,000, glucose 102, sodium 137, potassium 3.7, calcium 10.0, creatinine 1.7. Her baseline creatinine is 2.0.  ASSESSMENT AND PLAN: 1. Unstable angina pectoris. The patient has concerning symptoms of unstable angina. I had along discussion with her and she was reluctant to go to the hospital. I have finally convinced her that hospitalization is necessary. I spoke with Dr. Webb who is her nephrologist and agrees with the plan. Fortunately, her creatinine was a little better than her baseline yesterday at 1.7 mg/dL. The patient will be admitted to a telemetry bed, started on intravenous unfractionated heparin, and scheduled for cardiac catheterization with limited diet tomorrow. She will be treated with IV nitroglycerin if her chest pain recurs at rest. I reviewed in detail the potential risks, indications, and alternatives to cardiac catheterization and possible PCI. She understands that depending on the complexity of her coronary disease, I may be able to proceed with PCI at the time of her diagnostic catheterization, but we may have to defer PCI considering her renal insufficiency. Her daughter is going to drive her to the hospital and she is with her today.  2. Malignant hypertension. Patient's blood pressure has been good at home. She has had severe white coat hypertension in the past. We'll continue to monitor. We'll place her Diovan on hold with upcoming dye load tomorrow. She will continue on nisoldipine and atenolol.  3. Aortic stenosis and insufficiency. Further evaluation tomorrow at  the time of her cardiac catheterization.  4. Disposition: Admit to the hospital with unstable angina, n.p.o. after midnight except meds, cardiac catheterization and possible PCI tomorrow.  Adanely Reynoso 06/06/2012 3:25 PM      

## 2012-06-18 ENCOUNTER — Ambulatory Visit (INDEPENDENT_AMBULATORY_CARE_PROVIDER_SITE_OTHER): Payer: Medicare Other | Admitting: Cardiovascular Disease

## 2012-06-18 ENCOUNTER — Encounter: Payer: Self-pay | Admitting: Cardiovascular Disease

## 2012-06-18 VITALS — BP 132/76 | HR 58 | Ht 61.0 in | Wt 130.0 lb

## 2012-06-18 DIAGNOSIS — I359 Nonrheumatic aortic valve disorder, unspecified: Secondary | ICD-10-CM

## 2012-06-18 DIAGNOSIS — I119 Hypertensive heart disease without heart failure: Secondary | ICD-10-CM

## 2012-06-18 DIAGNOSIS — I2510 Atherosclerotic heart disease of native coronary artery without angina pectoris: Secondary | ICD-10-CM

## 2012-06-18 NOTE — Patient Instructions (Signed)
Your physician wants you to follow-up in: 4 months. You will receive a reminder letter in the mail two months in advance. If you don't receive a letter, please call our office to schedule the follow-up appointment.  

## 2012-06-18 NOTE — Assessment & Plan Note (Signed)
Blood pressure is controlled on her current medical program. Note she has many drug intolerances

## 2012-06-18 NOTE — Progress Notes (Signed)
HPI:  78 year old woman presented for hospital followup evaluation. The patient was just hospitalized July 10 through the 12 with acute coronary syndrome. She came to the office with chest pain that was typical of unstable angina and she ultimately ruled in for non-ST elevation MI with a positive troponin. She underwent cardiac catheterization demonstrating severe single-vessel coronary artery disease and she underwent stenting of the left circumflex. The patient has chronic kidney disease and fortunately she had no evidence of acute kidney injury related to her cardiac catheterization. Her creatinine at discharge was 1.6 mg/dL which is stable from her baseline.  The patient is doing well at present. Her chest and neck pain has resolved. She did have neck pain for a few days after the procedure but this is now completely resolved. She is feeling well and has no complaints related to her heart. She specifically denies dyspnea, palpitations, edema, orthopnea, or PND. She has some residual soreness and bruising along her right forearm.  Outpatient Encounter Prescriptions as of 06/18/2012  Medication Sig Dispense Refill  . aspirin EC 81 MG tablet Take 1 tablet (81 mg total) by mouth daily.      Marland Kitchen atenolol (TENORMIN) 50 MG tablet Take 50 mg by mouth daily.       Marland Kitchen b complex vitamins tablet Take 1 tablet by mouth daily.        . Calcium Carbonate-Vitamin D (CALCIUM 600 + D PO) Take 1 tablet by mouth 2 (two) times daily.       . cholecalciferol (VITAMIN D) 1000 UNITS tablet Take 5,000 Units by mouth daily.       . Cinnamon 500 MG TABS Take 1 tablet by mouth 2 (two) times daily.       . clopidogrel (PLAVIX) 75 MG tablet Take 1 tablet (75 mg total) by mouth daily with breakfast.  30 tablet  11  . COD LIVER OIL W/VIT A & D PO Take 1 tablet by mouth 2 (two) times daily.       . fish oil-omega-3 fatty acids 1000 MG capsule Take 2 g by mouth daily.        Marland Kitchen HYDROcodone-acetaminophen (VICODIN) 5-500 MG per  tablet Take 1 tablet by mouth every 6 (six) hours as needed. For pain  30 tablet  1  . isosorbide mononitrate (IMDUR) 30 MG 24 hr tablet Take 1 tablet (30 mg total) by mouth daily.  30 tablet  6  . Krill Oil 300 MG CAPS Take 1 capsule by mouth daily.       . nisoldipine (SULAR) 17 MG 24 hr tablet TAKE 1 TABLET EVERY DAY  30 tablet  11  . nitroGLYCERIN (NITROSTAT) 0.4 MG SL tablet Place 1 tablet (0.4 mg total) under the tongue every 5 (five) minutes x 3 doses as needed for chest pain.  25 tablet  4  . valsartan (DIOVAN) 160 MG tablet Tomorrow (06/09/12) and Sunday (06/10/12), take 1 tablet by mouth once daily. On Monday 06/11/12, you may go back to taking 1 tablet twice a day.      . vitamin E 400 UNIT capsule Take 400 Units by mouth daily.          Allergies  Allergen Reactions  . Metoprolol Swelling  . Norvasc (Amlodipine Besylate) Swelling  . Sulfonamide Derivatives Hives, Itching and Swelling  . Statins     Myalgias - "I won't take them"    Past Medical History  Diagnosis Date  . Lower extremity edema  bilateral  . Chronic renal insufficiency   . Labile hypertension   . Anxiety   . Failure to thrive in childhood   . History of shingles July 2004    on the left side of her thorax and postherpetic neuralgia  . History of psoriasis   . History of recurrent UTIs   . Major depressive disorder, single episode, severe 05-01-2003    "when my husband died"  . LBBB (left bundle branch block)     intermittent  . Valvular heart disease     Echo 05/2012 - thickened AV with mildly restricted motion, mild AI, mild MR, mod TR  . CAD (coronary artery disease)     a. NSTEMI 05/2012 s/p DES to OM 06/07/12 (residual severe LCx subbranch dz for med rx unless refractory angina recurs)    ROS: Negative except as per HPI  BP 132/76  Pulse 58  Ht 5\' 1"  (1.549 m)  Wt 58.968 kg (130 lb)  BMI 24.56 kg/m2  PHYSICAL EXAM: Pt is alert and oriented, pleasant elderly woman in NAD HEENT: normal Neck: JVP  - normal, carotids 2+= without bruits Lungs: CTA bilaterally CV: RRR with a short grade 2/6 systolic murmur at the right upper sternal border. There is no diastolic murmur appreciated. Abd: soft, NT, Positive BS, no hepatomegaly Ext: no C/C/E, distal pulses intact and equal. The palmar aspect of the right forearm has ecchymoses without firm hematoma. Skin: warm/dry no rash  EKG:  Sinus bradycardia 56 beats per minute, LVH with repolarization abnormality unchanged from prior tracings.  ASSESSMENT AND PLAN:

## 2012-06-18 NOTE — Assessment & Plan Note (Signed)
The patient is stable without recurrent angina following her recent PCI procedure for treatment of her non-ST elevation infarction. Her LV function was normal by echocardiogram during her hospitalization. She is tolerating dual antiplatelet therapy without bleeding problems. Will continue her same medications and I like to see her back in followup in 4 months.

## 2012-06-18 NOTE — Assessment & Plan Note (Signed)
Mild by echo and exam with associated mild aortic insufficiency. Will continue to follow clinically.

## 2012-06-26 ENCOUNTER — Encounter: Payer: Self-pay | Admitting: Cardiovascular Disease

## 2012-08-10 ENCOUNTER — Telehealth: Payer: Self-pay | Admitting: Cardiovascular Disease

## 2012-08-10 ENCOUNTER — Encounter (HOSPITAL_COMMUNITY): Payer: Self-pay | Admitting: Emergency Medicine

## 2012-08-10 ENCOUNTER — Emergency Department (HOSPITAL_COMMUNITY): Payer: Medicare Other

## 2012-08-10 ENCOUNTER — Inpatient Hospital Stay (HOSPITAL_COMMUNITY): Payer: Medicare Other

## 2012-08-10 ENCOUNTER — Inpatient Hospital Stay (HOSPITAL_COMMUNITY)
Admission: EM | Admit: 2012-08-10 | Discharge: 2012-08-13 | Disposition: A | Discharge status: Home / Self Care | Payer: Medicare Other | Source: Home / Self Care | Attending: Cardiology | Admitting: Cardiology

## 2012-08-10 DIAGNOSIS — Z87891 Personal history of nicotine dependence: Secondary | ICD-10-CM

## 2012-08-10 DIAGNOSIS — N184 Chronic kidney disease, stage 4 (severe): Secondary | ICD-10-CM | POA: Diagnosis present

## 2012-08-10 DIAGNOSIS — I200 Unstable angina: Secondary | ICD-10-CM | POA: Diagnosis present

## 2012-08-10 DIAGNOSIS — J9600 Acute respiratory failure, unspecified whether with hypoxia or hypercapnia: Secondary | ICD-10-CM | POA: Diagnosis present

## 2012-08-10 DIAGNOSIS — N179 Acute kidney failure, unspecified: Secondary | ICD-10-CM | POA: Diagnosis present

## 2012-08-10 DIAGNOSIS — I509 Heart failure, unspecified: Secondary | ICD-10-CM | POA: Diagnosis present

## 2012-08-10 DIAGNOSIS — I2510 Atherosclerotic heart disease of native coronary artery without angina pectoris: Secondary | ICD-10-CM | POA: Diagnosis present

## 2012-08-10 DIAGNOSIS — Z9861 Coronary angioplasty status: Secondary | ICD-10-CM

## 2012-08-10 DIAGNOSIS — I447 Left bundle-branch block, unspecified: Secondary | ICD-10-CM | POA: Diagnosis present

## 2012-08-10 DIAGNOSIS — Z882 Allergy status to sulfonamides status: Secondary | ICD-10-CM

## 2012-08-10 DIAGNOSIS — Z79899 Other long term (current) drug therapy: Secondary | ICD-10-CM

## 2012-08-10 DIAGNOSIS — B0229 Other postherpetic nervous system involvement: Secondary | ICD-10-CM | POA: Diagnosis present

## 2012-08-10 DIAGNOSIS — Z7982 Long term (current) use of aspirin: Secondary | ICD-10-CM

## 2012-08-10 DIAGNOSIS — Z888 Allergy status to other drugs, medicaments and biological substances status: Secondary | ICD-10-CM

## 2012-08-10 DIAGNOSIS — J449 Chronic obstructive pulmonary disease, unspecified: Secondary | ICD-10-CM | POA: Diagnosis present

## 2012-08-10 DIAGNOSIS — Z23 Encounter for immunization: Secondary | ICD-10-CM

## 2012-08-10 DIAGNOSIS — I4891 Unspecified atrial fibrillation: Secondary | ICD-10-CM | POA: Diagnosis present

## 2012-08-10 DIAGNOSIS — R079 Chest pain, unspecified: Secondary | ICD-10-CM

## 2012-08-10 DIAGNOSIS — I252 Old myocardial infarction: Secondary | ICD-10-CM

## 2012-08-10 DIAGNOSIS — E785 Hyperlipidemia, unspecified: Secondary | ICD-10-CM | POA: Diagnosis present

## 2012-08-10 DIAGNOSIS — F411 Generalized anxiety disorder: Secondary | ICD-10-CM | POA: Diagnosis present

## 2012-08-10 DIAGNOSIS — D649 Anemia, unspecified: Secondary | ICD-10-CM | POA: Diagnosis present

## 2012-08-10 DIAGNOSIS — N390 Urinary tract infection, site not specified: Secondary | ICD-10-CM | POA: Diagnosis not present

## 2012-08-10 DIAGNOSIS — J4489 Other specified chronic obstructive pulmonary disease: Secondary | ICD-10-CM | POA: Diagnosis present

## 2012-08-10 DIAGNOSIS — I129 Hypertensive chronic kidney disease with stage 1 through stage 4 chronic kidney disease, or unspecified chronic kidney disease: Secondary | ICD-10-CM | POA: Diagnosis present

## 2012-08-10 DIAGNOSIS — I5033 Acute on chronic diastolic (congestive) heart failure: Secondary | ICD-10-CM

## 2012-08-10 DIAGNOSIS — E875 Hyperkalemia: Secondary | ICD-10-CM | POA: Diagnosis not present

## 2012-08-10 DIAGNOSIS — Z7902 Long term (current) use of antithrombotics/antiplatelets: Secondary | ICD-10-CM

## 2012-08-10 DIAGNOSIS — I359 Nonrheumatic aortic valve disorder, unspecified: Secondary | ICD-10-CM | POA: Diagnosis present

## 2012-08-10 DIAGNOSIS — I5021 Acute systolic (congestive) heart failure: Secondary | ICD-10-CM

## 2012-08-10 DIAGNOSIS — N189 Chronic kidney disease, unspecified: Secondary | ICD-10-CM

## 2012-08-10 DIAGNOSIS — B957 Other staphylococcus as the cause of diseases classified elsewhere: Secondary | ICD-10-CM | POA: Diagnosis not present

## 2012-08-10 DIAGNOSIS — I428 Other cardiomyopathies: Secondary | ICD-10-CM | POA: Diagnosis present

## 2012-08-10 DIAGNOSIS — R0600 Dyspnea, unspecified: Secondary | ICD-10-CM

## 2012-08-10 DIAGNOSIS — I5043 Acute on chronic combined systolic (congestive) and diastolic (congestive) heart failure: Secondary | ICD-10-CM | POA: Diagnosis present

## 2012-08-10 DIAGNOSIS — E871 Hypo-osmolality and hyponatremia: Secondary | ICD-10-CM | POA: Diagnosis present

## 2012-08-10 DIAGNOSIS — D72829 Elevated white blood cell count, unspecified: Secondary | ICD-10-CM | POA: Diagnosis present

## 2012-08-10 DIAGNOSIS — I10 Essential (primary) hypertension: Secondary | ICD-10-CM | POA: Diagnosis present

## 2012-08-10 LAB — URINE MICROSCOPIC-ADD ON

## 2012-08-10 LAB — CBC WITH DIFFERENTIAL/PLATELET
Basophils Absolute: 0 10*3/uL (ref 0.0–0.1)
Basophils Relative: 0 % (ref 0–1)
HCT: 34.2 % — ABNORMAL LOW (ref 36.0–46.0)
MCHC: 34.5 g/dL (ref 30.0–36.0)
MCV: 87.2 fL (ref 78.0–100.0)
Monocytes Relative: 5 % (ref 3–12)
Neutro Abs: 10.1 10*3/uL — ABNORMAL HIGH (ref 1.7–7.7)
Neutrophils Relative %: 87 % — ABNORMAL HIGH (ref 43–77)
WBC: 11.5 10*3/uL — ABNORMAL HIGH (ref 4.0–10.5)

## 2012-08-10 LAB — URINALYSIS, ROUTINE W REFLEX MICROSCOPIC
Nitrite: NEGATIVE
Specific Gravity, Urine: 1.015 (ref 1.005–1.030)
pH: 6 (ref 5.0–8.0)

## 2012-08-10 LAB — BLOOD GAS, ARTERIAL
Bicarbonate: 23.6 mEq/L (ref 20.0–24.0)
O2 Saturation: 99.2 %
Patient temperature: 37
TCO2: 21.1 mmol/L (ref 0–100)

## 2012-08-10 LAB — TROPONIN I
Troponin I: <0.3 ng/mL (ref <0.30)
Troponin I: <0.3 ng/mL (ref <0.30)
Troponin I: <0.3 ng/mL (ref <0.30)

## 2012-08-10 LAB — BASIC METABOLIC PANEL
BUN: 26 mg/dL — ABNORMAL HIGH (ref 6–23)
Chloride: 93 mEq/L — ABNORMAL LOW (ref 96–112)
GFR calc Af Amer: 31 mL/min — ABNORMAL LOW (ref >90)
Potassium: 4 mEq/L (ref 3.5–5.1)
Sodium: 130 mEq/L — ABNORMAL LOW (ref 135–145)

## 2012-08-10 LAB — CK TOTAL AND CKMB (NOT AT ARMC)
CK, MB: 3.9 ng/mL (ref 0.3–4.0)
Relative Index: INVALID (ref 0.0–2.5)
Relative Index: INVALID (ref 0.0–2.5)
Total CK: 68 U/L (ref 7–177)

## 2012-08-10 LAB — MRSA PCR SCREENING: MRSA by PCR: NEGATIVE

## 2012-08-10 LAB — PRO B NATRIURETIC PEPTIDE: Pro B Natriuretic peptide (BNP): 13275 pg/mL — ABNORMAL HIGH (ref 0–450)

## 2012-08-10 MED ORDER — AMIODARONE LOAD VIA INFUSION
150.0000 mg | Freq: Once | INTRAVENOUS | Status: DC
  Administered 2012-08-10: 150 mg via INTRAVENOUS
  Filled 2012-08-10: qty 83.34

## 2012-08-10 MED ORDER — ATENOLOL 25 MG PO TABS
25.0000 mg | ORAL_TABLET | Freq: Every day | ORAL | Status: DC
  Administered 2012-08-11 – 2012-08-12 (×2): 25 mg via ORAL
  Filled 2012-08-10 (×2): qty 1

## 2012-08-10 MED ORDER — ALPRAZOLAM 0.25 MG PO TABS
0.2500 mg | ORAL_TABLET | Freq: Two times a day (BID) | ORAL | Status: DC | PRN
  Administered 2012-08-11 – 2012-08-13 (×2): 0.25 mg via ORAL
  Filled 2012-08-10 (×2): qty 1

## 2012-08-10 MED ORDER — SODIUM CHLORIDE 0.9 % IJ SOLN
3.0000 mL | Freq: Two times a day (BID) | INTRAMUSCULAR | Status: DC
  Administered 2012-08-10: 3 mL via INTRAVENOUS

## 2012-08-10 MED ORDER — AMIODARONE HCL IN DEXTROSE 360-4.14 MG/200ML-% IV SOLN
30.0000 mg/h | INTRAVENOUS | Status: DC
  Administered 2012-08-11: 30 mg/h via INTRAVENOUS
  Filled 2012-08-10 (×3): qty 200

## 2012-08-10 MED ORDER — NITROGLYCERIN IN D5W 200-5 MCG/ML-% IV SOLN: 3.0000 ug/min | INTRAVENOUS | Status: DC

## 2012-08-10 MED ORDER — CLOPIDOGREL BISULFATE 75 MG PO TABS
75.0000 mg | ORAL_TABLET | Freq: Every day | ORAL | Status: DC
  Administered 2012-08-11 – 2012-08-13 (×3): 75 mg via ORAL
  Filled 2012-08-10 (×5): qty 1

## 2012-08-10 MED ORDER — NITROGLYCERIN 0.4 MG SL SUBL
SUBLINGUAL_TABLET | SUBLINGUAL | Status: AC
  Administered 2012-08-10: 0.4 mg via SUBLINGUAL
  Filled 2012-08-10: qty 25

## 2012-08-10 MED ORDER — INFLUENZA VIRUS VACC SPLIT PF IM SUSP: 0.5000 mL | INTRAMUSCULAR | Status: DC | PRN

## 2012-08-10 MED ORDER — NITROGLYCERIN IN D5W 200-5 MCG/ML-% IV SOLN
3.0000 ug/min | Freq: Once | INTRAVENOUS | Status: AC
  Administered 2012-08-10: 3 ug/min via INTRAVENOUS
  Filled 2012-08-10: qty 250

## 2012-08-10 MED ORDER — HYDROCODONE-ACETAMINOPHEN 5-325 MG PO TABS
1.0000 | ORAL_TABLET | Freq: Four times a day (QID) | ORAL | Status: DC | PRN
  Administered 2012-08-10: 1 via ORAL
  Filled 2012-08-10: qty 1

## 2012-08-10 MED ORDER — DILTIAZEM HCL 100 MG IV SOLR
5.0000 mg/h | INTRAVENOUS | Status: DC
  Filled 2012-08-10: qty 100

## 2012-08-10 MED ORDER — HEPARIN BOLUS VIA INFUSION
3000.0000 [IU] | Freq: Once | INTRAVENOUS | Status: AC
  Administered 2012-08-10: 3000 [IU] via INTRAVENOUS

## 2012-08-10 MED ORDER — NITROGLYCERIN 0.4 MG SL SUBL
0.4000 mg | SUBLINGUAL_TABLET | SUBLINGUAL | Status: DC | PRN
  Administered 2012-08-10: 0.4 mg via SUBLINGUAL

## 2012-08-10 MED ORDER — AMIODARONE HCL IN DEXTROSE 360-4.14 MG/200ML-% IV SOLN
60.0000 mg/h | INTRAVENOUS | Status: AC
  Administered 2012-08-10 – 2012-08-11 (×2): 60 mg/h via INTRAVENOUS
  Filled 2012-08-10 (×2): qty 200

## 2012-08-10 MED ORDER — ONDANSETRON HCL 4 MG/2ML IJ SOLN: 4.0000 mg | Freq: Four times a day (QID) | INTRAMUSCULAR | Status: DC | PRN

## 2012-08-10 MED ORDER — HEPARIN (PORCINE) IN NACL 100-0.45 UNIT/ML-% IJ SOLN
1050.0000 [IU]/h | INTRAMUSCULAR | Status: DC
  Administered 2012-08-10 – 2012-08-11 (×2): 1000 [IU]/h via INTRAVENOUS
  Filled 2012-08-10 (×4): qty 250

## 2012-08-10 MED ORDER — NITROGLYCERIN 0.4 MG SL SUBL: 0.4000 mg | SUBLINGUAL_TABLET | SUBLINGUAL | Status: DC | PRN

## 2012-08-10 MED ORDER — FUROSEMIDE 10 MG/ML IJ SOLN
80.0000 mg | Freq: Once | INTRAMUSCULAR | Status: AC
  Administered 2012-08-10: 80 mg via INTRAVENOUS

## 2012-08-10 MED ORDER — ASPIRIN EC 81 MG PO TBEC
81.0000 mg | DELAYED_RELEASE_TABLET | Freq: Every day | ORAL | Status: DC
  Administered 2012-08-10 – 2012-08-12 (×3): 81 mg via ORAL
  Filled 2012-08-10 (×3): qty 1

## 2012-08-10 MED ORDER — ATENOLOL 50 MG PO TABS
50.0000 mg | ORAL_TABLET | Freq: Every day | ORAL | Status: DC
  Filled 2012-08-10: qty 1

## 2012-08-10 MED ORDER — FUROSEMIDE 10 MG/ML IJ SOLN
80.0000 mg | Freq: Once | INTRAMUSCULAR | Status: DC
  Filled 2012-08-10: qty 8

## 2012-08-10 MED ORDER — ASPIRIN 325 MG PO TABS: 324.0000 mg | ORAL_TABLET | Freq: Once | ORAL | Status: AC

## 2012-08-10 MED ORDER — ENOXAPARIN SODIUM 30 MG/0.3ML ~~LOC~~ SOLN
30.0000 mg | SUBCUTANEOUS | Status: DC
  Filled 2012-08-10: qty 0.3

## 2012-08-10 MED ORDER — ASPIRIN 81 MG PO CHEW
CHEWABLE_TABLET | ORAL | Status: AC
  Administered 2012-08-10: 324 mg
  Filled 2012-08-10: qty 4

## 2012-08-10 MED ORDER — ACETAMINOPHEN 325 MG PO TABS: 650.0000 mg | ORAL_TABLET | ORAL | Status: DC | PRN

## 2012-08-10 MED ORDER — SODIUM CHLORIDE 0.9 % IV SOLN: 250.0000 mL | INTRAVENOUS | Status: DC | PRN

## 2012-08-10 MED ORDER — SODIUM CHLORIDE 0.9 % IJ SOLN: 3.0000 mL | INTRAMUSCULAR | Status: DC | PRN

## 2012-08-10 MED ORDER — PROMETHAZINE HCL 25 MG/ML IJ SOLN
6.2500 mg | Freq: Four times a day (QID) | INTRAMUSCULAR | Status: DC | PRN
Start: 2012-08-10 — End: 2012-08-13
  Administered 2012-08-10: 6.25 mg via INTRAVENOUS
  Filled 2012-08-10 (×2): qty 1

## 2012-08-10 MED ORDER — SODIUM CHLORIDE 0.9 % IV SOLN
Freq: Once | INTRAVENOUS | Status: AC
  Administered 2012-08-10: 1000 mL via INTRAVENOUS

## 2012-08-10 MED ORDER — FUROSEMIDE 10 MG/ML IJ SOLN
40.0000 mg | Freq: Two times a day (BID) | INTRAMUSCULAR | Status: DC
  Administered 2012-08-11: 40 mg via INTRAVENOUS
  Filled 2012-08-10: qty 8
  Filled 2012-08-10 (×3): qty 4

## 2012-08-10 NOTE — Telephone Encounter (Signed)
I spoke with the pt's daughter and the pt has been having problems since Wednesday.  Wednesday the pt had dizziness and her BP was 96/60, pulse 88. The pt did not take her Diovan, Sular or Atenolol that day.  Yesterday the pt did take her medications but her BP and pulse continued to fluctuate.  Today the pt is having nausea and vomiting, labored breathing when she exerts herself and her pulse is running as high as 120.  Most recent BP 157/88 and pulse 106.  I made the pt's daughter aware that the pt needs to go to the ER for further evaluation. The pt's daughter agrees with plan.

## 2012-08-10 NOTE — Telephone Encounter (Signed)
New problem:  C/O weak , B/p today  157/88 pulse rate 106  , nausea, sob .

## 2012-08-10 NOTE — ED Provider Notes (Addendum)
History  This chart was scribed for Deanna Hutching, MD by Shari Heritage. The patient was seen in room APA17/APA17. Patient's care was started at 1122.     CSN: [id number]  Arrival date & time 08/10/12  1031   First MD Initiated Contact with Patient 08/10/12 1122      Chief Complaint  Patient presents with  . Shortness of Breath  . Fatigue    The history is provided by the patient and a relative (daughter). No language interpreter was used.    [name] is a 78 y.o. female who presents to the Emergency Department complaining of gradually worsening, constant SOB onset 1 week ago. Associated symptoms include gradually worsening generalized weakness, decreased appetite, lightheadedness with position changes, nausea and emesis with fluid intake. Patient's daughter says that her HR has been elevated intermittently for the past few days. Patient has also had low pressure which dropped significantly on 05/17/2023. She didn't take her Sular, Atenolol and Diovan on 05-17-23, she took all three medications yesterday, and hasn't taken them this morning. Patient has a medical history of bilateral lower extremity edema, chronic renal insufficiency, coronary artery disease, valvular heart disease and left bundle branch block. Her surgical history includes salpingoophorectomy, appendectomy, breast cyst excision, abdominal hysterectomy, cardiac extraction and cardiac surgery. Patient is a former smoker (quit date: 09/01/1979).  PCP - [facility] Cardiologist - Tonny Bollman   Past Medical History  Diagnosis Date  . Lower extremity edema     bilateral  . Chronic renal insufficiency   . Labile hypertension   . Anxiety   . Failure to thrive in childhood   . History of shingles July 2004    on the left side of her thorax and postherpetic neuralgia  . History of psoriasis   . History of recurrent UTIs   . Major depressive disorder, single episode, severe 2003-05-17    "when my husband died"  . LBBB  (left bundle branch block)     intermittent  . Valvular heart disease     Echo 05/2012 - thickened AV with mildly restricted motion, mild AI, mild MR, mod TR  . CAD (coronary artery disease)     a. NSTEMI 05/2012 s/p DES to OM 06/07/12 (residual severe LCx subbranch dz for med rx unless refractory angina recurs)    Past Surgical History  Procedure Date  . Salpingoophorectomy     "cyst removed"  . Appendectomy   . Breast cyst excision   . Abdominal hysterectomy   . Cataract extraction, bilateral ~ 2009-05-16  . Cardiac surgery     Family History  Problem Relation Age of Onset  . Diabetes    . Cancer    . Lung cancer      History  Substance Use Topics  . Smoking status: Former Smoker -- 1.5 packs/day for 30 years    Types: Cigarettes    Quit date: 09/01/1979  . Smokeless tobacco: Never Used  . Alcohol Use: Yes     06/06/12 "mixed drink once or twice a year.to celebrate"    OB History    Grav Para Term Preterm Abortions TAB SAB Ect Mult Living                  Review of Systems A complete 10 system review of systems was obtained and all systems are negative except as noted in the HPI and PMH.   Allergies  Metoprolol; Norvasc; Sulfonamide derivatives; and Statins  Home Medications   Current Outpatient Rx  Name Route Sig Dispense Refill  . ASPIRIN EC 81 MG PO TBEC Oral Take 1 tablet (81 mg total) by mouth daily.    . ATENOLOL 50 MG PO TABS Oral Take 50 mg by mouth daily.     . B COMPLEX PO TABS Oral Take 1 tablet by mouth daily.      Marland Kitchen CALCIUM 600 + D PO Oral Take 1 tablet by mouth 2 (two) times daily.     Marland Kitchen VITAMIN D 1000 UNITS PO TABS Oral Take 5,000 Units by mouth daily.     Marland Kitchen CINNAMON 500 MG PO TABS Oral Take 1 tablet by mouth 2 (two) times daily.     Marland Kitchen CLOPIDOGREL BISULFATE 75 MG PO TABS Oral Take 1 tablet (75 mg total) by mouth daily with breakfast. 30 tablet 11  . COD LIVER OIL W/VIT A & D PO Oral Take 1 tablet by mouth 2 (two) times daily.     . OMEGA-3 FATTY  ACIDS 1000 MG PO CAPS Oral Take 2 g by mouth daily.      Marland Kitchen HYDROCODONE-ACETAMINOPHEN 5-500 MG PO TABS Oral Take 1 tablet by mouth every 6 (six) hours as needed. For pain 30 tablet 1  . ISOSORBIDE MONONITRATE ER 30 MG PO TB24 Oral Take 1 tablet (30 mg total) by mouth daily. 30 tablet 6  . KRILL OIL 300 MG PO CAPS Oral Take 1 capsule by mouth daily.     Marland Kitchen NISOLDIPINE ER 17 MG PO TB24  TAKE 1 TABLET EVERY DAY 30 tablet 11  . NITROGLYCERIN 0.4 MG SL SUBL Sublingual Place 1 tablet (0.4 mg total) under the tongue every 5 (five) minutes x 3 doses as needed for chest pain. 25 tablet 4  . VALSARTAN 160 MG PO TABS  Tomorrow (06/09/12) and Sunday (06/10/12), take 1 tablet by mouth once daily. On Monday 06/11/12, you may go back to taking 1 tablet twice a day.    Marland Kitchen VITAMIN E 400 UNITS PO CAPS Oral Take 400 Units by mouth daily.        BP 140/95  Pulse 102  Temp 97.7 F (36.5 C) (Oral)  Resp 22  Ht 5' 1.5" (1.562 m)  Wt 124 lb (56.246 kg)  BMI 23.05 kg/m2  SpO2 96%  Physical Exam  Nursing note and vitals reviewed. Constitutional: She is oriented to person, place, and time. She appears well-developed and well-nourished.       Pale looking, but alert.  HENT:  Head: Normocephalic and atraumatic.  Eyes: Conjunctivae normal and EOM are normal. Pupils are equal, round, and reactive to light.  Neck: Normal range of motion. Neck supple.  Cardiovascular: Normal rate and normal heart sounds.  An irregularly irregular rhythm present.  Pulmonary/Chest: Effort normal and breath sounds normal.  Abdominal: Soft. Bowel sounds are normal.  Musculoskeletal: Normal range of motion.  Neurological: She is alert and oriented to person, place, and time.  Skin: Skin is warm and dry.  Psychiatric: She has a normal mood and affect.    ED Course  Procedures (including critical care time) DIAGNOSTIC STUDIES: Oxygen Saturation is 96% on room air, adequate by my interpretation.    COORDINATION OF CARE: 11:50am- Patient  informed of current plan for treatment and evaluation and agrees with plan at this time. Will order chest X-ray, troponin, CBC, basic metabolic panel and urinalysis.   2:56pm- Consult with hospitalist who has agreed to admit patient.  Results for orders placed during the hospital encounter of 08/10/12  CBC  WITH DIFFERENTIAL      Component Value Range   WBC 11.5 (*) 4.0 - 10.5 K/uL   RBC 3.92  3.87 - 5.11 MIL/uL   Hemoglobin 11.8 (*) 12.0 - 15.0 g/dL   HCT 16.1 (*) 09.6 - 04.5 %   MCV 87.2  78.0 - 100.0 fL   MCH 30.1  26.0 - 34.0 pg   MCHC 34.5  30.0 - 36.0 g/dL   RDW 40.9  81.1 - 91.4 %   Platelets 285  150 - 400 K/uL   Neutrophils Relative 87 (*) 43 - 77 %   Neutro Abs 10.1 (*) 1.7 - 7.7 K/uL   Lymphocytes Relative 7 (*) 12 - 46 %   Lymphs Abs 0.8  0.7 - 4.0 K/uL   Monocytes Relative 5  3 - 12 %   Monocytes Absolute 0.6  0.1 - 1.0 K/uL   Eosinophils Relative 0  0 - 5 %   Eosinophils Absolute 0.0  0.0 - 0.7 K/uL   Basophils Relative 0  0 - 1 %   Basophils Absolute 0.0  0.0 - 0.1 K/uL  BASIC METABOLIC PANEL      Component Value Range   Sodium 130 (*) 135 - 145 mEq/L   Potassium 4.0  3.5 - 5.1 mEq/L   Chloride 93 (*) 96 - 112 mEq/L   CO2 25  19 - 32 mEq/L   Glucose, Bld 152 (*) 70 - 99 mg/dL   BUN 26 (*) 6 - 23 mg/dL   Creatinine, Ser 7.82 (*) 0.50 - 1.10 mg/dL   Calcium 9.4  8.4 - 95.6 mg/dL   GFR calc non Af Amer 27 (*) >90 mL/min   GFR calc Af Amer 31 (*) >90 mL/min  URINALYSIS, ROUTINE W REFLEX MICROSCOPIC      Component Value Range   Color, Urine YELLOW  YELLOW   APPearance CLEAR  CLEAR   Specific Gravity, Urine 1.015  1.005 - 1.030   pH 6.0  5.0 - 8.0   Glucose, UA NEGATIVE  NEGATIVE mg/dL   Hgb urine dipstick NEGATIVE  NEGATIVE   Bilirubin Urine NEGATIVE  NEGATIVE   Ketones, ur NEGATIVE  NEGATIVE mg/dL   Protein, ur 30 (*) NEGATIVE mg/dL   Urobilinogen, UA 0.2  0.0 - 1.0 mg/dL   Nitrite NEGATIVE  NEGATIVE   Leukocytes, UA TRACE (*) NEGATIVE  TROPONIN I       Component Value Range   Troponin I <0.30  <0.30 ng/mL  URINE MICROSCOPIC-ADD ON      Component Value Range   Squamous Epithelial / LPF FEW (*) RARE   WBC, UA 3-6  <3 WBC/hpf   Bacteria, UA FEW (*) RARE    Dg Chest Port 1 View  08/10/2012  *RADIOLOGY REPORT*  Clinical Data: Shortness of breath.  PORTABLE CHEST - 1 VIEW  Comparison: 06/06/2012 and 02/14/2012.  Findings: 1210 hours.  Cardiomegaly and aortic tortuosity appear stable.  There is stable biapical pleural parenchymal scarring.  No superimposed airspace disease, edema or pleural effusion is seen. Osseous structures appear unchanged.  Telemetry leads overlie the chest.  IMPRESSION: Stable cardiomegaly and chronic biapical lung disease.  No acute cardiopulmonary process.   Original Report Authenticated By: Gerrianne Scale, M.D.    CRITICAL CARE Performed by: [name]  ?  Total critical care time: 60  Critical care time was exclusive of separately billable procedures and treating other patients.  Critical care was necessary to treat or prevent imminent or life-threatening deterioration.  Critical  care was time spent personally by me on the following activities: development of treatment plan with patient and/or surrogate as well as nursing, discussions with consultants, evaluation of patient's response to treatment, examination of patient, obtaining history from patient or surrogate, ordering and performing treatments and interventions, ordering and review of laboratory studies, ordering and review of radiographic studies, pulse oximetry and re-evaluation of patient's condition.  No diagnosis found.    MDM  New-onset atrial fibrillation.  Patient is hemodynamically stable but complains of weakness and dyspnea.  Admit to hospitalist.  1545: Called to the bedside by nurse. Patient was dyspneic, diaphoretic and pale. Rhythm showed atrial fibrillation rate of approximately 120. Differential diagnosis includes acute coronary  syndrome, myocardial infarction, congestive heart failure, pulmonary embolus. Patient did not want heroics done in regards to intubation. Chest x-ray supports flash pulmonary edema. PO aspirin given. IV nitroglycerin at 3 mcg per minute, IV heparin bolus and drip, Lasix 80 mg IV. BiPAP initiated.  Discussed with Dr. Juanito Doom in [place]. He will accept admission.    I personally performed the services described in this documentation, which was scribed in my presence. The recorded information has been reviewed and considered.    Deanna Hutching, MD 08/10/12 1540  Deanna Hutching, MD 08/10/12 [phone]

## 2012-08-10 NOTE — ED Notes (Signed)
BiPAP applied.

## 2012-08-10 NOTE — Progress Notes (Signed)
Pt. Was taken off bipap by RN. MD is aware as stated by RN. Pt. Was placed on 2L nasal cannula by RN. Pt. Tolerating well at this time.

## 2012-08-10 NOTE — ED Notes (Signed)
Pt c/o gradual onset of weakness and sob for the last week.

## 2012-08-10 NOTE — H&P (Addendum)
Patient ID: [name] MRN: [id number], DOB/AGE: 07-17-1930   Admit date: 08/10/2012   Primary Physician: [name], MD Primary Cardiologist: Judie Petit. Excell Seltzer, MD  Pt. Profile:  78 y/o female with h/o CAD s/p NSTEMI and PCI in 05/2012 who presents on transfer from [facility] with afib and chf.  Problem List  Past Medical History  Diagnosis Date  . Lower extremity edema     bilateral  . CKD (chronic kidney disease), stage III     a. baseline creat 1.6->2  . Labile hypertension   . Anxiety   . Failure to thrive in childhood   . History of shingles July 2004    on the left side of her thorax and postherpetic neuralgia  . History of psoriasis   . History of recurrent UTIs   . Major depressive disorder, single episode, severe 05/08/03    "when my husband died"  . Valvular heart disease     Echo 05/2012 - thickened AV with mildly restricted motion, mild AI, mild MR, mod TR  . CAD (coronary artery disease)     a. NSTEMI 05/2012 s/p DES to OM 06/07/12 (residual severe LCx subbranch dz for med rx unless refractory angina recurs)  . LBBB (left bundle branch block)     intermittent  . Chronic diastolic CHF (congestive heart failure)     a. 05/2012 Echo: EF 60-65%, Gr 1 DD, mild AI/MR, Mod TR    Past Surgical History  Procedure Date  . Salpingoophorectomy     "cyst removed"  . Appendectomy   . Breast cyst excision   . Abdominal hysterectomy   . Cataract extraction, bilateral ~ 2009-05-07  . Cardiac surgery     Allergies  Allergies  Allergen Reactions  . Metoprolol Swelling  . Norvasc (Amlodipine Besylate) Swelling  . Sulfonamide Derivatives Hives, Itching and Swelling  . Statins     Myalgias - "I won't take them"   HPI  78 year old female with recent history of non-ST segment elevation myocardial infarction in July of this year. At that time she underwent successful PCI and drug-eluting stent placement to the obtuse marginal. She was noted to have severe residual disease in a  sub-branch of the left circumflex and this is been medically managed. She was last seen in clinic by Dr. Excell Seltzer at which time she was doing well. Unfortunately, over the past week, she has noted increasing dyspnea on exertion and fatigue. For the past 3-4 days, she has also noticed palpitations and tachycardia. 2 days ago she had an episode of nitrate responsive chest pain. Beginning yesterday she started experiencing nausea and this morning had vomiting as well. In the setting of this constellation of symptoms, she has become progressively more weak and her family decided to take her to the ER for evaluation. At [facility], she was found to be in A. fib with RVR with a left bundle branch block. Her chest x-ray showed pulmonary edema and she was treated with aspirin, heparin, Lasix, and nitroglycerin. She was also placed on BiPAP. She was transferred to [facility] for further evaluation. She currently remains on BiPAP and denies chest pain. She is not on anything for rate control for atrial fibrillation at this point and rates are in the low 100s to one teens.  Home Medications  Prior to Admission medications   Medication Sig Start Date End Date Taking? Authorizing Provider  aspirin EC 81 MG tablet Take 1 tablet (81 mg total) by mouth daily. 06/08/12  Yes  Dayna N Dunn, PA  atenolol (TENORMIN) 50 MG tablet Take 50 mg by mouth daily.  04/15/12  Yes Historical Provider, MD  b complex vitamins tablet Take 1 tablet by mouth daily.     Yes Historical Provider, MD  Cholecalciferol (VITAMIN D-3) 5000 UNITS TABS Take 1 tablet by mouth daily.   Yes Historical Provider, MD  Cinnamon 500 MG TABS Take 1 tablet by mouth 2 (two) times daily.    Yes Historical Provider, MD  clopidogrel (PLAVIX) 75 MG tablet Take 1 tablet (75 mg total) by mouth daily with breakfast. 06/08/12 06/08/13 Yes Dayna N Dunn, PA  Cod Liver Oil CAPS Take 1 capsule by mouth daily.   Yes Historical Provider, MD  fish oil-omega-3 fatty acids 1000 MG  capsule Take 1 g by mouth 2 (two) times daily.    Yes Historical Provider, MD  isosorbide mononitrate (IMDUR) 30 MG 24 hr tablet Take 1 tablet (30 mg total) by mouth daily. 06/08/12 06/08/13 Yes Dayna N Dunn, PA  Krill Oil 300 MG CAPS Take 1 capsule by mouth daily.    Yes Historical Provider, MD  L-Lysine 500 MG CAPS Take 1 capsule by mouth 2 (two) times daily.   Yes Historical Provider, MD  nisoldipine (SULAR) 17 MG 24 hr tablet TAKE 1 TABLET EVERY DAY 04/09/12  Yes [name], MD  nitroGLYCERIN (NITROSTAT) 0.4 MG SL tablet Place 1 tablet (0.4 mg total) under the tongue every 5 (five) minutes x 3 doses as needed for chest pain. 06/08/12 06/08/13 Yes Dayna N Dunn, PA  Oyster Shell (OYSTER CALCIUM) 500 MG TABS Take 500 mg of elemental calcium by mouth daily.   Yes Historical Provider, MD  valsartan (DIOVAN) 160 MG tablet Take 160 mg by mouth 2 (two) times daily.   Yes Historical Provider, MD  vitamin C (ASCORBIC ACID) 500 MG tablet Take 500 mg by mouth daily.   Yes Historical Provider, MD  vitamin E 400 UNIT capsule Take 400 Units by mouth daily.     Yes Historical Provider, MD  HYDROcodone-acetaminophen (VICODIN) 5-500 MG per tablet Take 1 tablet by mouth every 6 (six) hours as needed. For pain 05/11/12   [name], MD   Family History  Family History  Problem Relation Age of Onset  . Diabetes    . Cancer    . Lung cancer     Social History  History   Social History  . Marital Status: Widowed    Spouse Name: N/A    Number of Children: N/A  . Years of Education: N/A   Occupational History  . Not on file.   Social History Main Topics  . Smoking status: Former Smoker -- 1.5 packs/day for 30 years    Types: Cigarettes    Quit date: 09/01/1979  . Smokeless tobacco: Never Used  . Alcohol Use: Yes     06/06/12 "mixed drink once or twice a year.to celebrate"  . Drug Use: No  . Sexually Active: No   Other Topics Concern  . Not on file   Social History Narrative   Lives  in [place].    Review of Systems General:  No chills, fever, night sweats or weight changes.  Cardiovascular:  +++ chest pain, palpitations, and dyspnea on exertion as outlined above.  No, edema, orthopnea, paroxysmal nocturnal dyspnea. Dermatological: No rash, lesions/masses Respiratory: No cough.  DOE as above.  Urologic: No hematuria, dysuria Abdominal:   +++ nausea & vomiting x 24-48 hrs.  No diarrhea,  bright red blood per rectum, melena, or hematemesis Neurologic:  No visual changes, +++wkns, no changes in mental status. All other systems reviewed and are otherwise negative except as noted above.  Physical Exam  Blood pressure 144/108, pulse 142, temperature 97.9 F (36.6 C), temperature source Axillary, resp. rate 30, height 5\' 1"  (1.549 m), weight 132 lb 0.9 oz (59.9 kg), SpO2 100.00%.  General: Pleasant, NAD.  Uncomfortable with bipap mask. Psych: Flat affect. Neuro: Alert and oriented X 3. Moves all extremities spontaneously. HEENT: Normal  Neck: Supple without bruits.  JVP approx 12cm. Lungs:  Resp regular and unlabored, bibasilar crackles (approx 1/2 up on right). Heart: ir, ir no s3, s4, 2/6 sem rusb. Abdomen: Soft, non-tender, non-distended, BS + x 4.  Extremities: No clubbing, cyanosis or edema. DP/PT/Radials 1+ and equal bilaterally.  Labs  Basename 08/10/12 1606 08/10/12 1121  CKTOTAL 76 --  CKMB 3.9 --  TROPONINI <0.30 <0.30   Lab Results  Component Value Date   WBC 11.5* 08/10/2012   HGB 11.8* 08/10/2012   HCT 34.2* 08/10/2012   MCV 87.2 08/10/2012   PLT 285 08/10/2012     Lab 08/10/12 1107  NA 130*  K 4.0  CL 93*  CO2 25  BUN 26*  CREATININE 1.72*  CALCIUM 9.4  PROT --  BILITOT --  ALKPHOS --  ALT --  AST --  GLUCOSE 152*    Lab Results  Component Value Date   DDIMER 0.91* 08/10/2012    Radiology/Studies  Dg Chest Portable 1 View  08/10/2012  *RADIOLOGY REPORT*  Clinical Data: Shortness of breath, fatigue  PORTABLE CHEST - 1 VIEW   Comparison: 08/10/2012  Findings: Moderate to marked cardiomegaly noted with new interstitial Kerley B lines and trace pleural effusions.  No focal pulmonary opacity.  No pneumothorax.  No acute osseous finding. Aorta is ectatic and unfolded.   Diffusely coarsened interstitial markings compatible with previously reported chronic lung disease.  IMPRESSION: Interstitial Kerley B lines and trace pleural fluid may indicate pulmonary edema, superimposed on underlying chronic lung disease.   Original Report Authenticated By: Harrel Lemon, M.D.    ECG  Afib, 103, lbbb, lad  ASSESSMENT AND PLAN  1. Atrial fibrillation with rapid ventricular response: This is a new diagnosis. She's been experiencing palpitations at least since Tuesday or Wednesday of this week. Echo in July showed normal LV function with grade 1 diastolic dysfunction. Will add IV diltiazem given exact duration of A. fib is unknown. If we cannot achieve adequate rate control  We will have to consider TEE and cardioversion on Monday. Continue heparin infusion. Chads 2 = 3. Plan to initiate long-term anticoagulation once it is clear as to whether or not she will require additional invasive procedures while hospitalized.  2. Acute on chronic diastolic congestive heart failure: In the setting of #1. Initiate IV diltiazem for rate control. Continue IV diuresis as she continues to have excess volume on exam. Follow renal function closely with ongoing diuresis.  3. Unstable angina/coronary artery disease: Patient has had increasing frequency of nitrate responsive chest pain in the setting of palpitations and progressive dyspnea. So far cardiac markers are negative. ECG shows a left bundle branch block which she does have a history of in an intermittent fashion. Cycle cardiac markers continue home medications. Continue heparin and nitroglycerin IV. Of note, d-dimer is elevated at this point I suspect dyspnea is related to heart failure and not a PE.  If however dyspnea persist following diuresis we will  obtain a VQ scan, given baseline renal insufficiency. As above, she is already anticoagulated with heparin.  4. Stage III chronic kidney disease: Creatinine is within her baseline range. Follow closely in the setting of diuresis.  5. Acute respiratory failure  Signed, [name], NP 08/10/2012, 8:45 PM  Patient seen and examined independently. Gilford Raid, NP note reviewed carefully - agree with his assessment and plan. I have edited the note based on my findings.   78 y/o woman with CRI. Underwent DES to OM in 7/13 for NSTEMI now presents with AF with RVR c/b acute HF and respiratory failure requiring BIPAP. She is improving with IV diuresis. Cardiac markers normal at this point.  Given CHF and possible rate-related LV dysfunction, would prefer amiodarone over cardizem for rate control despite risk of cardioverting her. Would heparinize while in house but given given age and CRI she is at high risk for bleeding and would avoid triple therapy with asa, plavix and coumadin at this point. Will discuss further with Dr. Excell Seltzer regarding length of DAPT and possible timing of coumadin.   Continue to diurese overnight and check echo in am.   Truman Hayward 10:17 PM

## 2012-08-10 NOTE — ED Notes (Signed)
Walked into patients room. Pt was pale, diaphoretic, with increased SOB, using accessory muscles. Pt stated she [facility] to [facility]. EMD and hospitalist to bedside. HR decreased to 30 Pt moved to room 2. Pt stated she wished to be a DNR witnessed by Dr. Adriana Simas, Dr. Lendell Caprice, Garrison Columbus, RN, Santiago Bur, RN. Daughter at bedside.

## 2012-08-10 NOTE — Consult Note (Signed)
[facility] Medical Consultation  [name]:[id number] DOB: [birth year] DOA: 08/10/2012 PCP: [name], MD   Requesting physician: Adriana Simas Date of consultation: 08/10/12 Reason for consultation: new atrial fibrillation  Impression/Recommendations Chest pain concerning for acute coronary syndrome. Patient has clinically deteriorated. She had and drug-eluting stent placed. I recommend consulting cardiology and transferring patient to [facility]. I have ordered an aspirin, nitroglycerin, ABG, repeat CXR.  Dr. Adriana Simas has ordered heparin gtt, nitro gtt  Acute pulmonary edema  New atrial fibrillation  Acute respiratory failure: Patient has been placed on BIPAP.  She reports she does not want intubation.  CAD with NSTEMI and DES to OM 05/2012  CKD stage 3-4  HTN  Chief  Complaint: shortness of breath  HPI:  The patient is a 78 year old white femalemale who presented to the emergency room with about 3 days of worsening shortness of breath. In the emergency room, she was found to have new onset atrial fibrillation. Troponin was normal. Chest x-ray showed nothing concerning. I was called by Dr. Adriana Simas to admit for dyspnea and new atrial fibrillation. I asked Dr. Adriana Simas to add a d-dimer and a pro BNP. When I arrived, patient was pale, poorly responsive, diaphoretic and clammy. She had labored breathing. According to nursing staff she suddenly became hypoxic, bradycardic and complained of nausea and shortness of breath. Patient is unable to provide much history. She reports now that she has left-sided chest pain and abdominal pain. She is short of breath and nauseated.   Review of Systems:  Unable to obtain due to patient factors.   Past Medical History  Diagnosis Date  . Lower extremity edema     bilateral  . Chronic renal insufficiency   . Labile hypertension   . Anxiety   . Failure to thrive in childhood   . History of shingles July 2004    on the left side of her  thorax and postherpetic neuralgia  . History of psoriasis   . History of recurrent UTIs   . Major depressive disorder, single episode, severe 2003/04/13    "when my husband died"  . LBBB (left bundle branch block)     intermittent  . Valvular heart disease     Echo 05/2012 - thickened AV with mildly restricted motion, mild AI, mild MR, mod TR  . CAD (coronary artery disease)     a. NSTEMI 05/2012 s/p DES to OM 06/07/12 (residual severe LCx subbranch dz for med rx unless refractory angina recurs)   Past Surgical History  Procedure Date  . Salpingoophorectomy     "cyst removed"  . Appendectomy   . Breast cyst excision   . Abdominal hysterectomy   . Cataract extraction, bilateral ~ 04-12-09  . Cardiac surgery    Social History:  reports that she quit smoking about 32 years ago. Her smoking use included Cigarettes. She has a 45 pack-year smoking history. She has never used smokeless tobacco. She reports that she drinks alcohol. She reports that she does not use illicit drugs.  Allergies  Allergen Reactions  . Metoprolol Swelling  . Norvasc (Amlodipine Besylate) Swelling  . Sulfonamide Derivatives Hives, Itching and Swelling  . Statins     Myalgias - "I won't take them"   Family History  Problem Relation Age of Onset  . Diabetes    . Cancer    . Lung cancer      Prior to Admission medications   Medication Sig Start Date End Date Taking? Authorizing Provider  aspirin EC 81 MG tablet Take 1 tablet (81 mg total) by mouth daily. 06/08/12  Yes Dayna N Dunn, PA  atenolol (TENORMIN) 50 MG tablet Take 50 mg by mouth daily.  04/15/12  Yes Historical Provider, MD  b complex vitamins tablet Take 1 tablet by mouth daily.     Yes Historical Provider, MD  Cholecalciferol (VITAMIN D-3) 5000 UNITS TABS Take 1 tablet by mouth daily.   Yes Historical Provider, MD  Cinnamon 500 MG TABS Take 1 tablet by mouth 2 (two) times daily.    Yes Historical Provider, MD  clopidogrel (PLAVIX) 75 MG tablet Take 1 tablet  (75 mg total) by mouth daily with breakfast. 06/08/12 06/08/13 Yes Dayna N Dunn, PA  Cod Liver Oil CAPS Take 1 capsule by mouth daily.   Yes Historical Provider, MD  fish oil-omega-3 fatty acids 1000 MG capsule Take 1 g by mouth 2 (two) times daily.    Yes Historical Provider, MD  isosorbide mononitrate (IMDUR) 30 MG 24 hr tablet Take 1 tablet (30 mg total) by mouth daily. 06/08/12 06/08/13 Yes Dayna N Dunn, PA  Krill Oil 300 MG CAPS Take 1 capsule by mouth daily.    Yes Historical Provider, MD  L-Lysine 500 MG CAPS Take 1 capsule by mouth 2 (two) times daily.   Yes Historical Provider, MD  nisoldipine (SULAR) 17 MG 24 hr tablet TAKE 1 TABLET EVERY DAY 04/09/12  Yes [name], MD  nitroGLYCERIN (NITROSTAT) 0.4 MG SL tablet Place 1 tablet (0.4 mg total) under the tongue every 5 (five) minutes x 3 doses as needed for chest pain. 06/08/12 06/08/13 Yes Dayna N Dunn, PA  Oyster Shell (OYSTER CALCIUM) 500 MG TABS Take 500 mg of elemental calcium by mouth daily.   Yes Historical Provider, MD  valsartan (DIOVAN) 160 MG tablet Take 160 mg by mouth 2 (two) times daily.   Yes Historical Provider, MD  vitamin C (ASCORBIC ACID) 500 MG tablet Take 500 mg by mouth daily.   Yes Historical Provider, MD  vitamin E 400 UNIT capsule Take 400 Units by mouth daily.     Yes Historical Provider, MD  HYDROcodone-acetaminophen (VICODIN) 5-500 MG per tablet Take 1 tablet by mouth every 6 (six) hours as needed. For pain 05/11/12   [name], MD   Physical Exam: Blood pressure 148/100, pulse 125, temperature 97.7 F (36.5 C), temperature source Oral, resp. rate 26, height 5' 1.5" (1.562 m), weight 56.246 kg (124 lb), SpO2 99.00%. Filed Vitals:   08/10/12 1620 08/10/12 1640 08/10/12 1723 08/10/12 1737  BP:  150/110 149/103 148/100  Pulse: 130 117  125  Temp:      TempSrc:      Resp: 31  31 26   Height:      Weight:      SpO2: 99% 100% 100% 99%     General:  Pale, diaphoretic. Poorly responsive. Will answer  questions occasionally with one her word answers. Severe respiratory distress   Eyes: PERRL, IOMI  ENT: NRB in place  Neck: supple. JVD. No mass  Cardiovascular: irreg irreg, fast, no mgr noted  Respiratory: tachypneic. Accessory muscle use. Diminished on the right.  Abdomen: s, nt, nd  Skin: pale, diaphoretic, clammy  Musculoskeletal: no deformities  Neurologic: no focal deficits. Lethargic  Psychiatric: anxious  Labs on Admission:  Basic Metabolic Panel:  Lab 08/10/12 1610  NA 130*  K 4.0  CL 93*  CO2 25  GLUCOSE 152*  BUN 26*  CREATININE 1.72*  CALCIUM 9.4  MG --  PHOS --   Liver Function Tests: No results found for this basename: AST:5,ALT:5,ALKPHOS:5,BILITOT:5,PROT:5,ALBUMIN:5 in the last 168 hours No results found for this basename: LIPASE:5,AMYLASE:5 in the last 168 hours No results found for this basename: AMMONIA:5 in the last 168 hours CBC:  Lab 08/10/12 1107  WBC 11.5*  NEUTROABS 10.1*  HGB 11.8*  HCT 34.2*  MCV 87.2  PLT 285   Cardiac Enzymes:  Lab 08/10/12 1606 08/10/12 1121  CKTOTAL 76 --  CKMB 3.9 --  CKMBINDEX -- --  TROPONINI <0.30 <0.30   BNP: No components found with this basename: POCBNP:5 CBG: No results found for this basename: GLUCAP:5 in the last 168 hours  Radiological Exams on Admission: Dg Chest Portable 1 View  08/10/2012  *RADIOLOGY REPORT*  Clinical Data: Shortness of breath, fatigue  PORTABLE CHEST - 1 VIEW  Comparison: 08/10/2012  Findings: Moderate to marked cardiomegaly noted with new interstitial Kerley B lines and trace pleural effusions.  No focal pulmonary opacity.  No pneumothorax.  No acute osseous finding. Aorta is ectatic and unfolded.   Diffusely coarsened interstitial markings compatible with previously reported chronic lung disease.  IMPRESSION: Interstitial Kerley B lines and trace pleural fluid may indicate pulmonary edema, superimposed on underlying chronic lung disease.   Original Report Authenticated  By: Harrel Lemon, M.D.    Dg Chest Port 1 View  08/10/2012  *RADIOLOGY REPORT*  Clinical Data: Shortness of breath.  PORTABLE CHEST - 1 VIEW  Comparison: 06/06/2012 and 02/14/2012.  Findings: 1210 hours.  Cardiomegaly and aortic tortuosity appear stable.  There is stable biapical pleural parenchymal scarring.  No superimposed airspace disease, edema or pleural effusion is seen. Osseous structures appear unchanged.  Telemetry leads overlie the chest.  IMPRESSION: Stable cardiomegaly and chronic biapical lung disease.  No acute cardiopulmonary process.   Original Report Authenticated By: Gerrianne Scale, M.D.     EKG:  Atrial fib. Rate 110. Old LBBB  Critical care time 60 minutes  Alandra Sando L [facility] Pager [phone]  If 7PM-7AM, please contact night-coverage [email or web address] Password [facility] 08/10/2012, 6:02 PM

## 2012-08-10 NOTE — Progress Notes (Signed)
ANTICOAGULATION CONSULT NOTE - Initial Consult  Pharmacy Consult for Heparin Indication: chest pain/ACS / new onset A-Fib - RVR  Allergies  Allergen Reactions  . Metoprolol Swelling  . Norvasc (Amlodipine Besylate) Swelling  . Sulfonamide Derivatives Hives, Itching and Swelling  . Statins     Myalgias - "I won't take them"    Patient Measurements: Height: 5\' 1"  (154.9 cm) Weight: 132 lb 0.9 oz (59.9 kg) IBW/kg (Calculated) : 47.8    Vital Signs: Temp: 97.9 F (36.6 C) (09/13 1959) Temp src: Axillary (09/13 1959) BP: 144/108 mmHg (09/13 1913) Pulse Rate: 142  (09/13 1913)  Labs:  Basename 08/10/12 1606 08/10/12 1121 08/10/12 1107  HGB -- -- 11.8*  HCT -- -- 34.2*  PLT -- -- 285  APTT -- -- --  LABPROT -- -- --  INR -- -- --  HEPARINUNFRC -- -- --  CREATININE -- -- 1.72*  CKTOTAL 76 -- --  CKMB 3.9 -- --  TROPONINI <0.30 <0.30 --    Estimated Creatinine Clearance: 21.7 ml/min (by C-G formula based on Cr of 1.72).   Medical History: Past Medical History  Diagnosis Date  . Lower extremity edema     bilateral  . CKD (chronic kidney disease), stage III     a. baseline creat 1.6->2  . Labile hypertension   . Anxiety   . Failure to thrive in childhood   . History of shingles July 2004    on the left side of her thorax and postherpetic neuralgia  . History of psoriasis   . History of recurrent UTIs   . Major depressive disorder, single episode, severe 05/26/03    "when my husband died"  . Valvular heart disease     Echo 05/2012 - thickened AV with mildly restricted motion, mild AI, mild MR, mod TR  . CAD (coronary artery disease)     a. NSTEMI 05/2012 s/p DES to OM 06/07/12 (residual severe LCx subbranch dz for med rx unless refractory angina recurs)  . LBBB (left bundle branch block)     intermittent  . Chronic diastolic CHF (congestive heart failure)     a. 05/2012 Echo: EF 60-65%, Gr 1 DD, mild AI/MR, Mod TR    Assessment: 82yof with Hx of CAD/PCI presented  with increased SOB, and new onset A-Fib.  She does not take any anticoagulants at home, baseline CBc stable.  Heaprin bolus 3000 uts iv x1 and heparin drip 1000 uts/hr started at [facility] at 1630.  Will check HL and adjust as needed. Goal of Therapy:  Heparin level 0.3-0.7 units/ml Monitor platelets by anticoagulation protocol: Yes   Plan:  Continue Heparin drip 1000 uts/hr Check HL at 2300 about 6hr after heparin drip started  Adjust drip as needed  HL, CBC daily  Marcelino Scot 08/10/2012,9:47 PM

## 2012-08-10 NOTE — ED Notes (Signed)
Report attempted at [facility]. To call back in 5 min.

## 2012-08-10 NOTE — ED Notes (Signed)
Patient stated she felt "sweaty" and just did not feel right.  Patient's heart rate is 105 and her o2 sat is dropping.  Nurse was notified and Dr. Adriana Simas.

## 2012-08-11 DIAGNOSIS — I2510 Atherosclerotic heart disease of native coronary artery without angina pectoris: Secondary | ICD-10-CM

## 2012-08-11 DIAGNOSIS — N189 Chronic kidney disease, unspecified: Secondary | ICD-10-CM

## 2012-08-11 DIAGNOSIS — I369 Nonrheumatic tricuspid valve disorder, unspecified: Secondary | ICD-10-CM

## 2012-08-11 LAB — HEPARIN LEVEL (UNFRACTIONATED)
Heparin Unfractionated: 0.5 IU/mL (ref 0.30–0.70)
Heparin Unfractionated: 0.57 IU/mL (ref 0.30–0.70)

## 2012-08-11 LAB — BASIC METABOLIC PANEL
CO2: 28 mEq/L (ref 19–32)
Chloride: 96 mEq/L (ref 96–112)
Creatinine, Ser: 1.97 mg/dL — ABNORMAL HIGH (ref 0.50–1.10)
GFR calc Af Amer: 26 mL/min — ABNORMAL LOW (ref >90)
Potassium: 3.8 mEq/L (ref 3.5–5.1)

## 2012-08-11 LAB — CBC
MCV: 87.9 fL (ref 78.0–100.0)
Platelets: 262 10*3/uL (ref 150–400)
RBC: 3.79 MIL/uL — ABNORMAL LOW (ref 3.87–5.11)
WBC: 11.3 10*3/uL — ABNORMAL HIGH (ref 4.0–10.5)

## 2012-08-11 LAB — TROPONIN I: Troponin I: <0.3 ng/mL (ref <0.30)

## 2012-08-11 LAB — CK TOTAL AND CKMB (NOT AT ARMC)
CK, MB: 3.7 ng/mL (ref 0.3–4.0)
Total CK: 64 U/L (ref 7–177)

## 2012-08-11 MED ORDER — AMIODARONE HCL 200 MG PO TABS
400.0000 mg | ORAL_TABLET | Freq: Two times a day (BID) | ORAL | Status: DC
  Administered 2012-08-11 – 2012-08-13 (×5): 400 mg via ORAL
  Filled 2012-08-11 (×7): qty 2

## 2012-08-11 NOTE — Progress Notes (Signed)
ANTICOAGULATION CONSULT NOTE - Follow Up Consult  Pharmacy Consult for heparin Indication: chest pain/ACS and atrial fibrillation  Labs:  Basename 08/10/12 2315 08/10/12 2138 08/10/12 1606 08/10/12 1121 08/10/12 1107  HGB -- -- -- -- 11.8*  HCT -- -- -- -- 34.2*  PLT -- -- -- -- 285  APTT -- -- -- -- --  LABPROT -- -- -- -- --  INR -- -- -- -- --  HEPARINUNFRC 0.50 -- -- -- --  CREATININE -- -- -- -- 1.72*  CKTOTAL -- 68 76 -- --  CKMB -- 3.9 3.9 -- --  TROPONINI -- <0.30 <0.30 <0.30 --    Assessment/Plan:  78yo female therapeutic on heparin with initial dosing for CP with new-onset Afib.  Will continue gtt at current rate and confirm stable with am labs.  Colleen Can PharmD BCPS 08/11/2012,12:16 AM

## 2012-08-11 NOTE — Progress Notes (Signed)
ANTICOAGULATION CONSULT NOTE - Initial Consult  Pharmacy Consult for Heparin Indication: chest pain/ACS / new onset A-Fib - RVR  Allergies  Allergen Reactions  . Metoprolol Swelling  . Norvasc (Amlodipine Besylate) Swelling  . Sulfonamide Derivatives Hives, Itching and Swelling  . Statins     Myalgias - "I won't take them"    Patient Measurements: Height: 5\' 1"  (154.9 cm) Weight: 129 lb 3 oz (58.6 kg) IBW/kg (Calculated) : 47.8  Heparin dosing weight: 58.6 kg   Vital Signs: Temp: 97.8 F (36.6 C) (09/14 0400) Temp src: Oral (09/14 0730) BP: 140/68 mmHg (09/14 0700) Pulse Rate: 104  (09/14 0700)  Labs:  Basename 08/11/12 0520 08/11/12 0300 08/10/12 2315 08/10/12 2137-04-24 08/10/12 1606 08/10/12 1107  HGB 11.5* -- -- -- -- 11.8*  HCT 33.3* -- -- -- -- 34.2*  PLT 262 -- -- -- -- 285  APTT -- -- -- -- -- --  LABPROT -- -- -- -- -- --  INR -- -- -- -- -- --  HEPARINUNFRC 0.57 -- 0.50 -- -- --  CREATININE 1.97* -- -- -- -- 1.72*  CKTOTAL -- 64 -- 68 76 --  CKMB -- 3.7 -- 3.9 3.9 --  TROPONINI -- <0.30 -- <0.30 <0.30 --    Estimated Creatinine Clearance: 18.7 ml/min (by C-G formula based on Cr of 1.97).   Medical History: Past Medical History  Diagnosis Date  . Lower extremity edema     bilateral  . CKD (chronic kidney disease), stage III     a. baseline creat 1.6->2  . Labile hypertension   . Anxiety   . Failure to thrive in childhood   . History of shingles July 2004    on the left side of her thorax and postherpetic neuralgia  . History of psoriasis   . History of recurrent UTIs   . Major depressive disorder, single episode, severe 04-25-03    "when my husband died"  . Valvular heart disease     Echo 05/2012 - thickened AV with mildly restricted motion, mild AI, mild MR, mod TR  . CAD (coronary artery disease)     a. NSTEMI 05/2012 s/p DES to OM 06/07/12 (residual severe LCx subbranch dz for med rx unless refractory angina recurs)  . LBBB (left bundle branch block)      intermittent  . Chronic diastolic CHF (congestive heart failure)     a. 05/2012 Echo: EF 60-65%, Gr 1 DD, mild AI/MR, Mod TR    Assessment: [name] with Hx of CAD/PCI presented with CP, increased SOB, and new onset A-Fib.  She does not take any anticoagulants at home. Heparin level remains therapeutic and stable this morning, Hgb and Plt are stable, no bleeding noted per chart.   Goal of Therapy:  Heparin level 0.3-0.7 units/ml Monitor platelets by anticoagulation protocol: Yes   Plan:  Continue Heparin drip 1000 uts/hr F/u heparin level and cbc tomorrow morning.  Bayard Hugger, PharmD, BCPS  Clinical Pharmacist  Pager: [phone]  08/11/2012,7:53 AM

## 2012-08-11 NOTE — Evaluation (Signed)
Physical Therapy Evaluation Patient Details Name: [name] MRN: [id number] DOB: 19-Feb-1930 Today's Date: 08/11/2012 Time: 4098-1191 PT Time Calculation (min): 25 min  PT Assessment / Plan / Recommendation Clinical Impression  pt admitted from [facility] with afib and RVR plus CHF.  Resp failure resolved with diuresis, but high HR remains.  Gait is mildly  unsteady with compromised balance.  Expect steady recovery.  Will see on acute.    PT Assessment  Patient needs continued PT services    Follow Up Recommendations  No PT follow up;Other (comment) (vs HHPT)    Barriers to Discharge None      Equipment Recommendations  None recommended by PT    Recommendations for Other Services     Frequency Min 3X/week    Precautions / Restrictions Restrictions Weight Bearing Restrictions: No   Pertinent Vitals/Pain 97 %on RA with exertion, HR up to 127bpm      Mobility  Bed Mobility Bed Mobility: Supine to Sit;Sitting - Scoot to Edge of Bed Supine to Sit: 6: Modified independent (Device/Increase time) Sitting - Scoot to Edge of Bed: 6: Modified independent (Device/Increase time) Details for Bed Mobility Assistance: used safe technique Transfers Transfers: Sit to Stand;Stand to Sit Sit to Stand: 5: Supervision;From bed;From toilet;With upper extremity assist Stand to Sit: 5: Supervision;To toilet;To chair/3-in-1 Details for Transfer Assistance: mobilizes safely Ambulation/Gait Ambulation/Gait Assistance: 5: Supervision Ambulation Distance (Feet): 100 Feet Assistive device: None Ambulation/Gait Assistance Details: mildly unsteady and guarded without LOB, stepping strategy to maintain balance Gait Pattern: Step-through pattern;Decreased step length - right;Decreased stride length;Decreased step length - left (stepping strategy to maintain balance) Gait velocity: slower Stairs: No Wheelchair Mobility Wheelchair Mobility: No    Exercises     PT Diagnosis: Other (comment) (decr  activity tolerance)  PT Problem List: Decreased activity tolerance;Decreased balance;Decreased mobility;Cardiopulmonary status limiting activity PT Treatment Interventions: Gait training;Functional mobility training;Therapeutic activities;Balance training;Patient/family education   PT Goals Acute Rehab PT Goals PT Goal Formulation: With patient Time For Goal Achievement: 08/18/12 Potential to Achieve Goals: Good Pt will Transfer Bed to Chair/Chair to Bed: Independently PT Transfer Goal: Bed to Chair/Chair to Bed - Progress: Goal set today Pt will Ambulate: >150 feet;Independently PT Goal: Ambulate - Progress: Goal set today Pt will Go Up / Down Stairs: 3-5 stairs;Independently PT Goal: Up/Down Stairs - Progress: Goal set today  Visit Information  Last PT Received On: 08/11/12 Assistance Needed: +1    Subjective Data  Subjective: I do everything for myself Patient Stated Goal: Home independent   Prior Functioning  Home Living Lives With: Other (Comment) (great grandson lives with her) Available Help at Discharge: Family Type of Home: House Home Access: Stairs to enter Home Layout: Two level;Able to live on main level with bedroom/bathroom Alternate Level Stairs-Rails: Right;Left Bathroom Shower/Tub: Tub/shower unit;Curtain Bathroom Toilet: Standard Bathroom Accessibility: No Home Adaptive Equipment: Shower chair without back;Walker - rolling;Straight cane Prior Function Level of Independence: Independent Able to Take Stairs?: Yes Driving: Yes Communication Communication: No difficulties Dominant Hand: Right    Cognition  Overall Cognitive Status: Appears within functional limits for tasks assessed/performed Arousal/Alertness: Awake/alert Orientation Level: Oriented X4 / Intact Behavior During Session: [facility] for tasks performed    Extremity/Trunk Assessment Right Upper Extremity Assessment RUE ROM/Strength/Tone: Within functional levels Left Upper Extremity  Assessment LUE ROM/Strength/Tone: Within functional levels Right Lower Extremity Assessment RLE ROM/Strength/Tone: Within functional levels Left Lower Extremity Assessment LLE ROM/Strength/Tone: Within functional levels Trunk Assessment Trunk Assessment: Normal   Balance Balance Balance Assessed: No  End of Session PT - End of Session Activity Tolerance: Patient tolerated treatment well Patient left: in chair;with call bell/phone within reach;with family/visitor present Nurse Communication: Mobility status  GP     Lawsyn Heiler, Eliseo Gum 08/11/2012, 5:09 PM  08/11/2012  Arma Bing, PT [phone] (pager)

## 2012-08-11 NOTE — Progress Notes (Signed)
  Echocardiogram 2D Echocardiogram has been performed.  Cathie Beams 08/11/2012, 9:48 AM

## 2012-08-11 NOTE — Progress Notes (Signed)
V.O received from Dr Gala Romney  to tx Pt to [facility] dept .

## 2012-08-11 NOTE — Progress Notes (Addendum)
Subjective:   78 y/o female with h/o CAD s/p NSTEMI and PCI (DES to OM) in 05/2012 who transfered from [facility] on 9/13 with afib and chf.  Started on IV amio last night and diuresed. Now feeling much better. Off BIPAP. HRs 80-100. No CP. Dyspnea improved. CE negative. Cr up slightly 1.7->1.97. Weight down 3 pounds. Echo pending.    Intake/Output Summary (Last 24 hours) at 08/11/12 0844 Last data filed at 08/11/12 0700  Gross per 24 hour  Intake 756.98 ml  Output   1550 ml  Net -793.02 ml    Current meds:    . sodium chloride   Intravenous Once  . amiodarone  150 mg Intravenous Once  . aspirin      . aspirin EC  81 mg Oral Daily  . aspirin  324 mg Oral Once  . atenolol  25 mg Oral Daily  . clopidogrel  75 mg Oral Q breakfast  . furosemide  40 mg Intravenous Q12H  . furosemide  80 mg Intravenous Once  . furosemide  80 mg Intravenous Once  . heparin  3,000 Units Intravenous Once  . nitroGLYCERIN  3 mcg/min Intravenous Once  . sodium chloride  3 mL Intravenous Q12H  . DISCONTD: atenolol  50 mg Oral Daily  . DISCONTD: enoxaparin (LOVENOX) injection  30 mg Subcutaneous Q24H  . DISCONTD: furosemide  80 mg Intramuscular Once   Infusions:    . amiodarone (NEXTERONE PREMIX) 360 mg/200 mL dextrose 60 mg/hr (08/11/12 0014)   And  . amiodarone (NEXTERONE PREMIX) 360 mg/200 mL dextrose 30 mg/hr (08/11/12 0835)  . heparin 1,000 Units/hr (08/10/12 2300)  . DISCONTD: diltiazem (CARDIZEM) infusion    . DISCONTD: nitroGLYCERIN       Objective:  Blood pressure 140/68, pulse 104, temperature 97.8 F (36.6 C), temperature source Oral, resp. rate 22, height 5\' 1"  (1.549 m), weight 58.6 kg (129 lb 3 oz), SpO2 99.00%. Weight change:   Physical Exam: General:  Elderly. Frail. No resp difficulty HEENT: normal Neck: supple. JVP 7 . Carotids 2+ bilat; no bruits. No lymphadenopathy or thryomegaly appreciated. Cor: PMI nondisplaced. Irregular rate & rhythm. No rubs, gallops 2/6 SEM  RUSB Lungs: mild rhonchi  Abdomen: soft, nontender, nondistended. No hepatosplenomegaly. No bruits or masses. Good bowel sounds. Extremities: no cyanosis, clubbing, rash, edema Neuro: alert & orientedx3, cranial nerves grossly intact. moves all 4 extremities w/o difficulty. Affect pleasant  Telemetry:  AF with bundle 80-100  Lab Results: Basic Metabolic Panel:  Lab 08/11/12 4696 08/10/12 1107  NA 137 130*  K 3.8 4.0  CL 96 93*  CO2 28 25  GLUCOSE 98 152*  BUN 31* 26*  CREATININE 1.97* 1.72*  CALCIUM 9.1 9.4  MG -- --  PHOS -- --   Liver Function Tests: No results found for this basename: AST:5,ALT:5,ALKPHOS:5,BILITOT:5,PROT:5,ALBUMIN:5 in the last 168 hours No results found for this basename: LIPASE:5,AMYLASE:5 in the last 168 hours No results found for this basename: AMMONIA:5 in the last 168 hours CBC:  Lab 08/11/12 0520 08/10/12 1107  WBC 11.3* 11.5*  NEUTROABS -- 10.1*  HGB 11.5* 11.8*  HCT 33.3* 34.2*  MCV 87.9 87.2  PLT 262 285   Cardiac Enzymes:  Lab 08/11/12 0300 08/10/12 2138 08/10/12 1606 08/10/12 1121  CKTOTAL 64 68 76 --  CKMB 3.7 3.9 3.9 --  CKMBINDEX -- -- -- --  TROPONINI <0.30 <0.30 <0.30 <0.30   BNP: No components found with this basename: POCBNP:5 CBG: No results found for this basename:  GLUCAP:5 in the last 168 hours Microbiology: No results found for this basename: cult   No results found for this basename: CULT:2,SDES:2 in the last 168 hours  Imaging: Dg Chest Portable 1 View  08/10/2012  *RADIOLOGY REPORT*  Clinical Data: Shortness of breath, fatigue  PORTABLE CHEST - 1 VIEW  Comparison: 08/10/2012  Findings: Moderate to marked cardiomegaly noted with new interstitial Kerley B lines and trace pleural effusions.  No focal pulmonary opacity.  No pneumothorax.  No acute osseous finding. Aorta is ectatic and unfolded.   Diffusely coarsened interstitial markings compatible with previously reported chronic lung disease.  IMPRESSION: Interstitial  Kerley B lines and trace pleural fluid may indicate pulmonary edema, superimposed on underlying chronic lung disease.   Original Report Authenticated By: Harrel Lemon, M.D.    Dg Chest Port 1 View  08/10/2012  *RADIOLOGY REPORT*  Clinical Data: Shortness of breath.  PORTABLE CHEST - 1 VIEW  Comparison: 06/06/2012 and 02/14/2012.  Findings: 1210 hours.  Cardiomegaly and aortic tortuosity appear stable.  There is stable biapical pleural parenchymal scarring.  No superimposed airspace disease, edema or pleural effusion is seen. Osseous structures appear unchanged.  Telemetry leads overlie the chest.  IMPRESSION: Stable cardiomegaly and chronic biapical lung disease.  No acute cardiopulmonary process.   Original Report Authenticated By: Gerrianne Scale, M.D.      ASSESSMENT:  1. Acute respiratory failure - resolved 2. A/C diastolic HF in setting of AF with RVR 3. A/c renal failure - baselien ~1.7 4. CAD -s/p NSTEM/ DES to OM in 7/13 5. AF with RVR - now on Amio 6. LBBB 7. COPD  PLAN/DISCUSSION:  Much improved. AF rate controlled. Volume status improved. Off Bipap. Renal function just slightly worse with diuresis.   Will transfer to stepdown. Change amio to po. Stop lasix (not on any diuretic at home - can use prn). Await echo. Will continue heparin while in house but have decided not to load coumadin given need for DAPT with recent DES. Will discuss with Dr. Excell Seltzer.   PT to see. Mobilize. Can d/c Foley.   LOS: 1 day    Arvilla Meres, MD 08/11/2012, 8:44 AM

## 2012-08-11 NOTE — Progress Notes (Signed)
Nutrition Brief Note  Patient identified on the Malnutrition Screening Tool (MST) report for wt loss, generating a score of 2.   Body mass index is 24.41 kg/(m^2). Pt meets criteria for WNL based on current BMI.   Pt states she was intentionally trying to lose wt so she could remain in current clothes.  Pt denies nutrition needs.  Assessed diet and intake.  Pt with few gaps; eats a moderately balanced diet with variable intake.  When asked if she was willing to make changes to her diet, pt states "not really, I think I have a good handle on things."  Current diet order is heart healthy, patient is consuming approximately 60% of meals at this time. Labs and medications reviewed.   No nutrition interventions warranted at this time. If nutrition issues arise, please consult RD.   Loyce Dys, MS RD LDN Clinical Inpatient Dietitian Pager: [phone] Weekend/After hours pager: [phone]

## 2012-08-12 LAB — CBC
MCH: 29.8 pg (ref 26.0–34.0)
Platelets: 263 10*3/uL (ref 150–400)
RBC: 3.92 MIL/uL (ref 3.87–5.11)

## 2012-08-12 LAB — BASIC METABOLIC PANEL
Calcium: 8.7 mg/dL (ref 8.4–10.5)
GFR calc non Af Amer: 23 mL/min — ABNORMAL LOW (ref >90)
Glucose, Bld: 96 mg/dL (ref 70–99)
Sodium: 135 mEq/L (ref 135–145)

## 2012-08-12 LAB — HEPARIN LEVEL (UNFRACTIONATED)
Heparin Unfractionated: 0.28 IU/mL — ABNORMAL LOW (ref 0.30–0.70)
Heparin Unfractionated: 0.22 IU/mL — ABNORMAL LOW (ref 0.30–0.70)

## 2012-08-12 MED ORDER — BISOPROLOL FUMARATE 5 MG PO TABS
5.0000 mg | ORAL_TABLET | Freq: Every day | ORAL | Status: DC
  Administered 2012-08-12: 5 mg via ORAL
  Filled 2012-08-12 (×2): qty 1

## 2012-08-12 MED ORDER — HEPARIN (PORCINE) IN NACL 100-0.45 UNIT/ML-% IJ SOLN
1200.0000 [IU]/h | INTRAMUSCULAR | Status: DC
  Administered 2012-08-12: 1200 [IU]/h via INTRAVENOUS
  Filled 2012-08-12 (×2): qty 250

## 2012-08-12 MED ORDER — ASPIRIN EC 325 MG PO TBEC
325.0000 mg | DELAYED_RELEASE_TABLET | Freq: Every day | ORAL | Status: DC
  Administered 2012-08-13: 325 mg via ORAL
  Filled 2012-08-12: qty 1

## 2012-08-12 NOTE — Progress Notes (Addendum)
Patient has complained of ringing in left ear.  Patient able to hear whispers.  On examination with otoscope by Conception Oms, RN, patient has large amount of cerumen in left ear.  RN stated will report to MD.  RN aware of situation.  MR - SN

## 2012-08-12 NOTE — Progress Notes (Signed)
Pharmacist Heart Failure Core Measure Documentation  Assessment: [name] has an EF documented as 35% on 08/11/12 by ECHO  Rationale: Heart failure patients with left ventricular systolic dysfunction (LVSD) and an EF < 40% should be prescribed an angiotensin converting enzyme inhibitor (ACEI) or angiotensin receptor blocker (ARB) at discharge unless a contraindication is documented in the medical record.  This patient is not currently on an ACEI or ARB for HF.  This note is being placed in the record in order to provide documentation that a contraindication to the use of these agents is present for this encounter.  ACE Inhibitor or Angiotensin Receptor Blocker is contraindicated (specify all that apply)  []   ACEI allergy AND ARB allergy []   Angioedema []   Moderate or severe aortic stenosis []   Hyperkalemia []   Hypotension []   Renal artery stenosis [x]   Worsening renal function, preexisting renal disease or dysfunction  Abran Duke, PharmD Clinical Pharmacist Phone: [phone] Pager: [phone] 08/12/2012 3:53 PM

## 2012-08-12 NOTE — Progress Notes (Signed)
Subjective:   78 y/o female with h/o CAD s/p NSTEMI and PCI (DES to OM) in 05/2012 who transfered from [facility] on 9/13 with afib and chf.  Started on amio and diuresed. Now feeling much better. Ambulating room independently. No CP/SOB.  HR 80-90s at rest. But up to 130 when ambulating with PT. EF ~35% (likely rate related - was previously normal)   No intake or output data in the 24 hours ending 08/12/12 1053  Current meds:    . amiodarone  400 mg Oral BID  . aspirin EC  81 mg Oral Daily  . atenolol  25 mg Oral Daily  . clopidogrel  75 mg Oral Q breakfast  . sodium chloride  3 mL Intravenous Q12H   Infusions:    . heparin 1,050 Units/hr (08/12/12 0941)     Objective:  Blood pressure 112/70, pulse 70, temperature 98.1 F (36.7 C), temperature source Oral, resp. rate 16, height 5\' 1"  (1.549 m), weight 58.6 kg (129 lb 3 oz), SpO2 100.00%. Weight change:   Physical Exam: General:  Elderly. Frail. No resp difficulty HEENT: normal Neck: supple. JVP 7 . Carotids 2+ bilat; no bruits. No lymphadenopathy or thryomegaly appreciated. Cor: PMI nondisplaced. Irregular rate & rhythm. No rubs, gallops 2/6 SEM RUSB Lungs: mild rhonchi  Abdomen: soft, nontender, nondistended. No hepatosplenomegaly. No bruits or masses. Good bowel sounds. Extremities: no cyanosis, clubbing, rash, edema Neuro: alert & orientedx3, cranial nerves grossly intact. moves all 4 extremities w/o difficulty. Affect pleasant  Telemetry:  AF with bundle 80-90s  Lab Results: Basic Metabolic Panel:  Lab 08/12/12 0981 08/11/12 0520 08/10/12 1107  NA 135 137 130*  K 3.7 3.8 --  CL 96 96 93*  CO2 27 28 25   GLUCOSE 96 98 152*  BUN 37* 31* 26*  CREATININE 1.96* 1.97* 1.72*  CALCIUM 8.7 9.1 9.4  MG -- -- --  PHOS -- -- --   Liver Function Tests: No results found for this basename: AST:5,ALT:5,ALKPHOS:5,BILITOT:5,PROT:5,ALBUMIN:5 in the last 168 hours No results found for this basename: LIPASE:5,AMYLASE:5 in the  last 168 hours No results found for this basename: AMMONIA:5 in the last 168 hours CBC:  Lab 08/12/12 0640 08/11/12 0520 08/10/12 1107  WBC 10.4 11.3* 11.5*  NEUTROABS -- -- 10.1*  HGB 11.7* 11.5* 11.8*  HCT 34.5* 33.3* 34.2*  MCV 88.0 87.9 87.2  PLT 263 262 285   Cardiac Enzymes:  Lab 08/11/12 0848 08/11/12 0300 08/10/12 2138 08/10/12 1606 08/10/12 1121  CKTOTAL -- 64 68 76 --  CKMB -- 3.7 3.9 3.9 --  CKMBINDEX -- -- -- -- --  TROPONINI <0.30 <0.30 <0.30 <0.30 <0.30   BNP: No components found with this basename: POCBNP:5 CBG: No results found for this basename: GLUCAP:5 in the last 168 hours Microbiology: No results found for this basename: cult   No results found for this basename: CULT:2,SDES:2 in the last 168 hours  Imaging: Dg Chest Portable 1 View  08/10/2012  *RADIOLOGY REPORT*  Clinical Data: Shortness of breath, fatigue  PORTABLE CHEST - 1 VIEW  Comparison: 08/10/2012  Findings: Moderate to marked cardiomegaly noted with new interstitial Kerley B lines and trace pleural effusions.  No focal pulmonary opacity.  No pneumothorax.  No acute osseous finding. Aorta is ectatic and unfolded.   Diffusely coarsened interstitial markings compatible with previously reported chronic lung disease.  IMPRESSION: Interstitial Kerley B lines and trace pleural fluid may indicate pulmonary edema, superimposed on underlying chronic lung disease.   Original Report Authenticated  By: Harrel Lemon, M.D.    Dg Chest Port 1 View  08/10/2012  *RADIOLOGY REPORT*  Clinical Data: Shortness of breath.  PORTABLE CHEST - 1 VIEW  Comparison: 06/06/2012 and 02/14/2012.  Findings: 1210 hours.  Cardiomegaly and aortic tortuosity appear stable.  There is stable biapical pleural parenchymal scarring.  No superimposed airspace disease, edema or pleural effusion is seen. Osseous structures appear unchanged.  Telemetry leads overlie the chest.  IMPRESSION: Stable cardiomegaly and chronic biapical lung disease.   No acute cardiopulmonary process.   Original Report Authenticated By: Gerrianne Scale, M.D.      ASSESSMENT:  1. Acute respiratory failure - resolved 2. A/C diastolic HF in setting of AF with RVR 3. A/c renal failure - baselien ~1.7 4. CAD -s/p NSTEM/ DES to OM in 7/13 5. AF with RVR - now on Amio 6. LBBB 7. COPD  PLAN/DISCUSSION:  Overall much, much improved. AF rate controlled at rest but jumps up mildly with exertion.  Volume status looks great.  Will continue po amio. Given LV dysfunction (suspect this is acute due to AF with RVR) will change atenolol to bisprolol (had swelling with metoprolol). No ace-i given CRI. Currently not requiring any diuretic (not on any diuretic at home - can use prn).   Will continue heparin while in house but have decided not to load coumadin given need for DAPT with recent DES. Will discuss with Dr. Excell Seltzer. Perhaps we can stop DAPT at 6 months as I think we should load with coumadin and attempt DC-CV at some point if doesn't convert with amio.  Continue with PT. Hopefully home tomorrow.    LOS: 2 days    Arvilla Meres, MD 08/12/2012, 10:53 AM

## 2012-08-12 NOTE — Progress Notes (Signed)
ANTICOAGULATION CONSULT NOTE - Follow Up Consult  Pharmacy Consult for heparin Indication: chest pain/ACS and atrial fibrillation  Allergies  Allergen Reactions  . Metoprolol Swelling  . Norvasc (Amlodipine Besylate) Swelling  . Sulfonamide Derivatives Hives, Itching and Swelling  . Statins     Myalgias - "I won't take them"    Patient Measurements: Height: 5\' 1"  (154.9 cm) Weight: 129 lb 3 oz (58.6 kg) IBW/kg (Calculated) : 47.8  Heparin Dosing Weight: 58.6kg   Vital Signs: Temp: 97.9 F (36.6 C) (09/15 1520) Temp src: Oral (09/15 1520) BP: 134/64 mmHg (09/15 1520) Pulse Rate: 82  (09/15 1520)  Labs:  Basename 08/12/12 1559 08/12/12 0640 08/11/12 0848 08/11/12 0520 08/11/12 0300 08/10/12 2138 08/10/12 1606 08/10/12 1107  HGB -- 11.7* -- 11.5* -- -- -- --  HCT -- 34.5* -- 33.3* -- -- -- 34.2*  PLT -- 263 -- 262 -- -- -- 285  APTT -- -- -- -- -- -- -- --  LABPROT -- -- -- -- -- -- -- --  INR -- -- -- -- -- -- -- --  HEPARINUNFRC 0.22* 0.28* -- 0.57 -- -- -- --  CREATININE -- 1.96* -- 1.97* -- -- -- 1.72*  CKTOTAL -- -- -- -- 64 68 76 --  CKMB -- -- -- -- 3.7 3.9 3.9 --  TROPONINI -- -- <0.30 -- <0.30 <0.30 -- --    Estimated Creatinine Clearance: 18.8 ml/min (by C-G formula based on Cr of 1.96).   Medications:  Scheduled:     . amiodarone  400 mg Oral BID  . aspirin EC  325 mg Oral Daily  . bisoprolol  5 mg Oral Daily  . clopidogrel  75 mg Oral Q breakfast  . sodium chloride  3 mL Intravenous Q12H  . DISCONTD: aspirin EC  81 mg Oral Daily  . DISCONTD: atenolol  25 mg Oral Daily    Assessment: 25 YOF with Hx of CAD/PCI who presented on 9/13 with SOB and found to have new-onset AFib. She was not on any anticoagulants PTA. Patient is currently on dual antiplatelet therapy due to recent drug eluding stent placement and is thought to be a poor triple therapy candidate (per Dr. Prescott Gum note). Heparin subtherapeutic again this PM. Will adjust rate.   Goal of  Therapy:  Heparin level 0.3-0.7 units/ml Monitor platelets by anticoagulation protocol: Yes   Plan:  1. Increase heparin to 1200 units/hr 2. Check 6hr heparin level

## 2012-08-12 NOTE — Progress Notes (Signed)
PT noted to have an impacted cerumen on the left ear post c/o ringing and roaring. Will refer to MD accordingly. Ancil Linsey RN

## 2012-08-12 NOTE — Progress Notes (Signed)
ANTICOAGULATION CONSULT NOTE - Follow Up Consult  Pharmacy Consult for heparin Indication: chest pain/ACS and atrial fibrillation  Allergies  Allergen Reactions  . Metoprolol Swelling  . Norvasc (Amlodipine Besylate) Swelling  . Sulfonamide Derivatives Hives, Itching and Swelling  . Statins     Myalgias - "I won't take them"    Patient Measurements: Height: 5\' 1"  (154.9 cm) Weight: 129 lb 3 oz (58.6 kg) IBW/kg (Calculated) : 47.8  Heparin Dosing Weight: 58.6kg   Vital Signs: Temp: 98.1 F (36.7 C) (09/15 0600) Temp src: Oral (09/14 2105) BP: 112/73 mmHg (09/15 0600) Pulse Rate: 53  (09/15 0600)  Labs:  Alvira Philips 08/12/12 0640 08/11/12 0848 08/11/12 0520 08/11/12 0300 08/10/12 2315 08/10/12 2138 08/10/12 1606 08/10/12 1107  HGB 11.7* -- 11.5* -- -- -- -- --  HCT 34.5* -- 33.3* -- -- -- -- 34.2*  PLT 263 -- 262 -- -- -- -- 285  APTT -- -- -- -- -- -- -- --  LABPROT -- -- -- -- -- -- -- --  INR -- -- -- -- -- -- -- --  HEPARINUNFRC 0.28* -- 0.57 -- 0.50 -- -- --  CREATININE 1.96* -- 1.97* -- -- -- -- 1.72*  CKTOTAL -- -- -- 64 -- 68 76 --  CKMB -- -- -- 3.7 -- 3.9 3.9 --  TROPONINI -- <0.30 -- <0.30 -- <0.30 -- --    Estimated Creatinine Clearance: 18.8 ml/min (by C-G formula based on Cr of 1.96).   Medications:  Scheduled:    . amiodarone  400 mg Oral BID  . aspirin EC  81 mg Oral Daily  . atenolol  25 mg Oral Daily  . clopidogrel  75 mg Oral Q breakfast  . sodium chloride  3 mL Intravenous Q12H    Assessment: 75 YOF with Hx of CAD/PCI who presented on 9/13 with SOB and found to have new-onset AFib. She was not on any anticoagulants PTA. Patient is currently on dual antiplatelet therapy due to recent drug eluding stent placement and is thought to be a poor triple therapy candidate (per Dr. Prescott Gum note). Heparin levels were therapeutic yesterday, but today she was slightly below goal at 0.28. Spoke with nurse and he said there were no issues with the line and  the infusion had not been stopped. No bleeding noted either. H/H and platelets stable.  Goal of Therapy:  Heparin level 0.3-0.7 units/ml Monitor platelets by anticoagulation protocol: Yes   Plan:  1. Increase heparin infusion to 1050units/hour to bring back to therapeutic goal 2. Heparin level in 6 hours to confirm dosing increase 3. Daily heparin level and CBC 4. Will follow long-term anticoagulation/antiplatelet plans  Zorianna Taliaferro D. Mikaya Bunner, PharmD Clinical Pharmacist Pager: [phone] Phone: [phone] 08/12/2012 9:13 AM

## 2012-08-13 ENCOUNTER — Encounter (HOSPITAL_COMMUNITY): Payer: Self-pay | Admitting: Nurse Practitioner

## 2012-08-13 DIAGNOSIS — N184 Chronic kidney disease, stage 4 (severe): Secondary | ICD-10-CM

## 2012-08-13 DIAGNOSIS — I5021 Acute systolic (congestive) heart failure: Secondary | ICD-10-CM

## 2012-08-13 LAB — URINE CULTURE

## 2012-08-13 LAB — HEPARIN LEVEL (UNFRACTIONATED): Heparin Unfractionated: 0.42 IU/mL (ref 0.30–0.70)

## 2012-08-13 LAB — BASIC METABOLIC PANEL
Calcium: 8.8 mg/dL (ref 8.4–10.5)
Chloride: 94 mEq/L — ABNORMAL LOW (ref 96–112)
Creatinine, Ser: 1.97 mg/dL — ABNORMAL HIGH (ref 0.50–1.10)
GFR calc Af Amer: 26 mL/min — ABNORMAL LOW (ref >90)

## 2012-08-13 LAB — CBC
HCT: 32.4 % — ABNORMAL LOW (ref 36.0–46.0)
Platelets: 246 10*3/uL (ref 150–400)
RDW: 12.6 % (ref 11.5–15.5)
WBC: 9.4 10*3/uL (ref 4.0–10.5)

## 2012-08-13 MED ORDER — POTASSIUM CHLORIDE CRYS ER 20 MEQ PO TBCR
40.0000 meq | EXTENDED_RELEASE_TABLET | Freq: Once | ORAL | Status: AC
  Administered 2012-08-13: 40 meq via ORAL
  Filled 2012-08-13: qty 2

## 2012-08-13 MED ORDER — POTASSIUM CHLORIDE ER 10 MEQ PO TBCR: 20.0000 meq | EXTENDED_RELEASE_TABLET | Freq: Every day | ORAL | Status: DC

## 2012-08-13 MED ORDER — FUROSEMIDE 40 MG PO TABS: 40.0000 mg | ORAL_TABLET | Freq: Every day | ORAL | Status: DC

## 2012-08-13 MED ORDER — AMIODARONE HCL 200 MG PO TABS: 200.0000 mg | ORAL_TABLET | Freq: Every day | ORAL | Status: DC

## 2012-08-13 MED ORDER — BISOPROLOL FUMARATE 10 MG PO TABS
10.0000 mg | ORAL_TABLET | Freq: Every day | ORAL | Status: DC
  Administered 2012-08-13: 10 mg via ORAL
  Filled 2012-08-13: qty 1

## 2012-08-13 MED ORDER — BISOPROLOL FUMARATE 10 MG PO TABS: 10.0000 mg | ORAL_TABLET | Freq: Every day | ORAL | Status: DC

## 2012-08-13 MED ORDER — AMIODARONE HCL 400 MG PO TABS: 400.0000 mg | ORAL_TABLET | Freq: Two times a day (BID) | ORAL | Status: DC

## 2012-08-13 NOTE — Discharge Summary (Signed)
Patient ID: [name],  MRN: [id number], DOB/AGE: [birth year] 78 y.o.  Admit date: 08/10/2012 Discharge date: 08/13/2012  Primary Care Provider: [name] Primary Cardiologist: Judie Petit. Excell Seltzer, MD  Discharge Diagnoses Principal Problem:  *Acute on chronic combined systolic and diastolic CHF, NYHA class 3  **In setting of rapid A. Fib with Net Negative of 1,082 mL this admission and reduction in weight from 132 lbs to 129 lbs.   Active Problems:  Acute respiratory failure  **Requiring Bi-Pap on admission.  Atrial fibrillation  CKD (chronic kidney disease), stage IV  **ARB therapy d/c'd during this admission secondary to slight rise in creatinine with diuresis.  HTN  Hyperlipidemia  [place]/Coronary atherosclerosis of native coronary artery  **s/p NSTEMI with DES to OM 05/2012.  Negative CE this admission.  Mild Aortic Stenosis  Allergies Allergies  Allergen Reactions  . Metoprolol Swelling  . Norvasc (Amlodipine Besylate) Swelling  . Sulfonamide Derivatives Hives, Itching and Swelling  . Statins     Myalgias - "I won't take them"   Procedures  2D Echocardiogram 08/11/2012  Study Conclusions  - Procedure narrative: Transthoracic echocardiography. Image   quality was adequate. The study was technically difficult. - Left ventricle: The cavity size was normal. The estimated   ejection fraction was 35%. - Aortic valve: There was mild stenosis. Mild regurgitation.   Valve area: 1.16cm^2(VTI). Valve area: 1.19cm^2 (Vmax).   Valve area: 1.24cm^2 (Vmean). - Mitral valve: Mild regurgitation. - Left atrium: The atrium was moderately dilated. - Right atrium: The atrium was mildly dilated. - Tricuspid valve: Moderate regurgitation. - Pulmonary arteries: PA peak pressure: 32mm Hg (S). _____________  History of Present Illness  78 year old female with recent history of non-ST segment elevation myocardial infarction in July of this year. At that time she underwent successful  PCI and drug-eluting stent placement to the obtuse marginal. She was noted to have severe residual disease in a sub-branch of the left circumflex and this is been medically managed.  Unfortunately, over a 1 week period prior to admission, she began to note increasing DOE, fatigue, tachypalpitations, and intermittent nitrate-responsive chest pain.  On the morning prior to admission, she also noted nausea, which was followed by vomiting on the day of admission.  In the setting of this constellation of symptoms, she has become progressively more weak and her family decided to take her to the ER for evaluation. At [facility], she was found to be in A. fib with RVR with a left bundle branch block. Her chest x-ray showed pulmonary edema and she was treated with aspirin, heparin, Lasix, and nitroglycerin. She was also placed on BiPAP. She was transferred to [facility] for further evaluation.  Hospital Course  Following arrival to [facility], pt remained on bipap while monitored in the CCU.  She continued to exhibit volume overload and was placed on IV lasix.  Amiodarone was initiated secondary to rapid afib, as was heparin.  With a CHADS2 score of 3, it is felt that at some point, she will require long-term anticoagulation however as she is s/p recent DES with need for dual anti-platelet therapy, we have not initiated coumadin at this time.  With diuresis and rate control (achieved via amiodarone and titration of beta-blocker therapy), pt has shown clinical improvement.  Bi-Pap was weaned off by 9/14.  Echocardiography performed on 9/14 showed newly reduced EF of 35%, in the setting of atrial fibrillation.  Though it was felt that reduced EF may be tachycardia-mediated, we did switch her off of  atenolol, which she had been on long-term @ home, and placed her on bisoprolol instead.  She had been on ARB therapy at home, however in the setting of diuretic initiation and slight bump in creatinine, this is currently on  hold.  We will consider re-initiating ARB therapy if renal function remains stable as an outpatient.  Pt has been ambulating this AM and feels that she is ready for discharge.  We will d/c her home today on amiodarone 400mg  BID x 1 week and then her dose will drop to 200mg  daily.  Once she is no longer requiring DAPT, provided that she remains in afib, we will consider TEE and DCCV.  As amiodarone is a new medicine for her, she will require outpatient monitoring of LFT's, PFT's and TFT's.  Discharge Vitals Blood pressure 118/62, pulse 73, temperature 97.7 F (36.5 C), temperature source Oral, resp. rate 18, height 5\' 1"  (1.549 m), weight 129 lb 3 oz (58.6 kg), SpO2 99.00%.  Filed Weights   08/10/12 1039 08/10/12 1959 08/11/12 0500  Weight: 124 lb (56.246 kg) 132 lb 0.9 oz (59.9 kg) 129 lb 3 oz (58.6 kg)   Labs  CBC  Basename 08/13/12 0530 08/12/12 0640  WBC 9.4 10.4  NEUTROABS -- --  HGB 11.1* 11.7*  HCT 32.4* 34.5*  MCV 87.8 88.0  PLT 246 263   Basic Metabolic Panel  Basename 08/13/12 0530 08/12/12 0640  NA 131* 135  K 3.0* 3.7  CL 94* 96  CO2 23 27  GLUCOSE 95 96  BUN 34* 37*  CREATININE 1.97* 1.96*  CALCIUM 8.8 8.7  MG -- --  PHOS -- --   Cardiac Enzymes  Basename 08/11/12 0848 08/11/12 0300 08/10/12 2138 08/10/12 1606  CKTOTAL -- 64 68 76  CKMB -- 3.7 3.9 3.9  CKMBINDEX -- -- -- --  TROPONINI <0.30 <0.30 <0.30 --   Thyroid Function Tests  Basename 08/10/12 2315  TSH 0.892  T4TOTAL --  T3FREE --  THYROIDAB --   Disposition  Pt is being discharged home today in good condition.  Follow-up Plans & Appointments  Follow-up Information    Follow up with Tereso Newcomer, PA. On 08/17/2012. (9:10 AM - Dr. Earmon Phoenix PA.  We will also check a blood chemistry that day.)    Contact information:   [address] [place] [address] [phone]       Follow up with [name], MD. (as scheduled)    Contact information:   [address] [place] [address] [phone]        Discharge Medications    Medication List     As of 08/13/2012  2:31 PM    STOP taking these medications         atenolol 50 MG tablet   Commonly known as: TENORMIN      valsartan 160 MG tablet   Commonly known as: DIOVAN      TAKE these medications         amiodarone 400 MG tablet   Commonly known as: PACERONE   Take 1 tablet (400 mg total) by mouth 2 (two) times daily  X 1 week then->      amiodarone 200 MG tablet   Commonly known as: PACERONE   Take 1 tablet (200 mg total) by mouth daily (after completion of 400mg  BID load).      aspirin EC 81 MG tablet   Take 1 tablet (81 mg total) by mouth daily.  b complex vitamins tablet   Take 1 tablet by mouth daily.      bisoprolol 10 MG tablet   Commonly known as: ZEBETA   Take 1 tablet (10 mg total) by mouth daily.      Cinnamon 500 MG Tabs   Take 1 tablet by mouth 2 (two) times daily.      clopidogrel 75 MG tablet   Commonly known as: PLAVIX   Take 1 tablet (75 mg total) by mouth daily with breakfast.      Cod Liver Oil Caps   Take 1 capsule by mouth daily.      fish oil-omega-3 fatty acids 1000 MG capsule   Take 1 g by mouth 2 (two) times daily.      furosemide 40 MG tablet   Commonly known as: LASIX   Take 1 tablet (40 mg total) by mouth daily.      HYDROcodone-acetaminophen 5-500 MG per tablet   Commonly known as: VICODIN   Take 1 tablet by mouth every 6 (six) hours as needed. For pain      isosorbide mononitrate 30 MG 24 hr tablet   Commonly known as: IMDUR   Take 1 tablet (30 mg total) by mouth daily.      Krill Oil 300 MG Caps   Take 1 capsule by mouth daily.      L-Lysine 500 MG Caps   Take 1 capsule by mouth 2 (two) times daily.      nisoldipine 17 MG 24 hr tablet   Commonly known as: SULAR   TAKE 1 TABLET EVERY DAY      nitroGLYCERIN 0.4 MG SL tablet   Commonly known as: NITROSTAT   Place 1 tablet (0.4 mg total) under the tongue  every 5 (five) minutes x 3 doses as needed for chest pain.      oyster calcium 500 MG Tabs   Take 500 mg of elemental calcium by mouth daily.      potassium chloride 10 MEQ tablet   Commonly known as: K-DUR   Take 2 tablets (20 mEq total) by mouth daily.      vitamin C 500 MG tablet   Commonly known as: ASCORBIC ACID   Take 500 mg by mouth daily.      Vitamin D-3 5000 UNITS Tabs   Take 1 tablet by mouth daily.      vitamin E 400 UNIT capsule   Take 400 Units by mouth daily.      Outstanding Labs/Studies  BMET planned @ time of f/u later this week.  Duration of Discharge Encounter   Greater than 30 minutes including physician time.  Signed, [name] NP 08/13/2012, 2:31 PM   Hillis Range, MD

## 2012-08-13 NOTE — Progress Notes (Signed)
DC orders received.  Patient stable with no S/S of distress.  Medication and discharge information reviewed with patient and family member.  Patient DC home. Nolon Nations

## 2012-08-13 NOTE — Progress Notes (Signed)
ANTICOAGULATION CONSULT NOTE - Follow Up Consult  Pharmacy Consult for heparin Indication: chest pain/ACS and atrial fibrillation  Labs:  Basename 08/13/12 0530 08/12/12 1559 08/12/12 0640 08/11/12 0848 08/11/12 0520 08/11/12 0300 08/10/12 2138 08/10/12 1606  HGB 11.1* -- 11.7* -- -- -- -- --  HCT 32.4* -- 34.5* -- 33.3* -- -- --  PLT 246 -- 263 -- 262 -- -- --  APTT -- -- -- -- -- -- -- --  LABPROT -- -- -- -- -- -- -- --  INR -- -- -- -- -- -- -- --  HEPARINUNFRC 0.42 0.22* 0.28* -- -- -- -- --  CREATININE 1.97* -- 1.96* -- 1.97* -- -- --  CKTOTAL -- -- -- -- -- 64 68 76  CKMB -- -- -- -- -- 3.7 3.9 3.9  TROPONINI -- -- -- <0.30 -- <0.30 <0.30 --    Assessment/Plan:  78yo female now therapeutic on heparin for CP and Afib.  Will continue gtt at current rate and confirm stable with additional level.  Colleen Can PharmD BCPS 08/13/2012,6:24 AM

## 2012-08-13 NOTE — Progress Notes (Signed)
Subjective:   78 y/o female with h/o CAD s/p NSTEMI and PCI (DES to OM) in 05/2012 who transfered from [facility] on 9/13 with afib and chf.  Started on amio and diuresed. Now feeling much better. Ambulating room independently. No CP/SOB.  She wants to go home today.  Current meds:    . amiodarone  400 mg Oral BID  . aspirin EC  325 mg Oral Daily  . bisoprolol  10 mg Oral Daily  . clopidogrel  75 mg Oral Q breakfast  . potassium chloride  40 mEq Oral Once  . sodium chloride  3 mL Intravenous Q12H  . DISCONTD: aspirin EC  81 mg Oral Daily  . DISCONTD: atenolol  25 mg Oral Daily  . DISCONTD: bisoprolol  5 mg Oral Daily   Infusions:    . DISCONTD: heparin 1,050 Units/hr (08/12/12 0941)  . DISCONTD: heparin 1,200 Units/hr (08/12/12 2138)     Objective:  Blood pressure 121/67, pulse 86, temperature 97.7 F (36.5 C), temperature source Oral, resp. rate 18, height 5\' 1"  (1.549 m), weight 129 lb 3 oz (58.6 kg), SpO2 100.00%. Weight change:   Physical Exam: General:  Elderly. Frail. No resp difficulty HEENT: OP clear Neck: supple. JVP 7 . Carotids 2+ bilat; no bruits. No lymphadenopathy or thryomegaly appreciated. Cor: PMI nondisplaced. Irregular rate & rhythm. No rubs, gallops 2/6 SEM RUSB Lungs: few basilar rales Abdomen: soft, nontender, nondistended. No hepatosplenomegaly. No bruits or masses. Good bowel sounds. Extremities: no cyanosis, clubbing, rash, edema Neuro: alert & orientedx3, cranial nerves grossly intact. moves all 4 extremities w/o difficulty. Affect pleasant  Telemetry:  AF with L bundle 80-90s  Lab Results: Basic Metabolic Panel:  Lab 08/13/12 2725 08/12/12 0640 08/11/12 0520 08/10/12 1107  NA 131* 135 137 130*  K 3.0* 3.7 -- --  CL 94* 96 96 93*  CO2 23 27 28 25   GLUCOSE 95 96 98 152*  BUN 34* 37* 31* 26*  CREATININE 1.97* 1.96* 1.97* 1.72*  CALCIUM 8.8 8.7 9.1 9.4  MG -- -- -- --  PHOS -- -- -- --   Liver Function Tests: No results found for this  basename: AST:5,ALT:5,ALKPHOS:5,BILITOT:5,PROT:5,ALBUMIN:5 in the last 168 hours No results found for this basename: LIPASE:5,AMYLASE:5 in the last 168 hours No results found for this basename: AMMONIA:5 in the last 168 hours CBC:  Lab 08/13/12 0530 08/12/12 0640 08/11/12 0520 08/10/12 1107  WBC 9.4 10.4 11.3* 11.5*  NEUTROABS -- -- -- 10.1*  HGB 11.1* 11.7* 11.5* 11.8*  HCT 32.4* 34.5* 33.3* 34.2*  MCV 87.8 88.0 87.9 87.2  PLT 246 263 262 285   Cardiac Enzymes:  Lab 08/11/12 0848 08/11/12 0300 08/10/12 2138 08/10/12 1606 08/10/12 1121  CKTOTAL -- 64 68 76 --  CKMB -- 3.7 3.9 3.9 --  CKMBINDEX -- -- -- -- --  TROPONINI <0.30 <0.30 <0.30 <0.30 <0.30   BNP: No components found with this basename: POCBNP:5 CBG: No results found for this basename: GLUCAP:5 in the last 168 hours Microbiology: No results found for this basename: cult   No results found for this basename: CULT:2,SDES:2 in the last 168 hours  Imaging: No results found.   ASSESSMENT/ PLan:  1. Acute respiratory failure - resolved 2. A systolic HF in setting of AF with RVR afib is better rate controlled. Not on ace inhibitor due to renal failure Add lasix 40mg  daily and Kdur daily at discharge Will need BMET later this week 3. A/c renal failure - baselien ~1.7  4. CAD -s/p NSTEM/ DES to OM in 7/13 5. AF with RVR - now on Amio Not felt to be a good candidate for anticoagulation at this point by Dr Gala Romney Continue amiodaroen 400mg  BID x 1 week then 200mg  daily thereafter for rate control of her afib Consider starting anticoagulation in the outpatient setting with possible elective cardioversion in the future Increase bisoprolol to 10mg  daily for better rate control 6. LBBB 7. COPD  Pt is clear that she wants to go home today DC to home Will need follow-up with Dr Excell Seltzer or Tereso Newcomer later this week with BMET.  Hillis Range, MD 08/13/2012, 7:53 AM

## 2012-08-13 NOTE — Progress Notes (Signed)
Physical Therapy Treatment Patient Details Name: [name] MRN: [id number] DOB: 03-18-1930 Today's Date: 08/13/2012 Time: 4098-1191 PT Time Calculation (min): 21 min  PT Assessment / Plan / Recommendation Comments on Treatment Session  Pt moves well.  Moving around independently in room upon arrival.  Cont's to progress with ambulation & was able to initiate stair training today.      Follow Up Recommendations  No PT follow up;Other (comment)    Barriers to Discharge        Equipment Recommendations  None recommended by PT    Recommendations for Other Services    Frequency Min 3X/week   Plan Discharge plan remains appropriate    Precautions / Restrictions Restrictions Weight Bearing Restrictions: No    Pertinent Vitals/Pain No pain reported    Mobility  Bed Mobility Bed Mobility: Not assessed Transfers Transfers: Stand to Sit;Sit to Stand Sit to Stand: 6: Modified independent (Device/Increase time) Stand to Sit: 6: Modified independent (Device/Increase time) Ambulation/Gait Ambulation/Gait Assistance: 5: Supervision Ambulation Distance (Feet): 200 Feet (120' + 32' ) Assistive device: None Ambulation/Gait Assistance Details: Seated rest break due to fatigue & pt c/o SOB & LE weakness.  Encouagement to increase step/stide length & reciprocal arm swing.   Gait Pattern: Decreased stride length;Narrow base of support Gait velocity: slower Stairs: Yes Stairs Assistance: 4: Min guard Stair Management Technique: One rail Right;Forwards;Alternating pattern Number of Stairs: 10  Wheelchair Mobility Wheelchair Mobility: No     PT Goals Acute Rehab PT Goals Time For Goal Achievement: 08/18/12 Potential to Achieve Goals: Good PT Goal: Ambulate - Progress: Progressing toward goal PT Goal: Up/Down Stairs - Progress: Progressing toward goal  Visit Information  Last PT Received On: 08/13/12 Assistance Needed: +1    Subjective Data  Patient Stated Goal: Home  independent   Cognition  Overall Cognitive Status: Appears within functional limits for tasks assessed/performed Arousal/Alertness: Awake/alert Orientation Level: Oriented X4 / Intact Behavior During Session: [facility] for tasks performed    Balance  Balance Balance Assessed: No  End of Session PT - End of Session Equipment Utilized During Treatment: Gait belt Activity Tolerance: Patient tolerated treatment well Patient left: in chair;with call bell/phone within reach Nurse Communication: Mobility status     [name], [place] [phone] 08/13/2012

## 2012-08-14 ENCOUNTER — Emergency Department (HOSPITAL_COMMUNITY): Payer: Medicare Other

## 2012-08-14 ENCOUNTER — Telehealth: Payer: Self-pay | Admitting: Cardiology

## 2012-08-14 ENCOUNTER — Inpatient Hospital Stay (HOSPITAL_COMMUNITY)
Admission: EM | Admit: 2012-08-14 | Discharge: 2012-08-18 | DRG: 291 | Disposition: A | Discharge status: Home / Self Care | Payer: Medicare Other | Attending: Internal Medicine | Admitting: Internal Medicine

## 2012-08-14 ENCOUNTER — Encounter (HOSPITAL_COMMUNITY): Payer: Self-pay | Admitting: *Deleted

## 2012-08-14 DIAGNOSIS — M7989 Other specified soft tissue disorders: Secondary | ICD-10-CM

## 2012-08-14 DIAGNOSIS — N189 Chronic kidney disease, unspecified: Secondary | ICD-10-CM

## 2012-08-14 DIAGNOSIS — I359 Nonrheumatic aortic valve disorder, unspecified: Secondary | ICD-10-CM

## 2012-08-14 DIAGNOSIS — I5033 Acute on chronic diastolic (congestive) heart failure: Secondary | ICD-10-CM

## 2012-08-14 DIAGNOSIS — J9692 Respiratory failure, unspecified with hypercapnia: Secondary | ICD-10-CM

## 2012-08-14 DIAGNOSIS — I5043 Acute on chronic combined systolic (congestive) and diastolic (congestive) heart failure: Secondary | ICD-10-CM

## 2012-08-14 DIAGNOSIS — I4891 Unspecified atrial fibrillation: Secondary | ICD-10-CM | POA: Diagnosis present

## 2012-08-14 DIAGNOSIS — N184 Chronic kidney disease, stage 4 (severe): Secondary | ICD-10-CM

## 2012-08-14 DIAGNOSIS — E785 Hyperlipidemia, unspecified: Secondary | ICD-10-CM

## 2012-08-14 DIAGNOSIS — I509 Heart failure, unspecified: Secondary | ICD-10-CM

## 2012-08-14 DIAGNOSIS — I119 Hypertensive heart disease without heart failure: Secondary | ICD-10-CM

## 2012-08-14 DIAGNOSIS — E871 Hypo-osmolality and hyponatremia: Secondary | ICD-10-CM

## 2012-08-14 DIAGNOSIS — I2510 Atherosclerotic heart disease of native coronary artery without angina pectoris: Secondary | ICD-10-CM | POA: Diagnosis present

## 2012-08-14 DIAGNOSIS — I200 Unstable angina: Secondary | ICD-10-CM

## 2012-08-14 DIAGNOSIS — R011 Cardiac murmur, unspecified: Secondary | ICD-10-CM

## 2012-08-14 DIAGNOSIS — I5021 Acute systolic (congestive) heart failure: Secondary | ICD-10-CM

## 2012-08-14 DIAGNOSIS — R0989 Other specified symptoms and signs involving the circulatory and respiratory systems: Secondary | ICD-10-CM

## 2012-08-14 DIAGNOSIS — R002 Palpitations: Secondary | ICD-10-CM

## 2012-08-14 DIAGNOSIS — J9600 Acute respiratory failure, unspecified whether with hypoxia or hypercapnia: Secondary | ICD-10-CM | POA: Diagnosis present

## 2012-08-14 DIAGNOSIS — M549 Dorsalgia, unspecified: Secondary | ICD-10-CM

## 2012-08-14 DIAGNOSIS — R079 Chest pain, unspecified: Secondary | ICD-10-CM

## 2012-08-14 HISTORY — DX: Chronic combined systolic (congestive) and diastolic (congestive) heart failure: I50.42

## 2012-08-14 MED ORDER — SODIUM CHLORIDE 0.9 % IV SOLN
20.0000 mL | INTRAVENOUS | Status: DC
  Administered 2012-08-15: 20 mL via INTRAVENOUS

## 2012-08-14 NOTE — Telephone Encounter (Signed)
plz return call to patient who would like to discuss medication, thinks she may be having side affects (nauseau, SOB, Shaking).  I could not understand which medication it was, but she would like to speak with nurse about it. Please return call to patient at [phone].

## 2012-08-14 NOTE — Telephone Encounter (Signed)
PT is calling stating she cannot tolerate amiordarone due to nausea, vomiting and inability to ambulate without holding on to something--very shaky--advised to d/c amiodarone until post hosp f/u with [facility] on Friday 9/20 --pt agrees--information shared with dr Excell Seltzer

## 2012-08-14 NOTE — ED Notes (Signed)
C/o sob, onset 1400 today, onset after taking amiodarone, recently d/c'd from [facility] after being tx'd for chf. LS decreased, increased wob, resps shallow, low SPO2 on RA (84%), hands warm & pink, cap refill <2sec. Daughter with pt.

## 2012-08-14 NOTE — Telephone Encounter (Signed)
New problem   Daughter calling back with fax number info, drop off FMLA paperwork today.   [employer] fax # [phone]. Attention Atilano Median

## 2012-08-15 ENCOUNTER — Encounter (HOSPITAL_COMMUNITY): Payer: Self-pay | Admitting: Internal Medicine

## 2012-08-15 DIAGNOSIS — I5021 Acute systolic (congestive) heart failure: Secondary | ICD-10-CM

## 2012-08-15 DIAGNOSIS — J9600 Acute respiratory failure, unspecified whether with hypoxia or hypercapnia: Secondary | ICD-10-CM

## 2012-08-15 DIAGNOSIS — I2510 Atherosclerotic heart disease of native coronary artery without angina pectoris: Secondary | ICD-10-CM | POA: Diagnosis present

## 2012-08-15 DIAGNOSIS — I5043 Acute on chronic combined systolic (congestive) and diastolic (congestive) heart failure: Principal | ICD-10-CM

## 2012-08-15 DIAGNOSIS — N189 Chronic kidney disease, unspecified: Secondary | ICD-10-CM

## 2012-08-15 LAB — URINALYSIS, ROUTINE W REFLEX MICROSCOPIC
Ketones, ur: NEGATIVE mg/dL
Nitrite: NEGATIVE
Protein, ur: 100 mg/dL — AB
Urobilinogen, UA: 0.2 mg/dL (ref 0.0–1.0)
pH: 5 (ref 5.0–8.0)

## 2012-08-15 LAB — CBC WITH DIFFERENTIAL/PLATELET
Basophils Absolute: 0 10*3/uL (ref 0.0–0.1)
Basophils Absolute: 0 10*3/uL (ref 0.0–0.1)
Basophils Relative: 0 % (ref 0–1)
Basophils Relative: 0 % (ref 0–1)
Eosinophils Relative: 0 % (ref 0–5)
HCT: 34.2 % — ABNORMAL LOW (ref 36.0–46.0)
Hemoglobin: 10.3 g/dL — ABNORMAL LOW (ref 12.0–15.0)
Lymphocytes Relative: 4 % — ABNORMAL LOW (ref 12–46)
Lymphocytes Relative: 5 % — ABNORMAL LOW (ref 12–46)
MCHC: 34.6 g/dL (ref 30.0–36.0)
MCHC: 34.8 g/dL (ref 30.0–36.0)
MCV: 87.9 fL (ref 78.0–100.0)
Monocytes Absolute: 0.4 10*3/uL (ref 0.1–1.0)
Neutro Abs: 8.9 10*3/uL — ABNORMAL HIGH (ref 1.7–7.7)
Neutrophils Relative %: 87 % — ABNORMAL HIGH (ref 43–77)
RDW: 12.5 % (ref 11.5–15.5)
RDW: 12.5 % (ref 11.5–15.5)
WBC: 10.3 10*3/uL (ref 4.0–10.5)

## 2012-08-15 LAB — POCT I-STAT 3, ART BLOOD GAS (G3+)
Acid-base deficit: 1 mmol/L (ref 0.0–2.0)
Bicarbonate: 24 mEq/L (ref 20.0–24.0)
O2 Saturation: 94 %
Patient temperature: 98
TCO2: 28 mmol/L (ref 0–100)
TCO2: 25 mmol/L (ref 0–100)
pCO2 arterial: 71.2 mmHg (ref 35.0–45.0)
pH, Arterial: 7.171 — CL (ref 7.350–7.450)
pH, Arterial: 7.369 (ref 7.350–7.450)
pO2, Arterial: 89 mmHg (ref 80.0–100.0)

## 2012-08-15 LAB — TROPONIN I
Troponin I: <0.3 ng/mL (ref <0.30)
Troponin I: <0.3 ng/mL (ref <0.30)

## 2012-08-15 LAB — POCT I-STAT, CHEM 8
BUN: 43 mg/dL — ABNORMAL HIGH (ref 6–23)
Calcium, Ion: 1.24 mmol/L (ref 1.13–1.30)
Chloride: 97 mEq/L (ref 96–112)
Glucose, Bld: 155 mg/dL — ABNORMAL HIGH (ref 70–99)
HCT: 36 % (ref 36.0–46.0)
Potassium: 4.4 mEq/L (ref 3.5–5.1)

## 2012-08-15 LAB — COMPREHENSIVE METABOLIC PANEL
ALT: 21 U/L (ref 0–35)
Albumin: 3.9 g/dL (ref 3.5–5.2)
Alkaline Phosphatase: 55 U/L (ref 39–117)
BUN: 41 mg/dL — ABNORMAL HIGH (ref 6–23)
BUN: 43 mg/dL — ABNORMAL HIGH (ref 6–23)
CO2: 21 mEq/L (ref 19–32)
Calcium: 9.2 mg/dL (ref 8.4–10.5)
Chloride: 91 mEq/L — ABNORMAL LOW (ref 96–112)
Creatinine, Ser: 2.04 mg/dL — ABNORMAL HIGH (ref 0.50–1.10)
GFR calc Af Amer: 25 mL/min — ABNORMAL LOW (ref >90)
GFR calc Af Amer: 25 mL/min — ABNORMAL LOW (ref >90)
GFR calc non Af Amer: 22 mL/min — ABNORMAL LOW (ref >90)
Glucose, Bld: 157 mg/dL — ABNORMAL HIGH (ref 70–99)
Glucose, Bld: 155 mg/dL — ABNORMAL HIGH (ref 70–99)
Sodium: 129 mEq/L — ABNORMAL LOW (ref 135–145)
Total Bilirubin: 0.4 mg/dL (ref 0.3–1.2)
Total Protein: 6.8 g/dL (ref 6.0–8.3)

## 2012-08-15 LAB — FOLATE: Folate: 9.9 ng/mL

## 2012-08-15 LAB — MAGNESIUM: Magnesium: 2.3 mg/dL (ref 1.5–2.5)

## 2012-08-15 LAB — LIPASE, BLOOD: Lipase: 53 U/L (ref 11–59)

## 2012-08-15 LAB — IRON AND TIBC
Iron: 34 ug/dL — ABNORMAL LOW (ref 42–135)
Saturation Ratios: 10 % — ABNORMAL LOW (ref 20–55)
UIBC: 310 ug/dL (ref 125–400)

## 2012-08-15 LAB — HEPARIN LEVEL (UNFRACTIONATED): Heparin Unfractionated: 0.58 IU/mL (ref 0.30–0.70)

## 2012-08-15 LAB — URINE MICROSCOPIC-ADD ON

## 2012-08-15 LAB — RETICULOCYTES: Retic Count, Absolute: 56.4 10*3/uL (ref 19.0–186.0)

## 2012-08-15 LAB — TSH: TSH: 1.037 u[IU]/mL (ref 0.350–4.500)

## 2012-08-15 MED ORDER — NITROGLYCERIN 2 % TD OINT
1.0000 [in_us] | TOPICAL_OINTMENT | Freq: Once | TRANSDERMAL | Status: AC
  Administered 2012-08-15: 1 [in_us] via TOPICAL
  Filled 2012-08-15: qty 1

## 2012-08-15 MED ORDER — NITROGLYCERIN 0.4 MG SL SUBL: 0.4000 mg | SUBLINGUAL_TABLET | SUBLINGUAL | Status: DC | PRN

## 2012-08-15 MED ORDER — HYDROCODONE-ACETAMINOPHEN 5-325 MG PO TABS: 1.0000 | ORAL_TABLET | Freq: Four times a day (QID) | ORAL | Status: DC | PRN

## 2012-08-15 MED ORDER — HEPARIN (PORCINE) IN NACL 100-0.45 UNIT/ML-% IJ SOLN
1200.0000 [IU]/h | INTRAMUSCULAR | Status: DC
  Administered 2012-08-15: 1200 [IU]/h via INTRAVENOUS
  Filled 2012-08-15 (×2): qty 250

## 2012-08-15 MED ORDER — ONDANSETRON HCL 4 MG/2ML IJ SOLN: 4.0000 mg | Freq: Four times a day (QID) | INTRAMUSCULAR | Status: DC | PRN

## 2012-08-15 MED ORDER — DEXTROSE 5 % IV SOLN
1.0000 g | INTRAVENOUS | Status: DC
  Administered 2012-08-15 – 2012-08-16 (×2): 1 g via INTRAVENOUS
  Filled 2012-08-15 (×2): qty 10

## 2012-08-15 MED ORDER — ONDANSETRON HCL 4 MG/2ML IJ SOLN
4.0000 mg | Freq: Once | INTRAMUSCULAR | Status: AC
  Administered 2012-08-15: 4 mg via INTRAVENOUS
  Filled 2012-08-15: qty 2

## 2012-08-15 MED ORDER — ACETAMINOPHEN 650 MG RE SUPP: 650.0000 mg | Freq: Four times a day (QID) | RECTAL | Status: DC | PRN

## 2012-08-15 MED ORDER — FUROSEMIDE 10 MG/ML IJ SOLN
60.0000 mg | Freq: Once | INTRAMUSCULAR | Status: AC
  Administered 2012-08-15: 60 mg via INTRAVENOUS
  Filled 2012-08-15: qty 6

## 2012-08-15 MED ORDER — POTASSIUM CHLORIDE CRYS ER 20 MEQ PO TBCR
EXTENDED_RELEASE_TABLET | ORAL | Status: AC
  Administered 2012-08-15: 20 meq
  Filled 2012-08-15: qty 1

## 2012-08-15 MED ORDER — BISOPROLOL FUMARATE 10 MG PO TABS
10.0000 mg | ORAL_TABLET | Freq: Every day | ORAL | Status: DC
  Administered 2012-08-15 – 2012-08-18 (×4): 10 mg via ORAL
  Filled 2012-08-15 (×4): qty 1

## 2012-08-15 MED ORDER — CLOPIDOGREL BISULFATE 75 MG PO TABS
75.0000 mg | ORAL_TABLET | Freq: Every day | ORAL | Status: DC
  Administered 2012-08-15 – 2012-08-18 (×4): 75 mg via ORAL
  Filled 2012-08-15 (×5): qty 1

## 2012-08-15 MED ORDER — OMEGA-3-ACID ETHYL ESTERS 1 G PO CAPS
1.0000 g | ORAL_CAPSULE | Freq: Two times a day (BID) | ORAL | Status: DC
  Administered 2012-08-15 – 2012-08-18 (×7): 1 g via ORAL
  Filled 2012-08-15 (×8): qty 1

## 2012-08-15 MED ORDER — ONDANSETRON HCL 4 MG PO TABS: 4.0000 mg | ORAL_TABLET | Freq: Four times a day (QID) | ORAL | Status: DC | PRN

## 2012-08-15 MED ORDER — IPRATROPIUM BROMIDE 0.02 % IN SOLN
0.5000 mg | Freq: Once | RESPIRATORY_TRACT | Status: AC
  Administered 2012-08-15: 0.5 mg via RESPIRATORY_TRACT
  Filled 2012-08-15: qty 2.5

## 2012-08-15 MED ORDER — SODIUM CHLORIDE 0.9 % IJ SOLN
3.0000 mL | Freq: Two times a day (BID) | INTRAMUSCULAR | Status: DC
  Administered 2012-08-15 – 2012-08-18 (×7): 3 mL via INTRAVENOUS

## 2012-08-15 MED ORDER — ENSURE COMPLETE PO LIQD
237.0000 mL | Freq: Two times a day (BID) | ORAL | Status: DC
  Administered 2012-08-15 – 2012-08-18 (×5): 237 mL via ORAL

## 2012-08-15 MED ORDER — POTASSIUM CHLORIDE ER 10 MEQ PO TBCR
20.0000 meq | EXTENDED_RELEASE_TABLET | Freq: Every day | ORAL | Status: DC
  Administered 2012-08-16 – 2012-08-17 (×2): 20 meq via ORAL
  Filled 2012-08-15 (×4): qty 2

## 2012-08-15 MED ORDER — SODIUM CHLORIDE 0.9 % IJ SOLN
3.0000 mL | Freq: Two times a day (BID) | INTRAMUSCULAR | Status: DC
  Administered 2012-08-15 – 2012-08-17 (×5): 3 mL via INTRAVENOUS

## 2012-08-15 MED ORDER — SODIUM CHLORIDE 0.9 % IV SOLN
INTRAVENOUS | Status: DC
  Administered 2012-08-15: 11:00:00 via INTRAVENOUS

## 2012-08-15 MED ORDER — ALPRAZOLAM 0.25 MG PO TABS
0.2500 mg | ORAL_TABLET | Freq: Four times a day (QID) | ORAL | Status: AC | PRN
  Administered 2012-08-15 – 2012-08-16 (×2): 0.25 mg via ORAL
  Filled 2012-08-15 (×2): qty 1

## 2012-08-15 MED ORDER — ALBUTEROL SULFATE (5 MG/ML) 0.5% IN NEBU
2.5000 mg | INHALATION_SOLUTION | Freq: Once | RESPIRATORY_TRACT | Status: AC
  Administered 2012-08-15: 2.5 mg via RESPIRATORY_TRACT
  Filled 2012-08-15: qty 0.5

## 2012-08-15 MED ORDER — AMIODARONE HCL 200 MG PO TABS
400.0000 mg | ORAL_TABLET | Freq: Every day | ORAL | Status: DC
  Filled 2012-08-15: qty 2

## 2012-08-15 MED ORDER — ACETAMINOPHEN 325 MG PO TABS
650.0000 mg | ORAL_TABLET | Freq: Four times a day (QID) | ORAL | Status: DC | PRN
  Administered 2012-08-15: 650 mg via ORAL
  Filled 2012-08-15: qty 2

## 2012-08-15 MED ORDER — FUROSEMIDE 10 MG/ML IJ SOLN
40.0000 mg | Freq: Every day | INTRAMUSCULAR | Status: DC
  Administered 2012-08-15: 40 mg via INTRAVENOUS
  Filled 2012-08-15 (×2): qty 4

## 2012-08-15 MED ORDER — ISOSORBIDE MONONITRATE ER 30 MG PO TB24
30.0000 mg | ORAL_TABLET | Freq: Every day | ORAL | Status: DC
  Administered 2012-08-15 – 2012-08-18 (×4): 30 mg via ORAL
  Filled 2012-08-15 (×6): qty 1

## 2012-08-15 MED ORDER — ASPIRIN EC 81 MG PO TBEC
81.0000 mg | DELAYED_RELEASE_TABLET | Freq: Every day | ORAL | Status: DC
  Administered 2012-08-16 – 2012-08-18 (×3): 81 mg via ORAL
  Filled 2012-08-15 (×4): qty 1

## 2012-08-15 MED ORDER — NISOLDIPINE ER 17 MG PO TB24
17.0000 mg | ORAL_TABLET | Freq: Every day | ORAL | Status: DC
  Administered 2012-08-15 – 2012-08-17 (×3): 17 mg via ORAL
  Filled 2012-08-15 (×4): qty 1

## 2012-08-15 MED ORDER — ALBUTEROL SULFATE (5 MG/ML) 0.5% IN NEBU
5.0000 mg | INHALATION_SOLUTION | Freq: Once | RESPIRATORY_TRACT | Status: AC
  Administered 2012-08-15: 5 mg via RESPIRATORY_TRACT
  Filled 2012-08-15: qty 1

## 2012-08-15 MED ORDER — HEPARIN BOLUS VIA INFUSION
3000.0000 [IU] | Freq: Once | INTRAVENOUS | Status: AC
  Administered 2012-08-15: 3000 [IU] via INTRAVENOUS
  Filled 2012-08-15: qty 3000

## 2012-08-15 MED ORDER — ASPIRIN 81 MG PO CHEW
CHEWABLE_TABLET | ORAL | Status: AC
  Administered 2012-08-15: 81 mg
  Filled 2012-08-15: qty 1

## 2012-08-15 NOTE — Progress Notes (Signed)
INITIAL ADULT NUTRITION ASSESSMENT Date: 08/15/2012   Time: 9:50 AM Reason for Assessment: MST (Malnutrition Screening Tool)   INTERVENTION: 1. Ensure Complete po BID, each supplement provides 350 kcal and 13 grams of protein. 2. RD will continue to follow    DOCUMENTATION CODES Per approved criteria  -Not Applicable    ASSESSMENT: Female 78 y.o.  Dx: Acute respiratory failure  Hx:  Past Medical History  Diagnosis Date  . Lower extremity edema     bilateral  . CKD (chronic kidney disease), stage III     a. baseline creat 1.6->2  . Labile hypertension   . Anxiety   . Failure to thrive in childhood   . History of shingles July 2004    on the left side of her thorax and postherpetic neuralgia  . History of psoriasis   . History of recurrent UTIs   . Major depressive disorder, single episode, severe 08-May-2003    "when my husband died"  . Valvular heart disease     a. 07/2012 Echo in setting of afib: EF 35%, Mild AI/MR, Mod Dil LA, Mod TR,  . CAD (coronary artery disease)     a. NSTEMI 05/2012 s/p DES to OM 06/07/12 (residual severe LCx subbranch dz for med rx unless refractory angina recurs)  . LBBB (left bundle branch block)     intermittent  . Chronic CHF     a. 05/2012 Echo: EF 60-65%, Gr 1 DD, mild AI/MR, Mod TR;  b. 07/2012 Echo in setting of afib: EF 35%, Mild AI/MR, Mod Dil LA, Mod TR, PASP .  . Atrial fibrillation     a. Dx 07/2012 -> Amio started.  No coumadin 2/2 ongoing DAPT    Past Surgical History  Procedure Date  . Salpingoophorectomy     "cyst removed"  . Appendectomy   . Breast cyst excision   . Abdominal hysterectomy   . Cataract extraction, bilateral ~ 05/07/09  . Cardiac surgery     Related Meds:     . albuterol  2.5 mg Nebulization Once  . albuterol  5 mg Nebulization Once  . amiodarone  400 mg Oral Daily  . aspirin      . aspirin EC  81 mg Oral Daily  . bisoprolol  10 mg Oral Daily  . cefTRIAXone (ROCEPHIN)  IV  1 g Intravenous Q24H  .  clopidogrel  75 mg Oral Q breakfast  . furosemide  40 mg Intravenous Daily  . furosemide  60 mg Intravenous Once  . heparin  3,000 Units Intravenous Once  . ipratropium  0.5 mg Nebulization Once  . isosorbide mononitrate  30 mg Oral Daily  . nisoldipine  17 mg Oral Daily  . nitroGLYCERIN  1 inch Topical Once  . omega-3 acid ethyl esters  1 g Oral BID  . ondansetron (ZOFRAN) IV  4 mg Intravenous Once  . potassium chloride  20 mEq Oral Daily  . sodium chloride  3 mL Intravenous Q12H  . sodium chloride  3 mL Intravenous Q12H     Ht: 5\' 1"  (154.9 cm)  Wt: 123 lb 14.4 oz (56.2 kg)  Ideal Wt: 47.7 kg  % Ideal Wt: 118%  Usual Wt: 128 lbs (58.2 kg ) per pt report Wt Readings from Last 5 Encounters:  08/15/12 123 lb 14.4 oz (56.2 kg)  08/11/12 129 lb 3 oz (58.6 kg)  06/18/12 130 lb (58.968 kg)  06/07/12 130 lb (58.968 kg)  06/07/12 130 lb (58.968 kg)    %  Usual Wt: 95%  Body mass index is 23.41 kg/(m^2). Pt is WNL per current BMI   Food/Nutrition Related Hx: Pt reports poor appetite and weight loss with increased SOB and vomiting   Labs:  CMP     Component Value Date/Time   NA 129* 08/15/2012 0610   K 3.8 08/15/2012 0610   CL 92* 08/15/2012 0610   CO2 21 08/15/2012 0610   GLUCOSE 155* 08/15/2012 0610   BUN 43* 08/15/2012 0610   CREATININE 2.05* 08/15/2012 0610   CALCIUM 9.2 08/15/2012 0610   PROT 6.8 08/15/2012 0610   ALBUMIN 3.4* 08/15/2012 0610   AST 22 08/15/2012 0610   ALT 21 08/15/2012 0610   ALKPHOS 55 08/15/2012 0610   BILITOT 0.3 08/15/2012 0610   GFRNONAA 22* 08/15/2012 0610   GFRAA 25* 08/15/2012 0610    Intake/Output Summary (Last 24 hours) at 08/15/12 0953 Last data filed at 08/15/12 0908  Gross per 24 hour  Intake    120 ml  Output   1150 ml  Net  -1030 ml     Diet Order: Cardiac  Supplements/Tube Feeding: none   IVF:    sodium chloride   heparin Last Rate: 1,200 Units/hr (08/15/12 0900)  DISCONTD: sodium chloride Last Rate: 20 mL (08/15/12 0111)     Estimated Nutritional Needs:   Kcal: 1400-1600 Protein: 50-65 gm  Fluid:  1.4-1.6 L   Pt just d/c'd 2 days ago. Pt was readmitted with SOB and vomiting, also in A-fib.  Pt states that for several weeks she has not had much appetite. Lost 5% body weight in 2 months, not significant. RD observed breakfast tray at bedside, only consumed some juice. Pt states she does not want solids r/t discomfort from vomiting. Agreeable to Ensure.   NUTRITION DIAGNOSIS: -Inadequate oral intake (NI-2.1).  Status: Ongoing  RELATED TO: SOB, emesis   AS EVIDENCE BY: weight loss   MONITORING/EVALUATION(Goals): Goal: PO intake to meet 90-100% estimated nutrition needs  Monitor: PO intake, weight, labs  EDUCATION NEEDS: -No education needs identified at this time   Clarene Duke RD, LDN Pager [phone] After Hours pager [phone]  08/15/2012, 9:50 AM

## 2012-08-15 NOTE — Progress Notes (Signed)
ANTICOAGULATION CONSULT NOTE - Initial Consult  Pharmacy Consult for heparin Indication: chest pain/ACS and atrial fibrillation  Allergies  Allergen Reactions  . Metoprolol Swelling    Takes bisoprolol without issue  . Norvasc (Amlodipine Besylate) Swelling    Takes nisoldipine without issue  . Sulfonamide Derivatives Hives, Itching and Swelling  . Statins     Myalgias - "I won't take them"    Patient Measurements: Height: 5\' 1"  (154.9 cm) Weight: 123 lb 14.4 oz (56.2 kg) IBW/kg (Calculated) : 47.8   Vital Signs: Temp: 98.2 F (36.8 C) (09/18 0430) Temp src: Oral (09/18 0430) BP: 116/50 mmHg (09/18 0430) Pulse Rate: 59  (09/18 0430)  Labs:  Basename 08/15/12 0035 08/15/12 0007 08/15/12 0006 08/13/12 0530 08/12/12 1559 08/12/12 0640  HGB 12.2 -- 11.9* -- -- --  HCT 36.0 -- 34.2* 32.4* -- --  PLT -- -- 360 246 -- 263  APTT -- -- -- -- -- --  LABPROT -- -- -- -- -- --  INR -- -- -- -- -- --  HEPARINUNFRC -- -- -- 0.42 0.22* 0.28*  CREATININE 2.00* -- 2.04* 1.97* -- --  CKTOTAL -- 72 -- -- -- --  CKMB -- 5.1* -- -- -- --  TROPONINI -- <0.30 -- -- -- --    Estimated Creatinine Clearance: 16.9 ml/min (by C-G formula based on Cr of 2).   Medical History: Past Medical History  Diagnosis Date  . Lower extremity edema     bilateral  . CKD (chronic kidney disease), stage III     a. baseline creat 1.6->2  . Labile hypertension   . Anxiety   . Failure to thrive in childhood   . History of shingles July 2004    on the left side of her thorax and postherpetic neuralgia  . History of psoriasis   . History of recurrent UTIs   . Major depressive disorder, single episode, severe 19-May-2003    "when my husband died"  . Valvular heart disease     a. 07/2012 Echo in setting of afib: EF 35%, Mild AI/MR, Mod Dil LA, Mod TR,  . CAD (coronary artery disease)     a. NSTEMI 05/2012 s/p DES to OM 06/07/12 (residual severe LCx subbranch dz for med rx unless refractory angina recurs)  .  LBBB (left bundle branch block)     intermittent  . Chronic CHF     a. 05/2012 Echo: EF 60-65%, Gr 1 DD, mild AI/MR, Mod TR;  b. 07/2012 Echo in setting of afib: EF 35%, Mild AI/MR, Mod Dil LA, Mod TR, PASP .  . Atrial fibrillation     a. Dx 07/2012 -> Amio started.  No coumadin 2/2 ongoing DAPT    Medications:  Prescriptions prior to admission  Medication Sig Dispense Refill  . amiodarone (PACERONE) 200 MG tablet Take 400 mg by mouth daily.      Marland Kitchen aspirin EC 81 MG tablet Take 1 tablet (81 mg total) by mouth daily.      Marland Kitchen b complex vitamins tablet Take 1 tablet by mouth daily.        . bisoprolol (ZEBETA) 10 MG tablet Take 1 tablet (10 mg total) by mouth daily.  30 tablet  6  . Cholecalciferol (VITAMIN D-3) 5000 UNITS TABS Take 1 tablet by mouth daily.      . Cinnamon 500 MG TABS Take 1 tablet by mouth 2 (two) times daily.       . clopidogrel (PLAVIX) 75 MG tablet Take  1 tablet (75 mg total) by mouth daily with breakfast.  30 tablet  11  . Cod Liver Oil CAPS Take 1 capsule by mouth daily.      . fish oil-omega-3 fatty acids 1000 MG capsule Take 1 g by mouth 2 (two) times daily.       . furosemide (LASIX) 40 MG tablet Take 1 tablet (40 mg total) by mouth daily.  30 tablet  6  . HYDROcodone-acetaminophen (VICODIN) 5-500 MG per tablet Take 1 tablet by mouth every 6 (six) hours as needed. For pain  30 tablet  1  . isosorbide mononitrate (IMDUR) 30 MG 24 hr tablet Take 1 tablet (30 mg total) by mouth daily.  30 tablet  6  . Krill Oil 300 MG CAPS Take 1 capsule by mouth daily.       Marland Kitchen L-Lysine 500 MG CAPS Take 1 capsule by mouth 2 (two) times daily.      . nisoldipine (SULAR) 17 MG 24 hr tablet Take 17 mg by mouth daily.      . nitroGLYCERIN (NITROSTAT) 0.4 MG SL tablet Place 1 tablet (0.4 mg total) under the tongue every 5 (five) minutes x 3 doses as needed for chest pain.  25 tablet  4  . Oyster Shell (OYSTER CALCIUM) 500 MG TABS Take 500 mg of elemental calcium by mouth daily.      .  potassium chloride (K-DUR) 10 MEQ tablet Take 2 tablets (20 mEq total) by mouth daily.  60 tablet  6  . vitamin C (ASCORBIC ACID) 500 MG tablet Take 500 mg by mouth daily.      . vitamin E 400 UNIT capsule Take 400 Units by mouth daily.         Scheduled:    . albuterol  2.5 mg Nebulization Once  . albuterol  5 mg Nebulization Once  . amiodarone  400 mg Oral Daily  . aspirin EC  81 mg Oral Daily  . bisoprolol  10 mg Oral Daily  . cefTRIAXone (ROCEPHIN)  IV  1 g Intravenous Q24H  . clopidogrel  75 mg Oral Q breakfast  . furosemide  40 mg Intravenous Daily  . furosemide  60 mg Intravenous Once  . heparin  3,000 Units Intravenous Once  . ipratropium  0.5 mg Nebulization Once  . isosorbide mononitrate  30 mg Oral Daily  . nisoldipine  17 mg Oral Daily  . nitroGLYCERIN  1 inch Topical Once  . omega-3 acid ethyl esters  1 g Oral BID  . ondansetron (ZOFRAN) IV  4 mg Intravenous Once  . potassium chloride  20 mEq Oral Daily  . sodium chloride  3 mL Intravenous Q12H  . sodium chloride  3 mL Intravenous Q12H    Assessment: 78yo female discharged <24hr ago now c/o SOB and N/V to begin heparin for Afib and r/o ACS.  Goal of Therapy:  Heparin level 0.3-0.7 units/ml Monitor platelets by anticoagulation protocol: Yes   Plan:  Will give heparin 3000 units IV bolus x1 and begin heparin gtt at 1200 units/hr (rate at which pt was therapeutic just a few days ago) and monitor heparin levels and CBC.  Colleen Can PharmD BCPS 08/15/2012,5:02 AM

## 2012-08-15 NOTE — ED Provider Notes (Signed)
History     CSN: [id number]  Arrival date & time 08/14/12  2318-05-13   First MD Initiated Contact with Patient 08/14/12 05-13-37      Chief Complaint  Patient presents with  . Shortness of Breath    (Consider location/radiation/quality/duration/timing/severity/associated sxs/prior treatment) HPI Comments: [name] presents with her daughter for evaluation of shortness of breath.  She was discharged yesterday after an evaluation and treatment here for CHF and afib.  She discontinued taking diovan and started taking amiodarone.  She reports that after taking the amiodarone she started vomiting and has vomited throughout the day.  She denies fever or pain.  At the same time she has had progressively worsening shortness of breath.  Patient is a 78 y.o. female presenting with shortness of breath. The history is provided by the patient and a relative. No language interpreter was used.  Shortness of Breath  The current episode started today. The problem has been rapidly worsening. The problem is severe. Nothing relieves the symptoms. The symptoms are aggravated by activity. Associated symptoms include orthopnea, cough, shortness of breath and wheezing. Pertinent negatives include no chest pain, no chest pressure, no fever, no rhinorrhea, no sore throat and no stridor. Urine output has been normal. The last void occurred less than 6 hours ago. Recently, medical care has been given at this facility. Services Performed: started on amiodorone.    Past Medical History  Diagnosis Date  . Lower extremity edema     bilateral  . CKD (chronic kidney disease), stage III     a. baseline creat 1.6->2  . Labile hypertension   . Anxiety   . Failure to thrive in childhood   . History of shingles July 2004    on the left side of her thorax and postherpetic neuralgia  . History of psoriasis   . History of recurrent UTIs   . Major depressive disorder, single episode, severe 2003-05-14    "when my husband died"  .  Valvular heart disease     a. 07/2012 Echo in setting of afib: EF 35%, Mild AI/MR, Mod Dil LA, Mod TR,  . CAD (coronary artery disease)     a. NSTEMI 05/2012 s/p DES to OM 06/07/12 (residual severe LCx subbranch dz for med rx unless refractory angina recurs)  . LBBB (left bundle branch block)     intermittent  . Chronic CHF     a. 05/2012 Echo: EF 60-65%, Gr 1 DD, mild AI/MR, Mod TR;  b. 07/2012 Echo in setting of afib: EF 35%, Mild AI/MR, Mod Dil LA, Mod TR, PASP .  . Atrial fibrillation     a. Dx 07/2012 -> Amio started.  No coumadin 2/2 ongoing DAPT    Past Surgical History  Procedure Date  . Salpingoophorectomy     "cyst removed"  . Appendectomy   . Breast cyst excision   . Abdominal hysterectomy   . Cataract extraction, bilateral ~ May 13, 2009  . Cardiac surgery     Family History  Problem Relation Age of Onset  . Diabetes    . Cancer    . Lung cancer      History  Substance Use Topics  . Smoking status: Former Smoker -- 1.5 packs/day for 30 years    Types: Cigarettes    Quit date: 09/01/1979  . Smokeless tobacco: Never Used  . Alcohol Use: Yes     06/06/12 "mixed drink once or twice a year.to celebrate"    OB History    Grav  Para Term Preterm Abortions TAB SAB Ect Mult Living                  Review of Systems  Constitutional: Positive for appetite change and fatigue. Negative for fever, chills, diaphoresis and activity change.  HENT: Negative for congestion, sore throat, facial swelling, rhinorrhea, neck pain, neck stiffness, postnasal drip and sinus pressure.   Eyes: Negative.   Respiratory: Positive for cough, shortness of breath and wheezing. Negative for choking, chest tightness and stridor.   Cardiovascular: Positive for orthopnea. Negative for chest pain, palpitations and leg swelling.  Gastrointestinal: Positive for nausea and vomiting. Negative for abdominal pain, diarrhea, constipation and abdominal distention.  Genitourinary: Negative.   Musculoskeletal:  Negative.   Skin: Positive for pallor. Negative for color change, rash and wound.  Neurological: Positive for weakness. Negative for dizziness, tremors, syncope and light-headedness.  Hematological: Negative.   Psychiatric/Behavioral: Negative.     Allergies  Metoprolol; Norvasc; Sulfonamide derivatives; and Statins  Home Medications   Current Outpatient Rx  Name Route Sig Dispense Refill  . AMIODARONE HCL 200 MG PO TABS Oral Take 1 tablet (200 mg total) by mouth daily. 30 tablet 6    TO BE STARTED IN 1 WEEK AFTER YOU HAVE COMPLETED T ...  . AMIODARONE HCL 400 MG PO TABS Oral Take 1 tablet (400 mg total) by mouth 2 (two) times daily. 14 tablet 0    AFTER YOU HAVE COMPLETED THIS Rx, BEGIN AMIODARONE .Marland Kitchen.  . ASPIRIN EC 81 MG PO TBEC Oral Take 1 tablet (81 mg total) by mouth daily.    . B COMPLEX PO TABS Oral Take 1 tablet by mouth daily.      Marland Kitchen BISOPROLOL FUMARATE 10 MG PO TABS Oral Take 1 tablet (10 mg total) by mouth daily. 30 tablet 6  . VITAMIN D-3 5000 UNITS PO TABS Oral Take 1 tablet by mouth daily.    Marland Kitchen CINNAMON 500 MG PO TABS Oral Take 1 tablet by mouth 2 (two) times daily.     Marland Kitchen CLOPIDOGREL BISULFATE 75 MG PO TABS Oral Take 1 tablet (75 mg total) by mouth daily with breakfast. 30 tablet 11  . COD LIVER OIL PO CAPS Oral Take 1 capsule by mouth daily.    . OMEGA-3 FATTY ACIDS 1000 MG PO CAPS Oral Take 1 g by mouth 2 (two) times daily.     . FUROSEMIDE 40 MG PO TABS Oral Take 1 tablet (40 mg total) by mouth daily. 30 tablet 6  . HYDROCODONE-ACETAMINOPHEN 5-500 MG PO TABS Oral Take 1 tablet by mouth every 6 (six) hours as needed. For pain 30 tablet 1  . ISOSORBIDE MONONITRATE ER 30 MG PO TB24 Oral Take 1 tablet (30 mg total) by mouth daily. 30 tablet 6  . KRILL OIL 300 MG PO CAPS Oral Take 1 capsule by mouth daily.     Marland Kitchen L-LYSINE 500 MG PO CAPS Oral Take 1 capsule by mouth 2 (two) times daily.    Marland Kitchen NISOLDIPINE ER 17 MG PO TB24  TAKE 1 TABLET EVERY DAY 30 tablet 11  . NITROGLYCERIN  0.4 MG SL SUBL Sublingual Place 1 tablet (0.4 mg total) under the tongue every 5 (five) minutes x 3 doses as needed for chest pain. 25 tablet 4  . OYSTER CALCIUM 500 MG PO TABS Oral Take 500 mg of elemental calcium by mouth daily.    Marland Kitchen POTASSIUM CHLORIDE ER 10 MEQ PO TBCR Oral Take 2 tablets (20 mEq total) by  mouth daily. 60 tablet 6  . VITAMIN C 500 MG PO TABS Oral Take 500 mg by mouth daily.    Marland Kitchen VITAMIN E 400 UNITS PO CAPS Oral Take 400 Units by mouth daily.        BP 171/72  Pulse 69  Temp 97.6 F (36.4 C) (Oral)  Resp 26  SpO2 98%  Physical Exam  Nursing note and vitals reviewed. Constitutional: She is oriented to person, place, and time. She is active and cooperative. She has a sickly appearance. She appears ill. She appears distressed. She is not intubated. Nasal cannula in place.  HENT:  Head: Normocephalic and atraumatic.  Right Ear: External ear normal.  Left Ear: External ear normal.  Nose: Nose normal.  Mouth/Throat: Oropharynx is clear and moist. No oropharyngeal exudate.  Eyes: Conjunctivae normal and EOM are normal. Pupils are equal, round, and reactive to light. Right eye exhibits no discharge. Left eye exhibits no discharge. No scleral icterus.       Pale conjunctiva  Neck: Normal range of motion. Neck supple. No JVD present. No tracheal deviation present. No thyromegaly present.  Cardiovascular: Normal rate, intact distal pulses and normal pulses.  An irregular rhythm present.  Extrasystoles are present. PMI is not displaced.  Exam reveals no decreased pulses.   Murmur heard. Pulmonary/Chest: Accessory muscle usage present. No stridor. Tachypnea noted. No apnea and not bradypneic. She is not intubated. She is in respiratory distress. She has decreased breath sounds. She has wheezes. She has no rhonchi. She has no rales.  Abdominal: Soft. Normal appearance and bowel sounds are normal. She exhibits no distension, no abdominal bruit, no ascites, no pulsatile midline mass  and no mass. There is no tenderness. There is no rigidity, no rebound, no guarding, no CVA tenderness, no tenderness at McBurney's point and negative Murphy's sign. No hernia.  Musculoskeletal: Normal range of motion. She exhibits no edema and no tenderness.  Lymphadenopathy:    She has no cervical adenopathy.  Neurological: She is alert and oriented to person, place, and time. No cranial nerve deficit.  Skin: Skin is warm. No rash noted. She is not diaphoretic. No erythema. There is pallor.    ED Course  Procedures (including critical care time)   Labs Reviewed  CBC WITH DIFFERENTIAL  PRO B NATRIURETIC PEPTIDE  COMPREHENSIVE METABOLIC PANEL  MAGNESIUM  URINALYSIS, ROUTINE W REFLEX MICROSCOPIC  TROPONIN I  BLOOD GAS, ARTERIAL  CK TOTAL AND CKMB   Dg Chest 2 View  08/15/2012  *RADIOLOGY REPORT*  Clinical Data: Shortness of breath with nausea vomiting.  CHEST - 2 VIEW  Comparison: 08/10/2012  Findings: Cardiac enlargement persists.  Calcified tortuous aorta. Mild changes consistent with pulmonary edema similar to priors. Bony demineralization.  IMPRESSION: Cardiac enlargement with mild pulmonary edema.  Similar to priors.   Original Report Authenticated By: Elsie Stain, M.D.      No diagnosis found.   Date: 08/15/2012  Rate: 70 bpm  Rhythm: sinus  QRS Axis: normal  Intervals:    ST/T Wave abnormalities: ST elevations anteriorly  Conduction Disutrbances:left bundle branch block  Narrative Interpretation:   Old EKG Reviewed: unchanged      MDM  Pt presents for evaluation of shortness of breath and vomiting.  She appears pale, acutely ill, with increased respiratory rate and effort.  She was discharged from the hosp < 24 hrs ago after treatment for atrial fibrillation and acute CHF.  Plan CXR, basic labs, trop, CK & MB, antiemetics.  Secondary  to significant dyspnea will also obtain an ABG.  CXR reviewed.  Mild pulmonary edema noted.  Pt is demonstrating some distress.   Bipap has been ordered.  0245.  ABG demonstrated elevated pCO2 (71.2) with a depressed pH (7.171).  Pt administered albuterol, atrovent, lasix, and nitropaste.  Respiratory effort on bipap has improved.  I discussed her evaluation and treatment with the on-call hospitalist and at his request the on-call intensivist.  Plan admit to step-down.  At this time she does not appear to need intubation.  Will continue to follow closely while she is in the emergency department.  CRITICAL CARE Performed by: Dana Allan T   Total critical care time: 35-45 min  Critical care time was exclusive of separately billable procedures and treating other patients.  Critical care was necessary to treat or prevent imminent or life-threatening deterioration.  Critical care was time spent personally by me on the following activities: development of treatment plan with patient and/or surrogate as well as nursing, discussions with consultants, evaluation of patient's response to treatment, examination of patient, obtaining history from patient or surrogate, ordering and performing treatments and interventions, ordering and review of laboratory studies, ordering and review of radiographic studies, pulse oximetry and re-evaluation of patient's condition.        Tobin Chad, MD 08/15/12 [phone]

## 2012-08-15 NOTE — H&P (Addendum)
[name] is an 78 y.o. female.  Patient was seen and examined on August 15, 2012 at 3:30 AM. PCP - Dr. Evelena Peat. Cardiologist - Dr. Milas Kocher.  Chief Complaint: Shortness of breath. HPI: 78 year-old female with history of recent drug-eluting stent placement for CAD and just discharged 2 days ago after being treated for combined systolic and diastolic heart failure with EF of 35% and at that time patient was found to be in atrial fibrillation was brought to the ER by patient's daughter patient was found to be acutely short of breath. Since discharge patient did fine and she started having nausea and vomiting once he started taking amiodarone. She vomited multiple times with no blood in the vomitus. She did have some epigastric discomfort and after throwing up she did have some chest discomfort. After which she became very short of breath and was brought to the ER. Initial ABG shows a pH of 7.1 and PCO2 of 71. Chest x-ray was showing features of congestion. Patient was initially placed on BiPAP and Lasix 60 mg was given. After a few hours patient's breathing became better and repeat ABG shows substantial improvement. At this time patient denies any chest pain. Denies any fever chills nausea vomiting abdominal pain at this time. Patient will be admitted for further management.   Past Medical History  Diagnosis Date  . Lower extremity edema     bilateral  . CKD (chronic kidney disease), stage III     a. baseline creat 1.6->2  . Labile hypertension   . Anxiety   . Failure to thrive in childhood   . History of shingles July 2004    on the left side of her thorax and postherpetic neuralgia  . History of psoriasis   . History of recurrent UTIs   . Major depressive disorder, single episode, severe 25-May-2003    "when my husband died"  . Valvular heart disease     a. 07/2012 Echo in setting of afib: EF 35%, Mild AI/MR, Mod Dil LA, Mod TR,  . CAD (coronary artery disease)     a. NSTEMI  05/2012 s/p DES to OM 06/07/12 (residual severe LCx subbranch dz for med rx unless refractory angina recurs)  . LBBB (left bundle branch block)     intermittent  . Chronic CHF     a. 05/2012 Echo: EF 60-65%, Gr 1 DD, mild AI/MR, Mod TR;  b. 07/2012 Echo in setting of afib: EF 35%, Mild AI/MR, Mod Dil LA, Mod TR, PASP .  . Atrial fibrillation     a. Dx 07/2012 -> Amio started.  No coumadin 2/2 ongoing DAPT    Past Surgical History  Procedure Date  . Salpingoophorectomy     "cyst removed"  . Appendectomy   . Breast cyst excision   . Abdominal hysterectomy   . Cataract extraction, bilateral ~ 05-24-09  . Cardiac surgery     Family History  Problem Relation Age of Onset  . Diabetes    . Cancer    . Lung cancer     Social History:  reports that she quit smoking about 32 years ago. Her smoking use included Cigarettes. She has a 45 pack-year smoking history. She has never used smokeless tobacco. She reports that she drinks alcohol. She reports that she does not use illicit drugs.  Allergies:  Allergies  Allergen Reactions  . Metoprolol Swelling  . Norvasc (Amlodipine Besylate) Swelling  . Sulfonamide Derivatives Hives, Itching and Swelling  . Statins  Myalgias - "I won't take them"     (Not in a hospital admission)  Results for orders placed during the hospital encounter of 08/14/12 (from the past 48 hour(s))  CBC WITH DIFFERENTIAL     Status: Abnormal   Collection Time   08/15/12 12:06 AM      Component Value Range Comment   WBC 12.1 (*) 4.0 - 10.5 K/uL    RBC 3.89  3.87 - 5.11 MIL/uL    Hemoglobin 11.9 (*) 12.0 - 15.0 g/dL    HCT 47.8 (*) 29.5 - 46.0 %    MCV 87.9  78.0 - 100.0 fL    MCH 30.6  26.0 - 34.0 pg    MCHC 34.8  30.0 - 36.0 g/dL    RDW 62.1  30.8 - 65.7 %    Platelets 360  150 - 400 K/uL DELTA CHECK NOTED   Neutrophils Relative 92 (*) 43 - 77 %    Neutro Abs 11.1 (*) 1.7 - 7.7 K/uL    Lymphocytes Relative 4 (*) 12 - 46 %    Lymphs Abs 0.5 (*) 0.7 - 4.0  K/uL    Monocytes Relative 3  3 - 12 %    Monocytes Absolute 0.4  0.1 - 1.0 K/uL    Eosinophils Relative 0  0 - 5 %    Eosinophils Absolute 0.0  0.0 - 0.7 K/uL    Basophils Relative 0  0 - 1 %    Basophils Absolute 0.0  0.0 - 0.1 K/uL   COMPREHENSIVE METABOLIC PANEL     Status: Abnormal   Collection Time   08/15/12 12:06 AM      Component Value Range Comment   Sodium 129 (*) 135 - 145 mEq/L    Potassium 4.4  3.5 - 5.1 mEq/L    Chloride 91 (*) 96 - 112 mEq/L    CO2 24  19 - 32 mEq/L    Glucose, Bld 157 (*) 70 - 99 mg/dL    BUN 41 (*) 6 - 23 mg/dL    Creatinine, Ser 8.46 (*) 0.50 - 1.10 mg/dL    Calcium 9.9  8.4 - 96.2 mg/dL    Total Protein 7.9  6.0 - 8.3 g/dL    Albumin 3.9  3.5 - 5.2 g/dL    AST 24  0 - 37 U/L    ALT 25  0 - 35 U/L    Alkaline Phosphatase 65  39 - 117 U/L    Total Bilirubin 0.4  0.3 - 1.2 mg/dL    GFR calc non Af Amer 22 (*) >90 mL/min    GFR calc Af Amer 25 (*) >90 mL/min   MAGNESIUM     Status: Normal   Collection Time   08/15/12 12:06 AM      Component Value Range Comment   Magnesium 2.3  1.5 - 2.5 mg/dL   PRO B NATRIURETIC PEPTIDE     Status: Abnormal   Collection Time   08/15/12 12:07 AM      Component Value Range Comment   Pro B Natriuretic peptide (BNP) 9011.0 (*) 0 - 450 pg/mL   TROPONIN I     Status: Normal   Collection Time   08/15/12 12:07 AM      Component Value Range Comment   Troponin I <0.30  <0.30 ng/mL   POCT I-STAT TROPONIN I     Status: Normal   Collection Time   08/15/12 12:33 AM      Component Value  Range Comment   Troponin i, poc 0.04  0.00 - 0.08 ng/mL    Comment 3            POCT I-STAT, CHEM 8     Status: Abnormal   Collection Time   08/15/12 12:35 AM      Component Value Range Comment   Sodium 130 (*) 135 - 145 mEq/L    Potassium 4.4  3.5 - 5.1 mEq/L    Chloride 97  96 - 112 mEq/L    BUN 43 (*) 6 - 23 mg/dL    Creatinine, Ser 1.61 (*) 0.50 - 1.10 mg/dL    Glucose, Bld 096 (*) 70 - 99 mg/dL    Calcium, Ion 0.45  4.09 -  1.30 mmol/L    TCO2 23  0 - 100 mmol/L    Hemoglobin 12.2  12.0 - 15.0 g/dL    HCT 81.1  91.4 - 78.2 %   POCT I-STAT 3, BLOOD GAS (G3+)     Status: Abnormal   Collection Time   08/15/12  1:38 AM      Component Value Range Comment   pH, Arterial 7.171 (*) 7.350 - 7.450    pCO2 arterial 71.2 (*) 35.0 - 45.0 mmHg    pO2, Arterial 89.0  80.0 - 100.0 mmHg    Bicarbonate 26.0 (*) 20.0 - 24.0 mEq/L    TCO2 28  0 - 100 mmol/L    O2 Saturation 94.0      Acid-base deficit 4.0 (*) 0.0 - 2.0 mmol/L    Collection site RADIAL, ALLEN'S TEST ACCEPTABLE      Drawn by RT      Sample type ARTERIAL      Comment NOTIFIED PHYSICIAN     URINALYSIS, ROUTINE W REFLEX MICROSCOPIC     Status: Abnormal   Collection Time   08/15/12  2:40 AM      Component Value Range Comment   Color, Urine YELLOW  YELLOW    APPearance CLOUDY (*) CLEAR    Specific Gravity, Urine 1.015  1.005 - 1.030    pH 5.0  5.0 - 8.0    Glucose, UA NEGATIVE  NEGATIVE mg/dL    Hgb urine dipstick TRACE (*) NEGATIVE    Bilirubin Urine NEGATIVE  NEGATIVE    Ketones, ur NEGATIVE  NEGATIVE mg/dL    Protein, ur 956 (*) NEGATIVE mg/dL    Urobilinogen, UA 0.2  0.0 - 1.0 mg/dL    Nitrite NEGATIVE  NEGATIVE    Leukocytes, UA SMALL (*) NEGATIVE   URINE MICROSCOPIC-ADD ON     Status: Abnormal   Collection Time   08/15/12  2:40 AM      Component Value Range Comment   Squamous Epithelial / LPF RARE  RARE    WBC, UA 11-20  <3 WBC/hpf    RBC / HPF 3-6  <3 RBC/hpf    Bacteria, UA FEW (*) RARE    Casts HYALINE CASTS (*) NEGATIVE   POCT I-STAT 3, BLOOD GAS (G3+)     Status: Abnormal   Collection Time   08/15/12  3:49 AM      Component Value Range Comment   pH, Arterial 7.369  7.350 - 7.450    pCO2 arterial 41.4  35.0 - 45.0 mmHg    pO2, Arterial 101.0 (*) 80.0 - 100.0 mmHg    Bicarbonate 24.0  20.0 - 24.0 mEq/L    TCO2 25  0 - 100 mmol/L    O2 Saturation 98.0  Acid-base deficit 1.0  0.0 - 2.0 mmol/L    Patient temperature 98.0 F       Collection site RADIAL, ALLEN'S TEST ACCEPTABLE      Drawn by RT      Sample type ARTERIAL      Dg Chest 2 View  08/15/2012  *RADIOLOGY REPORT*  Clinical Data: Shortness of breath with nausea vomiting.  CHEST - 2 VIEW  Comparison: 08/10/2012  Findings: Cardiac enlargement persists.  Calcified tortuous aorta. Mild changes consistent with pulmonary edema similar to priors. Bony demineralization.  IMPRESSION: Cardiac enlargement with mild pulmonary edema.  Similar to priors.   Original Report Authenticated By: Elsie Stain, M.D.     Review of Systems  Constitutional: Negative.   HENT: Negative.   Eyes: Negative.   Respiratory: Positive for shortness of breath.   Cardiovascular: Negative.   Gastrointestinal: Positive for nausea and vomiting.  Genitourinary: Negative.   Musculoskeletal: Negative.   Skin: Negative.   Neurological: Negative.   Endo/Heme/Allergies: Negative.   Psychiatric/Behavioral: Negative.     Blood pressure 118/58, pulse 52, temperature 97.6 F (36.4 C), temperature source Oral, resp. rate 17, height 5\' 1"  (1.549 m), weight 56.246 kg (124 lb), SpO2 98.00%. Physical Exam  Constitutional: She is oriented to person, place, and time. She appears well-developed and well-nourished. No distress.  HENT:  Head: Normocephalic and atraumatic.  Right Ear: External ear normal.  Left Ear: External ear normal.  Nose: Nose normal.  Mouth/Throat: Oropharynx is clear and moist. No oropharyngeal exudate.  Eyes: Conjunctivae normal are normal. Pupils are equal, round, and reactive to light. Right eye exhibits no discharge. Left eye exhibits no discharge. No scleral icterus.  Neck: Normal range of motion. Neck supple.  Cardiovascular: Normal rate and regular rhythm.   Respiratory: Effort normal. No respiratory distress. She has no wheezes. She has no rales.  GI: Soft. Bowel sounds are normal. She exhibits no distension. There is no tenderness. There is no rebound.  Musculoskeletal:  Normal range of motion. She exhibits no edema and no tenderness.  Neurological: She is alert and oriented to person, place, and time.       Moves all extremities.  Skin: Skin is warm and dry. She is not diaphoretic.     Assessment/Plan #1. Acute respiratory failure possibly secondary to decompensated CHF - patient she was showing hypercarbic respiratory failure not sure what was causing it. But after BiPAP and Lasix patient's symptoms have improved. At this time patient will be closely monitored in step down unit and continue with Lasix 40 mg IV daily. I have discussed with cardiologist on call Dr. Eliott Nine. We will placing patient on heparin until ACS is rule out. Due to epigastric discomfort will check lipase. #2. CAD status post drug-eluting stent placement - continue with antiplatelet agent. Patient is on heparin IV infusion until ACS is rule out. Presently has no chest pain. #3. Atrial fibrillation presently rate controlled - continue with amiodarone. #4. Possible UTI - check urine culture. Until then patient will be on ceftriaxone. #5. Mild leukocytosis - patient is on ceftriaxone for possible UTI. Lungs do not show any definite signs of infection. Closely follow CBC. #6. Chronic kidney disease - creatinine appears at baseline. Closely follow metabolic panel and intake output. #7. History of hypertension - continue present medications.  Critical care is aware of patient's admission.   CODE STATUS - full code.   Floree Zuniga N. 08/15/2012, 4:03 AM

## 2012-08-15 NOTE — Progress Notes (Signed)
Pt. arrived on unit wearing nasal cannula. Pt. Has been tolerating well. RT spoke with MD Toniann Fail and MD stated ok to leave pt. On nasal cannula. RT will continue to monitor.

## 2012-08-15 NOTE — ED Notes (Signed)
MD at bedside. 

## 2012-08-15 NOTE — Consult Note (Addendum)
CARDIOLOGY CONSULT NOTE  Patient ID: [name], MRN: [id number], DOB/AGE: 01/12/1930 78 y.o. Admit date: 08/14/2012 Date of Consult: 08/15/2012  Primary Physician: [name], MD Primary Cardiologist: Judie Petit. Excell Seltzer, MD  Chief Complaint: nausea, vomiting, sob Reason for Consultation: chest pain, sob  HPI: 78 y.o. female w/ PMHx significant for CAD (NSTEMI 05/2012 s/p DES to OM, residual dz for med rx), Chronic Combined CHF (EF 35% by echo 07/2012), A.Fib (dx 07/2012, on amiodarone, no coumadin 2/2 DAPT), intermittent LBBB, and CKD (Stage III, baseline Crt 1.6-2.0) who presented to [facility] on 08/14/2012 with complaints of nausea, vomiting, chest pain, and sob.  Patient was hospitalized from 9/13-9/16 with acute on chronic combined CHF in the setting of new onset a.fib w/ rvr requiring BiPAP and IV diuresis. She ruled out for MI and was started on Amiodarone. Discharge weight was 129lbs. On day of discharge she was feeling "ok", but wanted to go home to "wash my hair and bathe". Once home she started feeling nauseated shortly after taking amiodarone. She vomited once and then went to bed. On 9/17 she woke up again feeling nauseated and was unable to take medication and only drank gatorade. Later she took her amiodarone and began vomiting "nonstop" with subsequent dyspnea and burning in her chest. She states the chest pain was not similar to the pain she had when she had her NSTEMI and has resolved. Also reports breaking out in cold sweat while vomiting.   In the ED, EKG showed NSR w/ old LBBB. CXR showed cardiomegaly with mild pulmonary edema. Labs were significant for normal cardiac enzymes, BNP 9011-->15,425, Na 129, BUN/Crt 41/2.04, TSH & LFTs WNL.  ABG showed pH 7.1, pCO2 71 for which she was placed on BiPAP and given 60mg  IV lasix with improvement in pH and symptoms. Weight was 124lbs. She was placed on IV heparin, abx for suspected UTI, and admitted by the medicine service for  further evaluation and treatment. She has remained hemodynamically stable. Cardiac enzymes normal x3. Diuresed net (-) 1.4L. Has refused amiodarone today.  Past Medical History  Diagnosis Date  . Lower extremity edema     bilateral  . CKD (chronic kidney disease), stage III     a. baseline creat 1.6->2  . Labile hypertension   . Anxiety   . Failure to thrive in childhood   . History of shingles July 2004    on the left side of her thorax and postherpetic neuralgia  . History of psoriasis   . History of recurrent UTIs   . Major depressive disorder, single episode, severe Apr 13, 2003    "when my husband died"  . Valvular heart disease     a. 07/2012 Echo in setting of afib: EF 35%, Mild AI/MR, Mod Dil LA, Mod TR,  . CAD (coronary artery disease)     a. NSTEMI 05/2012 s/p DES to OM 06/07/12 (residual severe LCx subbranch dz for med rx unless refractory angina recurs)  . LBBB (left bundle branch block)     intermittent  . Chronic CHF     a. 05/2012 Echo: EF 60-65%, Gr 1 DD, mild AI/MR, Mod TR;  b. 07/2012 Echo in setting of afib: EF 35%, Mild AI/MR, Mod Dil LA, Mod TR, PASP .  . Atrial fibrillation     a. Dx 07/2012 -> Amio started.  No coumadin 2/2 ongoing DAPT     08/11/12 - Echo Study Conclusions: - Procedure narrative: Transthoracic echocardiography. Image quality was adequate. The study was  technically difficult. - Left ventricle: The cavity size was normal. The estimated ejection fraction was 35%. - Aortic valve: There was mild stenosis. Mild regurgitation. Valve area: 1.16cm^2(VTI). Valve area: 1.19cm^2 (Vmax). Valve area: 1.24cm^2 (Vmean). - Mitral valve: Mild regurgitation. - Left atrium: The atrium was moderately dilated. - Right atrium: The atrium was mildly dilated. - Tricuspid valve: Moderate regurgitation. - Pulmonary arteries: PA peak pressure: 32mm Hg (S).  06/06/12 - Cardiac Cath Hemodynamics:  AO 161/61  Coronary angiography:  Coronary dominance: right  Left mainstem:  The left mainstem is patent. There is mild irregularity without significant stenosis. The left main is short.  Left anterior descending (LAD): The LAD courses to the left ventricular apex. There is diffuse proximal calcification. The ostium of the LAD has 20-30% stenosis. The proximal vessel has scattered irregularities throughout. The mid vessel has 50% stenosis. The distal vessel as it wraps around the apex is widely patent there were 2 diagonal branches arising from the LAD. The first is an 80% ostial stenosis and this is a very small diagonal. The second diagonal is of moderate caliber and is patent throughout.  Left circumflex (LCx): The left circumflex is tortuous. The vessel is moderately calcified. It supplies a large first obtuse marginal branch that then divides into twin branches. The proximal aspect of the marginal branch has a 95% stenosis present. This lesion is very focal in nature. The AV groove circumflex beyond the marginal branch is small. As the mid AV groove circumflex arises from the marginal there is a 70% ostial stenosis. It then gives off 2 small branch vessels, the first of which has a 95% stenosis in the second of which is widely patent. Both of these vessels are less than 2 mm in diameter.  Right coronary artery (RCA): The right coronary cusp is calcified. The RCA is patent throughout. There are diffuse irregularities and mild calcification. There are no high-grade stenoses present. The vessel gives off a single PDA and a single posterolateral branch.  Left ventriculography: Deferred because of chronic kidney disease  PCI Data:  Vessel -first obtuse marginal/Segment - proximal  Percent Stenosis (pre) 95  TIMI-flow 3  Stent 3.5 x 12 mm drug-eluting  Percent Stenosis (post) 0  TIMI-flow (post) 3  Total contrast equals 125 cc  Final Conclusions:  1. Severe left circumflex stenosis with successful PCI as detailed above  2. Nonobstructive LAD and right coronary artery stenoses    3. Residual severe stenosis in a small subbranch of the left circumflex  Recommendations:  Recommend dual antiplatelet therapy with aspirin and Plavix for 12 months. Medical therapy for her residual CAD unless refractory angina occurs.   Surgical History:  Past Surgical History  Procedure Date  . Salpingoophorectomy     "cyst removed"  . Appendectomy   . Breast cyst excision   . Abdominal hysterectomy   . Cataract extraction, bilateral ~ 2010  . Cardiac surgery      Home Meds: Medication Sig  amiodarone (PACERONE) 200 MG tablet Take 400 mg by mouth daily.  aspirin EC 81 MG tablet Take 1 tablet (81 mg total) by mouth daily.  b complex vitamins tablet Take 1 tablet by mouth daily.    bisoprolol (ZEBETA) 10 MG tablet Take 1 tablet (10 mg total) by mouth daily.  Cholecalciferol (VITAMIN D-3) 5000 UNITS TABS Take 1 tablet by mouth daily.  Cinnamon 500 MG TABS Take 1 tablet by mouth 2 (two) times daily.   clopidogrel (PLAVIX) 75 MG tablet Take 1  tablet (75 mg total) by mouth daily with breakfast.  Cod Liver Oil CAPS Take 1 capsule by mouth daily.  fish oil-omega-3 fatty acids 1000 MG capsule Take 1 g by mouth 2 (two) times daily.   furosemide (LASIX) 40 MG tablet Take 1 tablet (40 mg total) by mouth daily.  HYDROcodone-acetaminophen (VICODIN) 5-500 MG per tablet Take 1 tablet by mouth every 6 (six) hours as needed. For pain  isosorbide mononitrate (IMDUR) 30 MG 24 hr tablet Take 1 tablet (30 mg total) by mouth daily.  Krill Oil 300 MG CAPS Take 1 capsule by mouth daily.   L-Lysine 500 MG CAPS Take 1 capsule by mouth 2 (two) times daily.  nisoldipine (SULAR) 17 MG 24 hr tablet Take 17 mg by mouth daily.  nitroGLYCERIN (NITROSTAT) 0.4 MG SL tablet Place 1 tablet (0.4 mg total) under the tongue every 5 (five) minutes x 3 doses as needed for chest pain.  Oyster Shell (OYSTER CALCIUM) 500 MG TABS Take 500 mg of elemental calcium by mouth daily.  potassium chloride (K-DUR) 10 MEQ tablet  Take 2 tablets (20 mEq total) by mouth daily.  vitamin C (ASCORBIC ACID) 500 MG tablet Take 500 mg by mouth daily.  vitamin E 400 UNIT capsule Take 400 Units by mouth daily.      Inpatient Medications:   . albuterol  2.5 mg Nebulization Once  . albuterol  5 mg Nebulization Once  . amiodarone  400 mg Oral Daily  . aspirin      . aspirin EC  81 mg Oral Daily  . bisoprolol  10 mg Oral Daily  . cefTRIAXone (ROCEPHIN)  IV  1 g Intravenous Q24H  . clopidogrel  75 mg Oral Q breakfast  . feeding supplement  237 mL Oral BID BM  . furosemide  40 mg Intravenous Daily  . furosemide  60 mg Intravenous Once  . heparin  3,000 Units Intravenous Once  . ipratropium  0.5 mg Nebulization Once  . isosorbide mononitrate  30 mg Oral Daily  . nisoldipine  17 mg Oral Daily  . nitroGLYCERIN  1 inch Topical Once  . omega-3 acid ethyl esters  1 g Oral BID  . ondansetron (ZOFRAN) IV  4 mg Intravenous Once  . potassium chloride  20 mEq Oral Daily  . potassium chloride SA       . sodium chloride 10 mL/hr at 08/15/12 1300  . heparin 1,200 Units/hr (08/15/12 1300)    Allergies:  Allergies  Allergen Reactions  . Metoprolol Swelling    Takes bisoprolol without issue  . Norvasc (Amlodipine Besylate) Swelling    Takes nisoldipine without issue  . Sulfonamide Derivatives Hives, Itching and Swelling  . Statins     Myalgias - "I won't take them"    History   Social History  . Marital Status: Widowed    Spouse Name: N/A    Number of Children: N/A  . Years of Education: N/A   Occupational History  . Not on file.   Social History Main Topics  . Smoking status: Former Smoker -- 1.5 packs/day for 30 years    Types: Cigarettes    Quit date: 09/01/1979  . Smokeless tobacco: Never Used  . Alcohol Use: Yes     06/06/12 "mixed drink once or twice a year.to celebrate"  . Drug Use: No  . Sexually Active: No   Other Topics Concern  . Not on file   Social History Narrative   Lives in [place].  Family History  Problem Relation Age of Onset  . Diabetes    . Cancer    . Lung cancer       Review of Systems: General: negative for chills, fever, night sweats or weight changes.  Cardiovascular: (+) chest pain, shortness of breath; negative for dyspnea on exertion, edema, orthopnea, palpitations, or paroxysmal nocturnal dyspnea Dermatological: negative for rash Respiratory: negative for cough or wheezing Urologic: (+) dysuria; negative for hematuria Abdominal: (+) nausea, vomiting; negative for diarrhea, bright red blood per rectum, melena, or hematemesis Neurologic: negative for visual changes, syncope, or dizziness All other systems reviewed and are otherwise negative except as noted above.  Labs:  [facility] 08/15/12 1115 08/15/12 0610 08/15/12 0007  CKTOTAL -- -- 72  CKMB -- -- 5.1*  TROPONINI <0.30 <0.30 <0.30   Lab Results  Component Value Date   WBC 10.3 08/15/2012   HGB 10.3* 08/15/2012   HCT 29.8* 08/15/2012   MCV 87.4 08/15/2012   PLT 291 08/15/2012    Lab 08/15/12 0610  NA 129*  K 3.8  CL 92*  CO2 21  BUN 43*  CREATININE 2.05*  CALCIUM 9.2  PROT 6.8  BILITOT 0.3  ALKPHOS 55  ALT 21  AST 22  GLUCOSE 155*     08/10/2012 11:07 08/15/2012 00:07 08/15/2012 06:10  Pro B Natriuretic peptide (BNP) 13275.0 (H) 9011.0 (H) 15425.0 (H)     08/15/2012 01:38 08/15/2012 03:49  pH, Arterial 7.171 (LL) 7.369  pCO2 arterial 71.2 (HH) 41.4  pO2, Arterial 89.0 101.0 (H)  Bicarbonate 26.0 (H) 24.0  TCO2 28 25  Acid-base deficit 4.0 (H) 1.0  O2 Saturation 94.0 98.0     08/15/2012 06:10  TSH 1.037     08/15/2012 02:40  Color, Urine YELLOW  APPearance CLOUDY (A)  Specific Gravity, Urine 1.015  pH 5.0  Glucose NEGATIVE  Bilirubin Urine NEGATIVE  Ketones, ur NEGATIVE  Protein 100 (A)  Urobilinogen, UA 0.2  Nitrite NEGATIVE  Leukocytes, UA SMALL (A)  Hgb urine dipstick TRACE (A)  WBC, UA 11-20  RBC / HPF 3-6  Squamous Epithelial / LPF RARE  Bacteria, UA FEW (A)   Casts HYALINE CASTS (A)    Radiology/Studies:   08/15/2012 - Chest 2 View Findings: Cardiac enlargement persists.  Calcified tortuous aorta. Mild changes consistent with pulmonary edema similar to priors. Bony demineralization.  IMPRESSION: Cardiac enlargement with mild pulmonary edema.  Similar to priors.     EKG: 08/14/12 @ 2341 - NSR 70bpm, old LBBB  Physical Exam: Blood pressure 112/41, pulse 56, temperature 98.4 F (36.9 C), temperature source Oral, resp. rate 15, height 5\' 1"  (1.549 m), weight 123 lb 14.4 oz (56.2 kg), SpO2 99.00%. General: Frail appearing elderly white female, in no acute distress. Head: Normocephalic, atraumatic, sclera non-icteric, no xanthomas, nares are without discharge.  Neck: Supple. Negative for carotid bruits. (+) JVD. Lungs: Bibasilar rales. No wheezes or rhonchi. Breathing is unlabored. Heart: RRR with S1 S2. 2/6 systolic murmur RUSB with radiation to carotids. No rubs or gallops appreciated. Abdomen: Soft, epigastric tenderness, non-distended with normoactive bowel sounds.  No rebound/guarding. No obvious abdominal masses. Msk:  Strength and tone appear normal for age. Extremities: No clubbing or cyanosis. No edema.  Distal pedal pulses are intact and equal bilaterally. Neuro: Alert and oriented X 3. Moves all extremities spontaneously. Psych:  Responds to questions appropriately with a normal affect.   Assessment and Plan:  78 y.o. female w/ PMHx significant for CAD (NSTEMI 05/2012 s/p DES to OM,  residual dz for med rx), Chronic Combined CHF (EF 35% by echo 07/2012), A.Fib (dx 07/2012, on amiodarone, no coumadin 2/2 DAPT), intermittent LBBB, and CKD (Stage III, baseline Crt 1.6-2.0) who presented to [facility] on 08/14/2012 with complaints of nausea, vomiting, chest pain, and sob.  1. Acute Respiratory Failure 2. Acute on Chronic Combined Systolic and Diastolic CHF 3. Nausea/Vomiting 4. Chest pain 5. Atrial Fibrillation-paroxysmal 6. Coronary  Artery Disease 7. LBBB-rate related  \8. Hyponatremia 9. ?UTI 10. Chronic Kidney Disease Stage III 11. Normocytic Anemia 12. CHADS-VASc score of 6; age-83, gender-1, vascular disease-1, hypertension-1, congestive heart failure-1     Signed, HOPE, JESSICA PA-C 08/15/2012, 1:34 PM  Patient presents with dyspnea in the context of nausea and vomiting likely secondary to amiodarone. The objective measurement of a PCO2 of 70 and a pH of 71 suggest acute hypoventilation the mechanism which I don't understand. Could be a manifestation of heart failure probably mixed with her most recent ejection fraction of 35% although in July was 60%. This former number occurred in the context of atrial fibrillation which is modestly rapid and associated with rate related bundle-branch block.  The challenge in the diagnosis of congestive heart failure is that her BUN and creatinine ratio 1 climbing without explanation for the elevated BUN. She has been diuresis feeling better however. Furthermore her BNP had gone from 9K-15 K.  As relates to atrial fibrillation options, rhythm control is not a possibility. Her coronary disease includes a 1C agent, her QT with a narrow QRS in July was 450 ms precluding either sotalol or dofetilide. Hence we are left with rate control as the option. In the event that her left ventricular function does not improve, consideration could be given to CRT with AV junction ablation.  For now, #1 we'll discuss with Dr. Excell Seltzer changing her anti-platelet therapy to a single agent and adding Coumadin for atrial fibrillation and the possibility of pulmonary embolism   #2-guaiac stool  #3 gentle diuresis  #4-would discontinue heparin I don't think that this represents an acute coronary syndrome although for stent thrombosis in the marginal system might be electrocardiographic silent in the absence of using V7-V9 electrodes but in the presence of her left bundle branch block that would not be helpful  but also we would expect her enzymes to be abnormal. #5-discontinue amiodarone

## 2012-08-15 NOTE — ED Notes (Signed)
Per the patient's daughter, Grandville Silos, the patient does not want to be placed on a ventilator if she is brain dead, but she is willing to placed on a ventilator to assist with her breathing while she is still alive.

## 2012-08-15 NOTE — Progress Notes (Signed)
This is the addendum to earlier H&P done by Dr. Toniann Fail. Pt seen and examined at bedside and doing well. Pt denies shortness of breath or chest pain currently but does endorse more discomfort with even minimal exertion. I have reviewed blood work and morning tests are still pending. Will keep following and will also continue to keep pt in SDU. Cardiology was consulted and heparin to continue until ACS ruled out.   Debbora Presto, MD  [facility] Pager [phone]  If 7PM-7AM, please contact night-coverage [email or web address] Password [email or web address]

## 2012-08-16 DIAGNOSIS — R079 Chest pain, unspecified: Secondary | ICD-10-CM

## 2012-08-16 DIAGNOSIS — N184 Chronic kidney disease, stage 4 (severe): Secondary | ICD-10-CM

## 2012-08-16 DIAGNOSIS — I4891 Unspecified atrial fibrillation: Secondary | ICD-10-CM

## 2012-08-16 DIAGNOSIS — I509 Heart failure, unspecified: Secondary | ICD-10-CM

## 2012-08-16 DIAGNOSIS — I2510 Atherosclerotic heart disease of native coronary artery without angina pectoris: Secondary | ICD-10-CM

## 2012-08-16 DIAGNOSIS — R0989 Other specified symptoms and signs involving the circulatory and respiratory systems: Secondary | ICD-10-CM

## 2012-08-16 LAB — BASIC METABOLIC PANEL
CO2: 28 mEq/L (ref 19–32)
Calcium: 9.2 mg/dL (ref 8.4–10.5)
Glucose, Bld: 106 mg/dL — ABNORMAL HIGH (ref 70–99)
Sodium: 129 mEq/L — ABNORMAL LOW (ref 135–145)

## 2012-08-16 LAB — CBC
Hemoglobin: 9.9 g/dL — ABNORMAL LOW (ref 12.0–15.0)
MCH: 30.3 pg (ref 26.0–34.0)
RBC: 3.27 MIL/uL — ABNORMAL LOW (ref 3.87–5.11)

## 2012-08-16 MED ORDER — AMOXICILLIN 500 MG PO CAPS
500.0000 mg | ORAL_CAPSULE | Freq: Three times a day (TID) | ORAL | Status: DC
  Administered 2012-08-16 – 2012-08-18 (×6): 500 mg via ORAL
  Filled 2012-08-16 (×9): qty 1

## 2012-08-16 NOTE — Progress Notes (Signed)
Pharmacist Heart Failure Core Measure Documentation  Assessment: [name] has an EF documented as 35% on 08/11/12 by Echo.  Rationale: Heart failure patients with left ventricular systolic dysfunction (LVSD) and an EF < 40% should be prescribed an angiotensin converting enzyme inhibitor (ACEI) or angiotensin receptor blocker (ARB) at discharge unless a contraindication is documented in the medical record.  This patient is not currently on an ACEI or ARB for HF.  This note is being placed in the record in order to provide documentation that a contraindication to the use of these agents is present for this encounter.  ACE Inhibitor or Angiotensin Receptor Blocker is contraindicated (specify all that apply)  []   ACEI allergy AND ARB allergy []   Angioedema []   Moderate or severe aortic stenosis []   Hyperkalemia []   Hypotension []   Renal artery stenosis [x]   Worsening renal function, preexisting renal disease or dysfunction  Harland German, Pharm D 08/16/2012 11:11 AM

## 2012-08-16 NOTE — Progress Notes (Addendum)
SUBJECTIVE: Feels better. Mild nausea. No CP.   BP 121/44  Pulse 53  Temp 98.5 F (36.9 C) (Oral)  Resp 16  Ht 5\' 1"  (1.549 m)  Wt 124 lb 5.4 oz (56.4 kg)  BMI 23.49 kg/m2  SpO2 93%  Intake/Output Summary (Last 24 hours) at 08/16/12 0705 Last data filed at 08/16/12 0100  Gross per 24 hour  Intake    224 ml  Output   2205 ml  Net  -1981 ml    PHYSICAL EXAM General: Well developed, well nourished, in no acute distress. Alert and oriented x 3.  Psych:  Good affect, responds appropriately Neck: No JVD. No masses noted.  Lungs: Clear bilaterally with no wheezes or rhonci noted.  Heart: RRR with soft systolic murmur noted. Abdomen: Bowel sounds are present. Soft, non-tender.  Extremities: No lower extremity edema.   LABS: Basic Metabolic Panel:  Basename 08/16/12 0430 08/15/12 0610 08/15/12 0006  NA 129* 129* --  K 4.5 3.8 --  CL 93* 92* --  CO2 28 21 --  GLUCOSE 106* 155* --  BUN 52* 43* --  CREATININE 2.53* 2.05* --  CALCIUM 9.2 9.2 --  MG -- -- 2.3  PHOS -- -- --   CBC:  Basename 08/16/12 0430 08/15/12 0610 08/15/12 0006  WBC 10.6* 10.3 --  NEUTROABS -- 8.9* 11.1*  HGB 9.9* 10.3* --  HCT 28.8* 29.8* --  MCV 88.1 87.4 --  PLT 281 291 --   Cardiac Enzymes:  Basename 08/15/12 1741 08/15/12 1115 08/15/12 0610 08/15/12 0007  CKTOTAL -- -- -- 72  CKMB -- -- -- 5.1*  CKMBINDEX -- -- -- --  TROPONINI <0.30 <0.30 <0.30 --   Current Meds:    . aspirin      . aspirin EC  81 mg Oral Daily  . bisoprolol  10 mg Oral Daily  . cefTRIAXone (ROCEPHIN)  IV  1 g Intravenous Q24H  . clopidogrel  75 mg Oral Q breakfast  . feeding supplement  237 mL Oral BID BM  . furosemide  40 mg Intravenous Daily  . isosorbide mononitrate  30 mg Oral Daily  . nisoldipine  17 mg Oral Daily  . omega-3 acid ethyl esters  1 g Oral BID  . potassium chloride  20 mEq Oral Daily  . potassium chloride SA      . sodium chloride  3 mL Intravenous Q12H  . sodium chloride  3 mL  Intravenous Q12H  . DISCONTD: amiodarone  400 mg Oral Daily     ASSESSMENT AND PLAN: 78 yo female with history of CAD (NSTEMI 05/2012 s/p DES to OM, residual dz for med rx), Chronic Combined CHF (EF 35% by echo 07/2012), A.Fib (dx 07/2012, on amiodarone, no coumadin 2/2 DAPT), intermittent LBBB, and CKD (Stage III, baseline Crt 1.6-2.0) who presented to [facility] on 08/14/2012 with complaints of nausea, vomiting, chest pain, and sob.   1. Acute on chronic combined CHF: LVEF 35%. Weight is less than discharge weight. Does not appear volume overloaded. Would hold Lasix today with worsening renal function.   2. Atrial fibrillation: She is in sinus today. Amiodarone has been stopped as it was felt by Dr. Graciela Husbands to be a potential reason for her nausea. Continue beta blocker. Will discuss the option of adding coumadin and holding ASA while continuing Plavix with recent DES placement.   3. Cardiomyopathy: ?tachycardia induced. Out of proportion to level of CAD.  Not on ace inhibitor due to renal  failure. Continue beta blocker.   4. Chronic kidney disease, stage 4: Hold Lasix today. Likely pre-renal with vomiting, poor po intake.   5. CAD: Cardiac markers negative. s/p NSTEMI 7/13 with  DES to OM. If her nausea/CP  were due to stent thrombosis, she would have had a change in her markers. Continue dual antiplatelet therapy for now.   6. LBBB: chronic  7: Nausea: ? Secondary to amiodarone which has been stopped. Possibly secondary to UTI.   8. Dispo: I think she could go out to telemetry today.     MCALHANY,CHRISTOPHER  9/19/20137:05 AM

## 2012-08-16 NOTE — Progress Notes (Signed)
[facility] PROGRESS NOTE  [name] [place] ZOX:[id number] DOB: [birth year] DOA: 08/14/2012 PCP: [name], MD  Assessment/Plan: Principal Problem:  *Acute respiratory failure Suspect possible aspiration from explosive vomiting with a component of heart failure. Cardiology has held Lasix appropriately today. Will repeat CXR tomorrow to f/u on infiltrates.   Active Problems:  Atrial fibrillation Patient suspects that Amiodarone started her symptoms of vomiting. Currently she is in NSR and on a B blocker. Cardiology managing.    Acute on chronic combined systolic and diastolic CHF, NYHA class 3 As above, Lasix held today.    CAD (coronary artery disease) Management per cardiology.   Acute on Chronic CKD 4 Lasix held for today.   Hyponatremia Likely from dehydration- Na has been stable at 129.   UTI Coag negative staph- cont Rocephin-no mention if this is Methicillin resistant- await sensitivities - I have spoken [facility] lab and they have assured me that sensitivities will result tomorrow.    Code Status: full code Family Communication: none Disposition Plan: transfer to telemetry DVT prophylaxis: ASA and Plavix   Brief narrative: 78 year-old female with history of recent drug-eluting stent placement for CAD and just discharged 2 days ago after being treated for combined systolic and diastolic heart failure with EF of 35% and at that time patient was found to be in atrial fibrillation was brought to the ER by patient's daughter patient was found to be acutely short of breath. Since discharge patient did fine and she started having nausea and vomiting once he started taking amiodarone. She vomited multiple times with no blood in the vomitus. She did have some epigastric discomfort and after throwing up she did have some chest discomfort. After which she became very short of breath and was brought to the ER. Initial ABG shows a pH of 7.1 and PCO2 of 71. Chest x-ray was  showing features of congestion. Patient was initially placed on BiPAP and Lasix 60 mg was given. After a few hours patient's breathing became better and repeat ABG shows substantial improvement. At this time patient denies any chest pain. Denies any fever chills nausea vomiting abdominal pain at this time. Patient will be admitted for further management.    Consultants:  [facility]  Procedures:  none  Antibiotics: Rocephin  HPI/Subjective: Pt alert, dyspnea resolved. No chest pain. No new cough.   Objective: Filed Vitals:   08/16/12 0900 08/16/12 1000 08/16/12 1100 08/16/12 1206  BP: 140/53 138/55 128/47 137/51  Pulse: 57 59 55 63  Temp:    97.8 F (36.6 C)  TempSrc:    Oral  Resp: 16 18 20 20   Height:    5\' 1"  (1.549 m)  Weight:    57.8 kg (127 lb 6.8 oz)  SpO2: 94% 94% 95% 92%    Intake/Output Summary (Last 24 hours) at 08/16/12 1319 Last data filed at 08/16/12 1100  Gross per 24 hour  Intake    688 ml  Output   1855 ml  Net  -1167 ml    Exam:   General:  Awake alert, oriented and in no acute distress  Cardiovascular: RRR, no murmur  Respiratory: mild crackles in RLL, no wheeze or ronchi  Abdomen: soft, NT, ND, BS+  Ext: no c/c/e  Data Reviewed: Basic Metabolic Panel:  Lab 08/16/12 8119 08/15/12 0610 08/15/12 0035 08/15/12 0006 08/13/12 0530 08/12/12 0640  NA 129* 129* 130* 129* 131* --  K 4.5 3.8 4.4 4.4 3.0* --  CL 93* 92* 97 91* 94* --  CO2 28 21 -- 24 23 27   GLUCOSE 106* 155* 155* 157* 95 --  BUN 52* 43* 43* 41* 34* --  CREATININE 2.53* 2.05* 2.00* 2.04* 1.97* --  CALCIUM 9.2 9.2 -- 9.9 8.8 8.7  MG -- -- -- 2.3 -- --  PHOS -- -- -- -- -- --   Liver Function Tests:  Lab 08/15/12 0610 08/15/12 0006  AST 22 24  ALT 21 25  ALKPHOS 55 65  BILITOT 0.3 0.4  PROT 6.8 7.9  ALBUMIN 3.4* 3.9    Lab 08/15/12 0610  LIPASE 53  AMYLASE --   No results found for this basename: AMMONIA:5 in the last 168 hours CBC:  Lab 08/16/12 0430  08/15/12 0610 08/15/12 0035 08/15/12 0006 08/13/12 0530 08/12/12 0640 08/10/12 1107  WBC 10.6* 10.3 -- 12.1* 9.4 10.4 --  NEUTROABS -- 8.9* -- 11.1* -- -- 10.1*  HGB 9.9* 10.3* 12.2 11.9* 11.1* -- --  HCT 28.8* 29.8* 36.0 34.2* 32.4* -- --  MCV 88.1 87.4 -- 87.9 87.8 88.0 --  PLT 281 291 -- 360 246 263 --   Cardiac Enzymes:  Lab 08/15/12 1741 08/15/12 1115 08/15/12 0610 08/15/12 0007 08/11/12 0848 08/11/12 0300 08/10/12 2138 08/10/12 1606  CKTOTAL -- -- -- 72 -- 64 68 76  CKMB -- -- -- 5.1* -- 3.7 3.9 3.9  CKMBINDEX -- -- -- -- -- -- -- --  TROPONINI <0.30 <0.30 <0.30 <0.30 <0.30 -- -- --   BNP (last 3 results)  Basename 08/15/12 0610 08/15/12 0007 08/10/12 1107  PROBNP 15425.0* 9011.0* 13275.0*   CBG: No results found for this basename: GLUCAP:5 in the last 168 hours  Recent Results (from the past 240 hour(s))  URINE CULTURE     Status: Normal   Collection Time   08/10/12  3:39 PM      Component Value Range Status Comment   Specimen Description URINE, CLEAN CATCH   Final    Special Requests NONE   Final    Culture  Setup Time 08/11/2012 19:01   Final    Colony Count 7,000 COLONIES/ML   Final    Culture INSIGNIFICANT GROWTH   Final    Report Status 08/13/2012 FINAL   Final   MRSA PCR SCREENING     Status: Normal   Collection Time   08/10/12  7:51 PM      Component Value Range Status Comment   MRSA by PCR NEGATIVE  NEGATIVE Final   URINE CULTURE     Status: Normal (Preliminary result)   Collection Time   08/15/12  2:40 AM      Component Value Range Status Comment   Specimen Description URINE, CATHETERIZED   Final    Special Requests CX ADDED AT 0630   Final    Culture  Setup Time 08/15/2012 12:18   Final    Colony Count >=100,000 COLONIES/ML   Final    Culture     Final    Value: STAPHYLOCOCCUS SPECIES (COAGULASE NEGATIVE)     Note: RIFAMPIN AND GENTAMICIN SHOULD NOT BE USED AS SINGLE DRUGS FOR TREATMENT OF STAPH INFECTIONS.   Report Status PENDING   Incomplete       Studies: Dg Chest 2 View  08/15/2012  *RADIOLOGY REPORT*  Clinical Data: Shortness of breath with nausea vomiting.  CHEST - 2 VIEW  Comparison: 08/10/2012  Findings: Cardiac enlargement persists.  Calcified tortuous aorta. Mild changes consistent with pulmonary edema similar to priors. Bony demineralization.  IMPRESSION: Cardiac enlargement with mild  pulmonary edema.  Similar to priors.   Original Report Authenticated By: Elsie Stain, M.D.    Dg Chest Portable 1 View  08/10/2012  *RADIOLOGY REPORT*  Clinical Data: Shortness of breath, fatigue  PORTABLE CHEST - 1 VIEW  Comparison: 08/10/2012  Findings: Moderate to marked cardiomegaly noted with new interstitial Kerley B lines and trace pleural effusions.  No focal pulmonary opacity.  No pneumothorax.  No acute osseous finding. Aorta is ectatic and unfolded.   Diffusely coarsened interstitial markings compatible with previously reported chronic lung disease.  IMPRESSION: Interstitial Kerley B lines and trace pleural fluid may indicate pulmonary edema, superimposed on underlying chronic lung disease.   Original Report Authenticated By: Harrel Lemon, M.D.    Dg Chest Port 1 View  08/10/2012  *RADIOLOGY REPORT*  Clinical Data: Shortness of breath.  PORTABLE CHEST - 1 VIEW  Comparison: 06/06/2012 and 02/14/2012.  Findings: 1210 hours.  Cardiomegaly and aortic tortuosity appear stable.  There is stable biapical pleural parenchymal scarring.  No superimposed airspace disease, edema or pleural effusion is seen. Osseous structures appear unchanged.  Telemetry leads overlie the chest.  IMPRESSION: Stable cardiomegaly and chronic biapical lung disease.  No acute cardiopulmonary process.   Original Report Authenticated By: Gerrianne Scale, M.D.     Scheduled Meds:   . aspirin EC  81 mg Oral Daily  . bisoprolol  10 mg Oral Daily  . cefTRIAXone (ROCEPHIN)  IV  1 g Intravenous Q24H  . clopidogrel  75 mg Oral Q breakfast  . feeding supplement  237 mL  Oral BID BM  . isosorbide mononitrate  30 mg Oral Daily  . nisoldipine  17 mg Oral Daily  . omega-3 acid ethyl esters  1 g Oral BID  . potassium chloride  20 mEq Oral Daily  . sodium chloride  3 mL Intravenous Q12H  . sodium chloride  3 mL Intravenous Q12H  . DISCONTD: amiodarone  400 mg Oral Daily  . DISCONTD: furosemide  40 mg Intravenous Daily   Continuous Infusions:   . sodium chloride Stopped (08/15/12 1700)  . DISCONTD: heparin Stopped (08/15/12 1700)    ________________________________________________________________________  Time spent: 30 min    [facility]  [facility] Pager [phone] If 8PM-8AM, please contact night-coverage at [email or web address], password [facility] 08/16/2012, 1:19 PM  LOS: 2 days

## 2012-08-17 ENCOUNTER — Inpatient Hospital Stay (HOSPITAL_COMMUNITY): Payer: Medicare Other

## 2012-08-17 ENCOUNTER — Other Ambulatory Visit: Payer: Medicare Other

## 2012-08-17 ENCOUNTER — Encounter: Payer: Medicare Other | Admitting: Physician Assistant

## 2012-08-17 ENCOUNTER — Encounter (HOSPITAL_COMMUNITY): Payer: Self-pay | Admitting: Nurse Practitioner

## 2012-08-17 LAB — BASIC METABOLIC PANEL
BUN: 43 mg/dL — ABNORMAL HIGH (ref 6–23)
CO2: 32 mEq/L (ref 19–32)
Chloride: 93 mEq/L — ABNORMAL LOW (ref 96–112)
GFR calc non Af Amer: 22 mL/min — ABNORMAL LOW (ref >90)
Glucose, Bld: 132 mg/dL — ABNORMAL HIGH (ref 70–99)
Potassium: 4.6 mEq/L (ref 3.5–5.1)

## 2012-08-17 NOTE — Progress Notes (Signed)
Subjective: Feeling better, breathing better. No specific concerns. Feeling weak.  Objective: Vital signs in last 24 hours: Filed Vitals:   08/16/12 1206 08/16/12 2053 08/17/12 0149 08/17/12 0535  BP: 137/51 134/48 146/65 155/62  Pulse: 63 55 58 57  Temp: 97.8 F (36.6 C) 98.3 F (36.8 C) 98.8 F (37.1 C) 98.5 F (36.9 C)  TempSrc: Oral Oral Oral Oral  Resp: 20 18 16 18   Height: 5\' 1"  (1.549 m)     Weight: 57.8 kg (127 lb 6.8 oz)   57.4 kg (126 lb 8.7 oz)  SpO2: 92% 94% 94% 94%   Weight change: 1.4 kg (3 lb 1.4 oz)  Intake/Output Summary (Last 24 hours) at 08/17/12 1007 Last data filed at 08/17/12 0920  Gross per 24 hour  Intake    480 ml  Output    200 ml  Net    280 ml    Physical Exam: General: Awake, Oriented, No acute distress. HEENT: EOMI. Neck: Supple CV: S1 and S2 Lungs: Clear to ascultation bilaterally Abdomen: Soft, Nontender, Nondistended, +bowel sounds. Ext: Good pulses. Trace edema.  Lab Results: Basic Metabolic Panel:  Lab 08/16/12 6962 08/15/12 0610 08/15/12 0035 08/15/12 0006 08/13/12 0530 08/12/12 0640  NA 129* 129* 130* 129* 131* --  K 4.5 3.8 4.4 4.4 3.0* --  CL 93* 92* 97 91* 94* --  CO2 28 21 -- 24 23 27   GLUCOSE 106* 155* 155* 157* 95 --  BUN 52* 43* 43* 41* 34* --  CREATININE 2.53* 2.05* 2.00* 2.04* 1.97* --  CALCIUM 9.2 9.2 -- 9.9 8.8 8.7  MG -- -- -- 2.3 -- --  PHOS -- -- -- -- -- --   Liver Function Tests:  Lab 08/15/12 0610 08/15/12 0006  AST 22 24  ALT 21 25  ALKPHOS 55 65  BILITOT 0.3 0.4  PROT 6.8 7.9  ALBUMIN 3.4* 3.9    Lab 08/15/12 0610  LIPASE 53  AMYLASE --   No results found for this basename: AMMONIA:5 in the last 168 hours CBC:  Lab 08/16/12 0430 08/15/12 0610 08/15/12 0035 08/15/12 0006 08/13/12 0530 08/12/12 0640 08/10/12 1107  WBC 10.6* 10.3 -- 12.1* 9.4 10.4 --  NEUTROABS -- 8.9* -- 11.1* -- -- 10.1*  HGB 9.9* 10.3* 12.2 11.9* 11.1* -- --  HCT 28.8* 29.8* 36.0 34.2* 32.4* -- --  MCV 88.1 87.4 --  87.9 87.8 88.0 --  PLT 281 291 -- 360 246 263 --   Cardiac Enzymes:  Lab 08/15/12 1741 08/15/12 1115 08/15/12 0610 08/15/12 0007 08/11/12 0848 08/11/12 0300 08/10/12 2138 08/10/12 1606  CKTOTAL -- -- -- 72 -- 64 68 76  CKMB -- -- -- 5.1* -- 3.7 3.9 3.9  CKMBINDEX -- -- -- -- -- -- -- --  TROPONINI <0.30 <0.30 <0.30 <0.30 <0.30 -- -- --   BNP (last 3 results)  Basename 08/15/12 0610 08/15/12 0007 08/10/12 1107  PROBNP 15425.0* 9011.0* 13275.0*   CBG: No results found for this basename: GLUCAP:5 in the last 168 hours No results found for this basename: HGBA1C:5 in the last 72 hours Other Labs: No components found with this basename: POCBNP:3  Lab 08/10/12 1107  DDIMER 0.91*   No results found for this basename: CHOL:2,HDL:2,LDLCALC:2,TRIG:2,CHOLHDL:2,LDLDIRECT:2 in the last 168 hours  Lab 08/15/12 0610  TSH 1.037  T4TOTAL --  T3FREE --  FREET4 --  THYROIDAB --    Lab 08/15/12 1740  VITAMINB12 364  FOLATE 9.9  FERRITIN 66  TIBC 344  IRON 34*  RETICCTPCT 1.7    Micro Results: Recent Results (from the past 240 hour(s))  URINE CULTURE     Status: Normal   Collection Time   08/10/12  3:39 PM      Component Value Range Status Comment   Specimen Description URINE, CLEAN CATCH   Final    Special Requests NONE   Final    Culture  Setup Time 08/11/2012 19:01   Final    Colony Count 7,000 COLONIES/ML   Final    Culture INSIGNIFICANT GROWTH   Final    Report Status 08/13/2012 FINAL   Final   MRSA PCR SCREENING     Status: Normal   Collection Time   08/10/12  7:51 PM      Component Value Range Status Comment   MRSA by PCR NEGATIVE  NEGATIVE Final   URINE CULTURE     Status: Normal (Preliminary result)   Collection Time   08/15/12  2:40 AM      Component Value Range Status Comment   Specimen Description URINE, CATHETERIZED   Final    Special Requests CX ADDED AT 0630   Final    Culture  Setup Time 08/15/2012 12:18   Final    Colony Count >=100,000 COLONIES/ML   Final     Culture     Final    Value: STAPHYLOCOCCUS SPECIES (COAGULASE NEGATIVE)     Note: RIFAMPIN AND GENTAMICIN SHOULD NOT BE USED AS SINGLE DRUGS FOR TREATMENT OF STAPH INFECTIONS.   Report Status PENDING   Incomplete     Studies/Results: Dg Chest Port 1 View  08/17/2012  *RADIOLOGY REPORT*  Clinical Data: Cough, nasal congestion  PORTABLE CHEST - 1 VIEW  Comparison: Chest radiograph 08/15/2012  Findings: Stable enlarged heart silhouette.  There is left lower lobe atelectasis versus infiltrate.  Lungs are hyperinflated.  No pulmonary edema.  No pneumothorax.  IMPRESSION:  1.  Left lower lobe atelectasis versus infiltrate. 2.  Emphysematous change.   Original Report Authenticated By: Genevive Bi, M.D.     Medications: I have reviewed the patient's current medications. Scheduled Meds:   . amoxicillin  500 mg Oral Q8H  . aspirin EC  81 mg Oral Daily  . bisoprolol  10 mg Oral Daily  . clopidogrel  75 mg Oral Q breakfast  . feeding supplement  237 mL Oral BID BM  . isosorbide mononitrate  30 mg Oral Daily  . nisoldipine  17 mg Oral Daily  . omega-3 acid ethyl esters  1 g Oral BID  . potassium chloride  20 mEq Oral Daily  . sodium chloride  3 mL Intravenous Q12H  . sodium chloride  3 mL Intravenous Q12H  . DISCONTD: cefTRIAXone (ROCEPHIN)  IV  1 g Intravenous Q24H   Continuous Infusions:   . sodium chloride Stopped (08/15/12 1700)   PRN Meds:.acetaminophen, acetaminophen, ALPRAZolam, HYDROcodone-acetaminophen, nitroGLYCERIN, ondansetron (ZOFRAN) IV, ondansetron  Assessment/Plan: Acute respiratory failure  Suspect possible aspiration from explosive vomiting with a component of heart failure. Currently on amoxicillin, antibiotics since 08/15/2012. Cardiology has continued to hold Lasix, renal function improved. held Lasix appropriately today. Repeat CXR today shows LLL atelectasis versys infiltrate on 08/17/2012.   Atrial fibrillation  Patient suspects that Amiodarone started her  symptoms of vomiting. Currently she is in NSR and on a B blocker (bisoprolol). Cardiology managing.   Acute on chronic combined systolic and diastolic CHF, NYHA class 3  As above, Lasix held today.   CAD (coronary artery disease)  Management per cardiology.  Acute renal failure on Chronic CKD 4  Lasix held for today. Renal function has improved.   Hyponatremia  Likely from dehydration, Na improved to 133 today, continue to monitor.   UTI  Coag negative staph, currently on amoxicillin, sensitives pending.   Generalized weakness PT/OT evaluation requested.   Code Status: Full code  Family Communication: Discussed with patient. Disposition Plan: Continue in telemetry, DC in 1-2 days? per cardiology. DVT prophylaxis: SCDs.    LOS: 3 days  Abiel Antrim A, MD 08/17/2012, 10:07 AM

## 2012-08-17 NOTE — Progress Notes (Signed)
Patient Name: [name] Date of Encounter: 08/17/2012    Principal Problem:  *Acute respiratory failure Active Problems:  Hyperlipidemia  Atrial fibrillation  Acute on chronic combined systolic and diastolic CHF, NYHA class 3  CKD (chronic kidney disease), stage IV  CAD (coronary artery disease)   SUBJECTIVE  Feels good.  No chest pain, sob, nausea, vomiting.  Maintaining sinus.  CURRENT MEDS    . amoxicillin  500 mg Oral Q8H  . aspirin EC  81 mg Oral Daily  . bisoprolol  10 mg Oral Daily  . clopidogrel  75 mg Oral Q breakfast  . feeding supplement  237 mL Oral BID BM  . isosorbide mononitrate  30 mg Oral Daily  . nisoldipine  17 mg Oral Daily  . omega-3 acid ethyl esters  1 g Oral BID  . potassium chloride  20 mEq Oral Daily  . sodium chloride  3 mL Intravenous Q12H  . sodium chloride  3 mL Intravenous Q12H    OBJECTIVE  Filed Vitals:   08/16/12 2053 08/17/12 0149 08/17/12 0535 08/17/12 1334  BP: 134/48 146/65 155/62 107/50  Pulse: 55 58 57 52  Temp: 98.3 F (36.8 C) 98.8 F (37.1 C) 98.5 F (36.9 C) 98.5 F (36.9 C)  TempSrc: Oral Oral Oral Oral  Resp: 18 16 18 18   Height:      Weight:   126 lb 8.7 oz (57.4 kg)   SpO2: 94% 94% 94% 95%    Intake/Output Summary (Last 24 hours) at 08/17/12 1356 Last data filed at 08/17/12 1315  Gross per 24 hour  Intake    360 ml  Output    400 ml  Net    -40 ml   Filed Weights   08/16/12 0531 08/16/12 1206 08/17/12 0535  Weight: 124 lb 5.4 oz (56.4 kg) 127 lb 6.8 oz (57.8 kg) 126 lb 8.7 oz (57.4 kg)    PHYSICAL EXAM  General: Pleasant, NAD. Neuro: Alert and oriented X 3. Moves all extremities spontaneously. Psych: Normal affect. HEENT:  Normal  Neck: Supple without JVD.  R bruit vs radiated murmur Lungs:  Resp regular and unlabored, diminished @ bases, R>L with fine crackles. Heart: RRR no s3, s4.  2/6 sem rusb->apex. Abdomen: Soft, non-tender, non-distended, BS + x 4.  Extremities: No clubbing,  cyanosis or edema. DP/PT/Radials 1+ and equal bilaterally.  Accessory Clinical Findings  CBC  Basename 08/16/12 0430 08/15/12 0610 08/15/12 0006  WBC 10.6* 10.3 --  NEUTROABS -- 8.9* 11.1*  HGB 9.9* 10.3* --  HCT 28.8* 29.8* --  MCV 88.1 87.4 --  PLT 281 291 --   Basic Metabolic Panel  Basename 08/17/12 1038 08/16/12 0430 08/15/12 0006  NA 133* 129* --  K 4.6 4.5 --  CL 93* 93* --  CO2 32 28 --  GLUCOSE 132* 106* --  BUN 43* 52* --  CREATININE 2.01* 2.53* --  CALCIUM 9.6 9.2 --  MG -- -- 2.3  PHOS -- -- --   Liver Function Tests  Basename 08/15/12 0610 08/15/12 0006  AST 22 24  ALT 21 25  ALKPHOS 55 65  BILITOT 0.3 0.4  PROT 6.8 7.9  ALBUMIN 3.4* 3.9    Basename 08/15/12 0610  LIPASE 53  AMYLASE --   Cardiac Enzymes  Basename 08/15/12 1741 08/15/12 1115 08/15/12 0610 08/15/12 0007  CKTOTAL -- -- -- 72  CKMB -- -- -- 5.1*  CKMBINDEX -- -- -- --  TROPONINI <0.30 <0.30 <0.30 --  Thyroid Function Tests  Basename 08/15/12 0610  TSH 1.037  T4TOTAL --  T3FREE --  THYROIDAB --    TELE  Sinus brady  ASSESSMENT AND PLAN  1.  Acute respiratory failure:  Multifactorial - chf/? Aspiration. Feeling much better.  Volume stable.  CXR pending.  Lasix on hold - Abx per IM.  2.  Acute on chronic combined systolic/diastolic CHF:  As above, feeling better.  Weight stable @ 126 - still below prior d/c weight of 129.  F/u bmet this AM - lasix remains on hold.  Cont bb.  Previously on ARB - held prior to last d/c secondary to CKD - continue to hold in setting of ongoing worsening of renal fxn.  Systolic dysfxn felt to be out of proportion to CAD, i.e. Tachy medicated.  Repeat echo as outpt now that she is back in sinus.  3.  CAD:  S/p NSTEMI 05/2012 with DES to OM.  No chest pain.  Cont asa, plavix, bb, nitrate.  She is intolerant to statins.  4.  Afib:  Maintaining sinus.  Amio d/c 2/2 nausea.  Cont bb.  Consider initiating coumadin and discontinuation of asa in  setting of ongoing plavix Rx.  CHADS2: 3.  5.  Acute on chronic stage 4 kidney dzs:  F/u this AM.  Lasix on hold.  ARB on hold from last admission.  6.  Hyponatremia:  F/u this am.  Signed, Rollene Rotunda NP  History and all data above reviewed.  Patient examined.  I agree with the findings as above.  She feels much better today.  Eating OK.  She is maintaining NSR.   The patient exam reveals COR:RRR  ,  Lungs: Clear  ,  Abd: Positive bowel sounds, no rebound no guarding, Ext No edema  .  All available labs, radiology testing, previous records reviewed. Agree with documented assessment and plan. As above.  Still holding the ARB and Lasix.  The Creat is improved.  We will comment on her home dose of Lasix and when to resume the ARB prior to discharge.   Fayrene Fearing Destiny Hagin  1:56 PM  08/17/2012

## 2012-08-18 LAB — BASIC METABOLIC PANEL
BUN: 46 mg/dL — ABNORMAL HIGH (ref 6–23)
BUN: 44 mg/dL — ABNORMAL HIGH (ref 6–23)
Calcium: 9.7 mg/dL (ref 8.4–10.5)
Creatinine, Ser: 1.9 mg/dL — ABNORMAL HIGH (ref 0.50–1.10)
GFR calc Af Amer: 28 mL/min — ABNORMAL LOW (ref >90)
GFR calc non Af Amer: 23 mL/min — ABNORMAL LOW (ref >90)
GFR calc non Af Amer: 24 mL/min — ABNORMAL LOW (ref >90)
Glucose, Bld: 101 mg/dL — ABNORMAL HIGH (ref 70–99)
Glucose, Bld: 127 mg/dL — ABNORMAL HIGH (ref 70–99)

## 2012-08-18 LAB — URINE CULTURE: Colony Count: >=100000

## 2012-08-18 LAB — CBC
HCT: 33.7 % — ABNORMAL LOW (ref 36.0–46.0)
Hemoglobin: 11.2 g/dL — ABNORMAL LOW (ref 12.0–15.0)
MCH: 29.9 pg (ref 26.0–34.0)
MCHC: 33.2 g/dL (ref 30.0–36.0)
MCV: 90.1 fL (ref 78.0–100.0)

## 2012-08-18 MED ORDER — AMOXICILLIN 500 MG PO CAPS
500.0000 mg | ORAL_CAPSULE | Freq: Two times a day (BID) | ORAL | Status: DC
  Filled 2012-08-18: qty 1

## 2012-08-18 MED ORDER — NISOLDIPINE ER 25.5 MG PO TB24: 25.5000 mg | ORAL_TABLET | Freq: Every day | ORAL | Status: DC

## 2012-08-18 MED ORDER — AMOXICILLIN 500 MG PO CAPS: 500.0000 mg | ORAL_CAPSULE | Freq: Two times a day (BID) | ORAL | Status: DC

## 2012-08-18 MED ORDER — ENSURE COMPLETE PO LIQD: 237.0000 mL | Freq: Two times a day (BID) | ORAL | Status: DC

## 2012-08-18 NOTE — Progress Notes (Signed)
Subjective: Feeling better, breathing better. No specific concerns. Ambulating in the room.  Objective: Vital signs in last 24 hours: Filed Vitals:   08/17/12 0535 08/17/12 1334 08/17/12 2013 08/18/12 0533  BP: 155/62 107/50 145/61 167/55  Pulse: 57 52 55 63  Temp: 98.5 F (36.9 C) 98.5 F (36.9 C) 98.1 F (36.7 C) 97.9 F (36.6 C)  TempSrc: Oral Oral Oral Oral  Resp: 18 18 18 18   Height:      Weight: 57.4 kg (126 lb 8.7 oz)   57.561 kg (126 lb 14.4 oz)  SpO2: 94% 95% 94% 95%   Weight change: -0.238 kg (-8.4 oz)  Intake/Output Summary (Last 24 hours) at 08/18/12 1104 Last data filed at 08/18/12 1049  Gross per 24 hour  Intake   1343 ml  Output   1751 ml  Net   -408 ml    Physical Exam: General: Awake, Oriented, No acute distress. HEENT: EOMI. Neck: Supple CV: S1 and S2 Lungs: Clear to ascultation bilaterally Abdomen: Soft, Nontender, Nondistended, +bowel sounds. Ext: Good pulses. Trace edema.  Lab Results: Basic Metabolic Panel:  Lab 08/18/12 1610 08/17/12 1038 08/16/12 0430 08/15/12 0610 08/15/12 0035 08/15/12 0006  NA 135 133* 129* 129* 130* --  K 6.1* 4.6 4.5 3.8 4.4 --  CL 95* 93* 93* 92* 97 --  CO2 29 32 28 21 -- 24  GLUCOSE 101* 132* 106* 155* 155* --  BUN 46* 43* 52* 43* 43* --  CREATININE 1.94* 2.01* 2.53* 2.05* 2.00* --  CALCIUM 9.7 9.6 9.2 9.2 -- 9.9  MG -- -- -- -- -- 2.3  PHOS -- -- -- -- -- --   Liver Function Tests:  Lab 08/15/12 0610 08/15/12 0006  AST 22 24  ALT 21 25  ALKPHOS 55 65  BILITOT 0.3 0.4  PROT 6.8 7.9  ALBUMIN 3.4* 3.9    Lab 08/15/12 0610  LIPASE 53  AMYLASE --   No results found for this basename: AMMONIA:5 in the last 168 hours CBC:  Lab 08/18/12 0535 08/16/12 0430 08/15/12 0610 08/15/12 0035 08/15/12 0006 08/13/12 0530  WBC 10.2 10.6* 10.3 -- 12.1* 9.4  NEUTROABS -- -- 8.9* -- 11.1* --  HGB 11.2* 9.9* 10.3* 12.2 11.9* --  HCT 33.7* 28.8* 29.8* 36.0 34.2* --  MCV 90.1 88.1 87.4 -- 87.9 87.8  PLT 353 281 291  -- 360 246   Cardiac Enzymes:  Lab 08/15/12 1741 08/15/12 1115 08/15/12 0610 08/15/12 0007  CKTOTAL -- -- -- 72  CKMB -- -- -- 5.1*  CKMBINDEX -- -- -- --  TROPONINI <0.30 <0.30 <0.30 <0.30   BNP (last 3 results)  Basename 08/15/12 0610 08/15/12 0007 08/10/12 1107  PROBNP 15425.0* 9011.0* 13275.0*   CBG: No results found for this basename: GLUCAP:5 in the last 168 hours No results found for this basename: HGBA1C:5 in the last 72 hours Other Labs: No components found with this basename: POCBNP:3 No results found for this basename: DDIMER:2 in the last 168 hours No results found for this basename: CHOL:2,HDL:2,LDLCALC:2,TRIG:2,CHOLHDL:2,LDLDIRECT:2 in the last 168 hours  Lab 08/15/12 0610  TSH 1.037  T4TOTAL --  T3FREE --  FREET4 --  THYROIDAB --    Lab 08/15/12 1740  VITAMINB12 364  FOLATE 9.9  FERRITIN 66  TIBC 344  IRON 34*  RETICCTPCT 1.7    Micro Results: Recent Results (from the past 240 hour(s))  URINE CULTURE     Status: Normal   Collection Time   08/10/12  3:39 PM  Component Value Range Status Comment   Specimen Description URINE, CLEAN CATCH   Final    Special Requests NONE   Final    Culture  Setup Time 08/11/2012 19:01   Final    Colony Count 7,000 COLONIES/ML   Final    Culture INSIGNIFICANT GROWTH   Final    Report Status 08/13/2012 FINAL   Final   MRSA PCR SCREENING     Status: Normal   Collection Time   08/10/12  7:51 PM      Component Value Range Status Comment   MRSA by PCR NEGATIVE  NEGATIVE Final   URINE CULTURE     Status: Normal   Collection Time   08/15/12  2:40 AM      Component Value Range Status Comment   Specimen Description URINE, CATHETERIZED   Final    Special Requests CX ADDED AT 0630   Final    Culture  Setup Time 08/15/2012 12:18   Final    Colony Count >=100,000 COLONIES/ML   Final    Culture     Final    Value: STAPHYLOCOCCUS SPECIES (COAGULASE NEGATIVE)     Note: RIFAMPIN AND GENTAMICIN SHOULD NOT BE USED AS SINGLE  DRUGS FOR TREATMENT OF STAPH INFECTIONS.   Report Status 08/18/2012 FINAL   Final    Organism ID, Bacteria STAPHYLOCOCCUS SPECIES (COAGULASE NEGATIVE)   Final     Studies/Results: Dg Chest Port 1 View  08/17/2012  *RADIOLOGY REPORT*  Clinical Data: Cough, nasal congestion  PORTABLE CHEST - 1 VIEW  Comparison: Chest radiograph 08/15/2012  Findings: Stable enlarged heart silhouette.  There is left lower lobe atelectasis versus infiltrate.  Lungs are hyperinflated.  No pulmonary edema.  No pneumothorax.  IMPRESSION:  1.  Left lower lobe atelectasis versus infiltrate. 2.  Emphysematous change.   Original Report Authenticated By: Genevive Bi, M.D.     Medications: I have reviewed the patient's current medications. Scheduled Meds:    . amoxicillin  500 mg Oral Q8H  . aspirin EC  81 mg Oral Daily  . bisoprolol  10 mg Oral Daily  . clopidogrel  75 mg Oral Q breakfast  . feeding supplement  237 mL Oral BID BM  . isosorbide mononitrate  30 mg Oral Daily  . nisoldipine  25.5 mg Oral Daily  . omega-3 acid ethyl esters  1 g Oral BID  . sodium chloride  3 mL Intravenous Q12H  . sodium chloride  3 mL Intravenous Q12H  . DISCONTD: nisoldipine  17 mg Oral Daily  . DISCONTD: potassium chloride  20 mEq Oral Daily   Continuous Infusions:    . sodium chloride Stopped (08/15/12 1700)   PRN Meds:.acetaminophen, acetaminophen, HYDROcodone-acetaminophen, nitroGLYCERIN, ondansetron (ZOFRAN) IV, ondansetron  Assessment/Plan: Acute respiratory failure  Suspect possible aspiration from explosive vomiting with a component of heart failure. Currently on amoxicillin, antibiotics since 08/15/2012, 3 more days to complete 7 day course of antibiotics. Cardiology recommends to continue to hold Lasix and ARB, renal function improved. Repeat CXR today shows LLL atelectasis versys infiltrate on 08/17/2012.   Hyperkalemia Likely due to hemolysis. Resend BMET.  Atrial fibrillation  Patient suspects that  Amiodarone started her symptoms of vomiting, intolerant to Amiodarone. Currently she is in NSR and on a B blocker (bisoprolol). Cardiology managing.   Acute on chronic combined systolic and diastolic CHF, NYHA class 3  As above, Lasix to be held until seen in clinic on 08/21/2012. 2 gm Na diet with 1500 ml fluid restriction.  CAD (coronary artery disease)  Management per cardiology.   Acute renal failure on Chronic CKD 4  Lasix held for today. Renal function has improved.   Hyponatremia  Likely from dehydration, Resolved.   UTI  Coag negative staph, currently on amoxicillin, pansensitive. Continue amoxicillin for 3 more days to complete 7 day course.   Generalized weakness PT/OT evaluation requested.   Code Status: Full code  Family Communication: Discussed with patient. Disposition Plan: Continue in telemetry, DC today, discussed with Dr. [name], cardiology prior to discharge.  DVT prophylaxis: SCDs.    LOS: 4 days  Kellen Dutch A, MD 08/18/2012, 11:04 AM

## 2012-08-18 NOTE — Discharge Summary (Signed)
Physician Discharge Summary  [name] ZOX:[id number] DOB: 1930-05-04 DOA: 08/14/2012  PCP: [name], MD  Admit date: 08/14/2012 Discharge date: 08/18/2012  Recommendations for Outpatient Follow-up:  1. Followup with [facility] on 08/21/2012 as already scheduled, will need labs to be done at the clinic visit. Cardiology to make descion about lasix and ACE/ARB at the next clinic visit. 2. Followup with [name], MD (PCP) in 1 week.  Discharge Diagnoses:  Principal Problem:  *Acute respiratory failure Active Problems:  Hyperlipidemia  Atrial fibrillation  Acute on chronic combined systolic and diastolic CHF, NYHA class 3  CKD (chronic kidney disease), stage IV  CAD (coronary artery disease)  Discharge Condition: Stable  Diet recommendation: Heart healthy 2 gm sodium, with 1.5 L of fluid restriction.  Filed Weights   08/16/12 1206 08/17/12 0535 08/18/12 0533  Weight: 57.8 kg (127 lb 6.8 oz) 57.4 kg (126 lb 8.7 oz) 57.561 kg (126 lb 14.4 oz)    History of present illness:  78 year-old female with history of recent drug-eluting stent placement for CAD and just discharged 2 days ago after being treated for combined systolic and diastolic heart failure with EF of 35% and at that time patient was found to be in atrial fibrillation was brought to the ER by patient's daughter patient was found to be acutely short of breath on 08/15/2012.  Hospital Course:  Acute respiratory failure  Suspect possible aspiration from explosive vomiting with a component of heart failure. Currently on amoxicillin, antibiotics since 08/15/2012, 3 more days to complete 7 day course of antibiotics. Cardiology recommends to continue to hold Lasix and ARB, renal function improved during the hospital stay. Repeat CXR today shows LLL atelectasis versys infiltrate on 08/17/2012.   Hyperkalemia Resolved on recheck, likely due to hemolysis.  Atrial fibrillation  Patient suspects that Amiodarone  started her symptoms of vomiting, intolerant to Amiodarone. Currently she is in NSR and on a B blocker (bisoprolol). Cardiology managing, cardiology will decide anticoagulation after discharge.  Acute on chronic combined systolic and diastolic CHF, NYHA class 3  As above, Lasix to be held until seen in clinic on 08/21/2012. 2 gm Na diet with 1500 ml fluid restriction.   CAD (coronary artery disease)  Management per cardiology.   Acute renal failure on Chronic CKD 4  Improved, lasix held at discharge. Cardiology will decide when to resume lasix at the next clinic visit.   Hyponatremia  Likely from dehydration, Resolved.   UTI  Coag negative staph, currently on amoxicillin, pansensitive. Continue amoxicillin for 3 more days to complete 7 day course.   Generalized weakness Patient ambulating in the room prior to discharge without difficulty.  Procedures:  As above.  Consultations:  [facility], Dr. [name].  Discharge Exam: Filed Vitals:   08/17/12 1334 08/17/12 2013 08/18/12 0533 08/18/12 1114  BP: 107/50 145/61 167/55 166/71  Pulse: 52 55 63 63  Temp: 98.5 F (36.9 C) 98.1 F (36.7 C) 97.9 F (36.6 C) 96.6 F (35.9 C)  TempSrc: Oral Oral Oral Oral  Resp: 18 18 18 20   Height:      Weight:   57.561 kg (126 lb 14.4 oz)   SpO2: 95% 94% 95% 97%   Discharge Instructions  Discharge Orders    Future Appointments: Provider: Department: Dept Phone: Center:   08/21/2012 2:15 PM Ok Anis, NP [facility] [phone] [facility]     Future Orders Please Complete By Expires   Diet - low sodium heart healthy  Comments:   2gm sodium diet, 1.5 L of fluid.   Increase activity slowly      Heart Failure patients record your daily weight using the same scale at the same time of day      Contraindication to ACEI at discharge      Comments:   Due to Acute renal failure.       Medication List     As of 08/18/2012 11:22 AM    STOP taking these  medications         amiodarone 200 MG tablet   Commonly known as: PACERONE      furosemide 40 MG tablet   Commonly known as: LASIX      potassium chloride 10 MEQ tablet   Commonly known as: K-DUR      TAKE these medications         amoxicillin 500 MG capsule   Commonly known as: AMOXIL   Take 1 capsule (500 mg total) by mouth every 12 (twelve) hours.      aspirin EC 81 MG tablet   Take 1 tablet (81 mg total) by mouth daily.      b complex vitamins tablet   Take 1 tablet by mouth daily.      bisoprolol 10 MG tablet   Commonly known as: ZEBETA   Take 1 tablet (10 mg total) by mouth daily.      Cinnamon 500 MG Tabs   Take 1 tablet by mouth 2 (two) times daily.      clopidogrel 75 MG tablet   Commonly known as: PLAVIX   Take 1 tablet (75 mg total) by mouth daily with breakfast.      Cod Liver Oil Caps   Take 1 capsule by mouth daily.      feeding supplement Liqd   Take 237 mLs by mouth 2 (two) times daily between meals.      fish oil-omega-3 fatty acids 1000 MG capsule   Take 1 g by mouth 2 (two) times daily.      HYDROcodone-acetaminophen 5-500 MG per tablet   Commonly known as: VICODIN   Take 1 tablet by mouth every 6 (six) hours as needed. For pain      isosorbide mononitrate 30 MG 24 hr tablet   Commonly known as: IMDUR   Take 1 tablet (30 mg total) by mouth daily.      Krill Oil 300 MG Caps   Take 1 capsule by mouth daily.      L-Lysine 500 MG Caps   Take 1 capsule by mouth 2 (two) times daily.      nisoldipine 25.5 MG 24 hr tablet   Commonly known as: SULAR   Take 1 tablet (25.5 mg total) by mouth daily.      nitroGLYCERIN 0.4 MG SL tablet   Commonly known as: NITROSTAT   Place 1 tablet (0.4 mg total) under the tongue every 5 (five) minutes x 3 doses as needed for chest pain.      oyster calcium 500 MG Tabs   Take 500 mg of elemental calcium by mouth daily.      vitamin C 500 MG tablet   Commonly known as: ASCORBIC ACID   Take 500 mg by mouth  daily.      Vitamin D-3 5000 UNITS Tabs   Take 1 tablet by mouth daily.      vitamin E 400 UNIT capsule   Take 400 Units by mouth daily.  Follow-up Information    Follow up with [name], NP. On 08/21/2012. (2:15 PM - Dr. Earmon Phoenix Nurse Practitioner)    Contact information:   [address] [place] [address] [phone]       Follow up with [name], MD. Schedule an appointment as soon as possible for a visit in 1 week.   Contact information:   [address] [place] [address] [phone]           The results of significant diagnostics from this hospitalization (including imaging, microbiology, ancillary and laboratory) are listed below for reference.    Significant Diagnostic Studies: Dg Chest 2 View  08/15/2012  *RADIOLOGY REPORT*  Clinical Data: Shortness of breath with nausea vomiting.  CHEST - 2 VIEW  Comparison: 08/10/2012  Findings: Cardiac enlargement persists.  Calcified tortuous aorta. Mild changes consistent with pulmonary edema similar to priors. Bony demineralization.  IMPRESSION: Cardiac enlargement with mild pulmonary edema.  Similar to priors.   Original Report Authenticated By: Elsie Stain, M.D.    Dg Chest Port 1 View  08/17/2012  *RADIOLOGY REPORT*  Clinical Data: Cough, nasal congestion  PORTABLE CHEST - 1 VIEW  Comparison: Chest radiograph 08/15/2012  Findings: Stable enlarged heart silhouette.  There is left lower lobe atelectasis versus infiltrate.  Lungs are hyperinflated.  No pulmonary edema.  No pneumothorax.  IMPRESSION:  1.  Left lower lobe atelectasis versus infiltrate. 2.  Emphysematous change.   Original Report Authenticated By: Genevive Bi, M.D.    Dg Chest Portable 1 View  08/10/2012  *RADIOLOGY REPORT*  Clinical Data: Shortness of breath, fatigue  PORTABLE CHEST - 1 VIEW  Comparison: 08/10/2012  Findings: Moderate to marked cardiomegaly noted with new interstitial Kerley B lines and  trace pleural effusions.  No focal pulmonary opacity.  No pneumothorax.  No acute osseous finding. Aorta is ectatic and unfolded.   Diffusely coarsened interstitial markings compatible with previously reported chronic lung disease.  IMPRESSION: Interstitial Kerley B lines and trace pleural fluid may indicate pulmonary edema, superimposed on underlying chronic lung disease.   Original Report Authenticated By: Harrel Lemon, M.D.    Dg Chest Port 1 View  08/10/2012  *RADIOLOGY REPORT*  Clinical Data: Shortness of breath.  PORTABLE CHEST - 1 VIEW  Comparison: 06/06/2012 and 02/14/2012.  Findings: 1210 hours.  Cardiomegaly and aortic tortuosity appear stable.  There is stable biapical pleural parenchymal scarring.  No superimposed airspace disease, edema or pleural effusion is seen. Osseous structures appear unchanged.  Telemetry leads overlie the chest.  IMPRESSION: Stable cardiomegaly and chronic biapical lung disease.  No acute cardiopulmonary process.   Original Report Authenticated By: Gerrianne Scale, M.D.     Microbiology: Recent Results (from the past 240 hour(s))  URINE CULTURE     Status: Normal   Collection Time   08/10/12  3:39 PM      Component Value Range Status Comment   Specimen Description URINE, CLEAN CATCH   Final    Special Requests NONE   Final    Culture  Setup Time 08/11/2012 19:01   Final    Colony Count 7,000 COLONIES/ML   Final    Culture INSIGNIFICANT GROWTH   Final    Report Status 08/13/2012 FINAL   Final   MRSA PCR SCREENING     Status: Normal   Collection Time   08/10/12  7:51 PM      Component Value Range Status Comment   MRSA by PCR NEGATIVE  NEGATIVE Final   URINE CULTURE     Status: Normal   Collection Time   08/15/12  2:40 AM      Component Value Range Status Comment   Specimen Description URINE, CATHETERIZED   Final    Special Requests CX ADDED AT 0630   Final    Culture  Setup Time 08/15/2012 12:18   Final    Colony Count >=100,000 COLONIES/ML    Final    Culture     Final    Value: STAPHYLOCOCCUS SPECIES (COAGULASE NEGATIVE)     Note: RIFAMPIN AND GENTAMICIN SHOULD NOT BE USED AS SINGLE DRUGS FOR TREATMENT OF STAPH INFECTIONS.   Report Status 08/18/2012 FINAL   Final    Organism ID, Bacteria STAPHYLOCOCCUS SPECIES (COAGULASE NEGATIVE)   Final      Labs: Basic Metabolic Panel:  Lab 08/18/12 1610 08/17/12 1038 08/16/12 0430 08/15/12 0610 08/15/12 0035 08/15/12 0006  NA 135 133* 129* 129* 130* --  K 6.1* 4.6 4.5 3.8 4.4 --  CL 95* 93* 93* 92* 97 --  CO2 29 32 28 21 -- 24  GLUCOSE 101* 132* 106* 155* 155* --  BUN 46* 43* 52* 43* 43* --  CREATININE 1.94* 2.01* 2.53* 2.05* 2.00* --  CALCIUM 9.7 9.6 9.2 9.2 -- 9.9  MG -- -- -- -- -- 2.3  PHOS -- -- -- -- -- --   Liver Function Tests:  Lab 08/15/12 0610 08/15/12 0006  AST 22 24  ALT 21 25  ALKPHOS 55 65  BILITOT 0.3 0.4  PROT 6.8 7.9  ALBUMIN 3.4* 3.9    Lab 08/15/12 0610  LIPASE 53  AMYLASE --   No results found for this basename: AMMONIA:5 in the last 168 hours CBC:  Lab 08/18/12 0535 08/16/12 0430 08/15/12 0610 08/15/12 0035 08/15/12 0006 08/13/12 0530  WBC 10.2 10.6* 10.3 -- 12.1* 9.4  NEUTROABS -- -- 8.9* -- 11.1* --  HGB 11.2* 9.9* 10.3* 12.2 11.9* --  HCT 33.7* 28.8* 29.8* 36.0 34.2* --  MCV 90.1 88.1 87.4 -- 87.9 87.8  PLT 353 281 291 -- 360 246   Cardiac Enzymes:  Lab 08/15/12 1741 08/15/12 1115 08/15/12 0610 08/15/12 0007  CKTOTAL -- -- -- 72  CKMB -- -- -- 5.1*  CKMBINDEX -- -- -- --  TROPONINI <0.30 <0.30 <0.30 <0.30   BNP: BNP (last 3 results)  Basename 08/15/12 0610 08/15/12 0007 08/10/12 1107  PROBNP 15425.0* 9011.0* 13275.0*   CBG: No results found for this basename: GLUCAP:5 in the last 168 hours  Time coordinating discharge: 40 minutes  Signed:  Baillie Mohammad A  [facility] 08/18/2012, 11:22 AM

## 2012-08-18 NOTE — Progress Notes (Signed)
SUBJECTIVE:  She feels very well.  No chest pain or SOB   PHYSICAL EXAM Filed Vitals:   08/17/12 0535 08/17/12 1334 08/17/12 2013 08/18/12 0533  BP: 155/62 107/50 145/61 167/55  Pulse: 57 52 55 63  Temp: 98.5 F (36.9 C) 98.5 F (36.9 C) 98.1 F (36.7 C) 97.9 F (36.6 C)  TempSrc: Oral Oral Oral Oral  Resp: 18 18 18 18   Height:      Weight: 126 lb 8.7 oz (57.4 kg)   126 lb 14.4 oz (57.561 kg)  SpO2: 94% 95% 94% 95%   General:  No distress HEENT:  PERRL Lungs:  Clear Heart:  RRR Abdomen:  Positive bowel sounds, no rebound no guarding Extremities:  No edema Neuro:  Nonfocal  LABS: Lab Results  Component Value Date   CKTOTAL 72 08/15/2012   CKMB 5.1* 08/15/2012   TROPONINI <0.30 08/15/2012   Results for orders placed during the hospital encounter of 08/14/12 (from the past 24 hour(s))  OCCULT BLOOD X 1 CARD TO LAB, STOOL     Status: Normal   Collection Time   08/17/12  4:55 PM      Component Value Range   Fecal Occult Bld NEGATIVE    CBC     Status: Abnormal   Collection Time   08/18/12  5:35 AM      Component Value Range   WBC 10.2  4.0 - 10.5 K/uL   RBC 3.74 (*) 3.87 - 5.11 MIL/uL   Hemoglobin 11.2 (*) 12.0 - 15.0 g/dL   HCT 16.1 (*) 09.6 - 04.5 %   MCV 90.1  78.0 - 100.0 fL   MCH 29.9  26.0 - 34.0 pg   MCHC 33.2  30.0 - 36.0 g/dL   RDW 40.9  81.1 - 91.4 %   Platelets 353  150 - 400 K/uL  BASIC METABOLIC PANEL     Status: Abnormal   Collection Time   08/18/12  5:35 AM      Component Value Range   Sodium 135  135 - 145 mEq/L   Potassium 6.1 (*) 3.5 - 5.1 mEq/L   Chloride 95 (*) 96 - 112 mEq/L   CO2 29  19 - 32 mEq/L   Glucose, Bld 101 (*) 70 - 99 mg/dL   BUN 46 (*) 6 - 23 mg/dL   Creatinine, Ser 7.82 (*) 0.50 - 1.10 mg/dL   Calcium 9.7  8.4 - 95.6 mg/dL   GFR calc non Af Amer 23 (*) >90 mL/min   GFR calc Af Amer 27 (*) >90 mL/min    Intake/Output Summary (Last 24 hours) at 08/18/12 1049 Last data filed at 08/18/12 1049  Gross per 24 hour  Intake    1343 ml  Output   1751 ml  Net   -408 ml    ASSESSMENT AND PLAN:  1. Acute respiratory failure: Multifactorial - Feeling much better. Volume stable.    2. Acute on chronic combined systolic/diastolic CHF: As above, feeling better. Repeat echo as outpt now that she is back in sinus.  After careful review today I would suggest that we not send her home on Lasix.  She needs 2 gm Na diet and she needs 1500 cc fluid restrict.  We will need to see her soon in follow up to reconsider ARB and diuretic.  I will arrange this.   3. CAD: S/p NSTEMI 05/2012 with DES to OM. No chest pain. Continue current therapies  4. Afib: Maintaining sinus. Intolerant of  amiodarone.  This is a difficult anticoagulation issue as she had atrial fib but also a recent DES.  For now ASA and Plavix and no warfarin.  If she has recurrent fib we will need to reconsider this.   5. Acute on chronic stage 4 kidney dzs:  Creat is coming down.  However, I would still hold the ARB and the Lasix for now.  (Repeat BMET pending for elevated potassium).    6. HTN:  I will increase the Sular.  I will have our office call her for follow up next week.    Fayrene Fearing [facility] 08/18/2012 10:49 AM

## 2012-08-18 NOTE — Progress Notes (Signed)
Patient discharged with all belongings and verbalizes understanding of all discharge paperwork. Patient education given to patient regarding 2 gm sodium diet and fluid restrictions. Anastasia Fiedler RN

## 2012-08-18 NOTE — Evaluation (Signed)
Physical Therapy Evaluation Patient Details Name: [name] MRN: [id number] DOB: [birth year] Today's Date: 08/18/2012 Time: 5638-7564 PT Time Calculation (min): 12 min  PT Assessment / Plan / Recommendation Clinical Impression  Patient is an 78 yo female admitted with CHF.  Patient is independent with all mobility and gait today.  No acute PT needs identified - PT will sign off.  Encouraged patient to continue ambulating in hallway with nursing.    PT Assessment  Patent does not need any further PT services    Follow Up Recommendations  No PT follow up    Barriers to Discharge None      Equipment Recommendations  None recommended by PT    Recommendations for Other Services     Frequency      Precautions / Restrictions Precautions Precautions: None Restrictions Weight Bearing Restrictions: No         Mobility  Bed Mobility Bed Mobility: Supine to Sit;Sitting - Scoot to Edge of Bed Supine to Sit: 7: Independent Sitting - Scoot to Edge of Bed: 7: Independent Details for Bed Mobility Assistance: No cues or assist needed Transfers Transfers: Stand to Sit;Sit to Stand Sit to Stand: 7: Independent;With upper extremity assist;From bed Stand to Sit: 7: Independent;With upper extremity assist;With armrests;To chair/3-in-1 Details for Transfer Assistance: No assist or cues needed.  Safe with transfers Ambulation/Gait Ambulation/Gait Assistance: 7: Independent Ambulation Distance (Feet): 80 Feet Assistive device: None Ambulation/Gait Assistance Details: Good balance with gait. Normal gait pattern Gait Pattern: Within Functional Limits Gait velocity: slower     PT Goals  N/A  Visit Information  Last PT Received On: 08/18/12 Assistance Needed: +1    Subjective Data  Subjective: "I've been up walking around" Patient Stated Goal: To go home   Prior Functioning  Home Living Lives With: Other (Comment) (Great grandson lives with her) Available Help at  Discharge: Family Type of Home: House Home Access: Stairs to enter Secretary/administrator of Steps: 3 Entrance Stairs-Rails: Right Home Layout: Two level;Able to live on main level with bedroom/bathroom Alternate Level Stairs-Number of Steps: flight Alternate Level Stairs-Rails: Right;Left Bathroom Shower/Tub: Tub/shower unit;Curtain Bathroom Toilet: Standard Bathroom Accessibility: No Home Adaptive Equipment: Shower chair without back;Walker - rolling;Straight cane Prior Function Level of Independence: Independent Able to Take Stairs?: Yes Driving: Yes Communication Communication: No difficulties Dominant Hand: Right    Cognition  Overall Cognitive Status: Appears within functional limits for tasks assessed/performed Arousal/Alertness: Awake/alert Orientation Level: Oriented X4 / Intact Behavior During Session: [facility] for tasks performed    Extremity/Trunk Assessment Right Upper Extremity Assessment RUE ROM/Strength/Tone: Within functional levels Left Upper Extremity Assessment LUE ROM/Strength/Tone: Within functional levels Right Lower Extremity Assessment RLE ROM/Strength/Tone: Within functional levels Left Lower Extremity Assessment LLE ROM/Strength/Tone: Within functional levels Trunk Assessment Trunk Assessment: Normal   Balance Balance Balance Assessed: Yes Static Standing Balance Single Leg Stance - Right Leg: 25  Single Leg Stance - Left Leg: 12  High Level Balance High Level Balance Activites: Direction changes;Turns;Sudden stops;Head turns High Level Balance Comments: No loss of balance with high level balance activities  End of Session PT - End of Session Equipment Utilized During Treatment: Gait belt Activity Tolerance: Patient tolerated treatment well Patient left: in chair;with call bell/phone within reach;with nursing in room Nurse Communication: Mobility status  GP     Vena Austria 08/18/2012, 12:17 PM Durenda Hurt. Renaldo Fiddler, [facility] Acute Rehab  Services Pager [phone]

## 2012-08-20 ENCOUNTER — Telehealth: Payer: Self-pay | Admitting: Pharmacist

## 2012-08-20 NOTE — Telephone Encounter (Signed)
Patient called stated she was discharged from hospital this past Sat 08/18/12.States she was told she needed to see a PA as soon as possible.Patient has appointment with Ward Givens NP tomorrow 08/21/12 at 2:15 pm.

## 2012-08-20 NOTE — Telephone Encounter (Signed)
Message copied by Velda Shell on Mon Aug 20, 2012  8:39 AM ------      Message from: Rollene Rotunda      Created: Sat Aug 18, 2012 10:46 AM       This patient needs to be seen early next week with a BMET she will need a phone call for transition of care.

## 2012-08-21 ENCOUNTER — Telehealth: Payer: Self-pay

## 2012-08-21 ENCOUNTER — Ambulatory Visit (INDEPENDENT_AMBULATORY_CARE_PROVIDER_SITE_OTHER): Payer: Medicare Other | Admitting: Nurse Practitioner

## 2012-08-21 ENCOUNTER — Encounter: Payer: Self-pay | Admitting: Nurse Practitioner

## 2012-08-21 VITALS — BP 112/54 | HR 54 | Ht 61.0 in | Wt 126.8 lb

## 2012-08-21 DIAGNOSIS — I119 Hypertensive heart disease without heart failure: Secondary | ICD-10-CM

## 2012-08-21 DIAGNOSIS — N184 Chronic kidney disease, stage 4 (severe): Secondary | ICD-10-CM

## 2012-08-21 DIAGNOSIS — I2510 Atherosclerotic heart disease of native coronary artery without angina pectoris: Secondary | ICD-10-CM

## 2012-08-21 DIAGNOSIS — I5042 Chronic combined systolic (congestive) and diastolic (congestive) heart failure: Secondary | ICD-10-CM | POA: Insufficient documentation

## 2012-08-21 LAB — BASIC METABOLIC PANEL
BUN: 56 mg/dL — ABNORMAL HIGH (ref 6–23)
Creatinine, Ser: 2.4 mg/dL — ABNORMAL HIGH (ref 0.4–1.2)
GFR: 20.22 mL/min — ABNORMAL LOW (ref >60.00)
Potassium: 5.9 mEq/L — ABNORMAL HIGH (ref 3.5–5.1)

## 2012-08-21 NOTE — Progress Notes (Signed)
Patient Name: [name] Date of Encounter: 08/21/2012  Primary Care Provider:  [name], MD Primary Cardiologist:  Judie Petit. Excell Seltzer, MD  Patient Profile  78 year old female status post recent hospitalization for A. Fib and subsequent hospitalization for nausea vomiting or respiratory failure in the setting of amiodarone and presents for followup.  Problem List   Past Medical History  Diagnosis Date  . Lower extremity edema     bilateral  . CKD (chronic kidney disease), stage III     a. baseline creat 1.6->2  . Labile hypertension   . Anxiety   . Failure to thrive in childhood   . History of shingles July 2004    on the left side of her thorax and postherpetic neuralgia  . History of psoriasis   . History of recurrent UTIs   . Major depressive disorder, single episode, severe 2003-04-24    "when my husband died"  . Valvular heart disease     a. 07/2012 Echo in setting of afib: EF 35%, Mild AI/MR, Mod Dil LA, Mod TR,  . CAD (coronary artery disease)     a. NSTEMI 05/2012 s/p DES to OM 06/07/12 (residual severe LCx subbranch dz for med rx unless refractory angina recurs)  . LBBB (left bundle branch block)     intermittent  . Chronic combined systolic and diastolic CHF (congestive heart failure)     a. 05/2012 Echo: EF 60-65%, Gr 1 DD, mild AI/MR, Mod TR;  b. 07/2012 Echo in setting of afib: EF 35%, Mild AI/MR, Mod Dil LA, Mod TR, PASP .  . Atrial fibrillation     a. Dx 07/2012 -> Amio started.  No coumadin 2/2 ongoing DAPT;  b. 08/15/2012 Amio d/c 2/2 nausea - maintaining sinus rhythm.   Past Surgical History  Procedure Date  . Salpingoophorectomy     "cyst removed"  . Appendectomy   . Breast cyst excision   . Abdominal hysterectomy   . Cataract extraction, bilateral ~ Apr 23, 2009  . Cardiac surgery     Allergies  Allergies  Allergen Reactions  . Metoprolol Swelling    Takes bisoprolol without issue  . Norvasc (Amlodipine Besylate) Swelling    Takes nisoldipine  without issue  . Sulfonamide Derivatives Hives, Itching and Swelling  . Amiodarone Nausea And Vomiting  . Statins     Myalgias - "I won't take them"    HPI  78 year old female with the above problem list.  She was hospitalized earlier this month with acute on chronic CHF and rapid A. Fib.  Echo during that hospitalization showed an EF of 35% in the setting of atrial fibrillation.  She was subsequently placed on amiodarone therapy and with rate control and diuresis she had significant clinical improvement and was discharged on September 16.  Unfortunately, in the setting of amiodarone, she developed significant nausea and vomiting followed by respiratory failure and was readmitted September 17.  Chest x-ray then showed left lower lobe atelectasis and there was some concern for aspiration in the setting of vomiting.  She is also felt to be volume overloaded and was initially diuresed but do to rising creatinine diuretics were discontinued.   She was placed on antibiotics.  We followed her during that hospitalization and amiodarone was discontinued.  Despite being off amiodarone she maintained sinus rhythm and was discharged home on September 21st.  Since discharge, she is been doing well.  She's had no chest pain or dyspnea on exertion.  She has been weighing herself daily and  on her scale she is weighed 120 pounds.  She is 126 pounds on our scale which is exactly what she was on the day she left the hospital.  She has no palpitations.  She denies PND, orthopnea, dizziness, syncope, edema, or early satiety.  Home Medications  Prior to Admission medications   Medication Sig Start Date End Date Taking? Authorizing Provider  amoxicillin (AMOXIL) 500 MG capsule Take 1 capsule (500 mg total) by mouth every 12 (twelve) hours. 08/18/12  Yes Cristal Ford, MD  aspirin EC 81 MG tablet Take 1 tablet (81 mg total) by mouth daily. 06/08/12  Yes Dayna N Dunn, PA  b complex vitamins tablet Take 1 tablet by mouth  daily.     Yes Historical Provider, MD  bisoprolol (ZEBETA) 10 MG tablet Take 1 tablet (10 mg total) by mouth daily. 08/13/12  Yes Ok Anis, NP  Cholecalciferol (VITAMIN D-3) 5000 UNITS TABS Take 1 tablet by mouth daily.   Yes Historical Provider, MD  Cinnamon 500 MG TABS Take 1 tablet by mouth 2 (two) times daily.    Yes Historical Provider, MD  clopidogrel (PLAVIX) 75 MG tablet Take 1 tablet (75 mg total) by mouth daily with breakfast. 06/08/12 06/08/13 Yes Dayna N Dunn, PA  Cod Liver Oil CAPS Take 1 capsule by mouth daily.   Yes Historical Provider, MD  feeding supplement (ENSURE COMPLETE) LIQD Take 237 mLs by mouth 2 (two) times daily between meals. 08/18/12  Yes Srikar Cherlynn Kaiser, MD  fish oil-omega-3 fatty acids 1000 MG capsule Take 1 g by mouth 2 (two) times daily.    Yes Historical Provider, MD  HYDROcodone-acetaminophen (VICODIN) 5-500 MG per tablet Take 1 tablet by mouth every 6 (six) hours as needed. For pain 05/11/12  Yes [name], MD  isosorbide mononitrate (IMDUR) 30 MG 24 hr tablet Take 1 tablet (30 mg total) by mouth daily. 06/08/12 06/08/13 Yes Dayna N Dunn, PA  Krill Oil 300 MG CAPS Take 1 capsule by mouth daily.    Yes Historical Provider, MD  L-Lysine 500 MG CAPS Take 1 capsule by mouth 2 (two) times daily.   Yes Historical Provider, MD  nisoldipine (SULAR) 25.5 MG 24 hr tablet Take 1 tablet (25.5 mg total) by mouth daily. 08/18/12  Yes Srikar Cherlynn Kaiser, MD  nitroGLYCERIN (NITROSTAT) 0.4 MG SL tablet Place 1 tablet (0.4 mg total) under the tongue every 5 (five) minutes x 3 doses as needed for chest pain. 06/08/12 06/08/13 Yes Dayna N Dunn, PA  Oyster Shell (OYSTER CALCIUM) 500 MG TABS Take 500 mg of elemental calcium by mouth daily.   Yes Historical Provider, MD  vitamin C (ASCORBIC ACID) 500 MG tablet Take 500 mg by mouth daily.   Yes Historical Provider, MD  vitamin E 400 UNIT capsule Take 400 Units by mouth daily.     Yes Historical Provider, MD    Review of  Systems  Doing well as above.  Her legs have felt tired after walking. All other systems reviewed and are otherwise negative except as noted above.  Physical Exam  Blood pressure 112/54, pulse 54, height 5\' 1"  (1.549 m), weight 126 lb 12.8 oz (57.516 kg), SpO2 93.00%.  General: Pleasant, NAD Psych: Normal affect. Neuro: Alert and oriented X 3. Moves all extremities spontaneously. HEENT: Normal  Neck: Supple without bruits or JVD. Lungs:  Resp regular and unlabored, CTA. Heart: RRR no s3, s4, 2/6 systolic murmur at the right upper sternal border. Abdomen: Soft, non-tender,  non-distended, BS + x 4.  Extremities: No clubbing, cyanosis or edema. DP/PT/Radials 2+ and equal bilaterally.  Accessory Clinical Findings  ECG - sinus bradycardia, 54, left bundle branch block, left axis deviation.  Assessment & Plan  1.  Chronic combined systolic and diastolic CHF: Patient is euvolemic on exam.  Her diuretics didn't help for roughly a week now due to renal insufficiency and volume stability.  We will check a basic metabolic profile today as her creatinine did rise to 2 during hospitalization with potassium of 6.2 at one point.I will plan to continue to hold her Lasix given volume stability.  She is weighing herself daily and knows to contact us for weight gain or recurrent symptoms.  She remains on bisoprolol therapy.  As noted several times in her hospital notes, her ARB remains on hold in the setting of acute on chronic renal disease.  We will consider reinitiation of low-dose ARB once we see what her renal function is today.  Of note, blood pressure is well controlled.  We will plan to followup 2-D echocardiogram in the near future as she is now back in sinus rhythm.  2.  Stage 3-4 chronic kidney disease: Followup bmet today.  3.  Coronary artery disease: Stable without chest pain.  Continue aspirin, Plavix, beta blocker.  She is intolerant to statins.  4.  Paroxysmal A. Fibrillation: She is in  sinus today.  Amiodarone was discontinued secondary to severe nausea and vomiting.  Continue beta blocker therapy.  She is currently on aspirin and Plavix therapy we are foregoing Coumadin anticoagulation at this time.  5.  Hypertension: Stable.  6.  Respiratory failure: believed to be multifactorial on last hospitalization in the setting of possible aspiration and CHF.  She's had significant improvement and finishes her antibiotic today.  7.  Disposition: Followup in the office in approximately 2 weeks to keep close tabs on her.  [name], NP 08/21/2012, 3:17 PM

## 2012-08-21 NOTE — Patient Instructions (Signed)
Your physician recommends that you return for lab work in: today  Your physician recommends that you schedule a follow-up appointment in: 2 to 3 weeks

## 2012-08-21 NOTE — Telephone Encounter (Signed)
**Note De-Identified [name] Obfuscation** Message copied by Demetrios Loll on Tue Aug 21, 2012  5:15 PM ------      Message from: [name] R      Created: Tue Aug 21, 2012  5:11 PM       Potassium is elevated as is creatinine.  She should increase fluid intake (dtr has been restricting her) and avoid high potassium foods (beans, green vegetables, oranges, cantaloupes, potatoes - more extensive lists online).  She will need a f/u bmet either 9/25 or 26.  As she has been off of her ARB and diuretic, she will also require referral to [facility] for Stage III-IV CKD.

## 2012-08-22 ENCOUNTER — Telehealth: Payer: Self-pay

## 2012-08-22 DIAGNOSIS — I119 Hypertensive heart disease without heart failure: Secondary | ICD-10-CM

## 2012-08-22 NOTE — Telephone Encounter (Signed)
**Note De-Identified Ashe Graybeal Obfuscation** Message copied by Demetrios Loll on Wed Aug 22, 2012 10:25 AM ------      Message from: [name] R      Created: Tue Aug 21, 2012  5:11 PM       Potassium is elevated as is creatinine.  She should increase fluid intake (dtr has been restricting her) and avoid high potassium foods (beans, green vegetables, oranges, cantaloupes, potatoes - more extensive lists online).  She will need a f/u bmet either 9/25 or 26.  As she has been off of her ARB and diuretic, she will also require referral to [facility] for Stage III-IV CKD.

## 2012-08-22 NOTE — Telephone Encounter (Signed)
Pt. advised, she verbalized understanding. Pt. states she will have BMET drawn today or tomorrow./LV

## 2012-08-23 ENCOUNTER — Other Ambulatory Visit (INDEPENDENT_AMBULATORY_CARE_PROVIDER_SITE_OTHER): Payer: Medicare Other

## 2012-08-23 DIAGNOSIS — I119 Hypertensive heart disease without heart failure: Secondary | ICD-10-CM

## 2012-08-23 LAB — BASIC METABOLIC PANEL
CO2: 29 mEq/L (ref 19–32)
Calcium: 9.1 mg/dL (ref 8.4–10.5)
GFR: 20.03 mL/min — ABNORMAL LOW (ref >60.00)
Sodium: 134 mEq/L — ABNORMAL LOW (ref 135–145)

## 2012-08-24 ENCOUNTER — Telehealth: Payer: Self-pay | Admitting: Cardiovascular Disease

## 2012-08-24 ENCOUNTER — Other Ambulatory Visit: Payer: Self-pay

## 2012-08-24 DIAGNOSIS — I119 Hypertensive heart disease without heart failure: Secondary | ICD-10-CM

## 2012-08-24 NOTE — Telephone Encounter (Signed)
Pt advised of Dr. Ludwig Clarks orders.  Will report back to [place] on Tuesday of B/P and pulse readings.

## 2012-08-24 NOTE — Telephone Encounter (Signed)
Change bisoprolol to 5 mg daily instead of 10 mg daily. Follow BP and pulse [name]

## 2012-08-24 NOTE — Telephone Encounter (Signed)
DOD Call/Dr. Excell Seltzer pt.  Pt recently d/c'd from [facility] after treatment for Acute Resp failure preceded by A-fib and drug-eluting stent placement.  Today c/o lower than usual b/p and pulse.  108/43, 42 and 118/47, 46.  Pt states she is tired and weak.  States her heart feels to be in rhythm, does not feel like when it was out of rhythm.  Please advise.

## 2012-08-24 NOTE — Telephone Encounter (Signed)
Pt is feeling really light headed and tired and wants to know if a change needed to be made regarding her medication b/p was 108/43 pulse was 42

## 2012-08-27 ENCOUNTER — Telehealth: Payer: Self-pay

## 2012-08-27 ENCOUNTER — Encounter: Payer: Self-pay | Admitting: Family Medicine

## 2012-08-27 ENCOUNTER — Ambulatory Visit (INDEPENDENT_AMBULATORY_CARE_PROVIDER_SITE_OTHER): Payer: Medicare Other | Admitting: Family Medicine

## 2012-08-27 VITALS — BP 142/62 | Temp 97.6°F | Wt 128.0 lb

## 2012-08-27 DIAGNOSIS — I4891 Unspecified atrial fibrillation: Secondary | ICD-10-CM

## 2012-08-27 DIAGNOSIS — N184 Chronic kidney disease, stage 4 (severe): Secondary | ICD-10-CM

## 2012-08-27 DIAGNOSIS — I5033 Acute on chronic diastolic (congestive) heart failure: Secondary | ICD-10-CM

## 2012-08-27 DIAGNOSIS — N189 Chronic kidney disease, unspecified: Secondary | ICD-10-CM

## 2012-08-27 DIAGNOSIS — I2510 Atherosclerotic heart disease of native coronary artery without angina pectoris: Secondary | ICD-10-CM

## 2012-08-27 DIAGNOSIS — Z23 Encounter for immunization: Secondary | ICD-10-CM

## 2012-08-27 NOTE — Progress Notes (Signed)
Subjective:    Patient ID: [name], female    DOB: [birth year], 78 y.o.   MRN: [id number]  HPI  Hospital followup. Patient has history of coronary artery disease, acute on chronic diastolic heart failure, atrial fibrillation, hyperlipidemia, chronic stage IV kidney disease, osteoporosis, aortic valve stenosis. Back in July she was admitted with unstable angina. Cardiac stent at that time. Patient was then admitted in September with atrial fibrillation and acute on chronic congestive heart failure. Echo in the setting of atrial fibrillation revealed ejection fraction about 35%. Patient was discharged on amiodarone. She was admitted 2 days later with vomiting felt secondary to amiodarone. She also had respiratory failure which is probably combination of congestive heart failure and possible aspiration component. She was treated with antibiotics and respiratory status improved.  Patient has rare cough at this time. She has some mild dyspnea with exertion which is overall greatly improved. Her weight is stable with daily weights around 120 pounds by her scales. She had worsening renal function and Lasix and angiotensin receptor blocker held during recent hospitalization. She has followup with nephrologist soon. She's had no further nausea or vomiting.  recent hyperkalemia and ensure has been held. She has scheduled followup basic metabolic panel with cardiology later this week. Patient also had some mild hyponatremia on admission felt to be secondary to vomiting and poor intake.  Pneumovax up-to-date. Needs flu vaccine  Past Medical History  Diagnosis Date  . Lower extremity edema     bilateral  . CKD (chronic kidney disease), stage III     a. baseline creat 1.6->2  . Labile hypertension   . Anxiety   . Failure to thrive in childhood   . History of shingles July 2004    on the left side of her thorax and postherpetic neuralgia  . History of psoriasis   . History of recurrent UTIs   .  Major depressive disorder, single episode, severe 04/15/2003    "when my husband died"  . Valvular heart disease     a. 07/2012 Echo in setting of afib: EF 35%, Mild AI/MR, Mod Dil LA, Mod TR,  . CAD (coronary artery disease)     a. NSTEMI 05/2012 s/p DES to OM 06/07/12 (residual severe LCx subbranch dz for med rx unless refractory angina recurs)  . LBBB (left bundle branch block)     intermittent  . Chronic combined systolic and diastolic CHF (congestive heart failure)     a. 05/2012 Echo: EF 60-65%, Gr 1 DD, mild AI/MR, Mod TR;  b. 07/2012 Echo in setting of afib: EF 35%, Mild AI/MR, Mod Dil LA, Mod TR, PASP .  . Atrial fibrillation     a. Dx 07/2012 -> Amio started.  No coumadin 2/2 ongoing DAPT;  b. 08/15/2012 Amio d/c 2/2 nausea - maintaining sinus rhythm.   Past Surgical History  Procedure Date  . Salpingoophorectomy     "cyst removed"  . Appendectomy   . Breast cyst excision   . Abdominal hysterectomy   . Cataract extraction, bilateral ~ 2009-04-14  . Cardiac surgery     reports that she quit smoking about 33 years ago. Her smoking use included Cigarettes. She has a 45 pack-year smoking history. She has never used smokeless tobacco. She reports that she drinks alcohol. She reports that she does not use illicit drugs. family history includes Cancer in an unspecified family member; Diabetes in an unspecified family member; and Lung cancer in an unspecified family member. Allergies  Allergen Reactions  .  Metoprolol Swelling    Takes bisoprolol without issue  . Norvasc (Amlodipine Besylate) Swelling    Takes nisoldipine without issue  . Sulfonamide Derivatives Hives, Itching and Swelling  . Amiodarone Nausea And Vomiting  . Statins     Myalgias - "I won't take them"      Review of Systems  Constitutional: Positive for fatigue. Negative for fever and chills.  Respiratory: Positive for shortness of breath. Negative for wheezing.   Cardiovascular: Negative for chest pain, palpitations  and leg swelling.  Gastrointestinal: Negative for nausea, vomiting and abdominal pain.  Genitourinary: Negative for dysuria.  Neurological: Negative for dizziness and syncope.  Psychiatric/Behavioral: Negative for confusion.       Objective:   Physical Exam  Constitutional: She appears well-developed and well-nourished.  HENT:  Mouth/Throat: Oropharynx is clear and moist.       Minimal cerumen left canal. Right canal clear  Neck: Neck supple. No thyromegaly present.  Cardiovascular: Normal rate and regular rhythm.        Regular rhythm no exam today with rate 56-60  Pulmonary/Chest: Effort normal and breath sounds normal. No respiratory distress. She has no wheezes. She has no rales.  Musculoskeletal: She exhibits no edema.  Lymphadenopathy:    She has no cervical adenopathy.          Assessment & Plan:  #1 atrial fibrillation. Currently sinus rhythm and patient on bisoprolol. She continues to take baby aspirin and Plavix. Followed closely by cardiology #2 recent acute respiratory failure probably combination of acute on chronic congestive heart failure with possible aspiration. No evidence for persistent pneumonia. Stable at this time.  #3 chronic kidney disease. She has followup with nephrologist. Recent creatinine 2.5. #4 hyperkalemia. Ensure held. Follow BMP ordered-through cardiology. #5 recent UTI. Clinically improved following amoxicillin. No residual symptoms  #6 health maintenance. Flu vaccine given. No contraindications.

## 2012-08-27 NOTE — Patient Instructions (Addendum)
Continue daily weights. Followup immediately for increased shortness of breath or chest pain.

## 2012-08-27 NOTE — Telephone Encounter (Signed)
**Note De-Identified Taishawn Smaldone Obfuscation** Left detailed message on pt's VM that her appt. with Tereso Newcomer, PAC has been changed from 10/15 at 8:50 am to 10/15 at 9:05 am.

## 2012-08-28 ENCOUNTER — Encounter: Payer: Medicare Other | Admitting: Physician Assistant

## 2012-08-31 ENCOUNTER — Other Ambulatory Visit (INDEPENDENT_AMBULATORY_CARE_PROVIDER_SITE_OTHER): Payer: Medicare Other

## 2012-08-31 DIAGNOSIS — I119 Hypertensive heart disease without heart failure: Secondary | ICD-10-CM

## 2012-08-31 LAB — BASIC METABOLIC PANEL
Chloride: 102 mEq/L (ref 96–112)
Potassium: 4.7 mEq/L (ref 3.5–5.1)

## 2012-09-11 ENCOUNTER — Ambulatory Visit (INDEPENDENT_AMBULATORY_CARE_PROVIDER_SITE_OTHER): Payer: Medicare Other | Admitting: Physician Assistant

## 2012-09-11 ENCOUNTER — Encounter: Payer: Self-pay | Admitting: Physician Assistant

## 2012-09-11 ENCOUNTER — Ambulatory Visit: Payer: Medicare Other | Admitting: Physician Assistant

## 2012-09-11 VITALS — BP 154/78 | HR 53 | Ht 61.0 in | Wt 131.4 lb

## 2012-09-11 DIAGNOSIS — I4891 Unspecified atrial fibrillation: Secondary | ICD-10-CM

## 2012-09-11 DIAGNOSIS — I5042 Chronic combined systolic (congestive) and diastolic (congestive) heart failure: Secondary | ICD-10-CM

## 2012-09-11 DIAGNOSIS — I509 Heart failure, unspecified: Secondary | ICD-10-CM

## 2012-09-11 DIAGNOSIS — I2510 Atherosclerotic heart disease of native coronary artery without angina pectoris: Secondary | ICD-10-CM

## 2012-09-11 DIAGNOSIS — I10 Essential (primary) hypertension: Secondary | ICD-10-CM

## 2012-09-11 DIAGNOSIS — N184 Chronic kidney disease, stage 4 (severe): Secondary | ICD-10-CM

## 2012-09-11 DIAGNOSIS — I25810 Atherosclerosis of coronary artery bypass graft(s) without angina pectoris: Secondary | ICD-10-CM

## 2012-09-11 LAB — BASIC METABOLIC PANEL
CO2: 21 mEq/L (ref 19–32)
Glucose, Bld: 107 mg/dL — ABNORMAL HIGH (ref 70–99)
Potassium: 5 mEq/L (ref 3.5–5.1)
Sodium: 135 mEq/L (ref 135–145)

## 2012-09-11 MED ORDER — BISOPROLOL FUMARATE 5 MG PO TABS: 7.5000 mg | ORAL_TABLET | Freq: Every day | ORAL | Status: DC

## 2012-09-11 NOTE — Patient Instructions (Addendum)
Reduce dose of Bisoprolol to 7.5 mg daily.  New prescription was sent to your pharmacy today. Weigh daily and call if:  Weight up 3 lbs in one day, 5 lbs in one week, increased swelling or increased dyspnea.    Your physician recommends that you schedule a follow-up appointment in: 11/07/12 @ 8:45 WITH DR. Excell Seltzer

## 2012-09-11 NOTE — Progress Notes (Signed)
[address] [place], [place]  [address] Phone: [phone] Fax:  [phone]  Date:  09/11/2012   Name:  [name]   DOB:  1930-10-11   MRN:  [id number]  PCP:  [name], MD  Primary Cardiologist:  Dr. Tonny Bollman  Primary Electrophysiologist:  None    History of Present Illness: [name] is a 78 y.o. female who returns for close followup.  She has a hx of atrial fibrillation, chronic combined systolic and diastolic CHF, stage 3-4 CKD, HTN, CAD, LBBB. She suffered a non-STEMI 7/13 treated with a DES to the OM. Severe residual disease in the left circumflex subbranch treated medically unless she has refractory angina. She was admitted in 9/13 with acute on chronic CHF in the setting of AFib with RVR. Previous EF in 7/13 was normal by echocardiogram. In 9/13, echo demonstrated an EF of 35%. She was placed on amiodarone. She was not placed on Coumadin secondary to ongoing DAPT. She had significant n/v on amiodarone and was readmitted.  Amiodarone was discontinued. She maintained NSR off of amiodarone. She was seen in the office in followup on 08/21/12 by [name], NP. Volume was stable at that time. She was off of Lasix at that time. ARB remained on hold.  Since being seen, she is doing well. She denies chest pain, shortness of breath, syncope, orthopnea, PND or significant pedal edema. She probably describes class II-IIb symptoms. She has seen her nephrologist.  Labs (08/21/12):  K 5.9, creatinine 2.4 Labs (08/23/12):  K 5.3, creatinine 2.5 Labs (08/31/12):  K 4.7, creatinine 2.1   Wt Readings from Last 3 Encounters:  08/27/12 128 lb (58.06 kg)  08/21/12 126 lb 12.8 oz (57.516 kg)  08/18/12 126 lb 14.4 oz (57.561 kg)     Past Medical History  Diagnosis Date  . Lower extremity edema     bilateral  . CKD (chronic kidney disease), stage III     a. baseline creat 1.6->2  . Labile hypertension   . Anxiety   . Failure to  thrive in childhood   . History of shingles July 2004    on the left side of her thorax and postherpetic neuralgia  . History of psoriasis   . History of recurrent UTIs   . Major depressive disorder, single episode, severe 25-Apr-2003    "when my husband died"  . Valvular heart disease     a. 07/2012 Echo in setting of afib: EF 35%, Mild AI/MR, Mod Dil LA, Mod TR,  . CAD (coronary artery disease)     a. NSTEMI 05/2012 s/p DES to OM 06/07/12 (residual severe LCx subbranch dz for med rx unless refractory angina recurs)  . LBBB (left bundle branch block)     intermittent  . Chronic combined systolic and diastolic CHF (congestive heart failure)     a. 05/2012 Echo: EF 60-65%, Gr 1 DD, mild AI/MR, Mod TR;  b. 07/2012 Echo in setting of afib: EF 35%, Mild AI/MR, Mod Dil LA, Mod TR, PASP .  . Atrial fibrillation     a. Dx 07/2012 -> Amio started.  No coumadin 2/2 ongoing DAPT;  b. 08/15/2012 Amio d/c 2/2 nausea - maintaining sinus rhythm.    Current Outpatient Prescriptions  Medication Sig Dispense Refill  . aspirin EC 81 MG tablet Take 1 tablet (81 mg total) by mouth daily.      Marland Kitchen b complex vitamins tablet Take 1 tablet by mouth daily.        Marland Kitchen  bisoprolol (ZEBETA) 10 MG tablet Take 1 tablet (10 mg total) by mouth daily.  30 tablet  6  . Cholecalciferol (VITAMIN D-3) 5000 UNITS TABS Take 1 tablet by mouth daily.      . Cinnamon 500 MG TABS Take 1 tablet by mouth 2 (two) times daily.       . clopidogrel (PLAVIX) 75 MG tablet Take 1 tablet (75 mg total) by mouth daily with breakfast.  30 tablet  11  . Cod Liver Oil CAPS Take 1 capsule by mouth daily.      . fish oil-omega-3 fatty acids 1000 MG capsule Take 1 g by mouth 2 (two) times daily.       Marland Kitchen HYDROcodone-acetaminophen (VICODIN) 5-500 MG per tablet Take 1 tablet by mouth every 6 (six) hours as needed. For pain  30 tablet  1  . isosorbide mononitrate (IMDUR) 30 MG 24 hr tablet Take 1 tablet (30 mg total) by mouth daily.  30 tablet  6  . Krill Oil  300 MG CAPS Take 1 capsule by mouth daily.       Marland Kitchen L-Lysine 500 MG CAPS Take 1 capsule by mouth 2 (two) times daily.      . nisoldipine (SULAR) 25.5 MG 24 hr tablet Take 1 tablet (25.5 mg total) by mouth daily.  30 tablet  0  . nitroGLYCERIN (NITROSTAT) 0.4 MG SL tablet Place 1 tablet (0.4 mg total) under the tongue every 5 (five) minutes x 3 doses as needed for chest pain.  25 tablet  4  . Oyster Shell (OYSTER CALCIUM) 500 MG TABS Take 500 mg of elemental calcium by mouth daily.      . vitamin C (ASCORBIC ACID) 500 MG tablet Take 500 mg by mouth daily.      . vitamin E 400 UNIT capsule Take 400 Units by mouth daily.          Allergies: Allergies  Allergen Reactions  . Metoprolol Swelling    Takes bisoprolol without issue  . Norvasc (Amlodipine Besylate) Swelling    Takes nisoldipine without issue  . Sulfonamide Derivatives Hives, Itching and Swelling  . Amiodarone Nausea And Vomiting  . Statins     Myalgias - "I won't take them"    History  Substance Use Topics  . Smoking status: Former Smoker -- 1.5 packs/day for 30 years    Types: Cigarettes    Quit date: 09/01/1979  . Smokeless tobacco: Never Used  . Alcohol Use: Yes     06/06/12 "mixed drink once or twice a year.to celebrate"     ROS:  Please see the history of present illness.  She does note some dizziness with standing at times. She also notes lack of energy.   All other systems reviewed and negative.   PHYSICAL EXAM: VS:  BP 154/78  Pulse 53  Ht 5\' 1"  (1.549 m)  Wt 131 lb 6.4 oz (59.603 kg)  BMI 24.83 kg/m2 Well nourished, well developed, in no acute distress HEENT: normal Neck: no JVD Cardiac:  normal S1, S2; RRR; no murmur Lungs:  clear to auscultation bilaterally, no wheezing, rhonchi or rales Abd: soft, nontender, no hepatomegaly Ext: no edema Skin: warm and dry Neuro:  CNs 2-12 intact, no focal abnormalities noted  EKG:  Sinus bradycardia, HR 58, nonspecific ST-T wave changes      ASSESSMENT AND  PLAN:  1. Atrial Fibrillation: She continues to maintain sinus rhythm without amiodarone. She does have bradycardia. She may be somewhat symptomatic with this  as she does get lightheaded at times. I have suggested that we decrease her bisoprolol to 7.5 mg daily. Hopefully this will help. Followup with Dr. Excell Seltzer in 4-6 weeks.  2. Chronic Combined Systolic and Diastolic CHF: Volume appears to be stable. Continue current therapy.  I will have her followup with Dr. Excell Seltzer in 4-6 weeks. She will likely need a followup echocardiogram at some point in the next 2-3 months to ensure recovery of her LV function.  3. Chronic Kidney Disease: Followed by Dr. Hyman Hopes. I will obtain a followup basic metabolic panel today.  4. Hypertension: As noted, I will adjust her bisoprolol. If her blood pressure remains elevated as it is today, we could likely add hydralazine to her medical regimen for better blood pressure control as well as treatment of her cardiomyopathy. As she has complained of some dizziness, I will not add hydralazine at this time.  5. Coronary Artery Disease: No angina. She remains on dual antiplatelet therapy.  SignedTereso Newcomer, PA-C  10:03 AM 09/11/2012

## 2012-09-12 NOTE — Addendum Note (Signed)
Addended by: Marrion Coy L on: 09/12/2012 03:35 PM   Modules accepted: Orders

## 2012-09-13 ENCOUNTER — Telehealth: Payer: Self-pay | Admitting: *Deleted

## 2012-09-13 NOTE — Telephone Encounter (Signed)
lmom labs ok, watch K+ rich foods still

## 2012-09-13 NOTE — Telephone Encounter (Signed)
Message copied by Tarri Fuller on Thu Sep 13, 2012 10:57 AM ------      Message from: [place], [place] T      Created: Tue Sep 11, 2012  4:51 PM       Creatinine stable.      K+ ok.  Continue to avoid K+ rich foods.      Fax copy of labs to nephrologist.      Tereso Newcomer, PA-C  4:51 PM 09/11/2012

## 2012-09-14 ENCOUNTER — Other Ambulatory Visit: Payer: Self-pay | Admitting: Cardiovascular Disease

## 2012-09-18 ENCOUNTER — Telehealth: Payer: Self-pay | Admitting: Family Medicine

## 2012-09-18 DIAGNOSIS — I5042 Chronic combined systolic (congestive) and diastolic (congestive) heart failure: Secondary | ICD-10-CM

## 2012-09-18 DIAGNOSIS — I2510 Atherosclerotic heart disease of native coronary artery without angina pectoris: Secondary | ICD-10-CM

## 2012-09-18 DIAGNOSIS — N184 Chronic kidney disease, stage 4 (severe): Secondary | ICD-10-CM

## 2012-09-18 DIAGNOSIS — I509 Heart failure, unspecified: Secondary | ICD-10-CM

## 2012-09-18 MED ORDER — NISOLDIPINE ER 25.5 MG PO TB24: 25.5000 mg | ORAL_TABLET | Freq: Every day | ORAL | Status: DC

## 2012-09-18 NOTE — Telephone Encounter (Signed)
Pt called and said that she has changed pharmacies to [facility] in [place]. Pt is req to get a refill nisoldipine (SULAR) 25.5 MG 24 hr tablet to [facility] in [place] [phone]. Pt said that this med was prescribed by a doctor from hospital. Pt is completely out of med. Pls call in today. Pt said that all future refills need to go to [facility] in [place].

## 2012-11-07 ENCOUNTER — Ambulatory Visit: Payer: Medicare Other | Admitting: Cardiovascular Disease

## 2012-11-13 ENCOUNTER — Ambulatory Visit (INDEPENDENT_AMBULATORY_CARE_PROVIDER_SITE_OTHER): Payer: Medicare Other | Admitting: Cardiovascular Disease

## 2012-11-13 ENCOUNTER — Encounter: Payer: Self-pay | Admitting: Cardiovascular Disease

## 2012-11-13 VITALS — BP 162/70 | HR 59 | Ht 61.0 in | Wt 134.0 lb

## 2012-11-13 DIAGNOSIS — I4891 Unspecified atrial fibrillation: Secondary | ICD-10-CM

## 2012-11-13 DIAGNOSIS — I10 Essential (primary) hypertension: Secondary | ICD-10-CM

## 2012-11-13 DIAGNOSIS — I509 Heart failure, unspecified: Secondary | ICD-10-CM

## 2012-11-13 DIAGNOSIS — I5042 Chronic combined systolic (congestive) and diastolic (congestive) heart failure: Secondary | ICD-10-CM

## 2012-11-13 MED ORDER — BISOPROLOL FUMARATE 5 MG PO TABS: 5.0000 mg | ORAL_TABLET | Freq: Every day | ORAL | Status: DC

## 2012-11-13 NOTE — Progress Notes (Signed)
HPI:  78 year old woman presenting for followup evaluation. She has a history of coronary artery disease and presented in July 2013 with a non-ST elevation infarction. She was treated with a drug-eluting stent to the OM branch. She was admitted again in September with acute diastolic heart failure in the setting of atrial fib with RVR. She was treated with amiodarone but was intolerant because of nausea and vomiting. She presents today for followup evaluation.  Overall she's feeling better than she has been earlier this fall. Her lightheadedness has improved since decreasing bisoprolol. She continues to feel "run down." She has some shortness of breath with exertion. She complains of leg swelling, but states she is unable to take diuretics because of her kidney disease. She denies orthopnea, PND, cough, or chest pain. She's compliant with her medications.  Outpatient Encounter Prescriptions as of 11/13/2012  Medication Sig Dispense Refill  . aspirin EC 81 MG tablet Take 1 tablet (81 mg total) by mouth daily.      Marland Kitchen b complex vitamins tablet Take 1 tablet by mouth daily.        . bisoprolol (ZEBETA) 5 MG tablet Take 1.5 tablets (7.5 mg total) by mouth daily.  45 tablet  6  . Cholecalciferol (VITAMIN D-3) 5000 UNITS TABS Take 1 tablet by mouth daily.      . Cinnamon 500 MG TABS Take 1 tablet by mouth 2 (two) times daily.       . clopidogrel (PLAVIX) 75 MG tablet Take 1 tablet (75 mg total) by mouth daily with breakfast.  30 tablet  11  . Cod Liver Oil CAPS Take 1 capsule by mouth daily.      . fish oil-omega-3 fatty acids 1000 MG capsule Take 1 g by mouth 2 (two) times daily.       Marland Kitchen HYDROcodone-acetaminophen (VICODIN) 5-500 MG per tablet Take 1 tablet by mouth every 6 (six) hours as needed. For pain  30 tablet  1  . isosorbide mononitrate (IMDUR) 30 MG 24 hr tablet Take 1 tablet (30 mg total) by mouth daily.  30 tablet  6  . Krill Oil 300 MG CAPS Take 1 capsule by mouth daily.       Marland Kitchen L-Lysine  500 MG CAPS Take 1 capsule by mouth 2 (two) times daily.      . nisoldipine (SULAR) 25.5 MG 24 hr tablet Take 1 tablet (25.5 mg total) by mouth daily.  30 tablet  2  . nitroGLYCERIN (NITROSTAT) 0.4 MG SL tablet Place 1 tablet (0.4 mg total) under the tongue every 5 (five) minutes x 3 doses as needed for chest pain.  25 tablet  4  . Oyster Shell (OYSTER CALCIUM) 500 MG TABS Take 500 mg of elemental calcium by mouth daily.      . vitamin C (ASCORBIC ACID) 500 MG tablet Take 500 mg by mouth daily.      . vitamin E 400 UNIT capsule Take 400 Units by mouth daily.          Allergies  Allergen Reactions  . Metoprolol Swelling    Takes bisoprolol without issue  . Norvasc (Amlodipine Besylate) Swelling    Takes nisoldipine without issue  . Sulfonamide Derivatives Hives, Itching and Swelling  . Amiodarone Nausea And Vomiting  . Statins     Myalgias - "I won't take them"    Past Medical History  Diagnosis Date  . Lower extremity edema     bilateral  . CKD (chronic kidney disease), stage  III     a. baseline creat 1.6->2  . Labile hypertension   . Anxiety   . Failure to thrive in childhood   . History of shingles July 2004    on the left side of her thorax and postherpetic neuralgia  . History of psoriasis   . History of recurrent UTIs   . Major depressive disorder, single episode, severe 05-05-03    "when my husband died"  . Valvular heart disease     a. 07/2012 Echo in setting of afib: EF 35%, Mild AI/MR, Mod Dil LA, Mod TR,  . CAD (coronary artery disease)     a. NSTEMI 05/2012 s/p DES to OM 06/07/12 (residual severe LCx subbranch dz for med rx unless refractory angina recurs)  . LBBB (left bundle branch block)     intermittent  . Chronic combined systolic and diastolic CHF (congestive heart failure)     a. 05/2012 Echo: EF 60-65%, Gr 1 DD, mild AI/MR, Mod TR;  b. 07/2012 Echo in setting of afib: EF 35%, Mild AI/MR, Mod Dil LA, Mod TR, PASP .  . Atrial fibrillation     a. Dx 07/2012 ->  Amio started.  No coumadin 2/2 ongoing DAPT;  b. 08/15/2012 Amio d/c 2/2 nausea - maintaining sinus rhythm.    ROS: Negative except as per HPI  BP 162/70  Pulse 59  Ht 5\' 1"  (1.549 m)  Wt 60.782 kg (134 lb)  BMI 25.32 kg/m2  SpO2 97%  PHYSICAL EXAM: Pt is alert and oriented, NAD HEENT: normal Neck: JVP - normal, carotids 2+= without bruits Lungs: CTA bilaterally CV: RRR with grade 2/6 systolic murmur at the upper sternal border Abd: soft, NT, Positive BS, no hepatomegaly Ext: no C/C/E, distal pulses intact and equal Skin: warm/dry no rash  ASSESSMENT AND PLAN: 1. Coronary artery disease, native vessel. The patient will continue on a combination of aspirin and Plavix. She is having no anginal symptoms. I would like to see her back in 3 months.  2. Chronic systolic heart failure. I suspect this is playing a role in her generalized fatigue and shortness of breath. She previously has had normal LV function, but has developed left ventricular dysfunction in the setting of atrial fibrillation. Now that she is back in sinus rhythm I would like to repeat her echocardiogram. She's not a candidate for an ACE inhibitor because of advanced chronic kidney disease with creatinine clearance less than 30.  3. Paroxysmal atrial fibrillation. She's maintaining sinus rhythm. Because of her generalized fatigue I am going to reduce her bisoprolol 5 mg daily. She's treated with a combination of aspirin and Plavix in the setting of coronary disease and high bleeding risk.  4. Hypertension. Home blood pressures are running in the 120s over 50s. She has long-standing whitecoat hypertension. She tells me that she generally feels better when her systolics are in the range of 140. Plan to reduce bisoprolol as above.  For followup I would like to see her back in 3 months.  Tonny Bollman 11/13/2012 2:13 PM

## 2012-11-13 NOTE — Patient Instructions (Addendum)
Please decrease your Bisoprolol to 5 mg a day Continue all other medications as listed  Your physician has requested that you have an echocardiogram. Echocardiography is a painless test that uses sound waves to create images of your heart. It provides your doctor with information about the size and shape of your heart and how well your heart's chambers and valves are working. This procedure takes approximately one hour. There are no restrictions for this procedure.  Follow up with Dr Excell Seltzer in 3 months.

## 2012-12-06 ENCOUNTER — Ambulatory Visit (HOSPITAL_COMMUNITY): Payer: Medicare Other | Attending: Cardiology | Admitting: Radiology

## 2012-12-06 DIAGNOSIS — I379 Nonrheumatic pulmonary valve disorder, unspecified: Secondary | ICD-10-CM | POA: Insufficient documentation

## 2012-12-06 DIAGNOSIS — I509 Heart failure, unspecified: Secondary | ICD-10-CM | POA: Insufficient documentation

## 2012-12-06 DIAGNOSIS — N189 Chronic kidney disease, unspecified: Secondary | ICD-10-CM | POA: Insufficient documentation

## 2012-12-06 DIAGNOSIS — I4891 Unspecified atrial fibrillation: Secondary | ICD-10-CM | POA: Insufficient documentation

## 2012-12-06 DIAGNOSIS — I129 Hypertensive chronic kidney disease with stage 1 through stage 4 chronic kidney disease, or unspecified chronic kidney disease: Secondary | ICD-10-CM | POA: Insufficient documentation

## 2012-12-06 DIAGNOSIS — I359 Nonrheumatic aortic valve disorder, unspecified: Secondary | ICD-10-CM

## 2012-12-06 DIAGNOSIS — I5042 Chronic combined systolic (congestive) and diastolic (congestive) heart failure: Secondary | ICD-10-CM

## 2012-12-06 DIAGNOSIS — I080 Rheumatic disorders of both mitral and aortic valves: Secondary | ICD-10-CM | POA: Insufficient documentation

## 2012-12-06 DIAGNOSIS — I369 Nonrheumatic tricuspid valve disorder, unspecified: Secondary | ICD-10-CM | POA: Insufficient documentation

## 2012-12-06 DIAGNOSIS — I2510 Atherosclerotic heart disease of native coronary artery without angina pectoris: Secondary | ICD-10-CM | POA: Insufficient documentation

## 2012-12-06 NOTE — Progress Notes (Signed)
Echocardiogram performed.  

## 2012-12-11 ENCOUNTER — Other Ambulatory Visit: Payer: Self-pay | Admitting: Family Medicine

## 2013-01-03 ENCOUNTER — Other Ambulatory Visit: Payer: Self-pay

## 2013-01-03 DIAGNOSIS — I10 Essential (primary) hypertension: Secondary | ICD-10-CM

## 2013-01-03 DIAGNOSIS — I4891 Unspecified atrial fibrillation: Secondary | ICD-10-CM

## 2013-01-03 DIAGNOSIS — I5042 Chronic combined systolic (congestive) and diastolic (congestive) heart failure: Secondary | ICD-10-CM

## 2013-01-09 ENCOUNTER — Telehealth: Payer: Self-pay | Admitting: Family Medicine

## 2013-01-09 MED ORDER — ISOSORBIDE MONONITRATE ER 30 MG PO TB24: 30.0000 mg | ORAL_TABLET | Freq: Every day | ORAL | Status: DC

## 2013-01-09 NOTE — Telephone Encounter (Signed)
Pt needs refill of  :isosorbide mononitrate (IMDUR) 30 MG 24 hr tablet Pt aware she needs office visit, made appt 01/18/13.   Pharm: [facility] [place] [facility]

## 2013-01-18 ENCOUNTER — Ambulatory Visit: Payer: Medicare Other | Admitting: Family Medicine

## 2013-01-23 ENCOUNTER — Encounter: Payer: Self-pay | Admitting: Family Medicine

## 2013-01-23 ENCOUNTER — Ambulatory Visit (INDEPENDENT_AMBULATORY_CARE_PROVIDER_SITE_OTHER): Payer: Medicare Other | Admitting: Family Medicine

## 2013-01-23 VITALS — BP 120/60 | Temp 97.7°F | Wt 135.0 lb

## 2013-01-23 DIAGNOSIS — I10 Essential (primary) hypertension: Secondary | ICD-10-CM

## 2013-01-23 DIAGNOSIS — N184 Chronic kidney disease, stage 4 (severe): Secondary | ICD-10-CM

## 2013-01-23 DIAGNOSIS — I4891 Unspecified atrial fibrillation: Secondary | ICD-10-CM

## 2013-01-23 MED ORDER — FELODIPINE ER 5 MG PO TB24: 5.0000 mg | ORAL_TABLET | Freq: Every day | ORAL | Status: DC

## 2013-01-23 NOTE — Patient Instructions (Signed)
Stop Sular. Start Felodipine one daily and be in touch for any increased swelling or other difficulties.

## 2013-01-23 NOTE — Progress Notes (Signed)
Subjective:    Patient ID: [name], female    DOB: [birth year], 79 y.o.   MRN: [id number]  HPI Patient seen for medical followup. She has history of CAD, hyperlipidemia, diastolic heart failure, atrial fibrillation and chronic kidney disease. Last July she presented with non-ST elevation MI. She had stent obtuse marginal branch. In September presented with acute diastolic heart failure and atrial fibrillation. She did not tolerate amiodarone. She is now maintained metoprolol for rate control and takes Plavix and aspirin. She has chronic kidney disease with creatinine around 2.5 with close followup with nephrology and reportedly had basic metabolic panel early February.  Major complaint is $230 per month for Sular. Previous edema with amlodipine. She does not recall trying felodipine. No ACE inhibitor use because of her GFR less than 30.  Past Medical History  Diagnosis Date  . Lower extremity edema     bilateral  . CKD (chronic kidney disease), stage III     a. baseline creat 1.6->2  . Labile hypertension   . Anxiety   . Failure to thrive in childhood   . History of shingles July 2004    on the left side of her thorax and postherpetic neuralgia  . History of psoriasis   . History of recurrent UTIs   . Major depressive disorder, single episode, severe 05-05-2003    "when my husband died"  . Valvular heart disease     a. 07/2012 Echo in setting of afib: EF 35%, Mild AI/MR, Mod Dil LA, Mod TR,  . CAD (coronary artery disease)     a. NSTEMI 05/2012 s/p DES to OM 06/07/12 (residual severe LCx subbranch dz for med rx unless refractory angina recurs)  . LBBB (left bundle branch block)     intermittent  . Chronic combined systolic and diastolic CHF (congestive heart failure)     a. 05/2012 Echo: EF 60-65%, Gr 1 DD, mild AI/MR, Mod TR;  b. 07/2012 Echo in setting of afib: EF 35%, Mild AI/MR, Mod Dil LA, Mod TR, PASP .  . Atrial fibrillation     a. Dx 07/2012 -> Amio started.  No coumadin  2/2 ongoing DAPT;  b. 08/15/2012 Amio d/c 2/2 nausea - maintaining sinus rhythm.   Past Surgical History  Procedure Laterality Date  . Salpingoophorectomy      "cyst removed"  . Appendectomy    . Breast cyst excision    . Abdominal hysterectomy    . Cataract extraction, bilateral  ~ 05-04-09  . Cardiac surgery      reports that she quit smoking about 33 years ago. Her smoking use included Cigarettes. She has a 45 pack-year smoking history. She has never used smokeless tobacco. She reports that  drinks alcohol. She reports that she does not use illicit drugs. family history includes Cancer in an unspecified family member; Diabetes in an unspecified family member; and Lung cancer in an unspecified family member. Allergies  Allergen Reactions  . Metoprolol Swelling    Takes bisoprolol without issue  . Norvasc (Amlodipine Besylate) Swelling    Takes nisoldipine without issue  . Sulfonamide Derivatives Hives, Itching and Swelling  . Amiodarone Nausea And Vomiting  . Statins     Myalgias - "I won't take them"      Review of Systems  Constitutional: Negative for fatigue.  Eyes: Negative for visual disturbance.  Respiratory: Negative for cough, chest tightness, shortness of breath and wheezing.   Cardiovascular: Negative for chest pain, palpitations and leg swelling.  Neurological:  Negative for dizziness, seizures, syncope, weakness, light-headedness and headaches.       Objective:   Physical Exam  Constitutional: She appears well-developed and well-nourished. No distress.  Cardiovascular: Normal rate.   Pulmonary/Chest: Effort normal and breath sounds normal. No respiratory distress. She has no wheezes. She has no rales.  Musculoskeletal: She exhibits no edema.          Assessment & Plan:  #1 hypertension. Stable. Discussed medication options. We discussed trial of Plendil but explained this could cause edema just as amlodipine did. Start 5 mg daily and she'll be in touch with  any peripheral edema issues #2 atrial fibrillation rate controlled  #3 chronic kidney disease

## 2013-02-08 ENCOUNTER — Other Ambulatory Visit: Payer: Self-pay | Admitting: Family Medicine

## 2013-02-19 ENCOUNTER — Other Ambulatory Visit (HOSPITAL_COMMUNITY): Payer: Medicare Other

## 2013-02-19 ENCOUNTER — Ambulatory Visit: Payer: Medicare Other | Admitting: Cardiovascular Disease

## 2013-03-12 ENCOUNTER — Ambulatory Visit (HOSPITAL_COMMUNITY): Payer: Medicare Other | Attending: Cardiology | Admitting: Radiology

## 2013-03-12 ENCOUNTER — Encounter: Payer: Self-pay | Admitting: Cardiovascular Disease

## 2013-03-12 ENCOUNTER — Ambulatory Visit (INDEPENDENT_AMBULATORY_CARE_PROVIDER_SITE_OTHER): Payer: Medicare Other | Admitting: Cardiovascular Disease

## 2013-03-12 VITALS — BP 153/57 | HR 64 | Ht 61.0 in | Wt 135.0 lb

## 2013-03-12 DIAGNOSIS — R9389 Abnormal findings on diagnostic imaging of other specified body structures: Secondary | ICD-10-CM | POA: Insufficient documentation

## 2013-03-12 DIAGNOSIS — I4891 Unspecified atrial fibrillation: Secondary | ICD-10-CM

## 2013-03-12 DIAGNOSIS — I2510 Atherosclerotic heart disease of native coronary artery without angina pectoris: Secondary | ICD-10-CM | POA: Insufficient documentation

## 2013-03-12 DIAGNOSIS — I5042 Chronic combined systolic (congestive) and diastolic (congestive) heart failure: Secondary | ICD-10-CM

## 2013-03-12 DIAGNOSIS — I10 Essential (primary) hypertension: Secondary | ICD-10-CM

## 2013-03-12 NOTE — Progress Notes (Signed)
HPI:  79 year old woman presenting for followup evaluation. She has coronary artery disease and underwent PCI of the left circumflex and July 2013 after presenting with a non-ST elevation infarction. A drug-eluting stent platform was utilized. She subsequently had diastolic heart failure in the setting of atrial fib with RVR. She was hospitalized on 2 other occasions. Because of frailty, chronic kidney disease, and recent stenting, she was continued on dual antiplatelet therapy with aspirin and Plavix without initiation of oral anticoagulation. She has had no further atrial fibrillation or heart failure symptoms. She has fairly advanced chronic kidney disease and is followed by Dr. Hyman Hopes. She tells me that she is not interested in pursuing hemodialysis if her kidneys fail. She would like to be considered for kidney transplantation but has been told that she is not a candidate.  She actually is feeling quite well. She was out on her tractor mowing her lawn over the weekend. She was able to work for about 2 hours. She denies shortness of breath, chest pain or pressure, edema, orthopnea, palpitations, or PND. She denies any bleeding problems. She's had no stroke or TIA symptoms.  Outpatient Encounter Prescriptions as of 03/12/2013  Medication Sig Dispense Refill  . aspirin EC 81 MG tablet Take 1 tablet (81 mg total) by mouth daily.      Marland Kitchen b complex vitamins tablet Take 1 tablet by mouth daily.        . bisoprolol (ZEBETA) 5 MG tablet Take 1 tablet (5 mg total) by mouth daily.  30 tablet  6  . Cholecalciferol (VITAMIN D-3) 5000 UNITS TABS Take 1 tablet by mouth daily.      . clopidogrel (PLAVIX) 75 MG tablet TAKE 1 TABLET BY MOUTH EVERY DAY WITH BREAKFAST  30 tablet  11  . felodipine (PLENDIL) 5 MG 24 hr tablet Take 1 tablet (5 mg total) by mouth daily.  30 tablet  5  . fish oil-omega-3 fatty acids 1000 MG capsule Take 1 g by mouth 2 (two) times daily.       Marland Kitchen HYDROcodone-acetaminophen (VICODIN) 5-500 MG  per tablet Take 1 tablet by mouth every 6 (six) hours as needed. For pain  30 tablet  1  . isosorbide mononitrate (IMDUR) 30 MG 24 hr tablet TAKE 1 TABLET OP EVERY DAY  30 tablet  11  . Krill Oil 300 MG CAPS Take 1 capsule by mouth daily.       Marland Kitchen L-Lysine 500 MG CAPS Take 1 capsule by mouth 2 (two) times daily.      . nitroGLYCERIN (NITROSTAT) 0.4 MG SL tablet Place 1 tablet (0.4 mg total) under the tongue every 5 (five) minutes x 3 doses as needed for chest pain.  25 tablet  4  . [DISCONTINUED] Oyster Shell (OYSTER CALCIUM) 500 MG TABS Take 500 mg of elemental calcium by mouth daily.       No facility-administered encounter medications on file as of 03/12/2013.    Allergies  Allergen Reactions  . Metoprolol Swelling    Takes bisoprolol without issue  . Norvasc (Amlodipine Besylate) Swelling    Takes nisoldipine without issue  . Sulfonamide Derivatives Hives, Itching and Swelling  . Amiodarone Nausea And Vomiting  . Statins     Myalgias - "I won't take them"    Past Medical History  Diagnosis Date  . Lower extremity edema     bilateral  . CKD (chronic kidney disease), stage III     a. baseline creat 1.6->2  . Labile  hypertension   . Anxiety   . Failure to thrive in childhood   . History of shingles July 2004    on the left side of her thorax and postherpetic neuralgia  . History of psoriasis   . History of recurrent UTIs   . Major depressive disorder, single episode, severe Apr 26, 2003    "when my husband died"  . Valvular heart disease     a. 07/2012 Echo in setting of afib: EF 35%, Mild AI/MR, Mod Dil LA, Mod TR,  . CAD (coronary artery disease)     a. NSTEMI 05/2012 s/p DES to OM 06/07/12 (residual severe LCx subbranch dz for med rx unless refractory angina recurs)  . LBBB (left bundle branch block)     intermittent  . Chronic combined systolic and diastolic CHF (congestive heart failure)     a. 05/2012 Echo: EF 60-65%, Gr 1 DD, mild AI/MR, Mod TR;  b. 07/2012 Echo in setting of  afib: EF 35%, Mild AI/MR, Mod Dil LA, Mod TR, PASP .  . Atrial fibrillation     a. Dx 07/2012 -> Amio started.  No coumadin 2/2 ongoing DAPT;  b. 08/15/2012 Amio d/c 2/2 nausea - maintaining sinus rhythm.   ROS: Negative except as per HPI  BP 153/57  Pulse 64  Ht 5\' 1"  (1.549 m)  Wt 61.236 kg (135 lb)  BMI 25.52 kg/m2  PHYSICAL EXAM: Pt is alert and oriented, pleasant elderly woman in NAD HEENT: normal Neck: JVP - normal, carotids 2+= with bilateral bruits Lungs: CTA bilaterally CV: RRR with grade 2/6 systolic murmur at the left sternal border Abd: soft, NT, Positive BS, no hepatomegaly Ext: no C/C/E, distal pulses intact and equal Skin: warm/dry no rash  2D Echo: pending  ASSESSMENT AND PLAN: 1. Coronary artery disease, native vessel. The patient is stable without anginal symptoms. She will continue on dual antiplatelet therapy with aspirin and Plavix. I would like to see her back in 6 months.  2. Chronic diastolic heart failure. Repeat echocardiogram today is pending. She is not a candidate for an ACE inhibitor or ARB because of stage IV to 5 chronic kidney disease. She will continue her current medications. She appears euvolemic today and has a good functional capacity.  3. Paroxysmal atrial fibrillation. She continues to maintain sinus rhythm. She is treated with aspirin and Plavix after coronary stenting. She is far enough out that if she develops recurrent atrial fib will consider initiation of warfarin.  4. Hypertension. Blood pressure is well controlled.  5. Questionable cardiac mass. Previous echocardiogram raised the question of a right atrial mass. I reviewed the images and felt this had some characteristics of a persistent eustachian valve/Chiari network. Repeat study today and will compared to previous studies.  Tonny Bollman 03/12/2013 10:27 AM

## 2013-03-12 NOTE — Progress Notes (Signed)
Limited Echocardiogram performed.  

## 2013-03-12 NOTE — Patient Instructions (Addendum)
Your physician wants you to follow-up in:  6 months. You will receive a reminder letter in the mail two months in advance. If you don't receive a letter, please call our office to schedule the follow-up appointment.   

## 2013-03-15 ENCOUNTER — Other Ambulatory Visit: Payer: Self-pay

## 2013-03-15 ENCOUNTER — Emergency Department (HOSPITAL_COMMUNITY): Payer: Medicare Other

## 2013-03-15 ENCOUNTER — Inpatient Hospital Stay (HOSPITAL_COMMUNITY)
Admission: EM | Admit: 2013-03-15 | Discharge: 2013-03-22 | DRG: 308 | Disposition: A | Discharge status: Home / Self Care | Payer: Medicare Other | Attending: Cardiovascular Disease | Admitting: Cardiovascular Disease

## 2013-03-15 ENCOUNTER — Encounter (HOSPITAL_COMMUNITY): Payer: Self-pay | Admitting: Emergency Medicine

## 2013-03-15 DIAGNOSIS — J9600 Acute respiratory failure, unspecified whether with hypoxia or hypercapnia: Secondary | ICD-10-CM | POA: Diagnosis present

## 2013-03-15 DIAGNOSIS — I4891 Unspecified atrial fibrillation: Principal | ICD-10-CM | POA: Diagnosis present

## 2013-03-15 DIAGNOSIS — N184 Chronic kidney disease, stage 4 (severe): Secondary | ICD-10-CM

## 2013-03-15 DIAGNOSIS — I5043 Acute on chronic combined systolic (congestive) and diastolic (congestive) heart failure: Secondary | ICD-10-CM

## 2013-03-15 DIAGNOSIS — I248 Other forms of acute ischemic heart disease: Secondary | ICD-10-CM | POA: Diagnosis present

## 2013-03-15 DIAGNOSIS — I252 Old myocardial infarction: Secondary | ICD-10-CM

## 2013-03-15 DIAGNOSIS — Z9861 Coronary angioplasty status: Secondary | ICD-10-CM

## 2013-03-15 DIAGNOSIS — I2510 Atherosclerotic heart disease of native coronary artery without angina pectoris: Secondary | ICD-10-CM

## 2013-03-15 DIAGNOSIS — R079 Chest pain, unspecified: Secondary | ICD-10-CM

## 2013-03-15 DIAGNOSIS — M7989 Other specified soft tissue disorders: Secondary | ICD-10-CM

## 2013-03-15 DIAGNOSIS — Z87891 Personal history of nicotine dependence: Secondary | ICD-10-CM

## 2013-03-15 DIAGNOSIS — N185 Chronic kidney disease, stage 5: Secondary | ICD-10-CM | POA: Diagnosis present

## 2013-03-15 DIAGNOSIS — I5033 Acute on chronic diastolic (congestive) heart failure: Secondary | ICD-10-CM

## 2013-03-15 DIAGNOSIS — I509 Heart failure, unspecified: Secondary | ICD-10-CM | POA: Diagnosis present

## 2013-03-15 DIAGNOSIS — I120 Hypertensive chronic kidney disease with stage 5 chronic kidney disease or end stage renal disease: Secondary | ICD-10-CM | POA: Diagnosis present

## 2013-03-15 DIAGNOSIS — I200 Unstable angina: Secondary | ICD-10-CM

## 2013-03-15 DIAGNOSIS — I5021 Acute systolic (congestive) heart failure: Secondary | ICD-10-CM

## 2013-03-15 DIAGNOSIS — I2489 Other forms of acute ischemic heart disease: Secondary | ICD-10-CM | POA: Diagnosis present

## 2013-03-15 DIAGNOSIS — N189 Chronic kidney disease, unspecified: Secondary | ICD-10-CM | POA: Diagnosis present

## 2013-03-15 DIAGNOSIS — N289 Disorder of kidney and ureter, unspecified: Secondary | ICD-10-CM

## 2013-03-15 DIAGNOSIS — E785 Hyperlipidemia, unspecified: Secondary | ICD-10-CM

## 2013-03-15 LAB — CBC WITH DIFFERENTIAL/PLATELET
Basophils Relative: 1 % (ref 0–1)
Eosinophils Absolute: 0.3 10*3/uL (ref 0.0–0.7)
HCT: 35.6 % — ABNORMAL LOW (ref 36.0–46.0)
Hemoglobin: 12.6 g/dL (ref 12.0–15.0)
MCH: 30.1 pg (ref 26.0–34.0)
MCHC: 35.4 g/dL (ref 30.0–36.0)
Monocytes Absolute: 1 10*3/uL (ref 0.1–1.0)
Monocytes Relative: 12 % (ref 3–12)

## 2013-03-15 LAB — PROTIME-INR: INR: 1.01 (ref 0.00–1.49)

## 2013-03-15 LAB — POCT I-STAT, CHEM 8
BUN: 29 mg/dL — ABNORMAL HIGH (ref 6–23)
Creatinine, Ser: 2.1 mg/dL — ABNORMAL HIGH (ref 0.50–1.10)
Glucose, Bld: 111 mg/dL — ABNORMAL HIGH (ref 70–99)
Hemoglobin: 12.9 g/dL (ref 12.0–15.0)
Potassium: 3.5 mEq/L (ref 3.5–5.1)
TCO2: 25 mmol/L (ref 0–100)

## 2013-03-15 LAB — TROPONIN I: Troponin I: 1.06 ng/mL (ref <0.30)

## 2013-03-15 LAB — MAGNESIUM: Magnesium: 2.1 mg/dL (ref 1.5–2.5)

## 2013-03-15 LAB — POCT I-STAT TROPONIN I

## 2013-03-15 MED ORDER — SODIUM CHLORIDE 0.9 % IJ SOLN
3.0000 mL | Freq: Two times a day (BID) | INTRAMUSCULAR | Status: DC
  Administered 2013-03-16 – 2013-03-18 (×5): 3 mL via INTRAVENOUS

## 2013-03-15 MED ORDER — SODIUM CHLORIDE 0.9 % IV SOLN
250.0000 mL | INTRAVENOUS | Status: DC | PRN
  Administered 2013-03-16 – 2013-03-17 (×2): 250 mL via INTRAVENOUS

## 2013-03-15 MED ORDER — HEPARIN (PORCINE) IN NACL 100-0.45 UNIT/ML-% IJ SOLN
1100.0000 [IU]/h | INTRAMUSCULAR | Status: DC
  Administered 2013-03-15 – 2013-03-16 (×2): 750 [IU]/h via INTRAVENOUS
  Administered 2013-03-17 – 2013-03-21 (×4): 950 [IU]/h via INTRAVENOUS
  Administered 2013-03-22: 1100 [IU]/h via INTRAVENOUS
  Filled 2013-03-15 (×10): qty 250

## 2013-03-15 MED ORDER — NITROGLYCERIN 0.4 MG SL SUBL: 0.4000 mg | SUBLINGUAL_TABLET | SUBLINGUAL | Status: DC | PRN

## 2013-03-15 MED ORDER — HEPARIN BOLUS VIA INFUSION
3000.0000 [IU] | Freq: Once | INTRAVENOUS | Status: AC
  Administered 2013-03-15: 3000 [IU] via INTRAVENOUS

## 2013-03-15 MED ORDER — ASPIRIN EC 81 MG PO TBEC
81.0000 mg | DELAYED_RELEASE_TABLET | Freq: Every day | ORAL | Status: DC
  Administered 2013-03-16 – 2013-03-22 (×7): 81 mg via ORAL
  Filled 2013-03-15 (×8): qty 1

## 2013-03-15 MED ORDER — LORATADINE 10 MG PO TABS
10.0000 mg | ORAL_TABLET | Freq: Every day | ORAL | Status: DC
  Administered 2013-03-16 – 2013-03-22 (×6): 10 mg via ORAL
  Filled 2013-03-15 (×8): qty 1

## 2013-03-15 MED ORDER — DILTIAZEM HCL 100 MG IV SOLR
5.0000 mg/h | Freq: Once | INTRAVENOUS | Status: AC
  Administered 2013-03-15: 5 mg/h via INTRAVENOUS

## 2013-03-15 MED ORDER — DILTIAZEM HCL 25 MG/5ML IV SOLN
10.0000 mg | Freq: Once | INTRAVENOUS | Status: AC
  Administered 2013-03-15: 10 mg via INTRAVENOUS
  Filled 2013-03-15: qty 5

## 2013-03-15 MED ORDER — ASPIRIN 81 MG PO CHEW
324.0000 mg | CHEWABLE_TABLET | Freq: Once | ORAL | Status: AC
  Administered 2013-03-15: 324 mg via ORAL
  Filled 2013-03-15: qty 4

## 2013-03-15 MED ORDER — HEPARIN (PORCINE) IN NACL 100-0.45 UNIT/ML-% IJ SOLN: 12.0000 [IU]/kg/h | INTRAMUSCULAR | Status: DC

## 2013-03-15 MED ORDER — ONDANSETRON HCL 4 MG/2ML IJ SOLN
4.0000 mg | Freq: Four times a day (QID) | INTRAMUSCULAR | Status: DC | PRN
  Administered 2013-03-16 – 2013-03-17 (×2): 4 mg via INTRAVENOUS
  Filled 2013-03-15 (×2): qty 2

## 2013-03-15 MED ORDER — SODIUM CHLORIDE 0.9 % IV SOLN
Freq: Once | INTRAVENOUS | Status: AC
  Administered 2013-03-15: 16:00:00 via INTRAVENOUS

## 2013-03-15 MED ORDER — ISOSORBIDE MONONITRATE ER 30 MG PO TB24
30.0000 mg | ORAL_TABLET | Freq: Every day | ORAL | Status: DC
  Administered 2013-03-16 – 2013-03-22 (×7): 30 mg via ORAL
  Filled 2013-03-15 (×8): qty 1

## 2013-03-15 MED ORDER — ACETAMINOPHEN 325 MG PO TABS: 650.0000 mg | ORAL_TABLET | ORAL | Status: DC | PRN

## 2013-03-15 MED ORDER — METOPROLOL TARTRATE 1 MG/ML IV SOLN: 2.5000 mg | Freq: Once | INTRAVENOUS | Status: DC

## 2013-03-15 MED ORDER — SODIUM CHLORIDE 0.9 % IJ SOLN: 3.0000 mL | INTRAMUSCULAR | Status: DC | PRN

## 2013-03-15 MED ORDER — CLOPIDOGREL BISULFATE 75 MG PO TABS
75.0000 mg | ORAL_TABLET | Freq: Every day | ORAL | Status: DC
  Administered 2013-03-15 – 2013-03-18 (×4): 75 mg via ORAL
  Filled 2013-03-15 (×4): qty 1

## 2013-03-15 MED ORDER — ALPRAZOLAM 0.25 MG PO TABS: 0.2500 mg | ORAL_TABLET | Freq: Two times a day (BID) | ORAL | Status: DC | PRN

## 2013-03-15 MED ORDER — BISOPROLOL FUMARATE 5 MG PO TABS
5.0000 mg | ORAL_TABLET | Freq: Every day | ORAL | Status: DC
  Filled 2013-03-15 (×2): qty 1

## 2013-03-15 MED ORDER — DILTIAZEM HCL 100 MG IV SOLR
5.0000 mg/h | INTRAVENOUS | Status: DC
  Filled 2013-03-15: qty 100

## 2013-03-15 NOTE — Progress Notes (Signed)
ANTICOAGULATION CONSULT NOTE - Initial Consult  Pharmacy Consult for Heparin Indication: atrial fibrillation  Allergies  Allergen Reactions  . Metoprolol Swelling    Takes bisoprolol without issue  . Norvasc (Amlodipine Besylate) Swelling    Takes nisoldipine without issue  . Sulfonamide Derivatives Hives, Itching and Swelling  . Amiodarone Nausea And Vomiting  . Statins     Myalgias - "I won't take them"    Patient Measurements:   Heparin Dosing Weight: 60 kg  Vital Signs: Temp: 97.6 F (36.4 C) (04/18 1521) Temp src: Oral (04/18 1521) BP: 99/59 mmHg (04/18 1645) Pulse Rate: 66 (04/18 1645)  Labs:  Recent Labs  03/15/13 1542 03/15/13 1614  HGB 12.6 12.9  HCT 35.6* 38.0  PLT 288  --   APTT 29  --   LABPROT 13.2  --   INR 1.01  --   CREATININE  --  2.10*    The CrCl is unknown because both a height and weight (above a minimum accepted value) are required for this calculation.   Medical History: Past Medical History  Diagnosis Date  . Lower extremity edema     bilateral  . CKD (chronic kidney disease), stage III     a. baseline creat 1.6->2  . Labile hypertension   . Anxiety   . Failure to thrive in childhood   . History of shingles July 2004    on the left side of her thorax and postherpetic neuralgia  . History of psoriasis   . History of recurrent UTIs   . Major depressive disorder, single episode, severe 2003-04-14    "when my husband died"  . Valvular heart disease     a. 07/2012 Echo in setting of afib: EF 35%, Mild AI/MR, Mod Dil LA, Mod TR,  . CAD (coronary artery disease)     a. NSTEMI 05/2012 s/p DES to OM 06/07/12 (residual severe LCx subbranch dz for med rx unless refractory angina recurs)  . LBBB (left bundle branch block)     intermittent  . Chronic combined systolic and diastolic CHF (congestive heart failure)     a. 05/2012 Echo: EF 60-65%, Gr 1 DD, mild AI/MR, Mod TR;  b. 07/2012 Echo in setting of afib: EF 35%, Mild AI/MR, Mod Dil LA, Mod TR,  PASP .  . Atrial fibrillation     a. Dx 07/2012 -> Amio started.  No coumadin 2/2 ongoing DAPT;  b. 08/15/2012 Amio d/c 2/2 nausea - maintaining sinus rhythm.    Medications: ASA 81mg , BComplex, bisoprolol, Vit D, Plavix, Plendil, Fish Oil, Imdur, Krill Oil, L-Lysine, Claritin, NTG SL  Assessment: Palpitations, CP.  79 y/o F with known CAD (PCI to L Cfx 7/13), dCHF, Afib. Previously deemed not a good Coumadin candidate due to frailty, chronic kidney disease, and stents. Pt found today to be in afib with RVR also concerning for ACS due to throat and arm pain (resolved with NTG). EKG without changes. Troponin 0.12 borderline.  Plan to start IV heparin for afib and and CP/ACS. Baseline CBC WNL. No bleeding complcations noted.   Goal of Therapy:  Heparin level 0.3-0.7 units/ml Monitor platelets by anticoagulation protocol: Yes   Plan:  Heparin 3000 unit IV bolus Heparin infusion at 750 units/hr Check heparin level in 8 hrs (due to renal dysfunction) and daily.  Misty Stanley Stillinger 03/15/2013,5:13 PM

## 2013-03-15 NOTE — ED Notes (Signed)
Pt to ED with c/o uncontrolled a-fib.  Daughter st's heart rate has been very high.  Pt st's this am she had pain in her left arm and throat pt took NTG with relief.  Pt denies any chest pain at this time.  Color slightly pale, skin warm and dry.

## 2013-03-15 NOTE — H&P (Signed)
History and Physical   Patient ID: [name] MRN: [id number], DOB/AGE: [birth year]   Admit date: 03/15/2013 Date of Consult: 03/15/2013   Primary Physician: [name], MD Primary Cardiologist: Judie Petit. Excell Seltzer, MD  HPI: [name] is a 78 y.o. female with a complex PMHx including CAD s/p DES-OM 05/2012, CKD stage IV-V, PAF, intermittent LBBB, chronic combined CHF who presents to [facility] ED today with palpitations and throat pain.   She awoke this AM with the "jitters" and associated throat and left arm pain similar to her prior angina. She denies chest discomfort. She waited for a period of time, and took one SL NTG without relief. A second NTG relieved the discomfort. The discomfort recurred, she called her daughter and presented to the ED. Denies SOB, syncope, presyncope, PND, LEE or orthopnea,   In the ED, EKG revealed atrial fibrillation with RVR, rate 108, LBBB, LAD. POC trop-I mildly evated. BUN 29/Cr 2.10 (baseline 2.1-2.5). CBC unremarkable. CXR without acute abnormalities.   Problem List: Past Medical History  Diagnosis Date  . Lower extremity edema     bilateral  . CKD (chronic kidney disease), stage III     a. baseline creat 1.6->2  . Labile hypertension   . Anxiety   . Failure to thrive in childhood   . History of shingles July 2004    on the left side of her thorax and postherpetic neuralgia  . History of psoriasis   . History of recurrent UTIs   . Major depressive disorder, single episode, severe 2003/04/16    "when my husband died"  . Valvular heart disease     a. 07/2012 Echo in setting of afib: EF 35%, Mild AI/MR, Mod Dil LA, Mod TR,  . CAD (coronary artery disease)     a. NSTEMI 05/2012 s/p DES to OM 06/07/12 (residual severe LCx subbranch dz for med rx unless refractory angina recurs)  . LBBB (left bundle branch block)     intermittent  . Chronic combined systolic and diastolic CHF (congestive heart failure)     a. 05/2012 Echo: EF 60-65%, Gr 1  DD, mild AI/MR, Mod TR;  b. 07/2012 Echo in setting of afib: EF 35%, Mild AI/MR, Mod Dil LA, Mod TR, PASP .  . Atrial fibrillation     a. Dx 07/2012 -> Amio started.  No coumadin 2/2 ongoing DAPT;  b. 08/15/2012 Amio d/c 2/2 nausea - maintaining sinus rhythm.    Past Surgical History  Procedure Laterality Date  . Salpingoophorectomy      "cyst removed"  . Appendectomy    . Breast cyst excision    . Abdominal hysterectomy    . Cataract extraction, bilateral  ~ 15-Apr-2009  . Cardiac surgery       Allergies:  Allergies  Allergen Reactions  . Metoprolol Swelling    Takes bisoprolol without issue  . Norvasc (Amlodipine Besylate) Swelling    Takes nisoldipine without issue  . Sulfonamide Derivatives Hives, Itching and Swelling  . Amiodarone Nausea And Vomiting  . Statins     Myalgias - "I won't take them"    Home Medications: Prior to Admission medications   Medication Sig Start Date End Date Taking? Authorizing Provider  aspirin EC 81 MG tablet Take 1 tablet (81 mg total) by mouth daily. 06/08/12  Yes Dayna N Dunn, PA-C  b complex vitamins tablet Take 1 tablet by mouth daily.     Yes Historical Provider, MD  bisoprolol (ZEBETA) 5 MG tablet Take 5  mg by mouth daily. 11/13/12  Yes Tonny Bollman, MD  Cholecalciferol (VITAMIN D-3) 5000 UNITS TABS Take 1 tablet by mouth daily.   Yes Historical Provider, MD  clopidogrel (PLAVIX) 75 MG tablet Take 75 mg by mouth daily.   Yes Historical Provider, MD  felodipine (PLENDIL) 5 MG 24 hr tablet Take 5 mg by mouth daily. 01/23/13  Yes [name], MD  fish oil-omega-3 fatty acids 1000 MG capsule Take 1 g by mouth 2 (two) times daily.    Yes Historical Provider, MD  isosorbide mononitrate (IMDUR) 30 MG 24 hr tablet Take 30 mg by mouth daily.   Yes Historical Provider, MD  Boris Lown Oil 300 MG CAPS Take 1 capsule by mouth daily.    Yes Historical Provider, MD  L-Lysine 500 MG CAPS Take 1 capsule by mouth 2 (two) times daily.   Yes Historical  Provider, MD  loratadine (CLARITIN) 10 MG tablet Take 10 mg by mouth daily.   Yes Historical Provider, MD  nitroGLYCERIN (NITROSTAT) 0.4 MG SL tablet Place 1 tablet (0.4 mg total) under the tongue every 5 (five) minutes x 3 doses as needed for chest pain. 06/08/12 06/08/13 Yes Laurann Montana, PA-C    Inpatient Medications:     (Not in a hospital admission)  Family History  Problem Relation Age of Onset  . Diabetes    . Cancer    . Lung cancer       History   Social History  . Marital Status: Widowed    Spouse Name: N/A    Number of Children: N/A  . Years of Education: N/A   Occupational History  . Not on file.   Social History Main Topics  . Smoking status: Former Smoker -- 1.50 packs/day for 30 years    Types: Cigarettes    Quit date: 09/01/1979  . Smokeless tobacco: Never Used  . Alcohol Use: Yes     Comment: 06/06/12 "mixed drink once or twice a year.to celebrate"  . Drug Use: No  . Sexually Active: No   Other Topics Concern  . Not on file   Social History Narrative   Lives in [place].     Review of Systems: General: seasonal allergies,  negative for chills, fever, night sweats or weight changes.  Cardiovascular: positive for palpitations, throat pain, negative for chest pain, dyspnea on exertion, edema, orthopnea, palpitations, paroxysmal nocturnal dyspnea or shortness of breath Dermatological: negative for rash Respiratory: negative for cough or wheezing Urologic: positive for recurrent UTIs, negative for hematuria Abdominal: negative for nausea, vomiting, diarrhea, bright red blood per rectum, melena, or hematemesis Neurologic: negative for visual changes, syncope, or dizziness All other systems reviewed and are otherwise negative except as noted above.  Physical Exam: Blood pressure 124/65, pulse 66, temperature 97.6 F (36.4 C), temperature source Oral, resp. rate 16, height 5' 1.02" (1.55 m), weight 61.2 kg (134 lb 14.7 oz), SpO2 96.00%.    General:  Well developed, well nourished, in no acute distress. Head: Normocephalic, atraumatic, sclera non-icteric, no xanthomas, nares are without discharge.  Neck: Negative for carotid bruits. JVD not elevated. Lungs: Clear bilaterally to auscultation without wheezes, rales, or rhonchi. Breathing is unlabored. Heart: Irregularly irregular, distant heart sounds, with S1 S2. No murmurs, rubs, or gallops appreciated. Abdomen: Soft, non-tender, non-distended with normoactive bowel sounds. No hepatomegaly. No rebound/guarding. No obvious abdominal masses. Msk:  Strength and tone appears normal for age. Extremities: No clubbing, cyanosis or edema.  Distal pedal pulses are 2+ and equal  bilaterally. Neuro: Alert and oriented X 3. Moves all extremities spontaneously. Psych:   Responds to questions appropriately with a normal affect.  Labs: Recent Labs     03/15/13  1542  03/15/13  1614  WBC  8.3   --   HGB  12.6  12.9  HCT  35.6*  38.0  MCV  85.2   --   PLT  288   --    Recent Labs Lab 03/15/13 1614  NA 140  K 3.5  CL 104  BUN 29*  CREATININE 2.10*  GLUCOSE 111*   Radiology/Studies: Dg Chest Port 1 View  03/15/2013  *RADIOLOGY REPORT*  Clinical Data: 79 year old female with chest and left arm pain. History of atrial fibrillation.  PORTABLE CHEST - 1 VIEW  Comparison: 08/17/2012 and earlier.  Findings: Portable semi upright AP view 1558 hours.  Stable large lung volumes. Stable cardiomegaly and mediastinal contours. Interval improved retrocardiac ventilation.  Apical scarring greater on the left, appears stable.  No pneumothorax or pulmonary edema.  No areas of worsening ventilation.  IMPRESSION: No acute cardiopulmonary abnormality.   Original Report Authenticated By: Erskine Speed, M.D.     EKG: atrial fibrillation with RVR, 108 bpm, LAD, LBBB  ASSESSMENT AND PLAN:   79 y.o. female with a complex PMHx including CAD s/p DES-OM 05/2012, CKD stage IV-V, PAF, intermittent LBBB, chronic combined  CHF who presents to [facility] ED today with palpitations and throat pain.   1. Troponemia 2. CAD s/p PCI 05/2012 3. PAF 4. Chronic combined CHF, EF 55-60% on echo 03/12/13 5. CKD, stage IV-V 6. Intermittent LBBB  See below for comprehensive plan.  Signed, R. Hurman Horn, PA-C 03/15/2013, 5:32 PM   Attending Note:   The patient was seen and examined.  Agree with assessment and plan as noted above.  Changes made to the above note as needed.  Pt presents with recurrent atrial fib with RVR associated with  throat pain and arm pain - similar to her previous episodes of angina.  The discomfort eased with 2 SL NTG but the "jitters" remained.  She came to the ED and was found to have recurrent AFib and a mildly elevated POC Troponin I level.  She is feeling better   Exam is as above and is unremarkable except for atrial fib  A/P  1. CAD  She is s/p stenting in July 2013 ( [facility]) and optimally would be on Plavix for another 3 months.  She has stage IV CKD and does not want to have a cath unless it is absolutely necessary.  Possible etiologies for her CP and mild Troponin elevation include a plaque repture, demand ischemia due to rapid atrial fib.   We will cycle cardiac enzymes.  If her enzymes are negative or barely positive, I think we should continue with aggressive medical therapy.  If the enzymes are markedly abnormal then we may have to consider a repeat cath.  We will need to consider the fact that her CKD may be associated with mildly elevated Troponin levels due to reduced clearance.    Continue heparin for now ( overnight)   2. Atrial fib:  She has developed rapid atrial fib.  She has a hx of PAF but has not been on anticoagulation because she has needed ASA and Plavix.  We will continue heparin.  In his last note, Dr. Excell Seltzer noted that he may consider stopping the plavix slightly early if she has significant atrial  fib and needed to  start coumadin.  3. Combined systolic and diastolic  CHF.  Her EF has improved on bisooprolol.  She is not a candidate for ARD or ACE .    4. CKD:  She does not want to go on dialysis .  ( she did not refuse to go on dialysis however)   We will need to be very conservative with our approach to her CAD.   Vesta Mixer, Montez Hageman., MD, [facility] 03/15/2013, 5:57 PM

## 2013-03-15 NOTE — ED Notes (Signed)
Cardiology at bedside.

## 2013-03-15 NOTE — ED Provider Notes (Signed)
History     CSN: [id number]  Arrival date & time 03/15/13  1503   First MD Initiated Contact with Patient 03/15/13 1522      Chief Complaint  Patient presents with  . Atrial Fibrillation    (Consider location/radiation/quality/duration/timing/severity/associated sxs/prior treatment) HPI Comments: Her old female with a history of myocardial infarction, atrial fibrillation which resolved after initial treatment last year presents with a complaint of palpitations. She states that after waking this morning she had acute onset of throat pain, left arm pain and palpitations. The throat pain and arm pain resolved with nitroglycerin sublingual, she took one baby aspirin but has suffered with palpitations and chest discomfort since it started. Nothing seems to make this better or worse, not associated with nausea vomiting fevers chills cough or shortness of breath and is not associated with swelling of the legs. The symptoms are moderate to severe, she has not had any syncope with this. She reports having a stent in her heart in the past  Cardiologist, Dr. Excell Seltzer  Patient is a 79 y.o. female presenting with atrial fibrillation. The history is provided by the patient, a relative and medical records.  Atrial Fibrillation    Past Medical History  Diagnosis Date  . Lower extremity edema     bilateral  . CKD (chronic kidney disease), stage III     a. baseline creat 1.6->2  . Labile hypertension   . Anxiety   . Failure to thrive in childhood   . History of shingles July 2004    on the left side of her thorax and postherpetic neuralgia  . History of psoriasis   . History of recurrent UTIs   . Major depressive disorder, single episode, severe 04/16/2003    "when my husband died"  . Valvular heart disease     a. 07/2012 Echo in setting of afib: EF 35%, Mild AI/MR, Mod Dil LA, Mod TR,  . CAD (coronary artery disease)     a. NSTEMI 05/2012 s/p DES to OM 06/07/12 (residual severe LCx subbranch dz for med  rx unless refractory angina recurs)  . LBBB (left bundle branch block)     intermittent  . Chronic combined systolic and diastolic CHF (congestive heart failure)     a. 05/2012 Echo: EF 60-65%, Gr 1 DD, mild AI/MR, Mod TR;  b. 07/2012 Echo in setting of afib: EF 35%, Mild AI/MR, Mod Dil LA, Mod TR, PASP .  . Atrial fibrillation     a. Dx 07/2012 -> Amio started.  No coumadin 2/2 ongoing DAPT;  b. 08/15/2012 Amio d/c 2/2 nausea - maintaining sinus rhythm.    Past Surgical History  Procedure Laterality Date  . Salpingoophorectomy      "cyst removed"  . Appendectomy    . Breast cyst excision    . Abdominal hysterectomy    . Cataract extraction, bilateral  ~ 04-15-09  . Cardiac surgery      Family History  Problem Relation Age of Onset  . Diabetes    . Cancer    . Lung cancer      History  Substance Use Topics  . Smoking status: Former Smoker -- 1.50 packs/day for 30 years    Types: Cigarettes    Quit date: 09/01/1979  . Smokeless tobacco: Never Used  . Alcohol Use: Yes     Comment: 06/06/12 "mixed drink once or twice a year.to celebrate"    OB History   Grav Para Term Preterm Abortions TAB SAB Ect Mult Living  Review of Systems  All other systems reviewed and are negative.    Allergies  Metoprolol; Norvasc; Sulfonamide derivatives; Amiodarone; and Statins  Home Medications   Current Outpatient Rx  Name  Route  Sig  Dispense  Refill  . aspirin EC 81 MG tablet   Oral   Take 1 tablet (81 mg total) by mouth daily.         Marland Kitchen b complex vitamins tablet   Oral   Take 1 tablet by mouth daily.           . bisoprolol (ZEBETA) 5 MG tablet   Oral   Take 5 mg by mouth daily.         . Cholecalciferol (VITAMIN D-3) 5000 UNITS TABS   Oral   Take 1 tablet by mouth daily.         . clopidogrel (PLAVIX) 75 MG tablet   Oral   Take 75 mg by mouth daily.         . felodipine (PLENDIL) 5 MG 24 hr tablet   Oral   Take 5 mg by mouth daily.          . fish oil-omega-3 fatty acids 1000 MG capsule   Oral   Take 1 g by mouth 2 (two) times daily.          . isosorbide mononitrate (IMDUR) 30 MG 24 hr tablet   Oral   Take 30 mg by mouth daily.         Boris Lown Oil 300 MG CAPS   Oral   Take 1 capsule by mouth daily.          Marland Kitchen L-Lysine 500 MG CAPS   Oral   Take 1 capsule by mouth 2 (two) times daily.         Marland Kitchen loratadine (CLARITIN) 10 MG tablet   Oral   Take 10 mg by mouth daily.         . nitroGLYCERIN (NITROSTAT) 0.4 MG SL tablet   Sublingual   Place 1 tablet (0.4 mg total) under the tongue every 5 (five) minutes x 3 doses as needed for chest pain.   25 tablet   4     BP 99/59  Pulse 66  Temp(Src) 97.6 F (36.4 C) (Oral)  Resp 16  SpO2 96%  Physical Exam  Nursing note and vitals reviewed. Constitutional: She appears well-developed and well-nourished.  Uncomfortable appearing  HENT:  Head: Normocephalic and atraumatic.  Mouth/Throat: Oropharynx is clear and moist. No oropharyngeal exudate.  Eyes: Conjunctivae and EOM are normal. Pupils are equal, round, and reactive to light. Right eye exhibits no discharge. Left eye exhibits no discharge. No scleral icterus.  Neck: Normal range of motion. Neck supple. No JVD present. No thyromegaly present.  Cardiovascular: Normal heart sounds and intact distal pulses.  Exam reveals no gallop and no friction rub.   No murmur heard. Tachycardia, irregularly irregular rhythm, no JVD  Pulmonary/Chest: Effort normal and breath sounds normal. No respiratory distress. She has no wheezes. She has no rales.  Abdominal: Soft. Bowel sounds are normal. She exhibits no distension and no mass. There is no tenderness.  Musculoskeletal: Normal range of motion. She exhibits no edema and no tenderness.  Lymphadenopathy:    She has no cervical adenopathy.  Neurological: She is alert. Coordination normal.  Skin: Skin is warm and dry. No rash noted. No erythema.  Psychiatric: She has  a normal mood and affect. Her behavior is normal.  ED Course  Procedures (including critical care time)  Labs Reviewed  CBC WITH DIFFERENTIAL - Abnormal; Notable for the following:    HCT 35.6 (*)    All other components within normal limits  POCT I-STAT, CHEM 8 - Abnormal; Notable for the following:    BUN 29 (*)    Creatinine, Ser 2.10 (*)    Glucose, Bld 111 (*)    All other components within normal limits  POCT I-STAT TROPONIN I - Abnormal; Notable for the following:    Troponin i, poc 0.12 (*)    All other components within normal limits  APTT  PROTIME-INR   Dg Chest Port 1 View  03/15/2013  *RADIOLOGY REPORT*  Clinical Data: 79 year old female with chest and left arm pain. History of atrial fibrillation.  PORTABLE CHEST - 1 VIEW  Comparison: 08/17/2012 and earlier.  Findings: Portable semi upright AP view 1558 hours.  Stable large lung volumes. Stable cardiomegaly and mediastinal contours. Interval improved retrocardiac ventilation.  Apical scarring greater on the left, appears stable.  No pneumothorax or pulmonary edema.  No areas of worsening ventilation.  IMPRESSION: No acute cardiopulmonary abnormality.   Original Report Authenticated By: Erskine Speed, M.D.      1. Atrial fibrillation with RVR   2. Chest pain   3. Renal insufficiency       MDM  The patient has atrial fibrillation with rapid ventricular response, pulses between 120 130, EKG shows a left bundle branch block which according to old EKG is unchanged. She is however in atrial fibrillation again been normal he is not, she does take a beta blocker as well as Plavix daily.  I am concerned given the patient's throat discomfort and shoulder pain that she may have had a coronary event or acute coronary syndrome though there is no evidence on EKG of acute ischemia such as ST elevation or depression. She will report further evaluation, rate control, aspirin 325 mg, chest x-ray and likely admission the hospital.  ED  ECG REPORT  I personally interpreted this EKG   Date: 03/15/2013   Rate: 108  Rhythm: atrial fibrillation  QRS Axis: left  Intervals: Fibrillation, prolonged QRS  ST/T Wave abnormalities: nonspecific ST/T changes  Conduction Disutrbances:left bundle branch block  Narrative Interpretation:   Old EKG Reviewed: unchanged compared with 08/12/2029   325 mg of aspirin given, Cardizem bolus with Cardizem drip also started with good control of the patient's rate however blood pressure remains at 95 systolic. She is mentating appropriately, has good pulses at the radial arteries on my exam, heparin ordered as a drip, cardiology consulted, physician assistant has returned call and will come see the patient for admission. The troponin was borderline positive at 0.12 suggestive of either demand ischemia or non-ST elevation MI.  Renal insufficiency is at baseline.  CRITICAL CARE Performed by: Vida Roller   Total critical care time: 35  Critical care time was exclusive of separately billable procedures and treating other patients.  Critical care was necessary to treat or prevent imminent or life-threatening deterioration.  Critical care was time spent personally by me on the following activities: development of treatment plan with patient and/or surrogate as well as nursing, discussions with consultants, evaluation of patient's response to treatment, examination of patient, obtaining history from patient or surrogate, ordering and performing treatments and interventions, ordering and review of laboratory studies, ordering and review of radiographic studies, pulse oximetry and re-evaluation of patient's condition.       Vida Roller,  MD 03/15/13 1650

## 2013-03-16 DIAGNOSIS — I200 Unstable angina: Secondary | ICD-10-CM

## 2013-03-16 DIAGNOSIS — N184 Chronic kidney disease, stage 4 (severe): Secondary | ICD-10-CM

## 2013-03-16 DIAGNOSIS — I509 Heart failure, unspecified: Secondary | ICD-10-CM

## 2013-03-16 DIAGNOSIS — M7989 Other specified soft tissue disorders: Secondary | ICD-10-CM

## 2013-03-16 DIAGNOSIS — I5043 Acute on chronic combined systolic (congestive) and diastolic (congestive) heart failure: Secondary | ICD-10-CM

## 2013-03-16 LAB — CBC
HCT: 31.6 % — ABNORMAL LOW (ref 36.0–46.0)
Hemoglobin: 11.2 g/dL — ABNORMAL LOW (ref 12.0–15.0)
MCH: 30.5 pg (ref 26.0–34.0)
MCHC: 35.4 g/dL (ref 30.0–36.0)
MCV: 86.1 fL (ref 78.0–100.0)
Platelets: 249 10*3/uL (ref 150–400)
RBC: 3.67 MIL/uL — ABNORMAL LOW (ref 3.87–5.11)
RDW: 13 % (ref 11.5–15.5)
WBC: 6.5 10*3/uL (ref 4.0–10.5)

## 2013-03-16 LAB — TROPONIN I: Troponin I: 0.7 ng/mL (ref <0.30)

## 2013-03-16 LAB — HEPARIN LEVEL (UNFRACTIONATED): Heparin Unfractionated: 0.42 IU/mL (ref 0.30–0.70)

## 2013-03-16 LAB — BASIC METABOLIC PANEL
CO2: 24 mEq/L (ref 19–32)
Calcium: 8.9 mg/dL (ref 8.4–10.5)
Chloride: 107 mEq/L (ref 96–112)
GFR calc Af Amer: 23 mL/min — ABNORMAL LOW (ref >90)
Sodium: 141 mEq/L (ref 135–145)

## 2013-03-16 LAB — URINALYSIS, ROUTINE W REFLEX MICROSCOPIC
Glucose, UA: NEGATIVE mg/dL
Leukocytes, UA: NEGATIVE
Nitrite: NEGATIVE
Protein, ur: NEGATIVE mg/dL

## 2013-03-16 LAB — LIPID PANEL
Cholesterol: 223 mg/dL — ABNORMAL HIGH (ref 0–200)
Total CHOL/HDL Ratio: 3.8 RATIO

## 2013-03-16 MED ORDER — OFF THE BEAT BOOK
Freq: Once | Status: AC
  Administered 2013-03-16: 07:00:00
  Filled 2013-03-16: qty 1

## 2013-03-16 MED ORDER — DILTIAZEM HCL 30 MG PO TABS
30.0000 mg | ORAL_TABLET | Freq: Four times a day (QID) | ORAL | Status: DC
  Administered 2013-03-16 – 2013-03-20 (×5): 30 mg via ORAL
  Filled 2013-03-16 (×19): qty 1

## 2013-03-16 MED ORDER — BISOPROLOL FUMARATE 10 MG PO TABS
10.0000 mg | ORAL_TABLET | Freq: Every day | ORAL | Status: DC
  Administered 2013-03-16 – 2013-03-22 (×7): 10 mg via ORAL
  Filled 2013-03-16 (×7): qty 1

## 2013-03-16 NOTE — Progress Notes (Signed)
Pt converted to NSR around 1pm today.  Ward Givens, NP notified.  Cardizem gtt d/c and po Cardizem was ordered.  EKG done.  Pt notified about rhythm change.  She states she is feeling better.  Educational handout given on Cardizem med.

## 2013-03-16 NOTE — Progress Notes (Signed)
History and Physical   Patient ID: [name] MRN: [id number], DOB/AGE: May 11, 1930   Admit date: 03/15/2013 Date of Consult: 03/16/2013   Primary Physician: [name], MD Primary Cardiologist: Judie Petit. Excell Seltzer, MD  HPI: [name] is a 79 y.o. female with a complex PMHx including CAD s/p DES-OM 05/2012, CKD stage IV-V, PAF, intermittent LBBB, chronic combined CHF who presents to [facility] ED today with palpitations and throat pain.   She awoke this AM with the "jitters" and associated throat and left arm pain similar to her prior angina. She denies chest discomfort. She waited for a period of time, and took one SL NTG without relief. A second NTG relieved the discomfort. The discomfort recurred, she called her daughter and presented to the ED. Denies SOB, syncope, presyncope, PND, LEE or orthopnea,   In the ED, EKG revealed atrial fibrillation with RVR, rate 108, LBBB, LAD. POC trop-I mildly evated. BUN 29/Cr 2.10 (baseline 2.1-2.5). CBC unremarkable. CXR without acute abnormalities.   Her troponin levels have trended up slightly.   Problem List: Past Medical History  Diagnosis Date  . Lower extremity edema     bilateral  . CKD (chronic kidney disease), stage III     a. baseline creat 1.6->2  . Labile hypertension   . Anxiety   . Failure to thrive in childhood   . History of shingles July 2004    on the left side of her thorax and postherpetic neuralgia  . History of psoriasis   . History of recurrent UTIs   . Major depressive disorder, single episode, severe Apr 11, 2003    "when my husband died"  . Valvular heart disease     a. 07/2012 Echo in setting of afib: EF 35%, Mild AI/MR, Mod Dil LA, Mod TR,  . CAD (coronary artery disease)     a. NSTEMI 05/2012 s/p DES to OM 06/07/12 (residual severe LCx subbranch dz for med rx unless refractory angina recurs)  . LBBB (left bundle branch block)     intermittent  . Chronic combined systolic and diastolic CHF (congestive  heart failure)     a. 05/2012 Echo: EF 60-65%, Gr 1 DD, mild AI/MR, Mod TR;  b. 07/2012 Echo in setting of afib: EF 35%, Mild AI/MR, Mod Dil LA, Mod TR, PASP .  . Atrial fibrillation     a. Dx 07/2012 -> Amio started.  No coumadin 2/2 ongoing DAPT;  b. 08/15/2012 Amio d/c 2/2 nausea - maintaining sinus rhythm.    Past Surgical History  Procedure Laterality Date  . Salpingoophorectomy      "cyst removed"  . Appendectomy    . Breast cyst excision    . Abdominal hysterectomy    . Cataract extraction, bilateral  ~ 2009/04/10  . Cardiac surgery       Allergies:  Allergies  Allergen Reactions  . Metoprolol Swelling    Takes bisoprolol without issue  . Norvasc (Amlodipine Besylate) Swelling    Takes nisoldipine without issue  . Sulfonamide Derivatives Hives, Itching and Swelling  . Amiodarone Nausea And Vomiting  . Statins     Myalgias - "I won't take them"    Home Medications: Prior to Admission medications   Medication Sig Start Date End Date Taking? Authorizing Provider  aspirin EC 81 MG tablet Take 1 tablet (81 mg total) by mouth daily. 06/08/12  Yes Dayna N Dunn, PA-C  b complex vitamins tablet Take 1 tablet by mouth daily.     Yes Historical Provider,  MD  bisoprolol (ZEBETA) 5 MG tablet Take 5 mg by mouth daily. 11/13/12  Yes Tonny Bollman, MD  Cholecalciferol (VITAMIN D-3) 5000 UNITS TABS Take 1 tablet by mouth daily.   Yes Historical Provider, MD  clopidogrel (PLAVIX) 75 MG tablet Take 75 mg by mouth daily.   Yes Historical Provider, MD  felodipine (PLENDIL) 5 MG 24 hr tablet Take 5 mg by mouth daily. 01/23/13  Yes [name], MD  fish oil-omega-3 fatty acids 1000 MG capsule Take 1 g by mouth 2 (two) times daily.    Yes Historical Provider, MD  isosorbide mononitrate (IMDUR) 30 MG 24 hr tablet Take 30 mg by mouth daily.   Yes Historical Provider, MD  Boris Lown Oil 300 MG CAPS Take 1 capsule by mouth daily.    Yes Historical Provider, MD  L-Lysine 500 MG CAPS Take 1 capsule by  mouth 2 (two) times daily.   Yes Historical Provider, MD  loratadine (CLARITIN) 10 MG tablet Take 10 mg by mouth daily.   Yes Historical Provider, MD  nitroGLYCERIN (NITROSTAT) 0.4 MG SL tablet Place 1 tablet (0.4 mg total) under the tongue every 5 (five) minutes x 3 doses as needed for chest pain. 06/08/12 06/08/13 Yes Dayna N Dunn, PA-C    Inpatient Medications:  . aspirin EC  81 mg Oral Daily  . bisoprolol  5 mg Oral Daily  . clopidogrel  75 mg Oral Daily  . isosorbide mononitrate  30 mg Oral Daily  . loratadine  10 mg Oral Daily  . off the beat book   Does not apply Once  . sodium chloride  3 mL Intravenous Q12H   Prescriptions prior to admission  Medication Sig Dispense Refill  . aspirin EC 81 MG tablet Take 1 tablet (81 mg total) by mouth daily.      Marland Kitchen b complex vitamins tablet Take 1 tablet by mouth daily.        . bisoprolol (ZEBETA) 5 MG tablet Take 5 mg by mouth daily.      . Cholecalciferol (VITAMIN D-3) 5000 UNITS TABS Take 1 tablet by mouth daily.      . clopidogrel (PLAVIX) 75 MG tablet Take 75 mg by mouth daily.      . felodipine (PLENDIL) 5 MG 24 hr tablet Take 5 mg by mouth daily.      . fish oil-omega-3 fatty acids 1000 MG capsule Take 1 g by mouth 2 (two) times daily.       . isosorbide mononitrate (IMDUR) 30 MG 24 hr tablet Take 30 mg by mouth daily.      Boris Lown Oil 300 MG CAPS Take 1 capsule by mouth daily.       Marland Kitchen L-Lysine 500 MG CAPS Take 1 capsule by mouth 2 (two) times daily.      Marland Kitchen loratadine (CLARITIN) 10 MG tablet Take 10 mg by mouth daily.      . nitroGLYCERIN (NITROSTAT) 0.4 MG SL tablet Place 1 tablet (0.4 mg total) under the tongue every 5 (five) minutes x 3 doses as needed for chest pain.  25 tablet  4    Family History  Problem Relation Age of Onset  . Diabetes    . Cancer    . Lung cancer       History   Social History  . Marital Status: Widowed    Spouse Name: N/A    Number of Children: N/A  . Years of Education: N/A   Occupational  History  .  Not on file.   Social History Main Topics  . Smoking status: Former Smoker -- 1.50 packs/day for 30 years    Types: Cigarettes    Quit date: 09/01/1979  . Smokeless tobacco: Never Used  . Alcohol Use: Yes     Comment: 06/06/12 "mixed drink once or twice a year.to celebrate"  . Drug Use: No  . Sexually Active: No   Other Topics Concern  . Not on file   Social History Narrative   Lives in [place].     Review of Systems: General: seasonal allergies,  negative for chills, fever, night sweats or weight changes.  Cardiovascular: positive for palpitations, throat pain, negative for chest pain, dyspnea on exertion, edema, orthopnea, palpitations, paroxysmal nocturnal dyspnea or shortness of breath Dermatological: negative for rash Respiratory: negative for cough or wheezing Urologic: positive for recurrent UTIs, negative for hematuria Abdominal: negative for nausea, vomiting, diarrhea, bright red blood per rectum, melena, or hematemesis Neurologic: negative for visual changes, syncope, or dizziness All other systems reviewed and are otherwise negative except as noted above.  Physical Exam: Blood pressure 143/79, pulse 97, temperature 98.1 F (36.7 C), temperature source Oral, resp. rate 18, height 5\' 1"  (1.549 m), weight 132 lb 12.8 oz (60.238 kg), SpO2 94.00%.    General: Well developed, well nourished, in no acute distress. Head: Normocephalic, atraumatic, sclera non-icteric, no xanthomas, nares are without discharge.  Neck: Negative for carotid bruits. JVD not elevated. Lungs: Clear bilaterally to auscultation without wheezes, rales, or rhonchi. Breathing is unlabored. Heart: Irregularly irregular, distant heart sounds, with S1 S2. No murmurs, rubs, or gallops appreciated. Abdomen: Soft, non-tender, non-distended with normoactive bowel sounds. No hepatomegaly. No rebound/guarding. No obvious abdominal masses. Msk:  Strength and tone appears normal for  age. Extremities: No clubbing, cyanosis or edema.  Distal pedal pulses are 2+ and equal bilaterally. Neuro: Alert and oriented X 3. Moves all extremities spontaneously. Psych:   Responds to questions appropriately with a normal affect.  Labs: Recent Labs     03/15/13  1542  03/15/13  1614  03/16/13  0640  WBC  8.3   --   6.5  HGB  12.6  12.9  11.2*  HCT  35.6*  38.0  31.6*  MCV  85.2   --   86.1  PLT  288   --   249    Recent Labs Lab 03/15/13 1614 03/16/13 0640  NA 140 141  K 3.5 4.1  CL 104 107  CO2  --  24  BUN 29* 30*  CREATININE 2.10* 2.23*  CALCIUM  --  8.9  GLUCOSE 111* 97   Radiology/Studies: Dg Chest Port 1 View  03/15/2013  *RADIOLOGY REPORT*  Clinical Data: 79 year old female with chest and left arm pain. History of atrial fibrillation.  PORTABLE CHEST - 1 VIEW  Comparison: 08/17/2012 and earlier.  Findings: Portable semi upright AP view 1558 hours.  Stable large lung volumes. Stable cardiomegaly and mediastinal contours. Interval improved retrocardiac ventilation.  Apical scarring greater on the left, appears stable.  No pneumothorax or pulmonary edema.  No areas of worsening ventilation.  IMPRESSION: No acute cardiopulmonary abnormality.   Original Report Authenticated By: Erskine Speed, M.D.     EKG: atrial fibrillation with RVR, 108 bpm, LAD, LBBB  ASSESSMENT AND PLAN:   79 y.o. female with a complex PMHx including CAD s/p DES-OM 05/2012, CKD stage IV-V, PAF, intermittent LBBB, chronic combined CHF who presents to [facility] ED today with palpitations and throat pain.  1.  Elevated troponin  2. CAD s/p PCI 05/2012 3. PAF 4. Chronic combined CHF, EF 55-60% on echo 03/12/13 5. CKD, stage IV-V 6. Intermittent LBBB    A/P  1. CAD  She is s/p stenting in July 2013 ( [facility]) and optimally would be on Plavix for another 3 months.  She has stage IV CKD and does not want to have a cath unless it is absolutely necessary.  Possible etiologies for her CP and mild  Troponin elevation include a plaque repture, demand ischemia due to rapid atrial fib.   We will cycle cardiac enzymes.   If the enzymes are markedly abnormal then we may have to consider a repeat cath.  We will need to consider the fact that her CKD may be associated with mildly elevated Troponin levels due to reduced clearance.   Her troponin levels have continued to increase slightly.  Will get 3 more to follow the trend.   I still think these are c/w demand ischemia due to rapid AF in the setting of CKD .  Continue heparin for now .  Will need to follow until her enzymes trend downward.   2. Atrial fib:  She has developed rapid atrial fib.  She has a hx of PAF but has not been on anticoagulation because she has needed ASA and Plavix.  We will continue heparin.  In his last note, Dr. Excell Seltzer noted that he may consider stopping the plavix slightly early if she has significant atrial  fib and needed to start coumadin.    Will increase bisoprolol to help with rate control.  I had intended on changing her to metoprolol but she apparently had leg swelling with metoprolol   3. Combined systolic and diastolic CHF.  Her EF has improved on bisooprolol.  She is not a candidate for ARD or ACE .    4. CKD:  She does not want to go on dialysis .  ( she did not refuse to go on dialysis however)   We will need to be very conservative with our approach to her CAD.   Vesta Mixer, Montez Hageman., MD, [facility] 03/16/2013, 8:29 AM

## 2013-03-16 NOTE — Progress Notes (Signed)
ANTICOAGULATION CONSULT NOTE  Pharmacy Consult for Heparin Indication: atrial fibrillation  Allergies  Allergen Reactions  . Metoprolol Swelling    Takes bisoprolol without issue  . Norvasc (Amlodipine Besylate) Swelling    Takes nisoldipine without issue  . Sulfonamide Derivatives Hives, Itching and Swelling  . Amiodarone Nausea And Vomiting  . Statins     Myalgias - "I won't take them"    Patient Measurements: Height: 5\' 1"  (154.9 cm) Weight: 132 lb 12.8 oz (60.238 kg) IBW/kg (Calculated) : 47.8 Heparin Dosing Weight: 60 kg  Vital Signs: Temp: 97.9 F (36.6 C) (04/19 0016) Temp src: Oral (04/18 1521) BP: 123/69 mmHg (04/19 0016) Pulse Rate: 63 (04/19 0016)  Labs:  Recent Labs  03/15/13 1542 03/15/13 1614 03/15/13 2036 03/16/13 0209  HGB 12.6 12.9  --   --   HCT 35.6* 38.0  --   --   PLT 288  --   --   --   APTT 29  --   --   --   LABPROT 13.2  --   --   --   INR 1.01  --   --   --   HEPARINUNFRC  --   --   --  0.33  CREATININE  --  2.10*  --   --   TROPONINI  --   --  1.06*  --     Estimated Creatinine Clearance: 17.5 ml/min (by C-G formula based on Cr of 2.1).  Assessment: 79 y/o Female with Afib/ACS for heparin   Goal of Therapy:  Heparin level 0.3-0.7 units/ml Monitor platelets by anticoagulation protocol: Yes   Plan:  Continue Heparin at current rate  Follow-up am labs.   Kanin Lia, Gary Fleet 03/16/2013,2:58 AM

## 2013-03-16 NOTE — Progress Notes (Signed)
MD notified of Troponin 1.34.  No new orders received.  Will continue to monitor. Alonza Bogus

## 2013-03-16 NOTE — Progress Notes (Signed)
At 2212 pt c/o SOB and feeling like something wasn't "right". BP 184/87 Hr 67 and SR , sats 96% on RA and temp 97.7. Pt appears anxious and is using accessory muscles in breathing. Lungs diminished at bases with some crackles. O2 applied at 2L [place] and 12 lead EKG done. Ward Givens, PA notified and he asked that on call MD come and see pt. Dr Allena Katz notified and up to see pt. Pt then c/o nausea and vomitted about 200cc of food and pill she had from earlier in the day. Tele called stating pts HR dropped to 40 with vomitting. Dr Allena Katz at bedside and aware. Pt given Zofran for vomitting and Dr Allena Katz asked to be notified if symptoms worsen. At 2300 pt feeling better and going to try and rest. Continues to have some mild SOB but sats 99% on 2L and she feels like its better. Cont to monitor.

## 2013-03-16 NOTE — Progress Notes (Signed)
ANTICOAGULATION CONSULT NOTE - Follow Up Consult  Pharmacy Consult for Heparin Indication: atrial fibrillation  Allergies  Allergen Reactions  . Metoprolol Swelling    Takes bisoprolol without issue  . Norvasc (Amlodipine Besylate) Swelling    Takes nisoldipine without issue  . Sulfonamide Derivatives Hives, Itching and Swelling  . Amiodarone Nausea And Vomiting  . Statins     Myalgias - "I won't take them"    Patient Measurements: Height: 5\' 1"  (154.9 cm) Weight: 132 lb 12.8 oz (60.238 kg) IBW/kg (Calculated) : 47.8 Heparin Dosing Weight: 60kg  Vital Signs: Temp: 98.1 F (36.7 C) (04/19 0500) BP: 143/79 mmHg (04/19 0500) Pulse Rate: 97 (04/19 0500)  Labs:  Recent Labs  03/15/13 1542 03/15/13 1614 03/15/13 2036 03/16/13 0209 03/16/13 0220 03/16/13 0640  HGB 12.6 12.9  --   --   --  11.2*  HCT 35.6* 38.0  --   --   --  31.6*  PLT 288  --   --   --   --  249  APTT 29  --   --   --   --   --   LABPROT 13.2  --   --   --   --   --   INR 1.01  --   --   --   --   --   HEPARINUNFRC  --   --   --  0.33  --  0.42  CREATININE  --  2.10*  --   --   --   --   TROPONINI  --   --  1.06*  --  1.34*  --     Estimated Creatinine Clearance: 17.5 ml/min (by C-G formula based on Cr of 2.1).   Medications:  Heparin @ 750 units/hr  Assessment: 83yof continues on heparin for afib, positive troponins x 2. Heparin level is therapeutic. CBC stable. No bleeding noted.  Goal of Therapy:  INR 2-3 Heparin level 0.3-0.7 units/ml Monitor platelets by anticoagulation protocol: Yes   Plan:  1) Continue heparin at 750 units/hr 2) Follow up heparin level, CBC in AM 3) Follow up plan for oral anticoagulation  Fredrik Rigger 03/16/2013,7:37 AM

## 2013-03-17 ENCOUNTER — Inpatient Hospital Stay (HOSPITAL_COMMUNITY): Payer: Medicare Other

## 2013-03-17 DIAGNOSIS — I5033 Acute on chronic diastolic (congestive) heart failure: Secondary | ICD-10-CM

## 2013-03-17 LAB — CBC
MCV: 87.2 fL (ref 78.0–100.0)
Platelets: 250 10*3/uL (ref 150–400)
RBC: 3.74 MIL/uL — ABNORMAL LOW (ref 3.87–5.11)
RDW: 12.8 % (ref 11.5–15.5)
WBC: 9.3 10*3/uL (ref 4.0–10.5)

## 2013-03-17 LAB — HEPARIN LEVEL (UNFRACTIONATED)
Heparin Unfractionated: 0.17 IU/mL — ABNORMAL LOW (ref 0.30–0.70)
Heparin Unfractionated: 0.64 IU/mL (ref 0.30–0.70)

## 2013-03-17 LAB — TROPONIN I: Troponin I: 0.55 ng/mL (ref <0.30)

## 2013-03-17 MED ORDER — LABETALOL HCL 5 MG/ML IV SOLN
20.0000 mg | Freq: Once | INTRAVENOUS | Status: AC
  Administered 2013-03-17: 20 mg via INTRAVENOUS

## 2013-03-17 MED ORDER — HEPARIN BOLUS VIA INFUSION
1000.0000 [IU] | Freq: Once | INTRAVENOUS | Status: AC
  Administered 2013-03-17: 1000 [IU] via INTRAVENOUS
  Filled 2013-03-17: qty 1000

## 2013-03-17 MED ORDER — PROMETHAZINE HCL 25 MG/ML IJ SOLN
25.0000 mg | INTRAMUSCULAR | Status: DC | PRN
  Administered 2013-03-17: 25 mg via INTRAVENOUS
  Administered 2013-03-18: 12.5 mg via INTRAVENOUS
  Filled 2013-03-17 (×3): qty 1

## 2013-03-17 MED ORDER — LEVALBUTEROL HCL 0.63 MG/3ML IN NEBU
0.6300 mg | INHALATION_SOLUTION | Freq: Four times a day (QID) | RESPIRATORY_TRACT | Status: DC
  Administered 2013-03-17 – 2013-03-21 (×13): 0.63 mg via RESPIRATORY_TRACT
  Filled 2013-03-17 (×21): qty 3

## 2013-03-17 MED ORDER — LABETALOL HCL 5 MG/ML IV SOLN
INTRAVENOUS | Status: AC
  Filled 2013-03-17: qty 4

## 2013-03-17 NOTE — Progress Notes (Signed)
History and Physical   Patient ID: [name] MRN: [id number], DOB/AGE: 04-23-1930   Admit date: 03/15/2013 Date of Consult: 03/17/2013   Primary Physician: [name], MD Primary Cardiologist: Judie Petit. Excell Seltzer, MD  HPI: [name] is a 79 y.o. female with a complex PMHx including CAD s/p DES-OM 05/2012, CKD stage IV-V, PAF, intermittent LBBB, chronic combined CHF who presents to [facility] ED today with palpitations and throat pain.   She awoke this AM with the "jitters" and associated throat and left arm pain similar to her prior angina. She denies chest discomfort. She waited for a period of time, and took one SL NTG without relief. A second NTG relieved the discomfort. The discomfort recurred, she called her daughter and presented to the ED. Denies SOB, syncope, presyncope, PND, LEE or orthopnea,   In the ED, EKG revealed atrial fibrillation with RVR, rate 108, LBBB, LAD. POC trop-I mildly evated. BUN 29/Cr 2.10 (baseline 2.1-2.5). CBC unremarkable. CXR without acute abnormalities.   Her troponin levels have trended up slightly but are now trending back down.  She converted to NSR yesterday.  She received PO Cardizem which caused nausea and vomiting.  She said that has occurred previously.    Problem List: Past Medical History  Diagnosis Date  . Lower extremity edema     bilateral  . CKD (chronic kidney disease), stage III     a. baseline creat 1.6->2  . Labile hypertension   . Anxiety   . Failure to thrive in childhood   . History of shingles July 2004    on the left side of her thorax and postherpetic neuralgia  . History of psoriasis   . History of recurrent UTIs   . Major depressive disorder, single episode, severe 04/23/2003    "when my husband died"  . Valvular heart disease     a. 07/2012 Echo in setting of afib: EF 35%, Mild AI/MR, Mod Dil LA, Mod TR,  . CAD (coronary artery disease)     a. NSTEMI 05/2012 s/p DES to OM 06/07/12 (residual severe LCx  subbranch dz for med rx unless refractory angina recurs)  . LBBB (left bundle branch block)     intermittent  . Chronic combined systolic and diastolic CHF (congestive heart failure)     a. 05/2012 Echo: EF 60-65%, Gr 1 DD, mild AI/MR, Mod TR;  b. 07/2012 Echo in setting of afib: EF 35%, Mild AI/MR, Mod Dil LA, Mod TR, PASP .  . Atrial fibrillation     a. Dx 07/2012 -> Amio started.  No coumadin 2/2 ongoing DAPT;  b. 08/15/2012 Amio d/c 2/2 nausea - maintaining sinus rhythm.    Past Surgical History  Procedure Laterality Date  . Salpingoophorectomy      "cyst removed"  . Appendectomy    . Breast cyst excision    . Abdominal hysterectomy    . Cataract extraction, bilateral  ~ 04-22-2009  . Cardiac surgery       Allergies:  Allergies  Allergen Reactions  . Metoprolol Swelling    Takes bisoprolol without issue  . Norvasc (Amlodipine Besylate) Swelling    Takes nisoldipine without issue  . Sulfonamide Derivatives Hives, Itching and Swelling  . Amiodarone Nausea And Vomiting  . Statins     Myalgias - "I won't take them"    Home Medications: Prior to Admission medications   Medication Sig Start Date End Date Taking? Authorizing Provider  aspirin EC 81 MG tablet Take 1 tablet (81  mg total) by mouth daily. 06/08/12  Yes Dayna N Dunn, PA-C  b complex vitamins tablet Take 1 tablet by mouth daily.     Yes Historical Provider, MD  bisoprolol (ZEBETA) 5 MG tablet Take 5 mg by mouth daily. 11/13/12  Yes Tonny Bollman, MD  Cholecalciferol (VITAMIN D-3) 5000 UNITS TABS Take 1 tablet by mouth daily.   Yes Historical Provider, MD  clopidogrel (PLAVIX) 75 MG tablet Take 75 mg by mouth daily.   Yes Historical Provider, MD  felodipine (PLENDIL) 5 MG 24 hr tablet Take 5 mg by mouth daily. 01/23/13  Yes [name], MD  fish oil-omega-3 fatty acids 1000 MG capsule Take 1 g by mouth 2 (two) times daily.    Yes Historical Provider, MD  isosorbide mononitrate (IMDUR) 30 MG 24 hr tablet Take 30 mg by  mouth daily.   Yes Historical Provider, MD  Boris Lown Oil 300 MG CAPS Take 1 capsule by mouth daily.    Yes Historical Provider, MD  L-Lysine 500 MG CAPS Take 1 capsule by mouth 2 (two) times daily.   Yes Historical Provider, MD  loratadine (CLARITIN) 10 MG tablet Take 10 mg by mouth daily.   Yes Historical Provider, MD  nitroGLYCERIN (NITROSTAT) 0.4 MG SL tablet Place 1 tablet (0.4 mg total) under the tongue every 5 (five) minutes x 3 doses as needed for chest pain. 06/08/12 06/08/13 Yes Dayna N Dunn, PA-C    Inpatient Medications:  . aspirin EC  81 mg Oral Daily  . bisoprolol  10 mg Oral Daily  . clopidogrel  75 mg Oral Daily  . diltiazem  30 mg Oral Q6H  . isosorbide mononitrate  30 mg Oral Daily  . loratadine  10 mg Oral Daily  . sodium chloride  3 mL Intravenous Q12H   Prescriptions prior to admission  Medication Sig Dispense Refill  . aspirin EC 81 MG tablet Take 1 tablet (81 mg total) by mouth daily.      Marland Kitchen b complex vitamins tablet Take 1 tablet by mouth daily.        . bisoprolol (ZEBETA) 5 MG tablet Take 5 mg by mouth daily.      . Cholecalciferol (VITAMIN D-3) 5000 UNITS TABS Take 1 tablet by mouth daily.      . clopidogrel (PLAVIX) 75 MG tablet Take 75 mg by mouth daily.      . felodipine (PLENDIL) 5 MG 24 hr tablet Take 5 mg by mouth daily.      . fish oil-omega-3 fatty acids 1000 MG capsule Take 1 g by mouth 2 (two) times daily.       . isosorbide mononitrate (IMDUR) 30 MG 24 hr tablet Take 30 mg by mouth daily.      Boris Lown Oil 300 MG CAPS Take 1 capsule by mouth daily.       Marland Kitchen L-Lysine 500 MG CAPS Take 1 capsule by mouth 2 (two) times daily.      Marland Kitchen loratadine (CLARITIN) 10 MG tablet Take 10 mg by mouth daily.      . nitroGLYCERIN (NITROSTAT) 0.4 MG SL tablet Place 1 tablet (0.4 mg total) under the tongue every 5 (five) minutes x 3 doses as needed for chest pain.  25 tablet  4    Family History  Problem Relation Age of Onset  . Diabetes    . Cancer    . Lung cancer         History   Social History  .  Marital Status: Widowed    Spouse Name: N/A    Number of Children: N/A  . Years of Education: N/A   Occupational History  . Not on file.   Social History Main Topics  . Smoking status: Former Smoker -- 1.50 packs/day for 30 years    Types: Cigarettes    Quit date: 09/01/1979  . Smokeless tobacco: Never Used  . Alcohol Use: Yes     Comment: 06/06/12 "mixed drink once or twice a year.to celebrate"  . Drug Use: No  . Sexually Active: No   Other Topics Concern  . Not on file   Social History Narrative   Lives in [place].      Physical Exam: Blood pressure 165/77, pulse 62, temperature 97.6 F (36.4 C), temperature source Oral, resp. rate 18, height 5\' 1"  (1.549 m), weight 132 lb 12.8 oz (60.238 kg), SpO2 99.00%.    General: Well developed, well nourished, in no acute distress. Head: Normocephalic, atraumatic, sclera non-icteric, no xanthomas, nares are without discharge.  Neck: Negative for carotid bruits. JVD not elevated. Lungs: Clear bilaterally to auscultation without wheezes, rales, or rhonchi. Breathing is unlabored. Heart: RR, normal S1, S2. Abdomen: Soft, non-tender, non-distended with normoactive bowel sounds. No hepatomegaly. No rebound/guarding. No obvious abdominal masses. Msk:  Strength and tone appears normal for age. Extremities: No clubbing, cyanosis or edema.  Distal pedal pulses are 2+ and equal bilaterally. Neuro: Alert and oriented X 3. Moves all extremities spontaneously. Psych:   Responds to questions appropriately with a normal affect.  Labs: Recent Labs     03/16/13  0640  03/17/13  0129  WBC  6.5  9.3  HGB  11.2*  11.2*  HCT  31.6*  32.6*  MCV  86.1  87.2  PLT  249  250    Recent Labs Lab 03/15/13 1614 03/16/13 0640  NA 140 141  K 3.5 4.1  CL 104 107  CO2  --  24  BUN 29* 30*  CREATININE 2.10* 2.23*  CALCIUM  --  8.9  GLUCOSE 111* 97   Radiology/Studies: Dg Chest Port 1 View  03/15/2013   *RADIOLOGY REPORT*  Clinical Data: 79 year old female with chest and left arm pain. History of atrial fibrillation.  PORTABLE CHEST - 1 VIEW  Comparison: 08/17/2012 and earlier.  Findings: Portable semi upright AP view 1558 hours.  Stable large lung volumes. Stable cardiomegaly and mediastinal contours. Interval improved retrocardiac ventilation.  Apical scarring greater on the left, appears stable.  No pneumothorax or pulmonary edema.  No areas of worsening ventilation.  IMPRESSION: No acute cardiopulmonary abnormality.   Original Report Authenticated By: Erskine Speed, M.D.     Tele:  NSR at 59  ASSESSMENT AND PLAN:   79 y.o. female with a complex PMHx including CAD s/p DES-OM 05/2012, CKD stage IV-V, PAF, intermittent LBBB, chronic combined CHF who presents to [facility] ED today with palpitations and throat pain.   1.  Elevated troponin  2. CAD s/p PCI 05/2012 3. PAF 4. Chronic combined CHF, EF 55-60% on echo 03/12/13 5. CKD, stage IV-V 6. Intermittent LBBB    A/P  1. CAD  She is s/p stenting in July 2013 ( [facility]) and optimally would be on Plavix for another 3 months.  She has stage IV CKD and does not want to have a cath unless it is absolutely necessary.  I suspect the mild troponin elevation was due to her rapid atrial fib.   She had nausea and vomiting last  night after receiving Diltiazem PO - this has happened during a previous hospitalization.  Her rate is well controlled currently.    2. Atrial fib:  She has developed rapid atrial fib.  She has a hx of PAF but has not been on anticoagulation because she has needed ASA and Plavix.  We will continue heparin.  In his last note, Dr. Excell Seltzer noted that he may consider stopping the plavix slightly early if she has significant atrial  fib and needed to start coumadin.  Will try PRN propranolol for episodes of atrial fib.  She seems to respond well to the Bisoprolol.  Will watch her today and DC her tomorrow if she stays stable.    3.  Combined systolic and diastolic CHF.  Her EF has improved on bisooprolol.  She is not a candidate for ARD or ACE .    4. CKD:  She does not want to go on dialysis .  ( she did not refuse to go on dialysis however)   We will need to be very conservative with our approach to her CAD.   [name], Montez Hageman., MD, [facility] 03/17/2013, 9:29 AM

## 2013-03-17 NOTE — Progress Notes (Signed)
ANTICOAGULATION CONSULT NOTE - Follow Up Consult  Pharmacy Consult for Heparin Indication: atrial fibrillation  Allergies  Allergen Reactions  . Metoprolol Swelling    Takes bisoprolol without issue  . Norvasc (Amlodipine Besylate) Swelling    Takes nisoldipine without issue  . Sulfonamide Derivatives Hives, Itching and Swelling  . Amiodarone Nausea And Vomiting  . Statins     Myalgias - "I won't take them"    Patient Measurements: Height: 5\' 1"  (154.9 cm) Weight: 132 lb 12.8 oz (60.238 kg) IBW/kg (Calculated) : 47.8 Heparin Dosing Weight: 60kg  Vital Signs: Temp: 97.6 F (36.4 C) (04/20 0500) BP: 168/68 mmHg (04/20 0942) Pulse Rate: 63 (04/20 0942)  Labs:  Recent Labs  03/15/13 1542 03/15/13 1614  03/16/13 0640 03/16/13 1115 03/16/13 1758 03/17/13 0129 03/17/13 1034  HGB 12.6 12.9  --  11.2*  --   --  11.2*  --   HCT 35.6* 38.0  --  31.6*  --   --  32.6*  --   PLT 288  --   --  249  --   --  250  --   APTT 29  --   --   --   --   --   --   --   LABPROT 13.2  --   --   --   --   --   --   --   INR 1.01  --   --   --   --   --   --   --   HEPARINUNFRC  --   --   < > 0.42  --   --  0.17* 0.64  CREATININE  --  2.10*  --  2.23*  --   --   --   --   TROPONINI  --   --   < >  --  0.64* 0.70* 0.55*  --   < > = values in this interval not displayed.  Estimated Creatinine Clearance: 16.5 ml/min (by C-G formula based on Cr of 2.23).   Medications:  Heparin @ 950 units/hr  Assessment: 83yof continues on heparin for afib. Troponins are positive but trending down. Heparin level is therapeutic after re-bolus and rate increase this morning. Level has increased significantly from this morning, but this could be due to a bolus effect so will leave at current rate. CBC stable. No bleeding reported.  Goal of Therapy:  INR 2-3 Heparin level 0.3-0.7 units/ml Monitor platelets by anticoagulation protocol: Yes   Plan:  1) Continue heparin at 950 units/hr 2) Follow up  heparin level, CBC in AM 3) Follow up plan for oral anticoagulation  Fredrik Rigger 03/17/2013,11:29 AM

## 2013-03-17 NOTE — Progress Notes (Signed)
ANTICOAGULATION CONSULT NOTE - Follow Up Consult  Pharmacy Consult for Heparin Indication: atrial fibrillation  Allergies  Allergen Reactions  . Metoprolol Swelling    Takes bisoprolol without issue  . Norvasc (Amlodipine Besylate) Swelling    Takes nisoldipine without issue  . Sulfonamide Derivatives Hives, Itching and Swelling  . Amiodarone Nausea And Vomiting  . Statins     Myalgias - "I won't take them"    Patient Measurements: Height: 5\' 1"  (154.9 cm) Weight: 132 lb 12.8 oz (60.238 kg) IBW/kg (Calculated) : 47.8 Heparin Dosing Weight: 60kg  Vital Signs: Temp: 97.7 F (36.5 C) (04/19 2100) BP: 176/69 mmHg (04/19 2100) Pulse Rate: 68 (04/19 2100)  Labs:  Recent Labs  03/15/13 1542 03/15/13 1614  03/16/13 0209 03/16/13 0220 03/16/13 0640 03/16/13 1115 03/16/13 1758 03/17/13 0129  HGB 12.6 12.9  --   --   --  11.2*  --   --  11.2*  HCT 35.6* 38.0  --   --   --  31.6*  --   --  32.6*  PLT 288  --   --   --   --  249  --   --  250  APTT 29  --   --   --   --   --   --   --   --   LABPROT 13.2  --   --   --   --   --   --   --   --   INR 1.01  --   --   --   --   --   --   --   --   HEPARINUNFRC  --   --   --  0.33  --  0.42  --   --  0.17*  CREATININE  --  2.10*  --   --   --  2.23*  --   --   --   TROPONINI  --   --   < >  --  1.34*  --  0.64* 0.70*  --   < > = values in this interval not displayed.  Estimated Creatinine Clearance: 16.5 ml/min (by C-G formula based on Cr of 2.23).   Medications:  Heparin @ 750 units/hr  Assessment: 83yof continues on heparin for afib, positive troponins x 2. Heparin level (0.17) is below-goal on 750 units/hr. No problem with line / infusion and no bleeding per RN.   Goal of Therapy:  INR 2-3 Heparin level 0.3-0.7 units/ml Monitor platelets by anticoagulation protocol: Yes   Plan:  1. Heparin 1000 unit IV bolus, then increase IV infusion to 950 units/hr.  2. Heparin level in 8 hours.   [name] 03/17/2013,2:35 AM

## 2013-03-17 NOTE — Progress Notes (Signed)
Pt brought to 2900 with respiratory distress and placed on BIPAP.  Pt on 15/5 60%. Pt's sats are 95% at this time and doing better.

## 2013-03-18 ENCOUNTER — Encounter (HOSPITAL_COMMUNITY): Payer: Self-pay | Admitting: Radiology

## 2013-03-18 ENCOUNTER — Encounter (HOSPITAL_COMMUNITY): Payer: Self-pay | Admitting: Internal Medicine

## 2013-03-18 DIAGNOSIS — J9600 Acute respiratory failure, unspecified whether with hypoxia or hypercapnia: Secondary | ICD-10-CM

## 2013-03-18 DIAGNOSIS — I4891 Unspecified atrial fibrillation: Secondary | ICD-10-CM

## 2013-03-18 LAB — BLOOD GAS, ARTERIAL
Acid-base deficit: 4 mmol/L — ABNORMAL HIGH (ref 0.0–2.0)
Bicarbonate: 22.3 mEq/L (ref 20.0–24.0)
Delivery systems: POSITIVE
Drawn by: 362771
Expiratory PAP: 5
FIO2: 0.6 %
Mode: POSITIVE
O2 Saturation: 98.9 %
Patient temperature: 98.6
Pressure support: 10 cmH2O
pH, Arterial: 7.178 — CL (ref 7.350–7.450)
pO2, Arterial: 181 mmHg — ABNORMAL HIGH (ref 80.0–100.0)

## 2013-03-18 LAB — BASIC METABOLIC PANEL
Calcium: 9 mg/dL (ref 8.4–10.5)
Creatinine, Ser: 2.19 mg/dL — ABNORMAL HIGH (ref 0.50–1.10)
GFR calc Af Amer: 23 mL/min — ABNORMAL LOW (ref >90)
GFR calc non Af Amer: 20 mL/min — ABNORMAL LOW (ref >90)

## 2013-03-18 LAB — CBC
Platelets: 236 10*3/uL (ref 150–400)
RBC: 3.68 MIL/uL — ABNORMAL LOW (ref 3.87–5.11)
RDW: 13.2 % (ref 11.5–15.5)
WBC: 11.6 10*3/uL — ABNORMAL HIGH (ref 4.0–10.5)

## 2013-03-18 LAB — HEPARIN LEVEL (UNFRACTIONATED): Heparin Unfractionated: 0.65 IU/mL (ref 0.30–0.70)

## 2013-03-18 LAB — PRO B NATRIURETIC PEPTIDE: Pro B Natriuretic peptide (BNP): 7033 pg/mL — ABNORMAL HIGH (ref 0–450)

## 2013-03-18 MED ORDER — FUROSEMIDE 10 MG/ML IJ SOLN: 40.0000 mg | Freq: Once | INTRAMUSCULAR | Status: AC

## 2013-03-18 MED ORDER — WARFARIN VIDEO
Freq: Once | Status: AC
  Administered 2013-03-19: 10:00:00

## 2013-03-18 MED ORDER — FUROSEMIDE 10 MG/ML IJ SOLN
40.0000 mg | Freq: Once | INTRAMUSCULAR | Status: AC
  Administered 2013-03-18: 40 mg via INTRAVENOUS
  Filled 2013-03-18: qty 4

## 2013-03-18 MED ORDER — WARFARIN - PHARMACIST DOSING INPATIENT
Freq: Every day | Status: DC
  Administered 2013-03-20: 18:00:00

## 2013-03-18 MED ORDER — WARFARIN SODIUM 5 MG PO TABS
5.0000 mg | ORAL_TABLET | Freq: Once | ORAL | Status: AC
  Administered 2013-03-18: 5 mg via ORAL
  Filled 2013-03-18: qty 1

## 2013-03-18 MED ORDER — PATIENT'S GUIDE TO USING COUMADIN BOOK
Freq: Once | Status: AC
  Administered 2013-03-18: 12:00:00
  Filled 2013-03-18: qty 1

## 2013-03-18 MED ORDER — FUROSEMIDE 10 MG/ML IJ SOLN
INTRAMUSCULAR | Status: AC
  Administered 2013-03-18: 40 mg via INTRAVENOUS
  Filled 2013-03-18: qty 4

## 2013-03-18 NOTE — Progress Notes (Signed)
Late Entry - 2150, pt called nursing staff to apply oxygen.  Upon entering pt's room, pt sitting on side of bed with increased work of breathing, pale, nauseated, diaphoretic. Oxygen applied per Acacia Villas at 4L, BP 174/88 with heart rate 79, respiratory rate 24-28 per minute.  Lungs to auscultation with bibasilar rales.  MD notified with orders for bipap.  Rapid response RN and RRT notified.  4 mg Zofran IV given per orders for N/V.  Report called to Darel Hong, RN on 2900, message left for pt's daughter regarding pt transfer.  Pt transported with oxygen and zoll to 2923.

## 2013-03-18 NOTE — Progress Notes (Signed)
  Name: [name] MRN: [id number] DOB: 1930/09/30    LOS: 3  PULMONARY / CRITICAL CARE MEDICINE  HPI:  [name] is a 79 y.o. female who was admitted with afib with RVR.  She was bridging heparin to coumadin, and was planned discharge in the morning. This evening, she developed acute nausea and vomiting, blood pressure increased over 200 systolic, and she became dyspneic.  She was transferred to step-down and was started on BiPAP.  She has been somewhat less responsive since the episode.  She did not receive lasix since admission given her creatinine of 2.2, but on review of the record, this is at baseline for her.  Brief patient description:  19 with CHF, stage IV to V CKD, and acute hypercarbic respiratory failure  Vital Signs: Temp:  [97.1 F (36.2 C)-98.6 F (37 C)] 98.6 F (37 C) (04/21 0800) Pulse Rate:  [54-96] 61 (04/21 0849) Resp:  [15-29] 15 (04/21 0849) BP: (95-208)/(42-94) 138/57 mmHg (04/21 0849) SpO2:  [84 %-100 %] 100 % (04/21 0849) FiO2 (%):  [40 %-60 %] 40 % (04/21 0849) Weight:  [61.009 kg (134 lb 8 oz)] 61.009 kg (134 lb 8 oz) (04/21 0408)  Physical Examination: General: No apparent distress. Eyes: Anicteric sclerae. ENT: Oropharynx clear. Moist mucous membranes. No thrush Lymph: No cervical, supraclavicular, or axillary lymphadenopathy. Heart: Normal S1, S2. No murmurs, rubs, or gallops appreciated. No bruits, equal pulses. Lungs: Prolonged expiratory phase, wheezing, mild accessory muscle use. Abdomen: Abdomen soft, non-tender and not distended, normoactive bowel sounds. No hepatosplenomegaly or masses. Musculoskeletal: No clubbing or synovitis. Skin: No rashes or lesions Neuro: No focal neurologic deficits.  Ventilator Settings: Vent Mode:  [-] PSV FiO2 (%):  [40 %-60 %] 40 % Pressure Support:  [10 cmH20] 10 cmH20 CXR:  03/17/13 - increased interstitial markings, consistent with edema ECG:  NSR, rates 60-70s Lines:  None Cultures: None Antibiotics: None  ASSESSMENT AND PLAN Principal Problem:   Atrial fibrillation Active Problems:   CRI (chronic renal insufficiency)   Acute respiratory failure   Acute on chronic combined systolic and diastolic CHF, NYHA class 3  Respiratory failure due to a combination of COPD and CHF, exacerbated by CHF, no evidence of COPD exacerbation to justify need for abx and steroids.   Plan: - D/C BiPAP. - Diureses as BUN:Cr ratio is 15:1. - Bronchodilators as ordered. - IS and flutter valve. - Early mobilization. - Will follow with you.  Alyson Reedy, M.D. [facility] Pulmonary/Critical Care Medicine. Pager: [phone]. After hours pager: [phone].

## 2013-03-18 NOTE — Progress Notes (Addendum)
ANTICOAGULATION CONSULT NOTE - Follow Up Consult  Pharmacy Consult for Heparin>>warfarin Indication: atrial fibrillation  Allergies  Allergen Reactions  . Metoprolol Swelling    Takes bisoprolol without issue  . Norvasc (Amlodipine Besylate) Swelling    Takes nisoldipine without issue  . Sulfonamide Derivatives Hives, Itching and Swelling  . Amiodarone Nausea And Vomiting  . Statins     Myalgias - "I won't take them"    Patient Measurements: Height: 5\' 1"  (154.9 cm) Weight: 134 lb 8 oz (61.009 kg) IBW/kg (Calculated) : 47.8 Heparin Dosing Weight: 60kg  Vital Signs: Temp: 98.6 F (37 C) (04/21 0800) Temp src: Axillary (04/21 0800) BP: 138/57 mmHg (04/21 0849) Pulse Rate: 61 (04/21 0849)  Labs:  Recent Labs  03/15/13 1542 03/15/13 1614  03/16/13 0640 03/16/13 1115 03/16/13 1758 03/17/13 0129 03/17/13 1034 03/18/13 0455  HGB 12.6 12.9  --  11.2*  --   --  11.2*  --  11.0*  HCT 35.6* 38.0  --  31.6*  --   --  32.6*  --  32.1*  PLT 288  --   --  249  --   --  250  --  236  APTT 29  --   --   --   --   --   --   --   --   LABPROT 13.2  --   --   --   --   --   --   --   --   INR 1.01  --   --   --   --   --   --   --   --   HEPARINUNFRC  --   --   < > 0.42  --   --  0.17* 0.64 0.65  CREATININE  --  2.10*  --  2.23*  --   --   --   --  2.19*  TROPONINI  --   --   < >  --  0.64* 0.70* 0.55*  --   --   < > = values in this interval not displayed.  Estimated Creatinine Clearance: 16.9 ml/min (by C-G formula based on Cr of 2.19).   Medications:  Heparin @ 950 units/hr  Assessment: 83yof continues on heparin for afib. Troponins are positive but trending down. Heparin level is therapeutic. Level now stable this morning. CBC stable. No bleeding reported.  Orders to start coumadin tonight. BL INR was 1.0. No interacting drugs on profile.  Goal of Therapy:  Heparin level 0.3-0.7 units/ml INR goal 2-3 Monitor platelets by anticoagulation protocol: Yes   Plan:  1)  Continue heparin at 950 units/hr 2) Follow up heparin level, CBC in AM 3) Warfarin 5mg  tonight  Sheppard Coil PharmD., BCPS Clinical Pharmacist Pager [phone] 03/18/2013 11:20 AM

## 2013-03-18 NOTE — Progress Notes (Addendum)
Subjective: Patient says her breathing is better. Objective: Filed Vitals:   03/18/13 0300 03/18/13 0400 03/18/13 0408 03/18/13 0441  BP: 95/42 113/48 113/48 113/48  Pulse: 54 55 65 56  Temp:   98.4 F (36.9 C)   TempSrc:   Oral   Resp: 19 19 21 19   Height:      Weight:   134 lb 8 oz (61.009 kg)   SpO2: 100% 100% 100% 100%   Weight change:   Intake/Output Summary (Last 24 hours) at 03/18/13 0753 Last data filed at 03/18/13 0700  Gross per 24 hour  Intake 675.21 ml  Output   1200 ml  Net -524.79 ml    General: Alert, awake, oriented x3, in no acute distress Neck:  JVP is normal Heart: Regular rate and rhythm, without murmurs, rubs, gallops.  Lungs: Clear to auscultation.  No rales or wheezes.  Some decreased airflow Exemities:  No edema.   Neuro: Grossly intact, nonfocal.  Tele:  SR Lab Results: Results for orders placed during the hospital encounter of 03/15/13 (from the past 24 hour(s))  HEPARIN LEVEL (UNFRACTIONATED)     Status: None   Collection Time    03/17/13 10:34 AM      Result Value Range   Heparin Unfractionated 0.64  0.30 - 0.70 IU/mL  MRSA PCR SCREENING     Status: None   Collection Time    03/17/13 11:30 PM      Result Value Range   MRSA by PCR NEGATIVE  NEGATIVE  BLOOD GAS, ARTERIAL     Status: Abnormal   Collection Time    03/18/13 12:15 AM      Result Value Range   FIO2 60.00     Delivery systems BILEVEL POSITIVE AIRWAY PRESSURE     Mode BILEVEL POSITIVE AIRWAY PRESSURE     Inspiratory PAP 15     Expiratory PAP 5     pH, Arterial 7.178 (*) 7.350 - 7.450   pCO2 arterial 62.8 (*) 35.0 - 45.0 mmHg   pO2, Arterial 85.6  80.0 - 100.0 mmHg   Bicarbonate 22.4  20.0 - 24.0 mEq/L   TCO2 24.3  0 - 100 mmol/L   Acid-base deficit 4.8 (*) 0.0 - 2.0 mmol/L   O2 Saturation 94.3     Patient temperature 98.6     Collection site RIGHT RADIAL     Drawn by [id number]     Sample type ARTERIAL DRAW     Allens test (pass/fail) PASS  PASS  GLUCOSE, CAPILLARY      Status: Abnormal   Collection Time    03/18/13 12:50 AM      Result Value Range   Glucose-Capillary 133 (*) 70 - 99 mg/dL  BLOOD GAS, ARTERIAL     Status: Abnormal   Collection Time    03/18/13  1:40 AM      Result Value Range   FIO2 0.60     Delivery systems BILEVEL POSITIVE AIRWAY PRESSURE     Inspiratory PAP 15     Expiratory PAP 5     Pressure support 10     pH, Arterial 7.240 (*) 7.350 - 7.450   pCO2 arterial 54.0 (*) 35.0 - 45.0 mmHg   pO2, Arterial 181.0 (*) 80.0 - 100.0 mmHg   Bicarbonate 22.3  20.0 - 24.0 mEq/L   TCO2 24.0  0 - 100 mmol/L   Acid-base deficit 4.0 (*) 0.0 - 2.0 mmol/L   O2 Saturation 98.9     Patient temperature  98.6     Collection site RIGHT RADIAL     Drawn by [phone]     Sample type ARTERIAL DRAW     Allens test (pass/fail) PASS  PASS  HEPARIN LEVEL (UNFRACTIONATED)     Status: None   Collection Time    03/18/13  4:55 AM      Result Value Range   Heparin Unfractionated 0.65  0.30 - 0.70 IU/mL  CBC     Status: Abnormal   Collection Time    03/18/13  4:55 AM      Result Value Range   WBC 11.6 (*) 4.0 - 10.5 K/uL   RBC 3.68 (*) 3.87 - 5.11 MIL/uL   Hemoglobin 11.0 (*) 12.0 - 15.0 g/dL   HCT 60.4 (*) 54.0 - 98.1 %   MCV 87.2  78.0 - 100.0 fL   MCH 29.9  26.0 - 34.0 pg   MCHC 34.3  30.0 - 36.0 g/dL   RDW 19.1  47.8 - 29.5 %   Platelets 236  150 - 400 K/uL  BASIC METABOLIC PANEL     Status: Abnormal   Collection Time    03/18/13  4:55 AM      Result Value Range   Sodium 138  135 - 145 mEq/L   Potassium 4.5  3.5 - 5.1 mEq/L   Chloride 103  96 - 112 mEq/L   CO2 27  19 - 32 mEq/L   Glucose, Bld 111 (*) 70 - 99 mg/dL   BUN 33 (*) 6 - 23 mg/dL   Creatinine, Ser 6.21 (*) 0.50 - 1.10 mg/dL   Calcium 9.0  8.4 - 30.8 mg/dL   GFR calc non Af Amer 20 (*) >90 mL/min   GFR calc Af Amer 23 (*) >90 mL/min    Studies/Results: @RISRSLT24 @  Medications: Reviewed   @PROBHOSP @  1.  DYspnea. Patient reports that last night breathing got worse after  dark  Breathing better now after diuresis.   Denies CP.  Episode may reflect diastolic dysfunction with afib and lasix being held.  Will give additional lasix today. Check BNP Ambulate without O2  2.  Afib  Currently remaining in SR ON heparin.  Discussed with Dayle Points.  WIll d/c plavix and transition to coumadin.  Given recent conversion would recomm bridging.  3.  CAD  Patient had troponin bump  Felt demand related in setting of CAD (see PMH for anatomy).  I am not convinced, even with the spell she had last evening that she is having more unstable symptoms from ischemia  4.  CRI  Follow.  LOS: 3 days   Dietrich Pates 03/18/2013, 7:53 AM

## 2013-03-18 NOTE — Progress Notes (Signed)
Pt taken off bipap and placed on 2L [place]. Pt is not SOB at this time and breathing looks good at this time. RT will continue to monitor.

## 2013-03-18 NOTE — Consult Note (Signed)
Name: [name] MRN: [id number] DOB: June 18, 1930    LOS: 3  PULMONARY / CRITICAL CARE MEDICINE  HPI:  [name] is a 79 y.o. female who was admitted with afib with RVR.  She was bridging heparin to coumadin, and was planned discharge in the morning. This evening, she developed acute nausea and vomiting, blood pressure increased over 200 systolic, and she became dyspneic.  She was transferred to step-down and was started on BiPAP.  She has been somewhat less responsive since the episode.  She did not receive lasix since admission given her creatinine of 2.2, but on review of the record, this is at baseline for her.  Past Medical History  Diagnosis Date  . Lower extremity edema     bilateral  . CKD (chronic kidney disease), stage III     a. baseline creat 1.6->2  . Labile hypertension   . Anxiety   . Failure to thrive in childhood   . History of shingles July 2004    on the left side of her thorax and postherpetic neuralgia  . History of psoriasis   . History of recurrent UTIs   . Major depressive disorder, single episode, severe 05-May-2003    "when my husband died"  . Valvular heart disease     a. 07/2012 Echo in setting of afib: EF 35%, Mild AI/MR, Mod Dil LA, Mod TR,  . CAD (coronary artery disease)     a. NSTEMI 05/2012 s/p DES to OM 06/07/12 (residual severe LCx subbranch dz for med rx unless refractory angina recurs)  . LBBB (left bundle branch block)     intermittent  . Chronic combined systolic and diastolic CHF (congestive heart failure)     a. 05/2012 Echo: EF 60-65%, Gr 1 DD, mild AI/MR, Mod TR;  b. 07/2012 Echo in setting of afib: EF 35%, Mild AI/MR, Mod Dil LA, Mod TR, PASP .  . Atrial fibrillation     a. Dx 07/2012 -> Amio started.  No coumadin 2/2 ongoing DAPT;  b. 08/15/2012 Amio d/c 2/2 nausea - maintaining sinus rhythm.   Past Surgical History  Procedure Laterality Date  . Salpingoophorectomy      "cyst removed"  . Appendectomy    . Breast cyst  excision    . Abdominal hysterectomy    . Cataract extraction, bilateral  ~ 05-04-09  . Cardiac surgery     Prior to Admission medications   Medication Sig Start Date End Date Taking? Authorizing Provider  aspirin EC 81 MG tablet Take 1 tablet (81 mg total) by mouth daily. 06/08/12  Yes Dayna N Dunn, PA-C  b complex vitamins tablet Take 1 tablet by mouth daily.     Yes Historical Provider, MD  bisoprolol (ZEBETA) 5 MG tablet Take 5 mg by mouth daily. 11/13/12  Yes Tonny Bollman, MD  Cholecalciferol (VITAMIN D-3) 5000 UNITS TABS Take 1 tablet by mouth daily.   Yes Historical Provider, MD  clopidogrel (PLAVIX) 75 MG tablet Take 75 mg by mouth daily.   Yes Historical Provider, MD  felodipine (PLENDIL) 5 MG 24 hr tablet Take 5 mg by mouth daily. 01/23/13  Yes [name], MD  fish oil-omega-3 fatty acids 1000 MG capsule Take 1 g by mouth 2 (two) times daily.    Yes Historical Provider, MD  isosorbide mononitrate (IMDUR) 30 MG 24 hr tablet Take 30 mg by mouth daily.   Yes Historical Provider, MD  Boris Lown Oil 300 MG CAPS Take 1 capsule by mouth  daily.    Yes Historical Provider, MD  L-Lysine 500 MG CAPS Take 1 capsule by mouth 2 (two) times daily.   Yes Historical Provider, MD  loratadine (CLARITIN) 10 MG tablet Take 10 mg by mouth daily.   Yes Historical Provider, MD  nitroGLYCERIN (NITROSTAT) 0.4 MG SL tablet Place 1 tablet (0.4 mg total) under the tongue every 5 (five) minutes x 3 doses as needed for chest pain. 06/08/12 06/08/13 Yes Dayna N Dunn, PA-C   Allergies Allergies  Allergen Reactions  . Metoprolol Swelling    Takes bisoprolol without issue  . Norvasc (Amlodipine Besylate) Swelling    Takes nisoldipine without issue  . Sulfonamide Derivatives Hives, Itching and Swelling  . Amiodarone Nausea And Vomiting  . Statins     Myalgias - "I won't take them"    Family History Family History  Problem Relation Age of Onset  . Diabetes    . Cancer    . Lung cancer     Social History   reports that she quit smoking about 33 years ago. Her smoking use included Cigarettes. She has a 45 pack-year smoking history. She has never used smokeless tobacco. She reports that  drinks alcohol. She reports that she does not use illicit drugs.  Review Of Systems:  Unable to provide.  Brief patient description:  25 with CHF, stage IV to V CKD, and acute hypercarbic respiratory failure  Vital Signs: Temp:  [97.1 F (36.2 C)-97.8 F (36.6 C)] 97.1 F (36.2 C) (04/21 0010) Pulse Rate:  [54-96] 63 (04/21 0100) Resp:  [16-29] 24 (04/21 0100) BP: (113-208)/(55-94) 132/66 mmHg (04/21 0100) SpO2:  [84 %-100 %] 100 % (04/21 0100) FiO2 (%):  [60 %] 60 % (04/21 0011)  Physical Examination: General: No apparent distress. Eyes: Anicteric sclerae. ENT: Oropharynx clear. Moist mucous membranes. No thrush Lymph: No cervical, supraclavicular, or axillary lymphadenopathy. Heart: Normal S1, S2. No murmurs, rubs, or gallops appreciated. No bruits, equal pulses. Lungs: Prolonged expiratory phase, wheezing, mild accessory muscle use. Abdomen: Abdomen soft, non-tender and not distended, normoactive bowel sounds. No hepatosplenomegaly or masses. Musculoskeletal: No clubbing or synovitis. Skin: No rashes or lesions Neuro: No focal neurologic deficits.  I have reviewed the labs and personally reviewed the radiology imaging since admission.  Ventilator Settings: Vent Mode:  [-] PSV FiO2 (%):  [60 %] 60 % Pressure Support:  [10 cmH20] 10 cmH20 CXR:  03/17/13 - increased interstitial markings, consistent with edema ECG:  NSR, rates 60-70s Lines: None Cultures: None Antibiotics: None   ASSESSMENT AND PLAN Principal Problem:   Atrial fibrillation Active Problems:   CRI (chronic renal insufficiency)   Acute respiratory failure   Acute on chronic combined systolic and diastolic CHF, NYHA class 3  I would like to hold off intubation if possible.  Will give 40 mg lasix, and give her a trial of  BiPAP.  Vomiting is concerning, but so far no issues since first episode.  Blood pressure is coming down and will monitor.  Attempted to reach family without success.  Will check blood gas, and if worsening or not improving, will consider intubation.  She definitely appears more comfortable than when I first encountered her.  Lavella Hammock, M.D. Pulmonary and Critical Care Medicine [facility] Pager: [phone]  03/18/2013, 1:14 AM

## 2013-03-19 LAB — PROTIME-INR
INR: 1.1 (ref 0.00–1.49)
Prothrombin Time: 14.1 seconds (ref 11.6–15.2)

## 2013-03-19 LAB — HEPARIN LEVEL (UNFRACTIONATED): Heparin Unfractionated: 0.55 IU/mL (ref 0.30–0.70)

## 2013-03-19 LAB — CBC
Hemoglobin: 11 g/dL — ABNORMAL LOW (ref 12.0–15.0)
MCHC: 34.2 g/dL (ref 30.0–36.0)

## 2013-03-19 MED ORDER — WARFARIN SODIUM 5 MG PO TABS
5.0000 mg | ORAL_TABLET | Freq: Once | ORAL | Status: AC
  Administered 2013-03-19: 5 mg via ORAL
  Filled 2013-03-19: qty 1

## 2013-03-19 NOTE — Progress Notes (Signed)
  Name: [name] MRN: [id number] DOB: 12-26-1929    LOS: 4  PULMONARY / CRITICAL CARE MEDICINE  HPI:  [name] is a 79 y.o. female who was admitted with afib with RVR.  She was bridging heparin to coumadin, and was planned discharge in the morning. This evening, she developed acute nausea and vomiting, blood pressure increased over 200 systolic, and she became dyspneic.  She was transferred to step-down and was started on BiPAP.  She has been somewhat less responsive since the episode.  She did not receive lasix since admission given her creatinine of 2.2, but on review of the record, this is at baseline for her.  Brief patient description:  37 with CHF, stage IV to V CKD, and acute hypercarbic respiratory failure  Vital Signs: Temp:  [98.2 F (36.8 C)-98.8 F (37.1 C)] 98.5 F (36.9 C) (04/22 0745) Pulse Rate:  [56-69] 56 (04/22 0745) Resp:  [16-22] 18 (04/22 0800) BP: (97-149)/(41-72) 142/55 mmHg (04/22 0800) SpO2:  [92 %-100 %] 95 % (04/22 0745)  Physical Examination: General: No apparent distress. Eyes: Anicteric sclerae. ENT: Oropharynx clear. Moist mucous membranes. No thrush Lymph: No cervical, supraclavicular, or axillary lymphadenopathy. Heart: Normal S1, S2. No murmurs, rubs, or gallops appreciated. No bruits, equal pulses. Lungs: Prolonged expiratory phase, wheezing, mild accessory muscle use. Abdomen: Abdomen soft, non-tender and not distended, normoactive bowel sounds. No hepatosplenomegaly or masses. Musculoskeletal: No clubbing or synovitis. Skin: No rashes or lesions Neuro: No focal neurologic deficits.  Ventilator Settings:   CXR:  03/17/13 - increased interstitial markings, consistent with edema ECG:  NSR, rates 60-70s Lines: None Cultures: None Antibiotics: None  BMET    Component Value Date/Time   NA 138 03/18/2013 0455   K 4.5 03/18/2013 0455   CL 103 03/18/2013 0455   CO2 27 03/18/2013 0455   GLUCOSE 111* 03/18/2013 0455   BUN 33*  03/18/2013 0455   CREATININE 2.19* 03/18/2013 0455   CALCIUM 9.0 03/18/2013 0455   GFRNONAA 20* 03/18/2013 0455   GFRAA 23* 03/18/2013 0455   CBC    Component Value Date/Time   WBC 7.9 03/19/2013 0445   RBC 3.71* 03/19/2013 0445   HGB 11.0* 03/19/2013 0445   HCT 32.2* 03/19/2013 0445   PLT 242 03/19/2013 0445   MCV 86.8 03/19/2013 0445   MCH 29.6 03/19/2013 0445   MCHC 34.2 03/19/2013 0445   RDW 13.0 03/19/2013 0445   LYMPHSABS 1.4 03/15/2013 1542   MONOABS 1.0 03/15/2013 1542   EOSABS 0.3 03/15/2013 1542   BASOSABS 0.1 03/15/2013 1542    ASSESSMENT AND PLAN Principal Problem:   Atrial fibrillation Active Problems:   CRI (chronic renal insufficiency)   Acute respiratory failure   Acute on chronic combined systolic and diastolic CHF, NYHA class 3  Respiratory failure due to a combination of COPD and CHF, exacerbated by CHF, no evidence of COPD exacerbation to justify need for abx and steroids.   Plan: - D/C BiPAP. - Hold further diureses. - Bronchodilators as ordered. - IS and flutter valve. - Early mobilization. - PCCM will sign off, please call back if needed.  Alyson Reedy, M.D. [facility] Pulmonary/Critical Care Medicine. Pager: [phone]. After hours pager: [phone].

## 2013-03-19 NOTE — Progress Notes (Signed)
ANTICOAGULATION CONSULT NOTE - Follow Up Consult  Pharmacy Consult for Heparin>>warfarin Indication: atrial fibrillation  Allergies  Allergen Reactions  . Metoprolol Swelling    Takes bisoprolol without issue  . Norvasc (Amlodipine Besylate) Swelling    Takes nisoldipine without issue  . Sulfonamide Derivatives Hives, Itching and Swelling  . Amiodarone Nausea And Vomiting  . Statins     Myalgias - "I won't take them"    Patient Measurements: Height: 5\' 1"  (154.9 cm) Weight: 134 lb 8 oz (61.009 kg) IBW/kg (Calculated) : 47.8 Heparin Dosing Weight: 60kg  Vital Signs: Temp: 98.5 F (36.9 C) (04/22 0745) Temp src: Oral (04/22 0745) BP: 142/55 mmHg (04/22 0800) Pulse Rate: 56 (04/22 0745)  Labs:  Recent Labs  03/16/13 1115 03/16/13 1758  03/17/13 0129 03/17/13 1034 03/18/13 0455 03/19/13 0445  HGB  --   --   < > 11.2*  --  11.0* 11.0*  HCT  --   --   --  32.6*  --  32.1* 32.2*  PLT  --   --   --  250  --  236 242  LABPROT  --   --   --   --   --   --  14.1  INR  --   --   --   --   --   --  1.10  HEPARINUNFRC  --   --   < > 0.17* 0.64 0.65 0.55  CREATININE  --   --   --   --   --  2.19*  --   TROPONINI 0.64* 0.70*  --  0.55*  --   --   --   < > = values in this interval not displayed.  Estimated Creatinine Clearance: 16.9 ml/min (by C-G formula based on Cr of 2.19).   Medications:  Heparin @ 950 units/hr  Assessment: [name] continues on heparin bridge to warfarin for afib. No immediate plans for cath. Heparin level is therapeutic. INR shows slight trend up from baseline. CBC stable. No bleeding reported.  Goal of Therapy:  Heparin level 0.3-0.7 units/ml INR goal 2-3 Monitor platelets by anticoagulation protocol: Yes   Plan:  1) Continue heparin at 950 units/hr 2) Follow up heparin level, CBC in AM 3) Warfarin 5mg  tonight  Sheppard Coil PharmD., BCPS Clinical Pharmacist Pager [phone] 03/19/2013 8:56 AM

## 2013-03-19 NOTE — Progress Notes (Signed)
1100  Transferred in from 2900 via wheelchair .

## 2013-03-19 NOTE — Progress Notes (Signed)
Patient evaluated for community based chronic disease management services with [facility] Care Management Program as a benefit of patient's [employer]. Spoke with patient at bedside to explain [facility] Care Management services. Patient has declined services at this time.  She agreed to provide her daughter with the service literature for review. Left contact information and [facility] literature at bedside. Made inpatient Case Manager aware that [facility] Care Management consult. Of note, [facility] Care Management services does not replace or interfere with any services that are arranged by inpatient case management or social work.  For additional questions or referrals please contact Anibal Henderson BSN RN [facility] Liaison at [phone].

## 2013-03-19 NOTE — Progress Notes (Signed)
    SUBJECTIVE: No chest pain or SOB this am.   BP 123/49  Pulse 68  Temp(Src) 98.8 F (37.1 C) (Oral)  Resp 18  Ht 5\' 1"  (1.549 m)  Wt 134 lb 8 oz (61.009 kg)  BMI 25.43 kg/m2  SpO2 92%  Intake/Output Summary (Last 24 hours) at 03/19/13 1478 Last data filed at 03/19/13 0600  Gross per 24 hour  Intake 1505.17 ml  Output   1925 ml  Net -419.83 ml    PHYSICAL EXAM General: Well developed, well nourished, in no acute distress. Alert and oriented x 3.  Psych:  Flat affect, responds appropriately Neck: No JVD. No masses noted.  Lungs: Clear bilaterally with no wheezes or rhonci noted.  Heart: RRR with no murmurs noted. Abdomen: Bowel sounds are present. Soft, non-tender.  Extremities: No lower extremity edema.   LABS: Basic Metabolic Panel:  Recent Labs  29/56/21 0455  NA 138  K 4.5  CL 103  CO2 27  GLUCOSE 111*  BUN 33*  CREATININE 2.19*  CALCIUM 9.0   CBC:  Recent Labs  03/18/13 0455 03/19/13 0445  WBC 11.6* 7.9  HGB 11.0* 11.0*  HCT 32.1* 32.2*  MCV 87.2 86.8  PLT 236 242   Cardiac Enzymes:  Recent Labs  03/16/13 1115 03/16/13 1758 03/17/13 0129  TROPONINI 0.64* 0.70* 0.55*    Current Meds: . aspirin EC  81 mg Oral Daily  . bisoprolol  10 mg Oral Daily  . diltiazem  30 mg Oral Q6H  . isosorbide mononitrate  30 mg Oral Daily  . levalbuterol  0.63 mg Nebulization Q6H  . loratadine  10 mg Oral Daily  . sodium chloride  3 mL Intravenous Q12H  . warfarin   Does not apply Once  . Warfarin - Pharmacist Dosing Inpatient   Does not apply q1800   ASSESSMENT AND PLAN:  1. CAD: She is s/p stenting in July 2013 ( [facility]). Mild troponin elevation in setting of atrial fib with RVR, likely demand related. Her Plavix has been stopped and she is now on ASA, heparin bridge, coumadin.  She has stage IV CKD and does not want to have a cath unless it is absolutely necessary.  2. Atrial fibrillation: Now in sinus. Continue bisoprolol. Continue heparin bridge  while awaiting therapeutic INR on coumadin (INR is 1.1 this am)  3. Combined systolic and diastolic CHF:  Volume status is ok this am. She is not a candidate for ARB or ACE-inh.with renal insufficiency. She is not on chronic Lasix therapy.   4. CKD:  Renal function is stable.   5. Dispo: Will transfer to telemetry today. Plan will be to bridge with heparin until INR is therapeutic with recent conversion to sinus, risk of CVA.   [name]  4/22/20147:22 AM

## 2013-03-20 LAB — BASIC METABOLIC PANEL
BUN: 41 mg/dL — ABNORMAL HIGH (ref 6–23)
Calcium: 9.2 mg/dL (ref 8.4–10.5)
Creatinine, Ser: 2.48 mg/dL — ABNORMAL HIGH (ref 0.50–1.10)
GFR calc Af Amer: 20 mL/min — ABNORMAL LOW (ref >90)
GFR calc non Af Amer: 17 mL/min — ABNORMAL LOW (ref >90)
Potassium: 4.5 mEq/L (ref 3.5–5.1)

## 2013-03-20 LAB — CBC
MCH: 30.2 pg (ref 26.0–34.0)
MCHC: 34.5 g/dL (ref 30.0–36.0)
Platelets: 237 10*3/uL (ref 150–400)
RBC: 3.64 MIL/uL — ABNORMAL LOW (ref 3.87–5.11)
RDW: 13.2 % (ref 11.5–15.5)

## 2013-03-20 LAB — PROTIME-INR: Prothrombin Time: 14.2 seconds (ref 11.6–15.2)

## 2013-03-20 MED ORDER — DILTIAZEM HCL ER COATED BEADS 120 MG PO CP24
120.0000 mg | ORAL_CAPSULE | Freq: Every day | ORAL | Status: DC
  Administered 2013-03-21 – 2013-03-22 (×2): 120 mg via ORAL
  Filled 2013-03-20 (×2): qty 1

## 2013-03-20 MED ORDER — DILTIAZEM HCL ER COATED BEADS 180 MG PO CP24
180.0000 mg | ORAL_CAPSULE | Freq: Every day | ORAL | Status: DC
  Administered 2013-03-20: 180 mg via ORAL
  Filled 2013-03-20 (×3): qty 1

## 2013-03-20 MED ORDER — WARFARIN SODIUM 7.5 MG PO TABS
7.5000 mg | ORAL_TABLET | ORAL | Status: AC
  Administered 2013-03-20: 7.5 mg via ORAL
  Filled 2013-03-20: qty 1

## 2013-03-20 NOTE — Progress Notes (Signed)
Went in this morning to assess patient. Took blood pressure in left arm: B/P 174/63. Took again in right arm: B/P: 156/70. Physician came up to assess patient. 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 Social work should possibly be involved for family and care of grandson. Nurse was notified of B/P and care of grandson at home.

## 2013-03-20 NOTE — Progress Notes (Signed)
    SUBJECTIVE: Feels well. No chest pain or SOB.   BP 174/63  Pulse 65  Temp(Src) 98.3 F (36.8 C) (Oral)  Resp 18  Ht 5\' 1"  (1.549 m)  Wt 131 lb 3.2 oz (59.512 kg)  BMI 24.8 kg/m2  SpO2 96%  Intake/Output Summary (Last 24 hours) at 03/20/13 0743 Last data filed at 03/20/13 4098  Gross per 24 hour  Intake  819.5 ml  Output   1375 ml  Net -555.5 ml    PHYSICAL EXAM General: Well developed, well nourished, in no acute distress. Alert and oriented x 3.  Psych:  Good affect, responds appropriately Neck: No JVD. No masses noted.  Lungs: Clear bilaterally with no wheezes or rhonci noted.  Heart: RRR with no murmurs noted. Abdomen: Bowel sounds are present. Soft, non-tender.  Extremities: No lower extremity edema.   LABS: Basic Metabolic Panel:  Recent Labs  11/91/47 0455 03/20/13 0535  NA 138 138  K 4.5 4.5  CL 103 102  CO2 27 29  GLUCOSE 111* 100*  BUN 33* 41*  CREATININE 2.19* 2.48*  CALCIUM 9.0 9.2   CBC:  Recent Labs  03/19/13 0445 03/20/13 0535  WBC 7.9 7.4  HGB 11.0* 11.0*  HCT 32.2* 31.9*  MCV 86.8 87.6  PLT 242 237   Current Meds: . aspirin EC  81 mg Oral Daily  . bisoprolol  10 mg Oral Daily  . diltiazem  30 mg Oral Q6H  . isosorbide mononitrate  30 mg Oral Daily  . levalbuterol  0.63 mg Nebulization Q6H  . loratadine  10 mg Oral Daily  . sodium chloride  3 mL Intravenous Q12H  . Warfarin - Pharmacist Dosing Inpatient   Does not apply q1800     ASSESSMENT AND PLAN:  1. CAD: She is s/p stenting in July 2013 ( [facility]). Mild troponin elevation in setting of atrial fib with RVR, likely demand related. Her Plavix has been stopped and she is now on ASA, heparin bridge, coumadin. She has stage IV CKD and does not want to have a cath unless it is absolutely necessary.   2. Atrial fibrillation: Now in sinus. Continue bisoprolol. Convert diltiazem to long acting Cardizem CD. Continue heparin bridge while awaiting therapeutic INR on coumadin (INR is  1.1 this am) per plans of Dr. Tenny Craw this week.   3. Combined systolic and diastolic CHF: Volume status is ok this am. She is not a candidate for ARB or ACE-inh.with renal insufficiency. She is not on chronic Lasix therapy.   4. CKD: Renal function is stable.   5. Dispo: Plan is in place per Dr. Tenny Craw on Monday to bridge with heparin until INR is therapeutic on coumadin with recent conversion to sinus, risk of CVA.     MCALHANY,CHRISTOPHER  4/23/20147:43 AM

## 2013-03-20 NOTE — Progress Notes (Signed)
ANTICOAGULATION CONSULT NOTE - Follow Up Consult  Pharmacy Consult for Coumadin with Heparin bridging  Indication: atrial fibrillation w/RVR  Heparin Dosing Weight: 60 kg  Labs:  Recent Labs  03/18/13 0455 03/19/13 0445 03/20/13 0535  HGB 11.0* 11.0* 11.0*  HCT 32.1* 32.2* 31.9*  PLT 236 242 237  INR  --  1.10 1.11  HEPARINUNFRC 0.65 0.55 0.52  CREATININE 2.19*  --  2.48*   Lab Results  Component Value Date   INR 1.11 03/20/2013   INR 1.10 03/19/2013   INR 1.01 03/15/2013   Estimated Creatinine Clearance: 14.7 ml/min (by C-G formula based on Cr of 2.48).  Medications:  Scheduled:  . aspirin EC  81 mg Oral Daily  . bisoprolol  10 mg Oral Daily  . diltiazem  180 mg Oral Daily  . isosorbide mononitrate  30 mg Oral Daily  . levalbuterol  0.63 mg Nebulization Q6H  . loratadine  10 mg Oral Daily  . sodium chloride  3 mL Intravenous Q12H  . [COMPLETED] warfarin  5 mg Oral ONCE-1800  . Warfarin - Pharmacist Dosing Inpatient   Does not apply q1800  . [DISCONTINUED] diltiazem  30 mg Oral Q6H   Infusions:  . heparin 950 Units/hr (03/20/13 0357)   Assessment:  79 y/o Female continues on heparin bridging until INR therapeutic for atrial fibrillation w/RVR.  Heparin level within therapeutic range 0.52 units/ml.  INR unchanged after 2 doses of Coumadin.  No bleeding complications noted   Goal of Therapy:  INR 2-3  Heparin level 0.3-0.7 units/ml   Plan:   Continue Heparin infusion at the current rate.   Coumadin 7.5 mg today, will give now.  Daily Heparin level, INR, CBC.   Monitor for bleeding complications   Laurena Bering, Pharm.D.  03/20/2013,11:06 AM

## 2013-03-20 NOTE — Progress Notes (Signed)
Patient's heart rate decrease to the low 40s. Patient is asymptomatic. No signs or symptoms of distress or discomfort. BP is 139/72 and HR at that time is 47. 12-Lead EKG obtained to determine if a change has occurred, EKG showed left bundle branch with SB. PA notified. New orders given. Will continue to monitor patient for further changes in condition.

## 2013-03-20 NOTE — Progress Notes (Signed)
Rec'd a call from RN re: bradycardia. Pt HR has been in the 40s, BP is lower than previous readings but still WNL. Pt mentating well.  ECG pending.   Will decrease Cardizem (new for her) to 120 mg daily. Continue to follow HR on telemetry. Review ECG and make sure pt maintaining SR.   Further treatment changes depending on pt symptoms and ECG results.

## 2013-03-21 DIAGNOSIS — I5021 Acute systolic (congestive) heart failure: Secondary | ICD-10-CM

## 2013-03-21 LAB — CBC
Hemoglobin: 11.2 g/dL — ABNORMAL LOW (ref 12.0–15.0)
MCH: 30.9 pg (ref 26.0–34.0)
MCV: 87.6 fL (ref 78.0–100.0)
RBC: 3.63 MIL/uL — ABNORMAL LOW (ref 3.87–5.11)
WBC: 6.9 10*3/uL (ref 4.0–10.5)

## 2013-03-21 LAB — BASIC METABOLIC PANEL
BUN: 37 mg/dL — ABNORMAL HIGH (ref 6–23)
BUN: 37 mg/dL — ABNORMAL HIGH (ref 6–23)
Chloride: 100 mEq/L (ref 96–112)
Chloride: 97 mEq/L (ref 96–112)
GFR calc Af Amer: 23 mL/min — ABNORMAL LOW (ref >90)
GFR calc Af Amer: 22 mL/min — ABNORMAL LOW (ref >90)
GFR calc non Af Amer: 19 mL/min — ABNORMAL LOW (ref >90)
Glucose, Bld: 92 mg/dL (ref 70–99)
Potassium: 4.5 mEq/L (ref 3.5–5.1)
Potassium: 4.4 mEq/L (ref 3.5–5.1)
Sodium: 138 mEq/L (ref 135–145)
Sodium: 134 mEq/L — ABNORMAL LOW (ref 135–145)

## 2013-03-21 LAB — PROTIME-INR: Prothrombin Time: 17.1 seconds — ABNORMAL HIGH (ref 11.6–15.2)

## 2013-03-21 MED ORDER — LEVALBUTEROL HCL 0.63 MG/3ML IN NEBU: 0.6300 mg | INHALATION_SOLUTION | Freq: Four times a day (QID) | RESPIRATORY_TRACT | Status: DC | PRN

## 2013-03-21 MED ORDER — WARFARIN SODIUM 7.5 MG PO TABS
7.5000 mg | ORAL_TABLET | Freq: Once | ORAL | Status: AC
  Administered 2013-03-21: 7.5 mg via ORAL
  Filled 2013-03-21: qty 1

## 2013-03-21 NOTE — Progress Notes (Signed)
**Note De-Identified [name] Obfuscation** RT protocol assessment note: per protocol Xopenex .63 Q6 HHN is discontinued to Q6 PRN for SOB and Wheezing. BBS clear, VS WNL, SAT on RA 98%. Patient sitting in chair and ambulatory.  CXR 03/17/13 impression: Right lung opacity.  Patient has 30 pack year smoking history; she states she quit in 1980.

## 2013-03-21 NOTE — Progress Notes (Signed)
03/21/13/ 1020 In to speak with pt. about home health sevices.  At this time, the pt. does not want any home health services.  Pt. admits to obtaining her medications, and she has a walker at home.  Pt. takes care of her great-grandson, who has Cerebral Palsy and is legally blind.  She has concerns of transportation for him with [employer].  Will attempt to assist pt. with transportation for great-grandson.   Tera Mater, RN, BSN NCM [phone]

## 2013-03-21 NOTE — Progress Notes (Signed)
ANTICOAGULATION CONSULT NOTE - Follow Up Consult   Pharmacy Consult for Coumadin with Heparin bridging  Indication: atrial fibrillation w/RVR   Heparin Dosing Weight: 60 kg  Labs:  Recent Labs  03/19/13 0445 03/20/13 0535 03/21/13 0435  HGB 11.0* 11.0* 11.2*  HCT 32.2* 31.9* 31.8*  PLT 242 237 248  INR 1.10 1.11 1.43  HEPARINUNFRC 0.55 0.52 0.50  CREATININE  --  2.48* 2.20*   Lab Results  Component Value Date   INR 1.43 03/21/2013   INR 1.11 03/20/2013   INR 1.10 03/19/2013   Estimated Creatinine Clearance: 16.7 ml/min (by C-G formula based on Cr of 2.2).  Medications:  Scheduled:  . aspirin EC  81 mg Oral Daily  . bisoprolol  10 mg Oral Daily  . diltiazem  120 mg Oral Daily  . isosorbide mononitrate  30 mg Oral Daily  . levalbuterol  0.63 mg Nebulization Q6H  . loratadine  10 mg Oral Daily  . sodium chloride  3 mL Intravenous Q12H  . [COMPLETED] warfarin  7.5 mg Oral NOW  . Warfarin - Pharmacist Dosing Inpatient   Does not apply q1800  . [DISCONTINUED] diltiazem  180 mg Oral Daily   Infusions:  . heparin 950 Units/hr (03/21/13 2536)   Assessment:  79 y/o Female continues on heparin bridging until INR therapeutic for atrial fibrillation w/RVR.   Heparin level within therapeutic range 0.5 units/ml.  No bleeding complications noted   Goal of Therapy:  INR 2-3  Heparin level 0.3-0.7 units/ml   Plan:   Continue Heparin infusion at the current rate.   Repeat Coumadin 7.5 mg x 1.  Daily Heparin level, INR, CBC, Monitor for bleeding complications   Laurena Bering, Pharm.D.  03/21/2013,8:16 AM

## 2013-03-21 NOTE — Progress Notes (Addendum)
Subjective: Patient denies CP or SOB  Feels very good  Eager to go home. Objective: Filed Vitals:   03/20/13 2131 03/20/13 2200 03/21/13 0238 03/21/13 0519  BP:  164/72  131/58  Pulse: 62 59 64 56  Temp:  97.8 F (36.6 C)  98.2 F (36.8 C)  TempSrc:  Oral  Oral  Resp: 18 16 17 16   Height:      Weight:    133 lb (60.328 kg)  SpO2: 96% 98% 97% 94%   Weight change: 12.8 oz (0.363 kg)  Intake/Output Summary (Last 24 hours) at 03/21/13 0842 Last data filed at 03/21/13 0813  Gross per 24 hour  Intake 1550.53 ml  Output   1500 ml  Net  50.53 ml    General: Alert, awake, oriented x3, in no acute distress Neck:  JVP is normal Heart: Regular rate and rhythm, without murmurs, rubs, gallops.  Lungs: Clear to auscultation.  No rales or wheezes. Exemities:  No edema.   Neuro: Grossly intact, nonfocal.  Tele:  SR 50s to 60s Lab Results: Results for orders placed during the hospital encounter of 03/15/13 (from the past 24 hour(s))  HEPARIN LEVEL (UNFRACTIONATED)     Status: None   Collection Time    03/21/13  4:35 AM      Result Value Range   Heparin Unfractionated 0.50  0.30 - 0.70 IU/mL  CBC     Status: Abnormal   Collection Time    03/21/13  4:35 AM      Result Value Range   WBC 6.9  4.0 - 10.5 K/uL   RBC 3.63 (*) 3.87 - 5.11 MIL/uL   Hemoglobin 11.2 (*) 12.0 - 15.0 g/dL   HCT 96.0 (*) 45.4 - 09.8 %   MCV 87.6  78.0 - 100.0 fL   MCH 30.9  26.0 - 34.0 pg   MCHC 35.2  30.0 - 36.0 g/dL   RDW 11.9  14.7 - 82.9 %   Platelets 248  150 - 400 K/uL  PROTIME-INR     Status: Abnormal   Collection Time    03/21/13  4:35 AM      Result Value Range   Prothrombin Time 17.1 (*) 11.6 - 15.2 seconds   INR 1.43  0.00 - 1.49  BASIC METABOLIC PANEL     Status: Abnormal   Collection Time    03/21/13  4:35 AM      Result Value Range   Sodium 138  135 - 145 mEq/L   Potassium 4.5  3.5 - 5.1 mEq/L   Chloride 100  96 - 112 mEq/L   CO2 26  19 - 32 mEq/L   Glucose, Bld 92  70 - 99 mg/dL   BUN 37 (*) 6 - 23 mg/dL   Creatinine, Ser 5.62 (*) 0.50 - 1.10 mg/dL   Calcium 9.5  8.4 - 13.0 mg/dL   GFR calc non Af Amer 20 (*) >90 mL/min   GFR calc Af Amer 23 (*) >90 mL/min    Studies/Results: @RISRSLT24 @  Medications: Reviewed  Problems  1.  Atrial fibrillation  Remains in SR  Asymptomatic  On heparin  Contnue coumadin load.  INR greater than 2 before d/cing dual anticoagulation given recent conversion to SR>  2.  CAD  No symptoms of angina  Hx of PTCA/Xience stents (July 2013).  Mild bump in troponin felt to be demand ischemia with afib.  3.  CRI  Stable  LOS: 6 days   Dietrich Pates 03/21/2013, 8:42  AM  Will review with pharmacy qd dosing of lovenox if she does not go above 2 tomorrow.

## 2013-03-22 LAB — CBC
Hemoglobin: 10.9 g/dL — ABNORMAL LOW (ref 12.0–15.0)
MCH: 30.2 pg (ref 26.0–34.0)
Platelets: 242 10*3/uL (ref 150–400)
RBC: 3.61 MIL/uL — ABNORMAL LOW (ref 3.87–5.11)
WBC: 7.7 10*3/uL (ref 4.0–10.5)

## 2013-03-22 LAB — PROTIME-INR
INR: 1.98 — ABNORMAL HIGH (ref 0.00–1.49)
Prothrombin Time: 21.7 seconds — ABNORMAL HIGH (ref 11.6–15.2)

## 2013-03-22 LAB — HEPARIN LEVEL (UNFRACTIONATED): Heparin Unfractionated: 0.88 IU/mL — ABNORMAL HIGH (ref 0.30–0.70)

## 2013-03-22 MED ORDER — WARFARIN SODIUM 5 MG PO TABS: 5.0000 mg | ORAL_TABLET | Freq: Every day | ORAL | Status: DC

## 2013-03-22 MED ORDER — WARFARIN SODIUM 5 MG PO TABS
5.0000 mg | ORAL_TABLET | Freq: Once | ORAL | Status: AC
  Administered 2013-03-22: 5 mg via ORAL
  Filled 2013-03-22: qty 1

## 2013-03-22 MED ORDER — DILTIAZEM HCL ER COATED BEADS 120 MG PO CP24: 120.0000 mg | ORAL_CAPSULE | Freq: Every day | ORAL | Status: DC

## 2013-03-22 MED ORDER — BISOPROLOL FUMARATE 10 MG PO TABS: 10.0000 mg | ORAL_TABLET | Freq: Every day | ORAL | Status: DC

## 2013-03-22 NOTE — Discharge Summary (Signed)
CARDIOLOGY DISCHARGE SUMMARY   Patient ID: [name] MRN: [id number] DOB/AGE: 12/02/1929 79 y.o.  Admit date: 03/15/2013 Discharge date: 03/22/2013  Primary Discharge Diagnosis:    Atrial fibrillation RVR Secondary Discharge Diagnosis:    CRI (chronic renal insufficiency) stage IV   Acute respiratory failure   Acute on chronic combined systolic and diastolic CHF, NYHA class 3   Anticoagulation   CAD  Consults: CCM  Procedures:  2D echocardiogram  Hospital Course: [name] is a 79 y.o. female with a history of CAD and PAF. She woke on the day of admission with palpitations and angina. She came to the emergency room where she was in atrial fibrillation with rapid ventricular response. She had a mildly elevated troponin as well. She was admitted for further evaluation and treatment.  1. Atrial fibrillation with rapid ventricular response: She had been on bisoprolol prior to admission. This was increased to help with rate control as she had not previously tolerated metoprolol. She was also started on short-acting oral Cardizem. She spontaneously converted to sinus rhythm. The Cardizem was increased to 180 mg daily. However, when in sinus rhythm, she developed bradycardia. Therefore, the dose was decreased to 120 mg daily. She tolerated the bisoprolol and the Cardizem at this dose.  2.  Acute on chronic combined systolic and diastolic CHF, NYHA class 3: She was initially given IV Lasix because of her shortness of breath. Because of her renal insufficiency, she is not a candidate for an ARB or an ACE inhibitor. She is on a beta blocker. Her EF had improved on the bisoprolol, and was 55% on an echo just prior to admission. She had an episode of volume overload and required IV Lasix again during her hospital stay. Her volume status was followed closely and at discharge she was considered at baseline with oxygen saturations of 96% on room air and a discharge weight of 131  pounds.  3. chronic renal insufficiency stage IV : On admission, her BUN was 29 with a creatinine of 2.10. Previous data were reviewed and this is baseline for her. Her BUN and creatinine were followed closely during her hospital stay. At discharge her BUN was 37 with a creatinine of 2.29.  4. acute respiratory failure: On 03/18/2013, the patient developed increasing shortness of breath. She was nauseated and diaphoretic. She was placed on BiPAP and critical care medicine consult was called. She was seen by Dr. Tula Nakayama with the critical care service. She was given IV Lasix. She had also been hypertensive. With the IV Lasix in the BiPAP, her respiratory status and her blood pressure improved. An ABG was performed which showed good oxygen saturation but she was acidotic. However, her condition gradually improved and she did not require intubation.  5. Anticoagulation: On admission, she had been on aspirin and Plavix because of a drug-eluting stent in 2013. Dr. Excell Seltzer had considered stopping the Plavix early if she needed further anticoagulation with Coumadin. Because of her atrial fibrillation she was started on heparin and transitioned to Coumadin. At discharge, her INR is 1.98. She is to get an INR in the [place] office next week. She is currently on aspirin 81 mg daily and heparin.  6. CAD: The patient has a history of coronary artery disease and received a drug-eluting stent in July of 2013. Her cardiac enzymes were cycled and were elevated but this was felt secondary to a combination of renal insufficiency and global ischemia from her rapid atrial fibrillation. She had no  ongoing ischemic symptoms and because of her renal insufficiency, cardiac catheterization was not felt absolutely necessary and not performed.  By 03/22/2013, her Coumadin was therapeutic and it was felt safe to discontinue the heparin. Her general medical condition was greatly improved and Dr. Tenny Craw considered her stable for discharge,  to follow up as an outpatient.  Labs: ABG    Component Value Date/Time   PHART 7.240* 03/18/2013 0140   PCO2ART 54.0* 03/18/2013 0140   PO2ART 181.0* 03/18/2013 0140   HCO3 22.3 03/18/2013 0140   TCO2 24.0 03/18/2013 0140   ACIDBASEDEF 4.0* 03/18/2013 0140   O2SAT 98.9 03/18/2013 0140    Lab Results  Component Value Date   WBC 7.7 03/22/2013   HGB 10.9* 03/22/2013   HCT 31.7* 03/22/2013   MCV 87.8 03/22/2013   PLT 242 03/22/2013     Recent Labs Lab 03/21/13 1700  NA 134*  K 4.4  CL 97  CO2 27  BUN 37*  CREATININE 2.29*  CALCIUM 9.4  GLUCOSE 98   Lipid Panel     Component Value Date/Time   CHOL 223* 03/16/2013 0640   TRIG 119 03/16/2013 0640   HDL 59 03/16/2013 0640   CHOLHDL 3.8 03/16/2013 0640   VLDL 24 03/16/2013 0640   LDLCALC 140* 03/16/2013 0640    Pro B Natriuretic peptide (BNP)  Date/Time Value Range Status  03/21/2013  4:45 PM 2058.0* 0 - 450 pg/mL Final  03/18/2013  4:55 AM 7033.0* 0 - 450 pg/mL Final    Recent Labs  03/22/13 0445  INR 1.98*      Radiology: Dg Chest Port 1 View 03/17/2013  *RADIOLOGY REPORT*  Clinical Data: Shortness of breath  PORTABLE CHEST - 1 VIEW  Comparison: 03/15/2013  Findings: Prominent cardiomediastinal contours.  Peribronchial thickening and interstitial prominence.  More confluent right lung base opacity.  No definite pleural effusion.  No pneumothorax. Osteopenia.  Aortic atherosclerosis.  No interval osseous change.  IMPRESSION: Interstitial prominence, peribronchial thickening, and right lung base opacity. Given the short interval development, favored to reflect pulmonary edema and atelectasis.  Infection not excluded in the appropriate clinical setting.   Original Report Authenticated By: Jearld Lesch, M.D.    Dg Chest Port 1 View 03/15/2013  *RADIOLOGY REPORT*  Clinical Data: 79 year old female with chest and left arm pain. History of atrial fibrillation.  PORTABLE CHEST - 1 VIEW  Comparison: 08/17/2012 and earlier.  Findings:  Portable semi upright AP view 1558 hours.  Stable large lung volumes. Stable cardiomegaly and mediastinal contours. Interval improved retrocardiac ventilation.  Apical scarring greater on the left, appears stable.  No pneumothorax or pulmonary edema.  No areas of worsening ventilation.  IMPRESSION: No acute cardiopulmonary abnormality.   Original Report Authenticated By: Erskine Speed, M.D.    EKG: 20-Mar-2013 17:22:40  Marked sinus bradycardia Left bundle branch block SInce previuous ECG rate is slower Vent. rate 47 BPM PR interval 190 ms QRS duration 154 ms QT/QTc 560/495 ms P-R-T axes 40 -26 112  FOLLOW UP PLANS AND APPOINTMENTS Allergies  Allergen Reactions  . Metoprolol Swelling    Takes bisoprolol without issue  . Norvasc (Amlodipine Besylate) Swelling    Takes nisoldipine without issue  . Sulfonamide Derivatives Hives, Itching and Swelling  . Amiodarone Nausea And Vomiting  . Statins     Myalgias - "I won't take them"     Medication List    STOP taking these medications       clopidogrel 75 MG tablet  Commonly  known as:  PLAVIX     felodipine 5 MG 24 hr tablet  Commonly known as:  PLENDIL      TAKE these medications       aspirin EC 81 MG tablet  Take 1 tablet (81 mg total) by mouth daily.     b complex vitamins tablet  Take 1 tablet by mouth daily.     bisoprolol 10 MG tablet  Commonly known as:  ZEBETA  Take 1 tablet (10 mg total) by mouth daily.     diltiazem 120 MG 24 hr capsule  Commonly known as:  CARDIZEM CD  Take 1 capsule (120 mg total) by mouth daily.     fish oil-omega-3 fatty acids 1000 MG capsule  Take 1 g by mouth 2 (two) times daily.     isosorbide mononitrate 30 MG 24 hr tablet  Commonly known as:  IMDUR  Take 30 mg by mouth daily.     Krill Oil 300 MG Caps  Take 1 capsule by mouth daily.     L-Lysine 500 MG Caps  Take 1 capsule by mouth 2 (two) times daily.     loratadine 10 MG tablet  Commonly known as:  CLARITIN  Take 10 mg by  mouth daily.     nitroGLYCERIN 0.4 MG SL tablet  Commonly known as:  NITROSTAT  Place 1 tablet (0.4 mg total) under the tongue every 5 (five) minutes x 3 doses as needed for chest pain.     Vitamin D-3 5000 UNITS Tabs  Take 1 tablet by mouth daily.     warfarin 5 MG tablet  Commonly known as:  COUMADIN  Take 1 tablet (5 mg total) by mouth daily. Or as directed.        Discharge Orders   Future Appointments Provider Department Dept Phone   03/25/2013 10:00 AM [facility] [place] [phone]   04/05/2013 3:30 PM Tonny Bollman, MD [facility] [place]) [phone]   04/24/2013 2:30 PM [name], MD [facility] [place] [phone]   Future Orders Complete By Expires     (HEART FAILURE PATIENTS) Call MD:  Anytime you have any of the following symptoms: 1) 3 pound weight gain in 24 hours or 5 pounds in 1 week 2) shortness of breath, with or without a dry hacking cough 3) swelling in the hands, feet or stomach 4) if you have to sleep on extra pillows at night in order to breathe.  As directed     Diet - low sodium heart healthy  As directed     Increase activity slowly  As directed       Follow-up Information   Follow up with [facility] On 03/25/2013. (Coumadin check at 10:00 am)    Contact information:   [address] [place] [address]       Follow up with Tonny Bollman, MD On 04/05/2013. (at 3:30 pm)    Contact information:   [address] [place] [address] [phone]       BRING ALL MEDICATIONS WITH YOU TO FOLLOW UP APPOINTMENTS  Time spent with patient to include physician time: 54 min Signed: Theodore Demark, PA-C 03/22/2013, 5:34 PM Co-Sign MD

## 2013-03-22 NOTE — Progress Notes (Signed)
ANTICOAGULATION CONSULT NOTE - Follow Up Consult  Pharmacy Consult for heparin/coumadin Indication: atrial fibrillation  Labs:  Recent Labs  03/20/13 0535 03/21/13 0435 03/21/13 1700 03/22/13 0445 03/22/13 1529  HGB 11.0* 11.2*  --  10.9*  --   HCT 31.9* 31.8*  --  31.7*  --   PLT 237 248  --  242  --   LABPROT 14.2 17.1*  --  21.7*  --   INR 1.11 1.43  --  1.98*  --   HEPARINUNFRC 0.52 0.50  --  0.13* 0.88*  CREATININE 2.48* 2.20* 2.29*  --   --     Assessment: 79yo female on heparin and coumadin for afib, plan to go home today. Heparin now d/c'd. INR large increase (slightly subtherapeutic) on 7.5mg  , will decrease to 5mg  today, estimate this to be home dose.  Goal of Therapy:  Heparin level 0.3-0.7 units/ml   Plan:  Coumadin 5mg  today, to follow up in clinic on Monday.  Verlene Mayer, PharmD, BCPS Pager [phone]  03/22/2013,4:31 PM

## 2013-03-22 NOTE — Progress Notes (Signed)
Utilization Review Completed.   Eldana Isip, RN, BSN Nurse Case Manager  [phone]  

## 2013-03-22 NOTE — Progress Notes (Signed)
ANTICOAGULATION CONSULT NOTE - Follow Up Consult  Pharmacy Consult for heparin Indication: atrial fibrillation  Labs:  Recent Labs  03/20/13 0535 03/21/13 0435 03/21/13 1700 03/22/13 0445  HGB 11.0* 11.2*  --  10.9*  HCT 31.9* 31.8*  --  31.7*  PLT 237 248  --  242  LABPROT 14.2 17.1*  --  21.7*  INR 1.11 1.43  --  1.98*  HEPARINUNFRC 0.52 0.50  --  0.13*  CREATININE 2.48* 2.20* 2.29*  --     Assessment: 79yo female now subtherapeutic on hpeairn after two levels at goal, INR just under 2; no issues with gtt per RN.  Goal of Therapy:  Heparin level 0.3-0.7 units/ml   Plan:  Will increase heparin gtt by ~2 units/kg/hr to 1100 units/hr and check level in 8hr.  Vernard Gambles, PharmD, BCPS  03/22/2013,6:37 AM

## 2013-03-22 NOTE — Progress Notes (Signed)
Subjective: Patient is feeling good  No CP  Breathing is good. Objective: Filed Vitals:   03/21/13 1029 03/21/13 1231 03/21/13 1355 03/22/13 0725  BP: 128/76  133/50 160/67  Pulse:   53 55  Temp:   97.9 F (36.6 C) 97.7 F (36.5 C)  TempSrc:   Oral Oral  Resp:   18 18  Height:      Weight:    131 lb 1.6 oz (59.467 kg)  SpO2:  98% 96% 96%   Weight change:   Intake/Output Summary (Last 24 hours) at 03/22/13 0842 Last data filed at 03/22/13 0837  Gross per 24 hour  Intake 947.03 ml  Output   2100 ml  Net -1152.97 ml    General: Alert, awake, oriented x3, in no acute distress Neck:  JVP is normal Heart: Regular rate and rhythm, without murmurs, rubs, gallops.  Lungs: Clear to auscultation.  No rales or wheezes. Exemities:  No edema.   Neuro: Grossly intact, nonfocal.  Tele:  SR  Lab Results: Results for orders placed during the hospital encounter of 03/15/13 (from the past 24 hour(s))  PRO B NATRIURETIC PEPTIDE     Status: Abnormal   Collection Time    03/21/13  4:45 PM      Result Value Range   Pro B Natriuretic peptide (BNP) 2058.0 (*) 0 - 450 pg/mL  BASIC METABOLIC PANEL     Status: Abnormal   Collection Time    03/21/13  5:00 PM      Result Value Range   Sodium 134 (*) 135 - 145 mEq/L   Potassium 4.4  3.5 - 5.1 mEq/L   Chloride 97  96 - 112 mEq/L   CO2 27  19 - 32 mEq/L   Glucose, Bld 98  70 - 99 mg/dL   BUN 37 (*) 6 - 23 mg/dL   Creatinine, Ser 1.61 (*) 0.50 - 1.10 mg/dL   Calcium 9.4  8.4 - 09.6 mg/dL   GFR calc non Af Amer 19 (*) >90 mL/min   GFR calc Af Amer 22 (*) >90 mL/min  HEPARIN LEVEL (UNFRACTIONATED)     Status: Abnormal   Collection Time    03/22/13  4:45 AM      Result Value Range   Heparin Unfractionated 0.13 (*) 0.30 - 0.70 IU/mL  CBC     Status: Abnormal   Collection Time    03/22/13  4:45 AM      Result Value Range   WBC 7.7  4.0 - 10.5 K/uL   RBC 3.61 (*) 3.87 - 5.11 MIL/uL   Hemoglobin 10.9 (*) 12.0 - 15.0 g/dL   HCT 04.5 (*) 40.9  - 46.0 %   MCV 87.8  78.0 - 100.0 fL   MCH 30.2  26.0 - 34.0 pg   MCHC 34.4  30.0 - 36.0 g/dL   RDW 81.1  91.4 - 78.2 %   Platelets 242  150 - 400 K/uL  PROTIME-INR     Status: Abnormal   Collection Time    03/22/13  4:45 AM      Result Value Range   Prothrombin Time 21.7 (*) 11.6 - 15.2 seconds   INR 1.98 (*) 0.00 - 1.49    Studies/Results: @RISRSLT24 @  Medications: Reviewed.  Impression:  1. Atrial fibrillation  Remains in SR  Would continue coumadin  Will d/c heparin.  F/U with coumadin check in [place] clnic on Monday  2.  CAD  Remains asymptomatic.  Continue meds  S/p PTCA/Xience  stents in July  D/c'd plavix since she is on coumadin.  Trop bump felt to be demand ischemi  3. CRI Stable.  She is comfortable at currnet vol  I would not push to get BNP down  May cause more problems.  D/C today  INR on MONday.  F/U in 2 to 3 wks in clinic [facility] st)     LOS: 7 days   Dietrich Pates 03/22/2013, 8:42 AM

## 2013-03-22 NOTE — Progress Notes (Signed)
DC IV, DC Tele, DC Home. Discharge instructions and home medications discussed with patient and patient's family member. Patient and family denied any questions or concerns at this time. Patient leaving unit via wheelchair and appears in no acute distress. 

## 2013-03-25 ENCOUNTER — Ambulatory Visit (INDEPENDENT_AMBULATORY_CARE_PROVIDER_SITE_OTHER): Payer: Medicare Other | Admitting: *Deleted

## 2013-03-25 DIAGNOSIS — Z7901 Long term (current) use of anticoagulants: Secondary | ICD-10-CM

## 2013-03-25 DIAGNOSIS — I4891 Unspecified atrial fibrillation: Secondary | ICD-10-CM

## 2013-03-25 LAB — POCT INR: INR: 3.9

## 2013-04-01 ENCOUNTER — Ambulatory Visit (INDEPENDENT_AMBULATORY_CARE_PROVIDER_SITE_OTHER): Payer: Medicare Other | Admitting: *Deleted

## 2013-04-01 DIAGNOSIS — Z7901 Long term (current) use of anticoagulants: Secondary | ICD-10-CM

## 2013-04-01 DIAGNOSIS — I4891 Unspecified atrial fibrillation: Secondary | ICD-10-CM

## 2013-04-01 LAB — POCT INR: INR: 3.1

## 2013-04-03 ENCOUNTER — Encounter: Payer: Self-pay | Admitting: Family Medicine

## 2013-04-03 ENCOUNTER — Telehealth: Payer: Self-pay | Admitting: Family Medicine

## 2013-04-03 ENCOUNTER — Ambulatory Visit (INDEPENDENT_AMBULATORY_CARE_PROVIDER_SITE_OTHER): Payer: Medicare Other | Admitting: Family Medicine

## 2013-04-03 ENCOUNTER — Telehealth: Payer: Self-pay | Admitting: *Deleted

## 2013-04-03 VITALS — BP 160/60 | Temp 97.7°F | Wt 136.0 lb

## 2013-04-03 DIAGNOSIS — L509 Urticaria, unspecified: Secondary | ICD-10-CM

## 2013-04-03 NOTE — Patient Instructions (Addendum)
Hives Hives are itchy, red, swollen areas of the skin. They can vary in size and location on your body. Hives can come and go for hours or several days (acute hives) or for several weeks (chronic hives). Hives do not spread from person to person (noncontagious). They may get worse with scratching, exercise, and emotional stress. CAUSES   Allergic reaction to food, additives, or drugs.  Infections, including the common cold.  Illness, such as vasculitis, lupus, or thyroid disease.  Exposure to sunlight, heat, or cold.  Exercise.  Stress.  Contact with chemicals. SYMPTOMS   Red or white swollen patches on the skin. The patches may change size, shape, and location quickly and repeatedly.  Itching.  Swelling of the hands, feet, and face. This may occur if hives develop deeper in the skin. DIAGNOSIS  Your caregiver can usually tell what is wrong by performing a physical exam. Skin or blood tests may also be done to determine the cause of your hives. In some cases, the cause cannot be determined. TREATMENT  Mild cases usually get better with medicines such as antihistamines. Severe cases may require an emergency epinephrine injection. If the cause of your hives is known, treatment includes avoiding that trigger.  HOME CARE INSTRUCTIONS   Avoid causes that trigger your hives.  Take antihistamines as directed by your caregiver to reduce the severity of your hives. Non-sedating or low-sedating antihistamines are usually recommended. Do not drive while taking an antihistamine.  Take any other medicines prescribed for itching as directed by your caregiver.  Wear loose-fitting clothing.  Keep all follow-up appointments as directed by your caregiver. SEEK MEDICAL CARE IF:   You have persistent or severe itching that is not relieved with medicine.  You have painful or swollen joints. SEEK IMMEDIATE MEDICAL CARE IF:   You have a fever.  Your tongue or lips are swollen.  You have  trouble breathing or swallowing.  You feel tightness in the throat or chest.  You have abdominal pain. These problems may be the first sign of a life-threatening allergic reaction. Call your local emergency services (911 in [place] MAKE SURE YOU:   Understand these instructions.  Will watch your condition.  Will get help right away if you are not doing well or get worse. Document Released: 11/14/2005 Document Revised: 05/15/2012 Document Reviewed: 02/07/2012 [facility] Patient Information 2013 [place], [place].  Try over the counter claritan or Allegra once daily for hives May add Pepcid or Zantac which can help alleviate hives Be in touch by next week if these are not resolving.

## 2013-04-03 NOTE — Telephone Encounter (Signed)
Patient thinks she is breaking out in hives, she thinks it is due to coumadin

## 2013-04-03 NOTE — Progress Notes (Signed)
Subjective:    Patient ID: [name], female    DOB: Feb 16, 1930, 79 y.o.   MRN: [id number]  HPI Hives onset 2 days ago.   Diffuse, mostly trunk.  Moderate pruritis Recent initiation of Diltiazem and coumadin-few weeks ago. No recent antibiotics. No new soaps or detergents.   No antihistamines.  Worse with heat. No recent fever.   Prior hx of hives with past evaluation with dermatology with no etiology found.  Past Medical History  Diagnosis Date  . Lower extremity edema     bilateral  . CKD (chronic kidney disease), stage III     a. baseline creat 1.6->2  . Labile hypertension   . Anxiety   . Failure to thrive in childhood   . History of shingles July 2004    on the left side of her thorax and postherpetic neuralgia  . History of psoriasis   . History of recurrent UTIs   . Major depressive disorder, single episode, severe May 02, 2003    "when my husband died"  . Valvular heart disease     a. 07/2012 Echo in setting of afib: EF 35%, Mild AI/MR, Mod Dil LA, Mod TR,  . CAD (coronary artery disease)     a. NSTEMI 05/2012 s/p DES to OM 06/07/12 (residual severe LCx subbranch dz for med rx unless refractory angina recurs)  . LBBB (left bundle branch block)     intermittent  . Chronic combined systolic and diastolic CHF (congestive heart failure)     a. 05/2012 Echo: EF 60-65%, Gr 1 DD, mild AI/MR, Mod TR;  b. 07/2012 Echo in setting of afib: EF 35%, Mild AI/MR, Mod Dil LA, Mod TR, PASP .  . Atrial fibrillation     a. Dx 07/2012 -> Amio started.  No coumadin 2/2 ongoing DAPT;  b. 08/15/2012 Amio d/c 2/2 nausea - maintaining sinus rhythm.   Past Surgical History  Procedure Laterality Date  . Salpingoophorectomy      "cyst removed"  . Appendectomy    . Breast cyst excision    . Abdominal hysterectomy    . Cataract extraction, bilateral  ~ May 01, 2009  . Cardiac surgery      reports that she quit smoking about 33 years ago. Her smoking use included Cigarettes. She has a 45 pack-year  smoking history. She has never used smokeless tobacco. She reports that  drinks alcohol. She reports that she does not use illicit drugs. family history includes Cancer in an unspecified family member; Diabetes in an unspecified family member; and Lung cancer in an unspecified family member. Allergies  Allergen Reactions  . Metoprolol Swelling    Takes bisoprolol without issue  . Norvasc (Amlodipine Besylate) Swelling    Takes nisoldipine without issue  . Sulfonamide Derivatives Hives, Itching and Swelling  . Amiodarone Nausea And Vomiting  . Statins     Myalgias - "I won't take them"      Review of Systems  Constitutional: Negative for fever, chills and unexpected weight change.  HENT: Negative for congestion and sore throat.   Respiratory: Negative for cough.   Gastrointestinal: Negative for abdominal pain.  Genitourinary: Negative for dysuria.  Skin: Positive for rash.       Objective:   Physical Exam  Constitutional: She appears well-developed and well-nourished.  Neck: Neck supple.  Cardiovascular: Normal rate and regular rhythm.   Pulmonary/Chest: Effort normal and breath sounds normal. No respiratory distress. She has no wheezes. She has no rales.  Lymphadenopathy:    She  has no cervical adenopathy.  Skin: Rash noted.  Patient has diffuse rash mostly trunk. Slightly raised and erythematous and non-scaly. Nontender. Blanches with pressure          Assessment & Plan:  Urticaria. Question etiology. Long history of idiopathic urticaria. Recent initiation of Cardizem and Coumadin but not clearly related. Try over-the-counter antihistamine such as Claritin or Allegra. Could add Pepcid or Zantac. Touch base one week if no improvement. If not resolving soon will consult cardiologist  Recent atrial fibrillation with rapid ventricular response. Clinically, normal sinus rhythm today

## 2013-04-03 NOTE — Telephone Encounter (Signed)
Pt states rash is all over body.  Denies using new washing powder,soaps ,lotions, etc.  She has made an appt to see Dr Caryl Never this afternoon.  Will discuss treatment plan after she sees MD today.

## 2013-04-03 NOTE — Telephone Encounter (Signed)
Patient Information:  Caller Name: Sairah  Phone: [phone]  Patient: [name], [name]  Gender: Female  DOB: [birth year]  Age: 79 Years  PCP: Evelena Peat (Family Practice)  Office Follow Up:  Does the office need to follow up with this patient?: No  Instructions For The Office: N/A  RN Note:  pt is concerned that she is having a reaction to the coumadin.  Symptoms  Reason For Call & Symptoms: hives all over; no new food  Reviewed Health History In EMR: Yes  Reviewed Medications In EMR: Yes  Reviewed Allergies In EMR: Yes  Reviewed Surgeries / Procedures: Yes  Date of Onset of Symptoms: 04/02/2013  Guideline(s) Used:  Hives  Disposition Per Guideline:   See Today in Office  Reason For Disposition Reached:   Patient wants to be seen  Advice Given:  N/A  Patient Will Follow Care Advice:  YES  Appointment Scheduled:  04/03/2013 13:45:00 Appointment Scheduled Provider:  Evelena Peat (Family Practice)  (pt could not make earlier appt because of drive time)

## 2013-04-05 ENCOUNTER — Ambulatory Visit (INDEPENDENT_AMBULATORY_CARE_PROVIDER_SITE_OTHER): Payer: Medicare Other | Admitting: Cardiovascular Disease

## 2013-04-05 ENCOUNTER — Encounter: Payer: Self-pay | Admitting: Cardiovascular Disease

## 2013-04-05 VITALS — BP 160/60 | HR 63 | Ht 61.0 in | Wt 136.0 lb

## 2013-04-05 DIAGNOSIS — I2510 Atherosclerotic heart disease of native coronary artery without angina pectoris: Secondary | ICD-10-CM

## 2013-04-05 DIAGNOSIS — I4891 Unspecified atrial fibrillation: Secondary | ICD-10-CM

## 2013-04-05 DIAGNOSIS — Z7901 Long term (current) use of anticoagulants: Secondary | ICD-10-CM

## 2013-04-05 DIAGNOSIS — I509 Heart failure, unspecified: Secondary | ICD-10-CM

## 2013-04-05 DIAGNOSIS — L509 Urticaria, unspecified: Secondary | ICD-10-CM

## 2013-04-05 DIAGNOSIS — I5043 Acute on chronic combined systolic (congestive) and diastolic (congestive) heart failure: Secondary | ICD-10-CM

## 2013-04-05 MED ORDER — METHYLPREDNISOLONE 4 MG PO KIT: PACK | ORAL | Status: DC

## 2013-04-05 MED ORDER — FAMOTIDINE 10 MG PO TABS: 10.0000 mg | ORAL_TABLET | Freq: Two times a day (BID) | ORAL | Status: DC

## 2013-04-05 NOTE — Progress Notes (Signed)
HPI:  79 year old woman presenting for followup evaluation. She has coronary artery disease and underwent PCI of the left circumflex and July 2013 after presenting with a non-ST elevation infarction. A drug-eluting stent platform was utilized. She subsequently had diastolic heart failure in the setting of atrial fib with RVR. She was hospitalized on 2 other occasions.   She was just seen in April for followup evaluation and was doing well at that time. Unfortunately, just a few days later she developed atrial fib with RVR. She was hospitalized and was quite sick with acute congestive heart failure. She was started on warfarin for anticoagulation. She converted back to sinus rhythm on a combination of beta blocker and calcium channel blocker.  Since discharge, she has developed a diffuse rash and severe skin itching. This involves her scalp, back, arms, and legs. She's had mild chest discomfort on and off but this does not feel similar to her previous cardiac pain. Her breathing is improved. She denies leg swelling, wheezing, or cough.  Outpatient Encounter Prescriptions as of 04/05/2013  Medication Sig Dispense Refill  . aspirin EC 81 MG tablet Take 1 tablet (81 mg total) by mouth daily.      Marland Kitchen b complex vitamins tablet Take 1 tablet by mouth daily.        . bisoprolol (ZEBETA) 10 MG tablet Take 10 mg by mouth 2 (two) times daily.      . Cholecalciferol (VITAMIN D-3) 5000 UNITS TABS Take 1 tablet by mouth daily.      Marland Kitchen diltiazem (CARDIZEM CD) 120 MG 24 hr capsule Take 1 capsule (120 mg total) by mouth daily.  30 capsule  11  . fish oil-omega-3 fatty acids 1000 MG capsule Take 1 g by mouth 2 (two) times daily.       . isosorbide mononitrate (IMDUR) 30 MG 24 hr tablet Take 30 mg by mouth daily.      Marland Kitchen L-Lysine 500 MG CAPS Take 1 capsule by mouth 2 (two) times daily.      Marland Kitchen loratadine (CLARITIN) 10 MG tablet Take 10 mg by mouth daily.      . nitroGLYCERIN (NITROSTAT) 0.4 MG SL tablet Place 1 tablet  (0.4 mg total) under the tongue every 5 (five) minutes x 3 doses as needed for chest pain.  25 tablet  4  . warfarin (COUMADIN) 5 MG tablet Take 1 tablet (5 mg total) by mouth daily. Or as directed.  60 tablet  11  . [DISCONTINUED] Krill Oil 300 MG CAPS Take 1 capsule by mouth daily.        No facility-administered encounter medications on file as of 04/05/2013.    Allergies  Allergen Reactions  . Metoprolol Swelling    Takes bisoprolol without issue  . Norvasc (Amlodipine Besylate) Swelling    Takes nisoldipine without issue  . Sulfonamide Derivatives Hives, Itching and Swelling  . Amiodarone Nausea And Vomiting  . Statins     Myalgias - "I won't take them"    Past Medical History  Diagnosis Date  . Lower extremity edema     bilateral  . CKD (chronic kidney disease), stage III     a. baseline creat 1.6->2  . Labile hypertension   . Anxiety   . Failure to thrive in childhood   . History of shingles July 2004    on the left side of her thorax and postherpetic neuralgia  . History of psoriasis   . History of recurrent UTIs   . Major  depressive disorder, single episode, severe May 02, 2003    "when my husband died"  . Valvular heart disease     a. 07/2012 Echo in setting of afib: EF 35%, Mild AI/MR, Mod Dil LA, Mod TR,  . CAD (coronary artery disease)     a. NSTEMI 05/2012 s/p DES to OM 06/07/12 (residual severe LCx subbranch dz for med rx unless refractory angina recurs)  . LBBB (left bundle branch block)     intermittent  . Chronic combined systolic and diastolic CHF (congestive heart failure)     a. 05/2012 Echo: EF 60-65%, Gr 1 DD, mild AI/MR, Mod TR;  b. 07/2012 Echo in setting of afib: EF 35%, Mild AI/MR, Mod Dil LA, Mod TR, PASP .  . Atrial fibrillation     a. Dx 07/2012 -> Amio started.  No coumadin 2/2 ongoing DAPT;  b. 08/15/2012 Amio d/c 2/2 nausea - maintaining sinus rhythm.    ROS: Negative except as per HPI  BP 160/60  Pulse 63  Ht 5\' 1"  (1.549 m)  Wt 61.689 kg (136  lb)  BMI 25.71 kg/m2  SpO2 96%  PHYSICAL EXAM: Pt is alert and oriented, pleasant elderly woman in NAD HEENT: normal Neck: JVP - normal, carotids 2+= with bilateral bruits Lungs: CTA bilaterally CV: RRR with grade 2/6 harsh systolic murmur at the right upper sternal border Abd: soft, NT, Positive BS, no hepatomegaly Ext: no C/C/E, distal pulses intact and equal Skin: Diffuse urticarial rash noted  EKG:  Normal sinus rhythm with left bundle branch block, heart rate 63 beats per minute.  ASSESSMENT AND PLAN: 1. Paroxysmal atrial fibrillation. She becomes highly symptomatic with atrial fib and this is been complicated by acute heart failure when it has occurred. She is clearly having a drug reaction from either warfarin or Cardizem. I think warfarin is the more important drug for her, so we will stop her Cardizem first. She was written a prescription for a Medrol Dosepak, she'll also start on Pepcid. She'll call back late next week to let us know how her skin rash is doing. If she is having no improvement, we'll have to stop Coumadin.  2. Coronary artery disease, native vessel. She is having some chest pain, but different from previous cardiac pain. Will manage conservatively. I'll see her back in 4 weeks for followup.  3. Mixed aortic valvular disease. mild to moderate aortic stenosis and insufficiency, so will manage medically.  4. Hypertension. Continue current management.  Tonny Bollman 04/05/2013 4:24 PM

## 2013-04-05 NOTE — Patient Instructions (Addendum)
Your physician has recommended you make the following change in your medication: HOLD Cardizem, START Medrol dose pack, START Pepcid 10mg  take one by mouth twice a day (over the counter)  Your physician recommends that you schedule a follow-up appointment in: 4 WEEKS with Dr Excell Seltzer  Please call the office next week to let us know how you are feeling.

## 2013-04-08 ENCOUNTER — Inpatient Hospital Stay (HOSPITAL_COMMUNITY)
Admission: EM | Admit: 2013-04-08 | Discharge: 2013-04-11 | DRG: 280 | Disposition: A | Discharge status: Home / Self Care | Payer: Medicare Other | Attending: Cardiovascular Disease | Admitting: Cardiovascular Disease

## 2013-04-08 ENCOUNTER — Encounter (HOSPITAL_COMMUNITY): Payer: Self-pay | Admitting: *Deleted

## 2013-04-08 ENCOUNTER — Emergency Department (HOSPITAL_COMMUNITY): Payer: Medicare Other

## 2013-04-08 DIAGNOSIS — I2510 Atherosclerotic heart disease of native coronary artery without angina pectoris: Secondary | ICD-10-CM | POA: Diagnosis present

## 2013-04-08 DIAGNOSIS — I509 Heart failure, unspecified: Secondary | ICD-10-CM

## 2013-04-08 DIAGNOSIS — J9601 Acute respiratory failure with hypoxia: Secondary | ICD-10-CM | POA: Diagnosis present

## 2013-04-08 DIAGNOSIS — Z7982 Long term (current) use of aspirin: Secondary | ICD-10-CM

## 2013-04-08 DIAGNOSIS — N184 Chronic kidney disease, stage 4 (severe): Secondary | ICD-10-CM | POA: Diagnosis present

## 2013-04-08 DIAGNOSIS — J9690 Respiratory failure, unspecified, unspecified whether with hypoxia or hypercapnia: Secondary | ICD-10-CM

## 2013-04-08 DIAGNOSIS — Z79899 Other long term (current) drug therapy: Secondary | ICD-10-CM

## 2013-04-08 DIAGNOSIS — I5033 Acute on chronic diastolic (congestive) heart failure: Secondary | ICD-10-CM

## 2013-04-08 DIAGNOSIS — I161 Hypertensive emergency: Secondary | ICD-10-CM

## 2013-04-08 DIAGNOSIS — I5043 Acute on chronic combined systolic (congestive) and diastolic (congestive) heart failure: Secondary | ICD-10-CM

## 2013-04-08 DIAGNOSIS — Z87891 Personal history of nicotine dependence: Secondary | ICD-10-CM

## 2013-04-08 DIAGNOSIS — I214 Non-ST elevation (NSTEMI) myocardial infarction: Principal | ICD-10-CM

## 2013-04-08 DIAGNOSIS — I359 Nonrheumatic aortic valve disorder, unspecified: Secondary | ICD-10-CM

## 2013-04-08 DIAGNOSIS — J810 Acute pulmonary edema: Secondary | ICD-10-CM

## 2013-04-08 DIAGNOSIS — N189 Chronic kidney disease, unspecified: Secondary | ICD-10-CM

## 2013-04-08 DIAGNOSIS — I248 Other forms of acute ischemic heart disease: Secondary | ICD-10-CM | POA: Diagnosis present

## 2013-04-08 DIAGNOSIS — I129 Hypertensive chronic kidney disease with stage 1 through stage 4 chronic kidney disease, or unspecified chronic kidney disease: Secondary | ICD-10-CM | POA: Diagnosis present

## 2013-04-08 DIAGNOSIS — I2489 Other forms of acute ischemic heart disease: Secondary | ICD-10-CM | POA: Diagnosis present

## 2013-04-08 DIAGNOSIS — I447 Left bundle-branch block, unspecified: Secondary | ICD-10-CM | POA: Diagnosis present

## 2013-04-08 DIAGNOSIS — Z7901 Long term (current) use of anticoagulants: Secondary | ICD-10-CM

## 2013-04-08 DIAGNOSIS — F411 Generalized anxiety disorder: Secondary | ICD-10-CM | POA: Diagnosis present

## 2013-04-08 DIAGNOSIS — I4891 Unspecified atrial fibrillation: Secondary | ICD-10-CM

## 2013-04-08 DIAGNOSIS — J9600 Acute respiratory failure, unspecified whether with hypoxia or hypercapnia: Secondary | ICD-10-CM

## 2013-04-08 DIAGNOSIS — I252 Old myocardial infarction: Secondary | ICD-10-CM

## 2013-04-08 HISTORY — DX: Chronic kidney disease, stage 4 (severe): N18.4

## 2013-04-08 HISTORY — DX: Paroxysmal atrial fibrillation: I48.0

## 2013-04-08 HISTORY — DX: Respiratory failure, unspecified, unspecified whether with hypoxia or hypercapnia: J96.90

## 2013-04-08 LAB — CBC WITH DIFFERENTIAL/PLATELET
Basophils Absolute: 0.1 10*3/uL (ref 0.0–0.1)
Eosinophils Absolute: 1.4 10*3/uL — ABNORMAL HIGH (ref 0.0–0.7)
Eosinophils Relative: 6 % — ABNORMAL HIGH (ref 0–5)
MCH: 30.2 pg (ref 26.0–34.0)
MCHC: 34.1 g/dL (ref 30.0–36.0)
MCV: 88.7 fL (ref 78.0–100.0)
Platelets: 323 10*3/uL (ref 150–400)
RDW: 13.3 % (ref 11.5–15.5)

## 2013-04-08 LAB — COMPREHENSIVE METABOLIC PANEL
ALT: 21 U/L (ref 0–35)
AST: 38 U/L — ABNORMAL HIGH (ref 0–37)
CO2: 23 mEq/L (ref 19–32)
Calcium: 8.7 mg/dL (ref 8.4–10.5)
Calcium: 8.8 mg/dL (ref 8.4–10.5)
Creatinine, Ser: 2.15 mg/dL — ABNORMAL HIGH (ref 0.50–1.10)
GFR calc Af Amer: 25 mL/min — ABNORMAL LOW (ref >90)
GFR calc non Af Amer: 20 mL/min — ABNORMAL LOW (ref >90)
Glucose, Bld: 178 mg/dL — ABNORMAL HIGH (ref 70–99)
Sodium: 132 mEq/L — ABNORMAL LOW (ref 135–145)
Total Protein: 7.1 g/dL (ref 6.0–8.3)
Total Protein: 6.5 g/dL (ref 6.0–8.3)

## 2013-04-08 LAB — INFLUENZA PANEL BY PCR (TYPE A & B)
H1N1 flu by pcr: NOT DETECTED
Influenza A By PCR: NEGATIVE
Influenza B By PCR: NEGATIVE

## 2013-04-08 LAB — URINE MICROSCOPIC-ADD ON

## 2013-04-08 LAB — POCT I-STAT 3, ART BLOOD GAS (G3+)
Acid-base deficit: 7 mmol/L — ABNORMAL HIGH (ref 0.0–2.0)
O2 Saturation: 97 %
O2 Saturation: 99 %
Patient temperature: 97.3
TCO2: 25 mmol/L (ref 0–100)
pCO2 arterial: 65.6 mmHg (ref 35.0–45.0)
pCO2 arterial: 56.2 mmHg — ABNORMAL HIGH (ref 35.0–45.0)
pH, Arterial: 7.229 — ABNORMAL LOW (ref 7.350–7.450)

## 2013-04-08 LAB — TROPONIN I: Troponin I: 2.41 ng/mL (ref <0.30)

## 2013-04-08 LAB — PHOSPHORUS: Phosphorus: 3.6 mg/dL (ref 2.3–4.6)

## 2013-04-08 LAB — CBC
HCT: 32.2 % — ABNORMAL LOW (ref 36.0–46.0)
MCHC: 33.9 g/dL (ref 30.0–36.0)
MCV: 88 fL (ref 78.0–100.0)
RDW: 13.5 % (ref 11.5–15.5)

## 2013-04-08 LAB — PROTIME-INR: INR: 2.43 — ABNORMAL HIGH (ref 0.00–1.49)

## 2013-04-08 LAB — APTT: aPTT: 34 seconds (ref 24–37)

## 2013-04-08 LAB — LACTIC ACID, PLASMA: Lactic Acid, Venous: 1.9 mmol/L (ref 0.5–2.2)

## 2013-04-08 LAB — STREP PNEUMONIAE URINARY ANTIGEN: Strep Pneumo Urinary Antigen: NEGATIVE

## 2013-04-08 LAB — URINALYSIS, ROUTINE W REFLEX MICROSCOPIC
Bilirubin Urine: NEGATIVE
Ketones, ur: NEGATIVE mg/dL
Nitrite: NEGATIVE
Specific Gravity, Urine: 1.009 (ref 1.005–1.030)
Urobilinogen, UA: 0.2 mg/dL (ref 0.0–1.0)

## 2013-04-08 LAB — POCT I-STAT, CHEM 8
Calcium, Ion: 1.16 mmol/L (ref 1.13–1.30)
Creatinine, Ser: 1.8 mg/dL — ABNORMAL HIGH (ref 0.50–1.10)
Glucose, Bld: 178 mg/dL — ABNORMAL HIGH (ref 70–99)
Hemoglobin: 11.6 g/dL — ABNORMAL LOW (ref 12.0–15.0)
TCO2: 24 mmol/L (ref 0–100)

## 2013-04-08 MED ORDER — FUROSEMIDE 10 MG/ML IJ SOLN: 40.0000 mg | Freq: Two times a day (BID) | INTRAMUSCULAR | Status: DC

## 2013-04-08 MED ORDER — LIDOCAINE HCL (CARDIAC) 20 MG/ML IV SOLN
INTRAVENOUS | Status: AC
  Filled 2013-04-08: qty 5

## 2013-04-08 MED ORDER — CHLORHEXIDINE GLUCONATE 0.12 % MT SOLN
15.0000 mL | Freq: Two times a day (BID) | OROMUCOSAL | Status: DC
  Administered 2013-04-08 – 2013-04-09 (×2): 15 mL via OROMUCOSAL
  Filled 2013-04-08 (×2): qty 15

## 2013-04-08 MED ORDER — ASPIRIN 81 MG PO CHEW
81.0000 mg | CHEWABLE_TABLET | Freq: Every day | ORAL | Status: DC
  Administered 2013-04-08 – 2013-04-11 (×4): 81 mg via NASOGASTRIC
  Filled 2013-04-08 (×5): qty 1

## 2013-04-08 MED ORDER — FUROSEMIDE 10 MG/ML IJ SOLN
40.0000 mg | Freq: Two times a day (BID) | INTRAMUSCULAR | Status: DC
  Administered 2013-04-08 – 2013-04-09 (×3): 40 mg via INTRAVENOUS
  Filled 2013-04-08 (×5): qty 4

## 2013-04-08 MED ORDER — FUROSEMIDE 10 MG/ML IJ SOLN
80.0000 mg | Freq: Once | INTRAMUSCULAR | Status: AC
  Administered 2013-04-08: 80 mg via INTRAVENOUS
  Filled 2013-04-08: qty 8

## 2013-04-08 MED ORDER — VANCOMYCIN HCL IN DEXTROSE 1-5 GM/200ML-% IV SOLN
1000.0000 mg | INTRAVENOUS | Status: DC
  Administered 2013-04-08: 1000 mg via INTRAVENOUS
  Filled 2013-04-08: qty 200

## 2013-04-08 MED ORDER — VANCOMYCIN HCL 10 G IV SOLR
1500.0000 mg | INTRAVENOUS | Status: DC
  Filled 2013-04-08: qty 1500

## 2013-04-08 MED ORDER — SUCCINYLCHOLINE CHLORIDE 20 MG/ML IJ SOLN
INTRAMUSCULAR | Status: AC
  Administered 2013-04-08: 100 mg via INTRAVENOUS
  Filled 2013-04-08: qty 1

## 2013-04-08 MED ORDER — BIOTENE DRY MOUTH MT LIQD
15.0000 mL | Freq: Four times a day (QID) | OROMUCOSAL | Status: DC
  Administered 2013-04-08 – 2013-04-09 (×4): 15 mL via OROMUCOSAL

## 2013-04-08 MED ORDER — WARFARIN - PHYSICIAN DOSING INPATIENT: Freq: Every day | Status: DC

## 2013-04-08 MED ORDER — NITROGLYCERIN IN D5W 200-5 MCG/ML-% IV SOLN
2.0000 ug/min | Freq: Once | INTRAVENOUS | Status: AC
  Administered 2013-04-08: 10 ug/min via INTRAVENOUS
  Filled 2013-04-08: qty 250

## 2013-04-08 MED ORDER — MIDAZOLAM HCL 2 MG/2ML IJ SOLN
INTRAMUSCULAR | Status: AC
  Administered 2013-04-08: 4 mg via INTRAVENOUS
  Filled 2013-04-08: qty 4

## 2013-04-08 MED ORDER — PIPERACILLIN-TAZOBACTAM IN DEX 2-0.25 GM/50ML IV SOLN
2.2500 g | Freq: Four times a day (QID) | INTRAVENOUS | Status: DC
  Administered 2013-04-08 – 2013-04-09 (×5): 2.25 g via INTRAVENOUS
  Filled 2013-04-08 (×7): qty 50

## 2013-04-08 MED ORDER — SODIUM CHLORIDE 0.9 % IV SOLN
1.0000 mg/h | INTRAVENOUS | Status: DC
  Administered 2013-04-08 – 2013-04-09 (×2): 1 mg/h via INTRAVENOUS
  Filled 2013-04-08 (×2): qty 10

## 2013-04-08 MED ORDER — MIDAZOLAM BOLUS VIA INFUSION
1.0000 mg | INTRAVENOUS | Status: DC | PRN
  Administered 2013-04-08: 1 mg via INTRAVENOUS
  Administered 2013-04-08: 2 mg via INTRAVENOUS
  Filled 2013-04-08: qty 2

## 2013-04-08 MED ORDER — SODIUM CHLORIDE 0.9 % IV SOLN
250.0000 mL | INTRAVENOUS | Status: DC | PRN
  Administered 2013-04-08: 20 mL via INTRAVENOUS

## 2013-04-08 MED ORDER — WARFARIN SODIUM 2.5 MG PO TABS: 2.5000 mg | ORAL_TABLET | Freq: Every day | ORAL | Status: DC

## 2013-04-08 MED ORDER — MIDAZOLAM HCL 2 MG/2ML IJ SOLN
INTRAMUSCULAR | Status: AC
  Administered 2013-04-08: 2 mg via INTRAVENOUS
  Filled 2013-04-08: qty 2

## 2013-04-08 MED ORDER — CHLORHEXIDINE GLUCONATE 0.12 % MT SOLN
OROMUCOSAL | Status: AC
  Administered 2013-04-08: 15 mL via OROMUCOSAL
  Filled 2013-04-08: qty 15

## 2013-04-08 MED ORDER — WARFARIN - PHARMACIST DOSING INPATIENT: Freq: Every day | Status: DC

## 2013-04-08 MED ORDER — WARFARIN SODIUM 5 MG PO TABS: 5.0000 mg | ORAL_TABLET | ORAL | Status: DC

## 2013-04-08 MED ORDER — ROCURONIUM BROMIDE 50 MG/5ML IV SOLN
INTRAVENOUS | Status: AC
  Filled 2013-04-08: qty 2

## 2013-04-08 MED ORDER — PANTOPRAZOLE SODIUM 40 MG IV SOLR
40.0000 mg | INTRAVENOUS | Status: DC
  Administered 2013-04-08 – 2013-04-10 (×3): 40 mg via INTRAVENOUS
  Filled 2013-04-08 (×5): qty 40

## 2013-04-08 MED ORDER — ALBUTEROL SULFATE HFA 108 (90 BASE) MCG/ACT IN AERS: 4.0000 | INHALATION_SPRAY | RESPIRATORY_TRACT | Status: DC | PRN

## 2013-04-08 MED ORDER — WARFARIN SODIUM 2.5 MG PO TABS
2.5000 mg | ORAL_TABLET | ORAL | Status: DC
  Filled 2013-04-08: qty 1

## 2013-04-08 MED ORDER — SODIUM CHLORIDE 0.9 % IV SOLN
25.0000 ug/h | INTRAVENOUS | Status: DC
  Administered 2013-04-08: 50 ug/h via INTRAVENOUS
  Filled 2013-04-08: qty 50

## 2013-04-08 MED ORDER — ALBUTEROL SULFATE HFA 108 (90 BASE) MCG/ACT IN AERS
6.0000 | INHALATION_SPRAY | Freq: Four times a day (QID) | RESPIRATORY_TRACT | Status: DC
  Administered 2013-04-08 – 2013-04-10 (×9): 6 via RESPIRATORY_TRACT
  Filled 2013-04-08: qty 6.7

## 2013-04-08 MED ORDER — HEPARIN SODIUM (PORCINE) 5000 UNIT/ML IJ SOLN
5000.0000 [IU] | Freq: Three times a day (TID) | INTRAMUSCULAR | Status: DC
  Administered 2013-04-08 – 2013-04-11 (×9): 5000 [IU] via SUBCUTANEOUS
  Filled 2013-04-08 (×12): qty 1

## 2013-04-08 MED ORDER — FENTANYL BOLUS VIA INFUSION
25.0000 ug | Freq: Four times a day (QID) | INTRAVENOUS | Status: DC | PRN
  Administered 2013-04-08 (×2): 50 ug via INTRAVENOUS
  Filled 2013-04-08: qty 100

## 2013-04-08 MED ORDER — FENTANYL CITRATE 0.05 MG/ML IJ SOLN
INTRAMUSCULAR | Status: AC
  Administered 2013-04-08: 50 ug via INTRAVENOUS
  Filled 2013-04-08: qty 2

## 2013-04-08 MED ORDER — WARFARIN SODIUM 2.5 MG PO TABS
2.5000 mg | ORAL_TABLET | Freq: Once | ORAL | Status: DC
  Filled 2013-04-08: qty 1

## 2013-04-08 MED ORDER — ETOMIDATE 2 MG/ML IV SOLN
INTRAVENOUS | Status: AC
  Administered 2013-04-08: 20 mg via INTRAVENOUS
  Filled 2013-04-08: qty 20

## 2013-04-08 MED ORDER — BISOPROLOL FUMARATE 5 MG PO TABS
5.0000 mg | ORAL_TABLET | Freq: Every day | ORAL | Status: DC
  Administered 2013-04-09 – 2013-04-11 (×3): 5 mg via ORAL
  Filled 2013-04-08 (×6): qty 1

## 2013-04-08 NOTE — ED Notes (Signed)
succ 100mg  iv per k Tumeka Chimenti rn

## 2013-04-08 NOTE — ED Notes (Addendum)
Pt here by [facility], for sob/respiritory distress, arrives on CPAP increased WOB and distress, skin W&D, NSL in place, 20g, L hand, solumedrol and zofran given PTA, cbg 130, sob onset ~ 3 hrs ago, seen by her card MD last week, in and out of afib and ST, here from [facility], denies CP. Had CP last week. Dr. Bernette Mayers EDP, and RTpresent on arrival.

## 2013-04-08 NOTE — Progress Notes (Signed)
ABG resulted critical value. PaCO2 58.1. RBV to Bernette Mayers, MD at 0505.

## 2013-04-08 NOTE — ED Notes (Signed)
Etomidate 20 mg iv per k [name]

## 2013-04-08 NOTE — Progress Notes (Signed)
eLink Physician-Brief Progress Note Patient Name: [name] DOB: 1930-06-30 MRN: [id number]  Date of Service  04/08/2013   HPI/Events of Note   Best Practice  eICU Interventions  Protonix 40 mg IV daily for stress ulcer propy   Intervention Category Intermediate Interventions: Best-practice therapies (e.g. DVT, beta blocker, etc.)  DETERDING,ELIZABETH 04/08/2013, 6:16 AM

## 2013-04-08 NOTE — ED Notes (Addendum)
Daughter back at [facility] prior to transport to 2300, RT suctioning ETT. Pt calm, NAD, resting/sedated. Breathing with vent.

## 2013-04-08 NOTE — ED Notes (Addendum)
ABG drawn and resulted. 1st BC drawn from L hand IV site, lab at [facility] for 2nd [facility].

## 2013-04-08 NOTE — ED Notes (Signed)
Placed on Bipap with improvement

## 2013-04-08 NOTE — Consult Note (Signed)
CARDIOLOGY CONSULT NOTE  Patient ID: [name] MRN: [id number], DOB/AGE: 08/08/1930   Admit date: 04/08/2013 Date of Consult: 04/08/2013  Primary Physician: [name], MD Primary Cardiologist: Judie Petit. Excell Seltzer, MD  Pt. Profile  79 y/o female with h/o CAD and PAF who was admitted overnight with acute respiratory failure and CHF, who has been found to have an elevated troponin.  Problem List  Past Medical History  Diagnosis Date  . Lower extremity edema     bilateral  . CKD (chronic kidney disease), stage III     a. baseline creat 1.6->2  . Labile hypertension   . Anxiety   . Failure to thrive in childhood   . History of shingles July 2004    on the left side of her thorax and postherpetic neuralgia  . History of psoriasis   . History of recurrent UTIs   . Major depressive disorder, single episode, severe 13-Apr-2003    "when my husband died"  . Valvular heart disease     a.  11/2012 Echo: Mild AS/AI/MR;  b. 02/2013 Echo: EF 55-60%, mildly dil LA, calcified AV/MV.  Marland Kitchen CAD (coronary artery disease)     a. NSTEMI 05/2012 s/p DES to OM (residual severe LCx subbranch dz for med rx unless refractory angina recurs)  . LBBB (left bundle branch block)     intermittent  . Chronic combined systolic and diastolic CHF (congestive heart failure)     a. 05/2012 Echo: EF 60-65%;  b. 07/2012 Echo in setting of afib: EF 35%;  c. 02/2013 Echo: EF 55-60%, mildly dil LA.  Marland Kitchen Atrial fibrillation     a. Dx 07/2012 -> Amio started. b. 07/2012 Amio d/c 2/2 nausea - maintaining sinus rhythm; c. 02/2013 Coumadin initiated    Past Surgical History  Procedure Laterality Date  . Salpingoophorectomy      "cyst removed"  . Appendectomy    . Breast cyst excision    . Abdominal hysterectomy    . Cataract extraction, bilateral  ~ 2009/04/12  . Cardiac surgery      Allergies  Allergies  Allergen Reactions  . Metoprolol Swelling    Takes bisoprolol without issue  . Norvasc (Amlodipine Besylate) Swelling   Takes nisoldipine without issue  . Sulfonamide Derivatives Hives, Itching and Swelling  . Amiodarone Nausea And Vomiting  . Statins     Myalgias - "I won't take them"   HPI   79 y/o female with h/o CAD s/p NSTEMI and PCI of the OM in 05/2013 with residual LCX branch dzs.  She also has a h/o combined syst/diast CHF, especially in the setting of PAF, with EF previously documented as low as 35% in 07/2012.  Most recent EF was 55-60% in 02/2013, at which time she was hospitalized for recurrent afib and chf.  She converted to sinus and was discharged home on bisoprolol and diltiazem, along with coumadin.  She was last seen in clinic on 5/9, at which time she was noted to have a diffuse rash, presumed to be from either warfarin or diltiazem.  Dilt was d/c'd and she was given a medrol dose pack.  She did report occasional chest discomfort, which was different from prior angina.  Per notes, pt presented to the ED early this AM with dyspnea and severe respiratory distress requiring CPAP and eventually intubation.  CXR showed interstitial prominence with superimposed interstitial edema with bilateral opacities.  WBC was 23K on admission, though she has been afebrile (and has been on a  medrol dose pack).  Initial troponin returned elevated @ 0.69 with most recent troponin rising to 2.41.  Admission pBNP was >11K.  She remains intubated and sedated.  Inpatient Medications  . albuterol  6 puff Inhalation Q6H  . antiseptic oral rinse  15 mL Mouth Rinse QID  . chlorhexidine  15 mL Mouth Rinse BID  . furosemide  40 mg Intravenous BID  . heparin subcutaneous  5,000 Units Subcutaneous Q8H  . lidocaine (cardiac) 100 mg/67ml      . pantoprazole (PROTONIX) IV  40 mg Intravenous Q24H  . piperacillin-tazobactam (ZOSYN)  IV  2.25 g Intravenous Q6H  . rocuronium      . vancomycin  1,000 mg Intravenous Q48H   Family History - From EMR (pt  Intubated/sedated) Family History  Problem Relation Age of Onset  . Diabetes      . Cancer    . Lung cancer      Social History - From EMR (pt  Intubated/sedated) History   Social History  . Marital Status: Widowed    Spouse Name: N/A    Number of Children: N/A  . Years of Education: N/A   Occupational History  . Not on file.   Social History Main Topics  . Smoking status: Former Smoker -- 1.50 packs/day for 30 years    Types: Cigarettes    Quit date: 09/01/1979  . Smokeless tobacco: Never Used  . Alcohol Use: Yes     Comment: 06/06/12 "mixed drink once or twice a year.to celebrate"  . Drug Use: No  . Sexually Active: No   Other Topics Concern  . Not on file   Social History Narrative   Lives in [place].    Review of Systems - Unable to obtain 2/2 intubated/sedated status.  Physical Exam  Blood pressure 153/50, pulse 51, temperature 97.8 F (36.6 C), temperature source Oral, resp. rate 16, height 5\' 1"  (1.549 m), weight 135 lb 9.3 oz (61.5 kg), SpO2 100.00%.  General:  Intubated/sedated. Psych:  Intubated/sedated. Neuro: Intubated/sedated. HEENT: Normal  Neck: Supple without bruits.  JVP ~ 12 Lungs:  Resp regular and unlabored, diminished breath sounds bilat, R more so than L, bibasilar crackles. Heart: RRR, brady, 2/6 SEM bilat USB, 2/6 apex->axilla.  No s3, s4. Abdomen: Soft, non-tender, non-distended, BS + x 4.  Extremities: No clubbing, cyanosis or edema. DP/PT/Radials 1+ and equal bilaterally.  Labs   Recent Labs  04/08/13 0246 04/08/13 1120  TROPONINI 0.69* 2.41*   Lab Results  Component Value Date   WBC 13.6* 04/08/2013   HGB 10.9* 04/08/2013   HCT 32.2* 04/08/2013   MCV 88.0 04/08/2013   PLT 240 04/08/2013     Recent Labs Lab 04/08/13 1104  NA 135  K 3.6  CL 100  CO2 23  BUN 40*  CREATININE 2.15*  CALCIUM 8.8  PROT 6.5  BILITOT 0.4  ALKPHOS 46  ALT 21  AST 38*  GLUCOSE 140*   Lab Results  Component Value Date   CHOL 223* 03/16/2013   HDL 59 03/16/2013   LDLCALC 140* 03/16/2013   TRIG 119 03/16/2013   Lab  Results  Component Value Date   INR 2.43* 04/08/2013   INR 3.1 04/01/2013   INR 3.9 03/25/2013    Radiology/Studies  Dg Chest Port 1 View  04/08/2013  *RADIOLOGY REPORT*  Clinical Data: Post intubation  PORTABLE CHEST - 1 VIEW  IMPRESSION: Endotracheal tube tip 6 cm proximal to the carina.  NG tube descends below the level  of the image with the side port just below the GE junction.  Consider advancing 5 cm.  Interstitial prominence and bibasilar opacities are similar to prior.   Original Report Authenticated By: Jearld Lesch, M.D.    ECG  Sinus, 96, lbbb, more pronounced inferolateral st depression.  ASSESSMENT AND PLAN  1.  Acute respiratory failure:  Presumably multifactorial in setting of pulmonary opacities and CHF.  Agree with IV diuresis as weight is roughly 4-5 lbs above her previously achieved dry weight of ~132 in April.  Abx per CCM.  2.  Acute on chronic diastolic CHF:  Diuresis as above.  Follow renal fxn closely.  HR controlled currently, BP elevated.  Resume bisoprolol with hold parameters.  In past, chf was most likely 2/2 recurrent afib, though she has been in sinus on this occasion.  ? Role of medrol dose pack in fluid retention.  3.  NSTEMI/CAD:  Troponin elevation in the setting of above.  Cont to follow trend.  She has known, residual LCX sub-branch dzs.  Suspect demand ischemia.  INR currently therapeutic, though coumadin on hold in case she requires invasive procedure.  Prefer to avoid invasive procedure/contrast given CKD.  Cont asa 81, bb, long-acting nitrate as bp allows.  She is intolerant to multiple statins.  4.  Stage IV CKD:  Creat 2.1.  Follow with diuresis.  4.  PAF:  In sinus.  Dilt recently d/c'd 2/2 rash with plan to d/c coumadin and consider alternative agent if rash did not clear off of dilt. If coumadin is determined to be the offending agent, she would be candidate for renally dosed NOAC.  Cont bb as bp allows, though she is currently  bradycardic.  Signed, [name], NP 04/08/2013, 1:49 PM Patient seen and examined. I agree with the assessment and plan as detailed above. See also my additional thoughts below.   I have reviewed the case with Mr. Brion Aliment completely. I agree with the note above as adjusted. At this time the primary issuehe is acute respiratory failure.this may be due to a combination of factors including pulmonary infection and fluid overload. Both of these will be treated watching her renal function. It is not clear what affect if any the patient's oral steroids had on her presentation.  The patient's troponin elevation is probably a secondary phenomenon. At this point there is no plan for aggressive Susette Racer at intervention.  In the past she's had heart failure related to rapid atrial fibrillation. This does not appear to be the case at this time. There is an ongoing issue of whether or not her rash could be from Coumadin. Eventually will have to be decided what anticoagulant could be used going forward.  Willa Rough, MD, [facility] 04/08/2013 2:29 PM

## 2013-04-08 NOTE — H&P (Signed)
PULMONARY  / CRITICAL CARE MEDICINE  Name: [name] MRN: [id number] DOB: 10/02/1930    ADMISSION DATE:  04/08/2013  PRIMARY SERVICE: PCCM  CHIEF COMPLAINT:  SOB  BRIEF PATIENT DESCRIPTION:  79 years old with PMH relevant for aortic insufficiency and MR, CHF with LVEF of 35%, A. Fib on coumadin, LBBB, CKD. Presents with two days history of SOB. In the ED found hypoxic and in severe respiratory distress. Failed BiPAP and required intubation.  LINES / TUBES: - Peripheral IV's  CULTURES: Blood cultures ordered Urine culture ordered Tracheal aspirate ordered  ANTIBIOTICS: - Zosyn - Vancomycin  HISTORY OF PRESENT ILLNESS:   79 years old with PMH relevant for aortic insufficiency and MR, CHF with LVEF of 35%, A. Fib on coumadin, LBBB, CKD. Presents with two days history of SOB. As per the family she did not complained of chest pain, cough, fever or sputum production. At arrival to the ED found hypoxic and in severe respiratory distress. Initially tried on BiPAP but she failed and required intubation. Chest X ray with bilateral basilar infiltrates suggestive of pulmonary edema. Pneumonia is possible specially with a WBC of 23.2 and a recent hospitalization (for A. Fib). At the time of my examination the patient is intubated, breathing comfortably at the vent rate, saturating 99% still on 100% FiO2, hemodynamically stable.   PAST MEDICAL HISTORY :  Past Medical History  Diagnosis Date  . Lower extremity edema     bilateral  . CKD (chronic kidney disease), stage III     a. baseline creat 1.6->2  . Labile hypertension   . Anxiety   . Failure to thrive in childhood   . History of shingles July 2004    on the left side of her thorax and postherpetic neuralgia  . History of psoriasis   . History of recurrent UTIs   . Major depressive disorder, single episode, severe 05/01/03    "when my husband died"  . Valvular heart disease     a. 07/2012 Echo in setting of afib: EF 35%, Mild  AI/MR, Mod Dil LA, Mod TR,  . CAD (coronary artery disease)     a. NSTEMI 05/2012 s/p DES to OM 06/07/12 (residual severe LCx subbranch dz for med rx unless refractory angina recurs)  . LBBB (left bundle branch block)     intermittent  . Chronic combined systolic and diastolic CHF (congestive heart failure)     a. 05/2012 Echo: EF 60-65%, Gr 1 DD, mild AI/MR, Mod TR;  b. 07/2012 Echo in setting of afib: EF 35%, Mild AI/MR, Mod Dil LA, Mod TR, PASP .  . Atrial fibrillation     a. Dx 07/2012 -> Amio started.  No coumadin 2/2 ongoing DAPT;  b. 08/15/2012 Amio d/c 2/2 nausea - maintaining sinus rhythm.   Past Surgical History  Procedure Laterality Date  . Salpingoophorectomy      "cyst removed"  . Appendectomy    . Breast cyst excision    . Abdominal hysterectomy    . Cataract extraction, bilateral  ~ 04-30-2009  . Cardiac surgery     Prior to Admission medications   Medication Sig Start Date End Date Taking? Authorizing Provider  aspirin EC 81 MG tablet Take 1 tablet (81 mg total) by mouth daily. 06/08/12  Yes Dayna N Dunn, PA-C  b complex vitamins tablet Take 1 tablet by mouth daily.     Yes Historical Provider, MD  bisoprolol (ZEBETA) 10 MG tablet Take 10 mg by mouth 2 (  two) times daily. 03/22/13  Yes Rhonda G Barrett, PA-C  Cholecalciferol (VITAMIN D-3) 5000 UNITS TABS Take 1 tablet by mouth daily.   Yes Historical Provider, MD  famotidine (PEPCID AC) 10 MG tablet Take 1 tablet (10 mg total) by mouth 2 (two) times daily. 04/05/13  Yes Tonny Bollman, MD  fish oil-omega-3 fatty acids 1000 MG capsule Take 1 g by mouth 2 (two) times daily.    Yes Historical Provider, MD  isosorbide mononitrate (IMDUR) 30 MG 24 hr tablet Take 30 mg by mouth daily.   Yes Historical Provider, MD  L-Lysine 500 MG CAPS Take 1 capsule by mouth 2 (two) times daily.   Yes Historical Provider, MD  loratadine (CLARITIN) 10 MG tablet Take 10 mg by mouth daily.   Yes Historical Provider, MD  methylPREDNISolone (MEDROL DOSEPAK)  4 MG tablet Take 4 mg by mouth 2 (two) times daily. follow package directions 04/05/13  Yes Tonny Bollman, MD  nitroGLYCERIN (NITROSTAT) 0.4 MG SL tablet Place 1 tablet (0.4 mg total) under the tongue every 5 (five) minutes x 3 doses as needed for chest pain. 06/08/12 06/08/13 Yes Dayna N Dunn, PA-C  warfarin (COUMADIN) 5 MG tablet Take 2.5-5 mg by mouth daily. Currently alternating every other day-2.5-5mg . Last dose was 5mg  03/28/2013 03/22/13  Yes Rhonda G Barrett, PA-C   Allergies  Allergen Reactions  . Metoprolol Swelling    Takes bisoprolol without issue  . Norvasc (Amlodipine Besylate) Swelling    Takes nisoldipine without issue  . Sulfonamide Derivatives Hives, Itching and Swelling  . Amiodarone Nausea And Vomiting  . Statins     Myalgias - "I won't take them"    FAMILY HISTORY:  Family History  Problem Relation Age of Onset  . Diabetes    . Cancer    . Lung cancer     SOCIAL HISTORY:  reports that she quit smoking about 33 years ago. Her smoking use included Cigarettes. She has a 45 pack-year smoking history. She has never used smokeless tobacco. She reports that  drinks alcohol. She reports that she does not use illicit drugs.  REVIEW OF SYSTEMS:  Unable to provide  SUBJECTIVE:   VITAL SIGNS: Temp:  [96.8 F (36 C)-97.3 F (36.3 C)] 96.8 F (36 C) (05/12 0445) Pulse Rate:  [53-98] 56 (05/12 0445) Resp:  [14-28] 14 (05/12 0445) BP: (116-190)/(44-100) 160/67 mmHg (05/12 0445) SpO2:  [95 %-100 %] 99 % (05/12 0445) FiO2 (%):  [60 %] 60 % (05/12 0305) Weight:  [136 lb (61.689 kg)] 136 lb (61.689 kg) (05/12 0345) HEMODYNAMICS:   VENTILATOR SETTINGS: Vent Mode:  [-] PRVC FiO2 (%):  [60 %] 60 % Set Rate:  [18 bmp] 18 bmp Vt Set:  [380 mL] 380 mL PEEP:  [5 cmH20] 5 cmH20 Plateau Pressure:  [14 cmH20] 14 cmH20 INTAKE / OUTPUT: Intake/Output   None     PHYSICAL EXAMINATION: General: No apparent distress. Intubated, sedated. Eyes: Anicteric sclerae. ENT: ETT in place,  trache at midline. Lymph: No cervical, supraclavicular, or axillary lymphadenopathy. Heart: Systolic and diastolic murmur appreciated. No bruits, equal pulses. Lungs: Bilateral crackles more pronounced to the bases. No Wheezing.  Abdomen: Abdomen soft, non-tender and not distended, normoactive bowel sounds. No hepatosplenomegaly or masses. Musculoskeletal: No clubbing or synovitis. Skin: Has patchy erythematous plaques in her trunk and extremities. Neuro: Sedated, unresponsive after intubation.   LABS:  Recent Labs Lab 04/01/13 1410 04/08/13 0142 04/08/13 0143 04/08/13 0206 04/08/13 0222 04/08/13 0246  HGB  --  11.0*  --   --  11.6*  --   WBC  --  23.2*  --   --   --   --   PLT  --  323  --   --   --   --   NA  --  132*  --   --  135  --   K  --  3.9  --   --  4.0  --   CL  --  100  --   --  106  --   CO2  --  20  --   --   --   --   GLUCOSE  --  178*  --   --  178*  --   BUN  --  37*  --   --  40*  --   CREATININE  --  2.06*  --   --  1.80*  --   CALCIUM  --  8.7  --   --   --   --   AST  --  34  --   --   --   --   ALT  --  21  --   --   --   --   ALKPHOS  --  50  --   --   --   --   BILITOT  --  0.3  --   --   --   --   PROT  --  7.1  --   --   --   --   ALBUMIN  --  3.8  --   --   --   --   APTT  --  34  --   --   --   --   INR 3.1 2.43*  --   --   --   --   LATICACIDVEN  --   --   --   --  1.07  --   TROPONINI  --   --   --   --   --  0.69*  PROBNP  --   --  11286.0*  --   --   --   PHART  --   --   --  7.154*  --   --   PCO2ART  --   --   --  65.6*  --   --   PO2ART  --   --   --  119.0*  --   --    No results found for this basename: GLUCAP,  in the last 168 hours  CXR:  Bilateral infiltrates more pronounced to the bases.   ASSESSMENT / PLAN:  PULMONARY A: 1) Acute hypoxemic and hypercarbic respiratory failure 2) Pulmonary edema is the most likely cause of her respiratory failure (BNP 11286) Still cannot rule out HCAP given WBC of 23 and history of recent  admission. P:   - We will admit to ICU - Mechanical ventilation   - PRVC, Vt: 8cc/kg, PEEP: 5, RR: 16, FiO2: 100% and adjust to keep O2 sat > 94% - VAP prevention order set - Will start empiric Zosyn and vancomycin. We will discontinue if negative cultures or quick clinical and radiological improvement with diuresis. - We will get tracheal aspirate culture, urine legionella, urine strep and influenza PCR.  CARDIOVASCULAR A:  1) Acute pulmonary edema 2) Atrial fibrillation on coumadin 3) Chronic systolic heart failure with LVEF of 35% P:  - Will continue lasix 40 mg IV BID if BP tolerates - We  will hold home antihypertensives - We will continue coumadin  RENAL A:   1) creatinine at baseline P:   - Will follow CMP  GASTROINTESTINAL A:   1) No issues P:   - GI prophylaxis with protonix  HEMATOLOGIC A:   1) No issues P:  - Therapeutic on Coumadin, does not need heparin for DVT prophylaxis  - SCD's - Will follow CBC  INFECTIOUS A:   1) Questionable HCAP P:   - Start empiric antibiotics as above.  - Will discontinue if WBC trending down, negative cultures or quick improvement with diuresis.  ENDOCRINE A:   1) No issues    NEUROLOGIC A:   1) No issues P:   - Continuous sedation with versed and fentanyl   I have personally obtained a history, examined the patient, evaluated laboratory and imaging results, formulated the assessment and plan and placed orders. CRITICAL CARE:  Critical Care Time devoted to patient care services described in this note is 60 minutes.   Overton Mam, MD Pulmonary and Critical Care Medicine [facility] Pager: [phone]  04/08/2013, 5:03 AM

## 2013-04-08 NOTE — ED Provider Notes (Signed)
History     CSN: [id number]  Arrival date & time 04/08/13  0137   First MD Initiated Contact with Patient 04/08/13 0139      Chief Complaint  Patient presents with  . Shortness of Breath  . Respiratory Distress    (Consider location/radiation/quality/duration/timing/severity/associated sxs/prior treatment) Patient is a 79 y.o. female presenting with shortness of breath.  Shortness of Breath  Level 5 caveat due to urgent need for intevention Pt with history of CAD, pAfib and CHF brought to the ED via EMS from home with increasing SOB during the evening today. Started on CPAP enroute with sats remaining in the 80s. Given solumedrol for ?COPD. Was not complaining of CP.   Past Medical History  Diagnosis Date  . Lower extremity edema     bilateral  . CKD (chronic kidney disease), stage III     a. baseline creat 1.6->2  . Labile hypertension   . Anxiety   . Failure to thrive in childhood   . History of shingles July 2004    on the left side of her thorax and postherpetic neuralgia  . History of psoriasis   . History of recurrent UTIs   . Major depressive disorder, single episode, severe 2003/04/11    "when my husband died"  . Valvular heart disease     a. 07/2012 Echo in setting of afib: EF 35%, Mild AI/MR, Mod Dil LA, Mod TR,  . CAD (coronary artery disease)     a. NSTEMI 05/2012 s/p DES to OM 06/07/12 (residual severe LCx subbranch dz for med rx unless refractory angina recurs)  . LBBB (left bundle branch block)     intermittent  . Chronic combined systolic and diastolic CHF (congestive heart failure)     a. 05/2012 Echo: EF 60-65%, Gr 1 DD, mild AI/MR, Mod TR;  b. 07/2012 Echo in setting of afib: EF 35%, Mild AI/MR, Mod Dil LA, Mod TR, PASP .  . Atrial fibrillation     a. Dx 07/2012 -> Amio started.  No coumadin 2/2 ongoing DAPT;  b. 08/15/2012 Amio d/c 2/2 nausea - maintaining sinus rhythm.    Past Surgical History  Procedure Laterality Date  . Salpingoophorectomy       "cyst removed"  . Appendectomy    . Breast cyst excision    . Abdominal hysterectomy    . Cataract extraction, bilateral  ~ Apr 10, 2009  . Cardiac surgery      Family History  Problem Relation Age of Onset  . Diabetes    . Cancer    . Lung cancer      History  Substance Use Topics  . Smoking status: Former Smoker -- 1.50 packs/day for 30 years    Types: Cigarettes    Quit date: 09/01/1979  . Smokeless tobacco: Never Used  . Alcohol Use: Yes     Comment: 06/06/12 "mixed drink once or twice a year.to celebrate"    OB History   Grav Para Term Preterm Abortions TAB SAB Ect Mult Living                  Review of Systems  Respiratory: Positive for shortness of breath.    Unable to assess due to mental status.   Allergies  Metoprolol; Norvasc; Sulfonamide derivatives; Amiodarone; and Statins  Home Medications   Current Outpatient Rx  Name  Route  Sig  Dispense  Refill  . aspirin EC 81 MG tablet   Oral   Take 1 tablet (81 mg total)  by mouth daily.         Marland Kitchen b complex vitamins tablet   Oral   Take 1 tablet by mouth daily.           . bisoprolol (ZEBETA) 10 MG tablet   Oral   Take 10 mg by mouth 2 (two) times daily.         . Cholecalciferol (VITAMIN D-3) 5000 UNITS TABS   Oral   Take 1 tablet by mouth daily.         . famotidine (PEPCID AC) 10 MG tablet   Oral   Take 1 tablet (10 mg total) by mouth 2 (two) times daily.         . fish oil-omega-3 fatty acids 1000 MG capsule   Oral   Take 1 g by mouth 2 (two) times daily.          . isosorbide mononitrate (IMDUR) 30 MG 24 hr tablet   Oral   Take 30 mg by mouth daily.         Marland Kitchen L-Lysine 500 MG CAPS   Oral   Take 1 capsule by mouth 2 (two) times daily.         Marland Kitchen loratadine (CLARITIN) 10 MG tablet   Oral   Take 10 mg by mouth daily.         . methylPREDNISolone (MEDROL DOSEPAK) 4 MG tablet   Oral   Take 4 mg by mouth 2 (two) times daily. follow package directions         . nitroGLYCERIN  (NITROSTAT) 0.4 MG SL tablet   Sublingual   Place 1 tablet (0.4 mg total) under the tongue every 5 (five) minutes x 3 doses as needed for chest pain.   25 tablet   4   . warfarin (COUMADIN) 5 MG tablet   Oral   Take 2.5-5 mg by mouth daily. Currently alternating every other day-2.5-5mg . Last dose was 5mg  03/28/2013           BP 187/95  Pulse 77  Temp(Src) 97.3 F (36.3 C) (Oral)  Resp 24  SpO2 100%  Physical Exam  Nursing note and vitals reviewed. Constitutional: She appears well-developed and well-nourished.  HENT:  Head: Normocephalic and atraumatic.  Eyes: EOM are normal. Pupils are equal, round, and reactive to light.  Neck: Neck supple.  Cardiovascular: Normal rate, normal heart sounds and intact distal pulses.   Pulmonary/Chest: She has rales (bases, bilateral).  Abdominal: Bowel sounds are normal. She exhibits no distension. There is no tenderness.  Musculoskeletal: Normal range of motion. She exhibits edema (trace bilaterally). She exhibits no tenderness.  Neurological: She is alert. She has normal strength. No cranial nerve deficit or sensory deficit.  Skin: Skin is warm and dry. Rash (petechial rash on legs and arms) noted.  Psychiatric: She has a normal mood and affect.    ED Course  Procedures (including critical care time) INTUBATION Performed by: Susy Frizzle B.  Required items: required blood products, implants, devices, and special equipment available Patient identity confirmed: provided demographic data and hospital-assigned identification number Time out: Immediately prior to procedure a "time out" was called to verify the correct patient, procedure, equipment, support staff and site/side marked as required.  Indications: respiratory failure  Intubation method: Glidescope Laryngoscopy   Preoxygenation: biPAP  Sedatives: Etomidate Paralytic: Succinylcholine  Tube Size: 7.5 cuffed  Post-procedure assessment: chest rise and ETCO2  monitor Breath sounds: equal and absent over the epigastrium Tube secured with: ETT holder Chest  x-ray interpreted by radiologist and me.  Chest x-ray findings: endotracheal tube in appropriate position  Patient tolerated the procedure well with no immediate complications.     Labs Reviewed  CBC WITH DIFFERENTIAL - Abnormal; Notable for the following:    WBC 23.2 (*)    RBC 3.64 (*)    Hemoglobin 11.0 (*)    HCT 32.3 (*)    Neutro Abs 17.5 (*)    Monocytes Absolute 1.4 (*)    Eosinophils Relative 6 (*)    Eosinophils Absolute 1.4 (*)    All other components within normal limits  COMPREHENSIVE METABOLIC PANEL - Abnormal; Notable for the following:    Sodium 132 (*)    Glucose, Bld 178 (*)    BUN 37 (*)    Creatinine, Ser 2.06 (*)    GFR calc non Af Amer 21 (*)    GFR calc Af Amer 25 (*)    All other components within normal limits  PRO B NATRIURETIC PEPTIDE - Abnormal; Notable for the following:    Pro B Natriuretic peptide (BNP) 11286.0 (*)    All other components within normal limits  PROTIME-INR - Abnormal; Notable for the following:    Prothrombin Time 25.3 (*)    INR 2.43 (*)    All other components within normal limits  URINALYSIS, ROUTINE W REFLEX MICROSCOPIC - Abnormal; Notable for the following:    APPearance CLOUDY (*)    Hgb urine dipstick MODERATE (*)    Protein, ur 100 (*)    Leukocytes, UA MODERATE (*)    All other components within normal limits  TROPONIN I - Abnormal; Notable for the following:    Troponin I 0.69 (*)    All other components within normal limits  POCT I-STAT 3, BLOOD GAS (G3+) - Abnormal; Notable for the following:    pH, Arterial 7.154 (*)    pCO2 arterial 65.6 (*)    pO2, Arterial 119.0 (*)    Acid-base deficit 7.0 (*)    All other components within normal limits  POCT I-STAT, CHEM 8 - Abnormal; Notable for the following:    BUN 40 (*)    Creatinine, Ser 1.80 (*)    Glucose, Bld 178 (*)    Hemoglobin 11.6 (*)    HCT 34.0 (*)     All other components within normal limits  POCT I-STAT TROPONIN I - Abnormal; Notable for the following:    Troponin i, poc 0.60 (*)    All other components within normal limits  APTT  URINE MICROSCOPIC-ADD ON  CG4 I-STAT (LACTIC ACID)   Dg Chest Port 1 View  04/08/2013  *RADIOLOGY REPORT*  Clinical Data: Shortness of breath, hypoxia  PORTABLE CHEST - 1 VIEW  Comparison: 03/17/2013  Findings: Hyperinflation with interstitial coarsening.  Bibasilar opacities, increased on the left.  Small effusions suggested.  No pneumothorax.  Aortic tortuosity atherosclerosis. Heart size upper normal to mildly enlarged.  No pneumothorax.  Osteopenia and multilevel degenerative change.  IMPRESSION: Interstitial prominence is in part chronic though superimposed interstitial edema is suspected.  Bibasilar opacities; atelectasis versus infiltrate, mildly increased on the left.   Original Report Authenticated By: Jearld Lesch, M.D.      1. Respiratory failure   2. CHF (congestive heart failure)   3. Hypertensive emergency   4. CKD (chronic kidney disease)   5. Non-STEMI (non-ST elevated myocardial infarction)       MDM   Date: 04/08/2013  Rate: 96  Rhythm: normal sinus rhythm  QRS Axis: left  Intervals: normal  ST/T Wave abnormalities: nonspecific ST/T changes  Conduction Disutrbances:left bundle branch block  Narrative Interpretation:   Old EKG Reviewed: unchanged  Pt had initial trial of BiPAP with good improvement in SpO2 but still tachypneic in the 30s and getting tired and so intubated as above. She has a respiratory acidosis on initial ABG, pulmonary edema on CXR and hypertensive. Given Lasix and NTG drip.    CRITICAL CARE Performed by: Pollyann Savoy Total critical care time: 60 Critical care time was exclusive of separately billable procedures and treating other patients. Critical care was necessary to treat or prevent imminent or life-threatening deterioration. Critical care  was time spent personally by me on the following activities: development of treatment plan with patient and/or surrogate as well as nursing, discussions with consultants, evaluation of patient's response to treatment, examination of patient, obtaining history from patient or surrogate, ordering and performing treatments and interventions, ordering and review of laboratory studies, ordering and review of radiographic studies, pulse oximetry and re-evaluation of patient's condition.         Zaelynn Fuchs B. Bernette Mayers, MD 04/08/13 [phone]

## 2013-04-08 NOTE — Progress Notes (Addendum)
ANTICOAGULATION CONSULT NOTE - Initial Consult  Pharmacy Consult for Heparin  Indication: chest pain/ACS  Allergies  Allergen Reactions  . Metoprolol Swelling    Takes bisoprolol without issue  . Norvasc (Amlodipine Besylate) Swelling    Takes nisoldipine without issue  . Sulfonamide Derivatives Hives, Itching and Swelling  . Amiodarone Nausea And Vomiting  . Statins     Myalgias - "I won't take them"    Patient Measurements: Height: 5\' 1"  (154.9 cm) Weight: 136 lb (61.689 kg) IBW/kg (Calculated) : 47.8 Heparin Dosing Weight: 60 kg  Vital Signs: Temp: 97.3 F (36.3 C) (05/12 0345) Temp src: Oral (05/12 0235) BP: 161/75 mmHg (05/12 0345) Pulse Rate: 68 (05/12 0345)  Labs:  Recent Labs  04/08/13 0142 04/08/13 0222 04/08/13 0246  HGB 11.0* 11.6*  --   HCT 32.3* 34.0*  --   PLT 323  --   --   APTT 34  --   --   LABPROT 25.3*  --   --   INR 2.43*  --   --   CREATININE 2.06* 1.80*  --   TROPONINI  --   --  0.69*    Estimated Creatinine Clearance: 20.7 ml/min (by C-G formula based on Cr of 1.8).   Medical History: Past Medical History  Diagnosis Date  . Lower extremity edema     bilateral  . CKD (chronic kidney disease), stage III     a. baseline creat 1.6->2  . Labile hypertension   . Anxiety   . Failure to thrive in childhood   . History of shingles July 2004    on the left side of her thorax and postherpetic neuralgia  . History of psoriasis   . History of recurrent UTIs   . Major depressive disorder, single episode, severe 04-25-2003    "when my husband died"  . Valvular heart disease     a. 07/2012 Echo in setting of afib: EF 35%, Mild AI/MR, Mod Dil LA, Mod TR,  . CAD (coronary artery disease)     a. NSTEMI 05/2012 s/p DES to OM 06/07/12 (residual severe LCx subbranch dz for med rx unless refractory angina recurs)  . LBBB (left bundle branch block)     intermittent  . Chronic combined systolic and diastolic CHF (congestive heart failure)     a. 05/2012  Echo: EF 60-65%, Gr 1 DD, mild AI/MR, Mod TR;  b. 07/2012 Echo in setting of afib: EF 35%, Mild AI/MR, Mod Dil LA, Mod TR, PASP .  . Atrial fibrillation     a. Dx 07/2012 -> Amio started.  No coumadin 2/2 ongoing DAPT;  b. 08/15/2012 Amio d/c 2/2 nausea - maintaining sinus rhythm.    Assessment: 83 YOF on chronic coumadin for afib. Pharmacy is consulted to start IV heparin for ACS, troponin mildly elevated. INR = 2.43, last coumadin dose on Apr 25, 2023 per [facility].    Goal of Therapy:  Heparin level 0.3-0.7 units/ml Start IV heparin when INR < 2 Monitor platelets by anticoagulation protocol: Yes   Plan:  - Hold off IV heparin, since INR is still >2 - f/u INR tomorrow morning and start IV heparin if INR is < 2 if still indicated.  Bayard Hugger, PharmD, BCPS  Clinical Pharmacist  Pager: [phone]   04/08/2013,3:56 AM  Addendum: Pharmacy is consulted to start vancomycin and zosyn for pneumonia, blood and tracheal aspirate cultures are pending. Scr. 1.8, est. crcl ~ 20.   Goal of Therapy:  Vancomycin trough level 15-20 mcg/ml  Plan:  - vancomycin 1g IV Q 48 hrs - zosyn 2.25g IV Q 6 hrs - f/u renal function and cultures - vancomycin trough at steady state if indicated  Addendum: Heparin consult is d/c'd per CCM, will resume coumadin per pharmacy for hx of afib. INR 2.43 this morning. PTA dose: 2.5/5mg  alternating every other day. hgb 11, plt 323  Plan:  - Coumadin 2.5mg  po x 1 - Daily PT/ INR

## 2013-04-08 NOTE — Progress Notes (Signed)
INITIAL NUTRITION ASSESSMENT  DOCUMENTATION CODES Per approved criteria  -Not Applicable   INTERVENTION:  If EN started, recommend Jevity 1.2 formula at goal rate of 25 ml/hr with Prostat liquid protein 4 times daily to provide 1120 total kcals, 93 gm protein, 484 ml of free water  Recommend liquid MVI daily RD to follow for nutrition care plan  NUTRITION DIAGNOSIS: Inadequate oral intake related to inability to eat as evidenced by NPO status  Goal: Initiate EN support within next 24-48 hours if prolonged intubation expected  Monitor:  EN initiation, respiratory status, weight, labs, I/O'  Reason for Assessment: VDRF  79 y.o. female  Admitting Dx: shortness of breath  ASSESSMENT: Patient with PMH of aortic insufficiency, CHF with LVEF of 35%, A. Fib on coumadin, LBBB, CKD; presented with two day history of SOB; in ED found hypoxic and in severe respiratory distress ---> failed BiPAP and required intubation.   Patient is currently intubated on ventilator support ---> OGT in place MV: 7.8 Temp: 36.1  Height: Ht Readings from Last 1 Encounters:  04/08/13 5\' 1"  (1.549 m)    Weight: Wt Readings from Last 1 Encounters:  04/08/13 135 lb 9.3 oz (61.5 kg)    Ideal Body Weight: 105 lb  % Ideal Body Weight: 128%  Wt Readings from Last 10 Encounters:  04/08/13 135 lb 9.3 oz (61.5 kg)  04/05/13 136 lb (61.689 kg)  04/03/13 136 lb (61.689 kg)  03/22/13 131 lb 1.6 oz (59.467 kg)  03/12/13 135 lb (61.236 kg)  01/23/13 135 lb (61.236 kg)  11/13/12 134 lb (60.782 kg)  09/11/12 131 lb 6.4 oz (59.603 kg)  08/27/12 128 lb (58.06 kg)  08/21/12 126 lb 12.8 oz (57.516 kg)    Usual Body Weight: 136 lb  % Usual Body Weight: 99%  BMI:  Body mass index is 25.63 kg/(m^2).  Estimated Nutritional Needs: Kcal: 1030-1150 Protein: 85-95 gm Fluid: per MD  Skin: skin tear to sacrum  Diet Order: NPO  EDUCATION NEEDS: -No education needs identified at this  time   Intake/Output Summary (Last 24 hours) at 04/08/13 1110 Last data filed at 04/08/13 0830  Gross per 24 hour  Intake     57 ml  Output    750 ml  Net   -693 ml    Labs:   Recent Labs Lab 04/08/13 0142 04/08/13 0222  NA 132* 135  K 3.9 4.0  CL 100 106  CO2 20  --   BUN 37* 40*  CREATININE 2.06* 1.80*  CALCIUM 8.7  --   GLUCOSE 178* 178*    Scheduled Meds: . albuterol  6 puff Inhalation Q6H  . chlorhexidine      . furosemide  40 mg Intravenous BID  . heparin subcutaneous  5,000 Units Subcutaneous Q8H  . lidocaine (cardiac) 100 mg/28ml      . pantoprazole (PROTONIX) IV  40 mg Intravenous Q24H  . piperacillin-tazobactam (ZOSYN)  IV  2.25 g Intravenous Q6H  . rocuronium      . vancomycin  1,000 mg Intravenous Q48H    Continuous Infusions: . fentaNYL infusion INTRAVENOUS 50 mcg/hr (04/08/13 0830)  . midazolam (VERSED) infusion 1 mg/hr (04/08/13 0730)    Past Medical History  Diagnosis Date  . Lower extremity edema     bilateral  . CKD (chronic kidney disease), stage III     a. baseline creat 1.6->2  . Labile hypertension   . Anxiety   . Failure to thrive in childhood   . History  of shingles July 2004    on the left side of her thorax and postherpetic neuralgia  . History of psoriasis   . History of recurrent UTIs   . Major depressive disorder, single episode, severe 07-May-2003    "when my husband died"  . Valvular heart disease     a. 07/2012 Echo in setting of afib: EF 35%, Mild AI/MR, Mod Dil LA, Mod TR,  . CAD (coronary artery disease)     a. NSTEMI 05/2012 s/p DES to OM 06/07/12 (residual severe LCx subbranch dz for med rx unless refractory angina recurs)  . LBBB (left bundle branch block)     intermittent  . Chronic combined systolic and diastolic CHF (congestive heart failure)     a. 05/2012 Echo: EF 60-65%, Gr 1 DD, mild AI/MR, Mod TR;  b. 07/2012 Echo in setting of afib: EF 35%, Mild AI/MR, Mod Dil LA, Mod TR, PASP .  . Atrial fibrillation     a.  Dx 07/2012 -> Amio started.  No coumadin 2/2 ongoing DAPT;  b. 08/15/2012 Amio d/c 2/2 nausea - maintaining sinus rhythm.    Past Surgical History  Procedure Laterality Date  . Salpingoophorectomy      "cyst removed"  . Appendectomy    . Breast cyst excision    . Abdominal hysterectomy    . Cataract extraction, bilateral  ~ May 06, 2009  . Cardiac surgery      Maureen Chatters, RD, LDN Pager #: [phone] After-Hours Pager #: [phone]

## 2013-04-08 NOTE — ED Notes (Signed)
Dr Bernette Mayers

## 2013-04-08 NOTE — Progress Notes (Signed)
PCCM Interval Note  Pt evaluated, all notes reviewed.  She apparently has had hives since she was started on coumadin late April. Rash noted on my exam this am  Filed Vitals:   04/08/13 0817 04/08/13 0830 04/08/13 0900 04/08/13 0903  BP: 151/55 156/54 169/61   Pulse: 48 46 57   Temp:      TempSrc:      Resp: 16 16 16    Height:      Weight:      SpO2: 100% 100% 100% 100%    Recent Labs Lab 04/08/13 0142 04/08/13 0222  HGB 11.0* 11.6*  HCT 32.3* 34.0*  WBC 23.2*  --   PLT 323  --     Recent Labs Lab 04/08/13 0142 04/08/13 0222  NA 132* 135  K 3.9 4.0  CL 100 106  CO2 20  --   GLUCOSE 178* 178*  BUN 37* 40*  CREATININE 2.06* 1.80*  CALCIUM 8.7  --     Recent Labs Lab 04/01/13 1410 04/08/13 0142  INR 3.1 2.43*   Plans:  - continue diuiresis - stop coumadin - follow troponins, consider heparin gtt depending on trend - start heparin sq fro DVT prophylaxis.  30 minutes CC time  Levy Pupa, MD, PhD 04/08/2013, 9:23 AM [facility] [phone] or if no answer [phone]

## 2013-04-08 NOTE — Progress Notes (Signed)
CRITICAL VALUE ALERT  Critical value received:  Troponin 2.41  Date of notification:  04/08/13  Time of notification:  1233  Critical value read back:yes  Nurse who received alert:  Ulis Rias, RN  MD notified (1st page):  Dr. Tyson Alias  Time of first page:  1235  MD notified (2nd page):  Time of second page:  Responding MD:  Dr. Tyson Alias   Time MD responded:  1245

## 2013-04-08 NOTE — ED Notes (Signed)
Family arrives to [facility], pt tolerating BiPap. Dr.sheldon at [facility].

## 2013-04-08 NOTE — Progress Notes (Signed)
ABG resulted in critical values. PH 7.154, PaCO2 65.6. RBV to Bernette Mayers, MD at 0209.

## 2013-04-08 NOTE — ED Notes (Addendum)
Dr. Erline Hau PCCM at [facility]. Family has left the ED.

## 2013-04-09 ENCOUNTER — Inpatient Hospital Stay (HOSPITAL_COMMUNITY): Payer: Medicare Other

## 2013-04-09 DIAGNOSIS — J810 Acute pulmonary edema: Secondary | ICD-10-CM

## 2013-04-09 LAB — BASIC METABOLIC PANEL
BUN: 47 mg/dL — ABNORMAL HIGH (ref 6–23)
Creatinine, Ser: 2.55 mg/dL — ABNORMAL HIGH (ref 0.50–1.10)
GFR calc Af Amer: 19 mL/min — ABNORMAL LOW (ref >90)
GFR calc non Af Amer: 17 mL/min — ABNORMAL LOW (ref >90)

## 2013-04-09 LAB — CBC
MCH: 30.2 pg (ref 26.0–34.0)
MCV: 88.4 fL (ref 78.0–100.0)
Platelets: 252 10*3/uL (ref 150–400)
RDW: 13.6 % (ref 11.5–15.5)

## 2013-04-09 LAB — LEGIONELLA ANTIGEN, URINE

## 2013-04-09 LAB — PROCALCITONIN: Procalcitonin: <0.1 ng/mL

## 2013-04-09 MED ORDER — POTASSIUM CHLORIDE 20 MEQ/15ML (10%) PO LIQD
20.0000 meq | Freq: Once | ORAL | Status: AC
  Administered 2013-04-09: 20 meq
  Filled 2013-04-09: qty 15

## 2013-04-09 MED ORDER — POTASSIUM CHLORIDE 20 MEQ/15ML (10%) PO LIQD
40.0000 meq | Freq: Once | ORAL | Status: AC
  Administered 2013-04-09: 40 meq
  Filled 2013-04-09: qty 30

## 2013-04-09 MED ORDER — POTASSIUM CHLORIDE 20 MEQ/15ML (10%) PO LIQD
ORAL | Status: AC
  Filled 2013-04-09: qty 30

## 2013-04-09 MED ORDER — POTASSIUM CHLORIDE 10 MEQ/100ML IV SOLN: 10.0000 meq | INTRAVENOUS | Status: DC

## 2013-04-09 NOTE — Progress Notes (Signed)
    Subjective:  Intubated but wide awake. No chest pain. Uncomfortable with ET tube.  Objective:  Vital Signs in the last 24 hours: Temp:  [97.5 F (36.4 C)-98 F (36.7 C)] 97.6 F (36.4 C) (05/13 0722) Pulse Rate:  [46-88] 56 (05/13 0700) Resp:  [16-22] 16 (05/13 0700) BP: (129-169)/(37-98) 136/42 mmHg (05/13 0700) SpO2:  [100 %] 100 % (05/13 0700) FiO2 (%):  [40 %-41.6 %] 41.3 % (05/13 0700) Weight:  [58.2 kg (128 lb 4.9 oz)] 58.2 kg (128 lb 4.9 oz) (05/13 0500)  Intake/Output from previous day: 05/12 0701 - 05/13 0700 In: 1058.3 [I.V.:578.3; NG/GT:230; IV Piggyback:250] Out: 3005 [Urine:2930; Emesis/NG output:75]  Physical Exam: Pt is alert, intubated, elderly woman HEENT: normal Neck: JVP - normal Lungs: CTA bilaterally CV: RRR with grade 2/6 systolic murmur at the LSB Abd: soft, NT, Positive BS, no hepatomegaly Ext: no C/C/E, distal pulses intact and equal Skin: diffuse rash unchanged  Lab Results:  Recent Labs  04/08/13 1104 04/09/13 0345  WBC 13.6* 13.6*  HGB 10.9* 11.2*  PLT 240 252    Recent Labs  04/08/13 1104 04/09/13 0345  NA 135 138  K 3.6 3.2*  CL 100 99  CO2 23 25  GLUCOSE 140* 111*  BUN 40* 47*  CREATININE 2.15* 2.55*    Recent Labs  04/08/13 1619 04/08/13 2224  TROPONINI 2.84* 2.12*    Tele: Sinus rhythm, short run of NSVT, personally reviewed.  Assessment/Plan:  1. Acute respiratory failure. Suspect acute pulmonary edema. I just saw her Friday in the office and she was stable from CV perspective. In past, pulmonary edema has been associated with AF with RVR. I think she has very severe diastolic dysfunction and it takes very little to cause acute heart failure. I wrote her for a medrol dose-pak for her drug rash and this may have caused excess volume. She is improved this am and I suspect will be able to extubate. Continue IV lasix this am dose but would change back to oral lasix after that as she has a GFR less than 30.   2.  PAF. Now off of warfarin. Would not restart. Anticoagulation options very limited in this elderly patient with advanced kidney disease. Will discuss low-dose Eliquis with pharmacy, but may be best to just treat with aspirin. Continue beta-blocker. Fortunately maintaining sinus rhythm.  3. NSTEMI, suspect Type 2 with flat low-level enzyme trend.  Will follow with you. Cecille Po 04/09/2013 7:35 AM    Tonny Bollman, M.D. 04/09/2013, 7:28 AM

## 2013-04-09 NOTE — Progress Notes (Addendum)
PULMONARY  / CRITICAL CARE MEDICINE  Name: [name] MRN: [id number] DOB: Jun 09, 1930    ADMISSION DATE:  04/08/2013  PRIMARY SERVICE: PCCM  CHIEF COMPLAINT:  SOB  BRIEF PATIENT DESCRIPTION:  79 years old with PMH relevant for aortic insufficiency and MR, CHF with LVEF of 35%, A. Fib on coumadin, LBBB, CKD. Presents with two days history of SOB. In the ED found hypoxic and in severe respiratory distress. Consistent with acute diastolic CHF, ? Due to A Fib + RVR. Failed BiPAP and required intubation.  LINES / TUBES: Peripheral IV's  CULTURES: Blood 5/12 >>  Urine 5/12 >>  Tracheal aspirate 5/12 >>   ANTIBIOTICS: Zosyn 5/12 >> 5/13 Vancomycin 5/12 >> 5/13  SUBJECTIVE:  Tolerating PSV 5 I/O > Negative 2.3L since admission  VITAL SIGNS: Temp:  [97.5 F (36.4 C)-98 F (36.7 C)] 97.6 F (36.4 C) (05/13 0722) Pulse Rate:  [46-88] 67 (05/13 0748) Resp:  [12-22] 12 (05/13 0748) BP: (129-169)/(37-98) 136/42 mmHg (05/13 0748) SpO2:  [100 %] 100 % (05/13 0748) FiO2 (%):  [40 %-41.6 %] 40 % (05/13 0748) Weight:  [58.2 kg (128 lb 4.9 oz)] 58.2 kg (128 lb 4.9 oz) (05/13 0500) HEMODYNAMICS:   VENTILATOR SETTINGS: Vent Mode:  [-] CPAP FiO2 (%):  [40 %-41.6 %] 40 % Set Rate:  [16 bmp] 16 bmp Vt Set:  [500 mL] 500 mL PEEP:  [5 cmH20] 5 cmH20 Pressure Support:  [5 cmH20] 5 cmH20 Plateau Pressure:  [14 cmH20-21 cmH20] 14 cmH20 INTAKE / OUTPUT: Intake/Output     05/12 0701 - 05/13 0700 05/13 0701 - 05/14 0700   I.V. (mL/kg) 604.3 (10.4)    NG/GT 230 30   IV Piggyback 250    Total Intake(mL/kg) 1084.3 (18.6) 30 (0.5)   Urine (mL/kg/hr) 3030 (2.2)    Emesis/NG output 75 (0.1)    Total Output 3105     Net -2020.7 +30          PHYSICAL EXAMINATION: General: No apparent distress. Intubated Eyes: Anicteric sclerae. ENT: ETT in place, trache at midline. Lymph: No cervical, supraclavicular, or axillary lymphadenopathy. Heart: Systolic and diastolic murmur appreciated. No  bruits, equal pulses. Lungs: Bilateral crackles more pronounced to the bases. No Wheezing.  Abdomen: Abdomen soft, non-tender and not distended, normoactive bowel sounds. No hepatosplenomegaly or masses. Musculoskeletal: No clubbing or synovitis. Skin: Has patchy erythematous plaques in her trunk and extremities. Neuro: Awake, nods to questions, moves all ext  LABS:  Recent Labs Lab 04/08/13 0142 04/08/13 0143 04/08/13 0206 04/08/13 0222  04/08/13 0504 04/08/13 1104 04/08/13 1120 04/08/13 1619 04/08/13 2224 04/09/13 0345  HGB 11.0*  --   --  11.6*  --   --  10.9*  --   --   --  11.2*  WBC 23.2*  --   --   --   --   --  13.6*  --   --   --  13.6*  PLT 323  --   --   --   --   --  240  --   --   --  252  NA 132*  --   --  135  --   --  135  --   --   --  138  K 3.9  --   --  4.0  --   --  3.6  --   --   --  3.2*  CL 100  --   --  106  --   --  100  --   --   --  99  CO2 20  --   --   --   --   --  23  --   --   --  25  GLUCOSE 178*  --   --  178*  --   --  140*  --   --   --  111*  BUN 37*  --   --  40*  --   --  40*  --   --   --  47*  CREATININE 2.06*  --   --  1.80*  --   --  2.15*  --   --   --  2.55*  CALCIUM 8.7  --   --   --   --   --  8.8  --   --   --  8.7  MG  --   --   --   --   --   --  2.0  --   --   --  2.0  PHOS  --   --   --   --   --   --  3.6  --   --   --  4.8*  AST 34  --   --   --   --   --  38*  --   --   --   --   ALT 21  --   --   --   --   --  21  --   --   --   --   ALKPHOS 50  --   --   --   --   --  46  --   --   --   --   BILITOT 0.3  --   --   --   --   --  0.4  --   --   --   --   PROT 7.1  --   --   --   --   --  6.5  --   --   --   --   ALBUMIN 3.8  --   --   --   --   --  3.7  --   --   --   --   APTT 34  --   --   --   --   --   --   --   --   --   --   INR 2.43*  --   --   --   --   --   --   --   --   --   --   LATICACIDVEN  --   --   --  1.07  --   --  1.9  --   --   --   --   TROPONINI  --   --   --   --   < >  --   --  2.41* 2.84* 2.12*   --   PROBNP  --  11286.0*  --   --   --   --   --   --   --   --   --   PHART  --   --  7.154*  --   --  7.229*  --   --   --   --   --   PCO2ART  --   --  65.6*  --   --  56.2*  --   --   --   --   --   PO2ART  --   --  119.0*  --   --  193.0*  --   --   --   --   --   < > = values in this interval not displayed.  Recent Labs Lab 04/08/13 1932  GLUCAP 116*    CXR:  5/13 >> improvement in bilateral infiltrates but persist,  more pronounced to the bases.   ASSESSMENT / PLAN:  PULMONARY A: 1) Acute hypoxemic and hypercarbic respiratory failure 2) Pulmonary edema is the most likely cause of her respiratory failure; she has proven to be labile in the past, esp with A Fib + RVR Consider HCAP given WBC of 23 and history of recent admission. P:   - WUA + SBT, goal extubate 5/13; T-piece before extubation to assess for acute pulm edema - VAP prevention order set - abx for ? HCAP as below  CARDIOVASCULAR A:  1) Acute pulmonary edema 2) Atrial fibrillation on coumadin; now sinus brady 3) Acute on Chronic systolic and diastolic heart failure with LVEF of 35% P:  - Lasix 40 mg IV this am, further dosing depending on I/O and clinical status; goal back to baseline Po dosing - bisoprolol 5mg  qd - coumadin stopped 5/12 due to rash; appreciate Dr Earmon Phoenix help, may be a candidate for low dose eliquis at some point vs ASA 81. For now will hold off.   RENAL A: Acute on Chronic renal insufficiency (S Cr 2.29 in 4/14) 1)  P:   - follow S Cr, adjust diuresis and other meds based on renal fxn  GASTROINTESTINAL A:   1) No issues P:   - GI prophylaxis with protonix  HEMATOLOGIC A:   1) No issues P:  - heparin for DVT prophylaxis - Will follow CBC  INFECTIOUS A:   1) Questionable HCAP P:   - empiric Zosyn and vancomycin. Plan discontinue 5/13 given quick clinical and radiological improvement with diuresis.  ENDOCRINE A:   1) No issues    NEUROLOGIC A:   1) Sedation P:    - WUA   DERMATOLOGICAL 1) erythematous macular rash, whole body. ? Related to coumadin (based on the timing of onset) P: - coumadin stopped - will follow, investigate for other possible causes if not resolving  I have personally obtained a history, examined the patient, evaluated laboratory and imaging results, formulated the assessment and plan and placed orders.  CRITICAL CARE: Critical Care Time devoted to patient care services described in this note is 40 minutes.   Levy Pupa, MD, PhD 04/09/2013, 8:43 AM [facility] [phone] or if no answer [phone]

## 2013-04-09 NOTE — Procedures (Signed)
Extubation Procedure Note  Patient Details:   Name: [name] DOB: Jul 07, 1930 MRN: [id number]   Airway Documentation:     Evaluation  O2 sats: stable throughout Complications: No apparent complications Patient did tolerate procedure well. Bilateral Breath Sounds: Clear;Diminished Suctioning: Airway Yes pt able to vocalize.   Pt extubated at this time per MD order and tolerated well. Pt able to breathe around deflated cuff. Pt placed on 3L , VS stable. No complications noted. No stridor noted. Pt has strong, adequate cough. RT will continue to monitor.   Loyal Jacobson [facility] 04/09/2013, 9:43 AM

## 2013-04-09 NOTE — Progress Notes (Signed)
[facility] ADULT ICU REPLACEMENT PROTOCOL FOR AM LAB REPLACEMENT ONLY  The patient does not apply for the [facility] Adult ICU Electrolyte Replacment Protocol based on the criteria listed below:   1. Is GFR >/= 40 ml/min? no  Patient's GFR today is 17 2. Is urine output >/= 0.5 ml/kg/hr for the last 6 hours? no Patient's UOP is  ml/kg/hr 3. Is BUN < 60 mg/dL? no  Patient's BUN today is 47 4. Abnormal electrolyte(s):K 3.2 5. Ordered repletion with:  6. If a panic level lab has been reported, has the CCM MD in charge been notified? yes.   Physician:  Dr Salvadore Farber, Lekha Dancer A 04/09/2013 4:39 AM

## 2013-04-09 NOTE — Progress Notes (Signed)
Hypokalemia   K replaced  

## 2013-04-09 NOTE — Progress Notes (Signed)
45cc versed and 150cc fentanyl wasted in sink, flushed.  Witnessed by Edilia Bo, RN.

## 2013-04-09 NOTE — Progress Notes (Signed)
Utilization review completed. Gared Gillie, RN, BSN. 

## 2013-04-09 NOTE — Progress Notes (Signed)
Intermittent agitation; pulling on lines; interfering with medical treatment.   Restrains ordered.

## 2013-04-10 ENCOUNTER — Inpatient Hospital Stay (HOSPITAL_COMMUNITY): Payer: Medicare Other

## 2013-04-10 LAB — CBC
MCH: 29.8 pg (ref 26.0–34.0)
MCV: 89.6 fL (ref 78.0–100.0)
Platelets: 278 10*3/uL (ref 150–400)
RDW: 13.6 % (ref 11.5–15.5)
WBC: 11.3 10*3/uL — ABNORMAL HIGH (ref 4.0–10.5)

## 2013-04-10 LAB — CULTURE, RESPIRATORY W GRAM STAIN

## 2013-04-10 LAB — URINE CULTURE: Colony Count: 50000

## 2013-04-10 LAB — PRO B NATRIURETIC PEPTIDE: Pro B Natriuretic peptide (BNP): 4507 pg/mL — ABNORMAL HIGH (ref 0–450)

## 2013-04-10 LAB — BASIC METABOLIC PANEL
BUN: 51 mg/dL — ABNORMAL HIGH (ref 6–23)
GFR calc Af Amer: 19 mL/min — ABNORMAL LOW (ref >90)
GFR calc non Af Amer: 17 mL/min — ABNORMAL LOW (ref >90)
Potassium: 3.9 mEq/L (ref 3.5–5.1)

## 2013-04-10 LAB — MAGNESIUM: Magnesium: 2 mg/dL (ref 1.5–2.5)

## 2013-04-10 MED ORDER — PANTOPRAZOLE SODIUM 40 MG PO TBEC
40.0000 mg | DELAYED_RELEASE_TABLET | Freq: Every day | ORAL | Status: DC
  Administered 2013-04-11: 40 mg via ORAL
  Filled 2013-04-10: qty 1

## 2013-04-10 MED ORDER — FUROSEMIDE 40 MG PO TABS
40.0000 mg | ORAL_TABLET | Freq: Every day | ORAL | Status: DC
  Administered 2013-04-10 – 2013-04-11 (×2): 40 mg via ORAL
  Filled 2013-04-10 (×2): qty 1

## 2013-04-10 MED ORDER — CLOPIDOGREL BISULFATE 75 MG PO TABS
75.0000 mg | ORAL_TABLET | Freq: Every day | ORAL | Status: DC
  Administered 2013-04-11: 75 mg via ORAL
  Filled 2013-04-10 (×2): qty 1

## 2013-04-10 NOTE — Progress Notes (Signed)
Pt t/x to 2040 in wheelchair on monitor without event. Pt denied request to notify family of room change. Pt's dentures in her mouth at time of t/x as well as her glasses. Pt hooked up to telemetry before my departure and Technical brewer aware of arrival   [name]

## 2013-04-10 NOTE — Progress Notes (Signed)
In to see patient at this time, patient at this time has had a run of 15 beats of NSVT. The patient is currently sleeping with no signs or symptoms of resp distress. Currently patient VS 130/67 80 HR NSR, 98% 2L [place]. MD Al[place] notified at this time. Will continue to monitor the patient closely.

## 2013-04-10 NOTE — Progress Notes (Signed)
Non sustained VTach    Previous 2Decho with lipomatous atrial infiltration and septal lateral dyssynchrony, Moderate calcified mitral valve and aortic valve.    Plan: Obtain EKG and cont to monitor cardiac enzymes.

## 2013-04-10 NOTE — Evaluation (Signed)
Physical Therapy Evaluation Patient Details Name: [name] MRN: [id number] DOB: 01-22-1930 Today's Date: 04/10/2013 Time: 4098-1191 PT Time Calculation (min): 33 min  PT Assessment / Plan / Recommendation Clinical Impression  Pt s/p MI with VDRF with decr mobility secondary to decr endurance, dizziness and decr balance.  Will benefit from PT to adddress mobility issues.  Pt aware that she may need to use RW or cane when she first goes home.      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    Equipment Recommendations  None recommended by PT         Frequency Min 3X/week    Precautions / Restrictions Precautions Precautions: Fall Restrictions Weight Bearing Restrictions: No   Pertinent Vitals/Pain VSS with slight orthostatic change with position changes.  No pain      Mobility  Bed Mobility Bed Mobility: Not assessed Transfers Transfers: Sit to Stand;Stand to Sit Sit to Stand: 4: Min guard;With upper extremity assist;With armrests;From chair/3-in-1 Stand to Sit: 4: Min guard;With upper extremity assist;With armrests;To chair/3-in-1 Details for Transfer Assistance: Needed steadying assist upon initial stand Ambulation/Gait Ambulation/Gait Assistance: 4: Min assist;4: Min guard Ambulation Distance (Feet): 120 Feet Assistive device: None Ambulation/Gait Assistance Details: Pt with occasional staggering gait but self corrected without physical assist by PT.  Pt has cane and RW at home and is aware that she may need to use them initially for balance. Pt also c/o dizziness with ambulation therefore returned to room and took BP which was a little orthostatic with position changes.   Gait Pattern: Step-through pattern;Narrow base of support;Decreased step length - left;Decreased step length -  right;Decreased stride length Gait velocity: decreased Stairs: No Wheelchair Mobility Wheelchair Mobility: No         PT Diagnosis: Generalized weakness  PT Problem List: Decreased activity tolerance;Decreased balance;Decreased mobility;Decreased knowledge of use of DME;Decreased safety awareness;Decreased knowledge of precautions PT Treatment Interventions: DME instruction;Gait training;Stair training;Functional mobility training;Therapeutic activities;Therapeutic exercise;Balance training   PT Goals Acute Rehab PT Goals PT Goal Formulation: With patient Time For Goal Achievement: 04/17/13 Potential to Achieve Goals: Good Pt will go Supine/Side to Sit: Independently PT Goal: Supine/Side to Sit - Progress: Goal set today Pt will go Sit to Stand: Independently PT Goal: Sit to Stand - Progress: Goal set today Pt will Ambulate: 51 - 150 feet;with modified independence;with least restrictive assistive device PT Goal: Ambulate - Progress: Goal set today Pt will Go Up / Down Stairs: Flight;with supervision;with least restrictive assistive device PT Goal: Up/Down Stairs - Progress: Goal set today  Visit Information  Last PT Received On: 04/10/13 Assistance Needed: +1    Subjective Data  Subjective: "I feel dizzy," pt stated when ambulating. Patient Stated Goal: to go home   Prior Functioning  Home Living Lives With: Other (Comment) (Great grandson legally blind lives with pt) Available Help at Discharge: Available 24 hours/day;Other (Comment) (daughter can take leave from work) Type of Home: House Home Access: Stairs to enter [employer] of Steps: 3 Entrance Stairs-Rails: None Home Layout: Two level;Laundry or work area in basement;Able to live on main level with bedroom/bathroom (freezer in basement) Alternate Level Stairs-Number of Steps: flight Alternate Level Stairs-Rails: Right Bathroom Shower/Tub: Engineer, manufacturing systems: Standard Home Adaptive  Equipment: Tub transfer bench;Bedside commode/3-in-1;Walker - rolling;Straight cane;Hand-held shower hose;Wheelchair -  manual Additional Comments: 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  Trying to get SCAT arranged for her grandson. Prior Function Level of Independence: Independent Able to Take Stairs?: Yes Driving: Yes Vocation: Retired Musician: No difficulties Dominant Hand: Right    Cognition  Cognition Arousal/Alertness: Awake/alert Behavior During Therapy: WFL for tasks assessed/performed Overall Cognitive Status: Within Functional Limits for tasks assessed    Extremity/Trunk Assessment Right Lower Extremity Assessment RLE ROM/Strength/Tone: [facility] for tasks assessed Left Lower Extremity Assessment LLE ROM/Strength/Tone: [facility] for tasks assessed   Balance Static Standing Balance Static Standing - Balance Support: During functional activity;No upper extremity supported Static Standing - Level of Assistance: 4: Min assist Static Standing - Comment/# of Minutes: steadying assist needed for balance in static stance.  End of Session PT - End of Session Equipment Utilized During Treatment: Gait belt Activity Tolerance: Patient tolerated treatment well Patient left: in chair;with call bell/phone within reach;with nursing in room Nurse Communication: Mobility status       INGOLD,Estephani Popper 04/10/2013, 1:39 PM [facility] Acute Rehabilitation [phone] (pager)

## 2013-04-10 NOTE — Progress Notes (Signed)
    Subjective:  Feeling better today. No cp or dyspnea.   Objective:  Vital Signs in the last 24 hours: Temp:  [97.4 F (36.3 C)-98.2 F (36.8 C)] 97.9 F (36.6 C) (05/14 0700) Pulse Rate:  [54-81] 81 (05/14 0900) Resp:  [7-22] 22 (05/14 0900) BP: (112-159)/(35-106) 152/58 mmHg (05/14 0900) SpO2:  [91 %-100 %] 96 % (05/14 0900) FiO2 (%):  [21 %] 21 % (05/13 2020) Weight:  [57.2 kg (126 lb 1.7 oz)] 57.2 kg (126 lb 1.7 oz) (05/14 0600)  Intake/Output from previous day: 05/13 0701 - 05/14 0700 In: 922.5 [P.O.:600; I.V.:242.5; NG/GT:80] Out: 4730 [Urine:4580; Emesis/NG output:150]  Physical Exam: Pt is alert and oriented, pleasant elderly woman in NAD HEENT: normal Neck: JVP - normal Lungs: CTA bilaterally CV: RRR with grade 2/6 systolic murmur at LSB Abd: soft, NT, Positive BS, no hepatomegaly Ext: no C/C/E, distal pulses intact and equal Skin: diffuse urticarial rash  Lab Results:  Recent Labs  04/09/13 0345 04/10/13 0401  WBC 13.6* 11.3*  HGB 11.2* 11.8*  PLT 252 278    Recent Labs  04/09/13 0345 04/10/13 0401  NA 138 139  K 3.2* 3.9  CL 99 100  CO2 25 29  GLUCOSE 111* 94  BUN 47* 51*  CREATININE 2.55* 2.52*    Recent Labs  04/08/13 2224 04/10/13 0319  TROPONINI 2.12* 2.40*    Cardiac Studies: 2D Echo 03/12/2013: Study Conclusions  - Left ventricle: The cavity size was normal. There appears to be septal-lateral dyssynchrony. Systolic function was normal. The estimated ejection fraction was in the range of 55% to 60%. - Aortic valve: Trileaflet; moderately calcified leaflets. - Mitral valve: Moderately calcified annulus. - Left atrium: The atrium was mildly dilated. - Right ventricle: The cavity size was normal. Systolic function was normal. - Right atrium: Eustachian valve noted. - Atrial septum: Lipomatous atrial septal hypertrophy. Impressions:  - Limited echo for RA mass. Prominent Eustachian valve noted. Also lipomatous atrial  septal hypertrophy. No RA mass.  Tele: Sinus rhythm, few episodes of NSVT  Assessment/Plan:  1. Acute respiratory failure secondary to cardiogenic pulmonary edema 2. Acute on chronic diastolic CHF 3. CAD with Type 2 NSTEMI, demand ischemia suspected 4. CKD Stage 4 5. Allergic rash - likely coumadin 6. Paroxysmal atrial fibrillation  Pt has stabilized with IV diuresis and creatinine stable with low GFR 17. Recommend transfer to tele today. I'm happy to take on my service since this appears to be cardiogenic in nature. Now that we think she is coumadin-allergic, I think we are better off resuming ASA and plavix. Ideally she would be anticoagulated, but with a coumadin allergy and very low creatinine clearance, I think best to avoid novel agents. She has previously tolerated ASA and plavix well. Will write for furosemide 40 mg daily. Appreciate CCM care. Their notes reviewed - will d/c antibiotics.  Tonny Bollman, M.D. 04/10/2013, 9:11 AM

## 2013-04-10 NOTE — Progress Notes (Signed)
PULMONARY  / CRITICAL CARE MEDICINE  Name: [name] MRN: [id number] DOB: [birth year]    ADMISSION DATE:  04/08/2013  PRIMARY SERVICE: PCCM  CHIEF COMPLAINT:  SOB  BRIEF PATIENT DESCRIPTION:  79 years old with PMH relevant for aortic insufficiency and MR, CHF with LVEF of 35%, A. Fib on coumadin, LBBB, CKD. Presents with two days history of SOB. In the ED found hypoxic and in severe respiratory distress. Consistent with acute diastolic CHF, ? Due to A Fib + RVR. Failed BiPAP and required intubation.  LINES / TUBES: Peripheral IV's ETT 5/12 >> 5/13   CULTURES: Blood 5/12 >>  Urine 5/12 >>  Tracheal aspirate 5/12 >>   ANTIBIOTICS: Zosyn 5/12 >> 5/13 Vancomycin 5/12 >> 5/13  SUBJECTIVE:  Tolerated extubation   VITAL SIGNS: Temp:  [97.4 F (36.3 C)-98.2 F (36.8 C)] 97.9 F (36.6 C) (05/14 0700) Pulse Rate:  [54-81] 81 (05/14 0900) Resp:  [7-22] 22 (05/14 0900) BP: (112-159)/(35-106) 152/58 mmHg (05/14 0900) SpO2:  [91 %-100 %] 96 % (05/14 0900) FiO2 (%):  [21 %] 21 % (05/13 2020) Weight:  [57.2 kg (126 lb 1.7 oz)] 57.2 kg (126 lb 1.7 oz) (05/14 0600) HEMODYNAMICS:   VENTILATOR SETTINGS: Vent Mode:  [-]  FiO2 (%):  [21 %] 21 % INTAKE / OUTPUT: Intake/Output     05/13 0701 - 05/14 0700 05/14 0701 - 05/15 0700   P.O. 600    I.V. (mL/kg) 242.5 (4.2)    NG/GT 80    IV Piggyback     Total Intake(mL/kg) 922.5 (16.1)    Urine (mL/kg/hr) 4580 (3.3) 40 (0.2)   Emesis/NG output 150 (0.1)    Total Output 4730 40   Net -3807.5 -40          PHYSICAL EXAMINATION: General: No apparent distress. Up to chair Eyes: Anicteric sclerae. ENT: Op clear, moist Lymph: No cervical, supraclavicular, or axillary lymphadenopathy. Heart: Systolic and diastolic murmur appreciated. No bruits, equal pulses. Lungs: fine basilar crackles Abdomen: Abdomen soft, non-tender and not distended, normoactive bowel sounds. No hepatosplenomegaly or masses. Musculoskeletal: No clubbing or  synovitis. Skin: Has patchy erythematous plaques in her trunk and extremities > appears to be improving Neuro: Awake, non-focal  LABS:  Recent Labs Lab 04/08/13 0142 04/08/13 0143 04/08/13 0206 04/08/13 0222  04/08/13 0504 04/08/13 1104  04/08/13 1619 04/08/13 2224 04/09/13 0345 04/10/13 0319 04/10/13 0400 04/10/13 0401  HGB 11.0*  --   --  11.6*  --   --  10.9*  --   --   --  11.2*  --   --  11.8*  WBC 23.2*  --   --   --   --   --  13.6*  --   --   --  13.6*  --   --  11.3*  PLT 323  --   --   --   --   --  240  --   --   --  252  --   --  278  NA 132*  --   --  135  --   --  135  --   --   --  138  --   --  139  K 3.9  --   --  4.0  --   --  3.6  --   --   --  3.2*  --   --  3.9  CL 100  --   --  106  --   --  100  --   --   --  99  --   --  100  CO2 20  --   --   --   --   --  23  --   --   --  25  --   --  29  GLUCOSE 178*  --   --  178*  --   --  140*  --   --   --  111*  --   --  94  BUN 37*  --   --  40*  --   --  40*  --   --   --  47*  --   --  51*  CREATININE 2.06*  --   --  1.80*  --   --  2.15*  --   --   --  2.55*  --   --  2.52*  CALCIUM 8.7  --   --   --   --   --  8.8  --   --   --  8.7  --   --  9.2  MG  --   --   --   --   --   --  2.0  --   --   --  2.0  --   --  2.0  PHOS  --   --   --   --   --   --  3.6  --   --   --  4.8*  --   --  3.3  AST 34  --   --   --   --   --  38*  --   --   --   --   --   --   --   ALT 21  --   --   --   --   --  21  --   --   --   --   --   --   --   ALKPHOS 50  --   --   --   --   --  46  --   --   --   --   --   --   --   BILITOT 0.3  --   --   --   --   --  0.4  --   --   --   --   --   --   --   PROT 7.1  --   --   --   --   --  6.5  --   --   --   --   --   --   --   ALBUMIN 3.8  --   --   --   --   --  3.7  --   --   --   --   --   --   --   APTT 34  --   --   --   --   --   --   --   --   --   --   --   --   --   INR 2.43*  --   --   --   --   --   --   --   --   --   --   --   --   --   LATICACIDVEN  --   --   --  1.07  --   --  1.9  --   --   --   --   --   --   --   TROPONINI  --   --   --   --   < >  --   --   < > 2.84* 2.12*  --  2.40*  --   --   PROCALCITON  --   --   --   --   --   --   --   --   --   --  <0.10  --  <0.10  --   PROBNP  --  11286.0*  --   --   --   --   --   --   --   --   --   --  4507.0*  --   PHART  --   --  7.154*  --   --  7.229*  --   --   --   --   --   --   --   --   PCO2ART  --   --  65.6*  --   --  56.2*  --   --   --   --   --   --   --   --   PO2ART  --   --  119.0*  --   --  193.0*  --   --   --   --   --   --   --   --   < > = values in this interval not displayed.  Recent Labs Lab 04/08/13 1932  GLUCAP 116*    CXR:  5/13 >> improvement in bilateral infiltrates but persist,  more pronounced to the bases.   ASSESSMENT / PLAN:  PULMONARY A: 1) Acute hypoxemic and hypercarbic respiratory failure 2) Pulmonary edema; she has proven to be labile in the past, esp with A Fib + RVR 3) Possible HCAP given WBC of 23 and history of recent admission. Now doubt given the clinical course P:   - pulm toilet  CARDIOVASCULAR A:  1) Acute pulmonary edema 2) Atrial fibrillation on coumadin; now sinus brady 3) Acute on Chronic systolic and diastolic heart failure with LVEF of 35% P:  - Lasix dosing as planned by Dr Excell Seltzer - bisoprolol 5mg  qd - coumadin stopped 5/12 due to rash; appreciate Dr Earmon Phoenix help, may be a candidate for low dose eliquis at some point vs ASA 81.   RENAL A: Acute on Chronic renal insufficiency (S Cr 2.29 in 4/14) 1)  P:   - follow S Cr, adjust diuresis and other meds based on renal fxn  GASTROINTESTINAL A:   1) No issues P:   - GI prophylaxis with protonix  HEMATOLOGIC A:   1) No issues P:  - heparin for DVT prophylaxis - follow CBC  INFECTIOUS A:   1) Questionable HCAP on presentation, now doubt P:   - Zosyn and vancomycin stopped  ENDOCRINE A:   1) No issues    NEUROLOGIC A:   1) Sedation P:   - WUA    DERMATOLOGICAL 1) erythematous macular rash, whole body. ? Related to coumadin or diltiazem (based on the timing of onset) P: - coumadin and dilt stopped - investigate for other possible causes if not resolving  I have personally obtained a history, examined the patient, evaluated laboratory and imaging results, formulated the assessment and plan and placed orders.  Appreciate  Dr Cooper's care. Will transfer to telemetry, change her over to his service 5/14. Please call if we can help in any way.   Levy Pupa, MD, PhD 04/10/2013, 10:40 AM [facility] [phone] or if no answer [phone]

## 2013-04-11 ENCOUNTER — Other Ambulatory Visit: Payer: Self-pay | Admitting: Physician Assistant

## 2013-04-11 ENCOUNTER — Encounter (HOSPITAL_COMMUNITY): Payer: Self-pay | Admitting: Physician Assistant

## 2013-04-11 DIAGNOSIS — N184 Chronic kidney disease, stage 4 (severe): Secondary | ICD-10-CM

## 2013-04-11 LAB — BASIC METABOLIC PANEL
BUN: 51 mg/dL — ABNORMAL HIGH (ref 6–23)
Calcium: 9.2 mg/dL (ref 8.4–10.5)
GFR calc non Af Amer: 16 mL/min — ABNORMAL LOW (ref >90)
Glucose, Bld: 92 mg/dL (ref 70–99)

## 2013-04-11 MED ORDER — LORATADINE 10 MG PO TABS: 10.0000 mg | ORAL_TABLET | ORAL | Status: DC | PRN

## 2013-04-11 MED ORDER — CLOPIDOGREL BISULFATE 75 MG PO TABS: 75.0000 mg | ORAL_TABLET | Freq: Every day | ORAL | Status: AC

## 2013-04-11 MED ORDER — BISOPROLOL FUMARATE 5 MG PO TABS: 5.0000 mg | ORAL_TABLET | Freq: Every day | ORAL | Status: DC

## 2013-04-11 MED ORDER — FUROSEMIDE 40 MG PO TABS: 40.0000 mg | ORAL_TABLET | Freq: Every day | ORAL | Status: DC

## 2013-04-11 NOTE — Progress Notes (Signed)
NUTRITION FOLLOW UP  Intervention:    No nutrition intervention warranted at this time RD to continue to follow  Nutrition Dx:   Inadequate oral intake, resolved  New Goal:   Oral intake with meals to meet >/= 90% of estimated nutrition needs, met  Monitor:   PO intake, weight, labs, I/O's  Assessment:   Patient extubated 5/13.  Transferred from SICU to 2000.  Diet advanced to solids 5/13, 1747.  PO intake 75-100% per flowsheet records.  Height: Ht Readings from Last 1 Encounters:  04/08/13 5\' 1"  (1.549 m)    Weight Status:   Wt Readings from Last 1 Encounters:  04/11/13 124 lb 12.5 oz (56.6 kg)    Re-estimated needs:  Kcal: 1200-1400 Protein: 85-95 gm Fluid: >/= 1.5 L  Skin: skin tear to sacrum  Diet Order: Cardiac   Intake/Output Summary (Last 24 hours) at 04/11/13 1148 Last data filed at 04/11/13 0730  Gross per 24 hour  Intake    720 ml  Output    900 ml  Net   -180 ml    Last BM: 5/15  Labs:   Recent Labs Lab 04/08/13 1104 04/09/13 0345 04/10/13 0401 04/11/13 0345  NA 135 138 139 138  K 3.6 3.2* 3.9 4.1  CL 100 99 100 98  CO2 23 25 29 26   BUN 40* 47* 51* 51*  CREATININE 2.15* 2.55* 2.52* 2.58*  CALCIUM 8.8 8.7 9.2 9.2  MG 2.0 2.0 2.0  --   PHOS 3.6 4.8* 3.3  --   GLUCOSE 140* 111* 94 92    CBG (last 3)   Recent Labs  04/08/13 1932  GLUCAP 116*    Scheduled Meds: . aspirin  81 mg Per NG tube Daily  . bisoprolol  5 mg Oral Daily  . clopidogrel  75 mg Oral Q breakfast  . furosemide  40 mg Oral Daily  . heparin subcutaneous  5,000 Units Subcutaneous Q8H  . pantoprazole  40 mg Oral Daily    Continuous Infusions:   Maureen Chatters, RD, LDN Pager #: [phone] After-Hours Pager #: [phone]

## 2013-04-11 NOTE — Progress Notes (Signed)
Reviewed discharge summary with pt, reviewed extra CHF information.  Pt able to demonstrate learning.  Ambulated in hall without oxygen and sat was 95 after ambulation.  Discharge teaching complete.  Family here to take pt.  Barnett Hatter P

## 2013-04-11 NOTE — Discharge Summary (Signed)
Discharge Summary   Patient ID: ASA FATH MRN: [id number], DOB/AGE: [id number] 77 y.o. Admit date: 04/08/2013 D/C date:     04/11/2013  Primary Cardiologist: Excell Seltzer  Primary Discharge Diagnoses:  1. Acute respiratory failure secondary to cardiogenic pulmonary edema  2. Acute on chronic diastolic CHF  3. CAD with Type 2 NSTEMI, demand ischemia suspected  - prior history: NSTEMI 05/2012 s/p DES to OM (residual severe LCx subbranch dz for med rx unless refractory angina recurs) 4. CKD Stage 4  5. Allergic rash, likely coumadin - improving  6. Paroxysmal atrial fibrillation - dx 07/2012 and placed on amio but discontinued due to nausea, Coumadin started 02/2013 but discontinued this admission due to suspected allergic reaction   Secondary Discharge Diagnoses:  1. Valvular heart disease with mild AS, mild AI, mild MR by echo 11/2012 2. Bilateral LE edema 3. Labile HTN 4. Anxiety 5. Failure to thrive in childhood 6. H/o shingles 7. H/o psoriasis 8. H/o recurrent UTIs 9. Major depressive disorder in Apr 21, 2003 when husband died 3. Intermittent LBBB  Hospital Course:[name] is an 79 y/o female with h/o CAD, combined CHF, and PAF who presented to [facility]/08/13 with severe respiratory distress requiring intubation. History includes NSTEMI s/p PCI of the OM in 05/2013 with residual LCX branch disease for medical therapy unless refractory angina recurs, combined syst/diast CHF especially in the setting of PAF, with EF previously documented as low as 35% in 07/2012. Most recent EF was 55-60% in 02/2013, at which time she was hospitalized for recurrent afib and CHF. She had converted to sinus and was discharged home on bisoprolol and diltiazem along with coumadin. She was last seen in clinic on 5/9, at which time she was noted to have a diffuse rash, presumed to be from either warfarin or diltiazem. Diltiazem was discontinued with plan to DC warfarin if rash persisted. She was  prescribed a Medrol dose pack. On the morning of admission, she developed dyspnea and progressive respiratory failure, initially placed on CPAP then ventilator. WBC was 23k, but she was afebrile and this was in the setting of recent steroid use. INR was therapeutic. Initial troponin was mildly elevated and further rose to a peak of 2.84 but trend was relatively flat. pBNP was >11k and weight was up, reflecting acute on chronic diastolic CHF. She required IV diuresis for her CHF. Her NSTEMI was felt due to demand ischemia with plan for medical therapy, given risk of contrast nephropathy with CKD. Her rash was still present, raising the question of Coumadin allergy so this was discontinued.  It was suspected that Medrol may have lead to excess fluid retention. With ventilator support and diuresis, she improved and was able to be extubated the following day. Antibiotics were discontinued per PCCM recommendation. She continued to improve.   Regarding anticoagulation, Coumadin was permanently discontinued given her allergy. Ideally she would be anticoagulated but Dr. Excell Seltzer felt that with a Coumadin allergy and very low creatinine clearance, he preferred to avoid novel agents. She has tolerated ASA and Plavix well in the past thus this was instituted. This will also help medically treat her NSTEMI. IV lasix was transitioned to PO Lasix. Today she is feeling well and breathing is stable. She has been up out of bed without problems. Dr. Excell Seltzer has seen and examined her and feels she is stable for discharge. She was seen by PT and HHPT was recommended. She will follow up in our office 04/17/13 at 11:10am with BMET same  day. Med changes this admission include discontinuation of Imdur (can be restarted as outpt if needed), decreasing dose of bisoprolol to allow for pressure to diurese, addition of Lasix, addition of Plavix.  Discharge Vitals: Blood pressure 140/61, pulse 65, temperature 97 F (36.1 C), temperature source  Oral, resp. rate 18, height 5\' 1"  (1.549 m), weight 124 lb 12.5 oz (56.6 kg), SpO2 96.00%.  Labs: Lab Results  Component Value Date   WBC 11.3* 04/10/2013   HGB 11.8* 04/10/2013   HCT 35.5* 04/10/2013   MCV 89.6 04/10/2013   PLT 278 04/10/2013    Recent Labs Lab 04/08/13 1104  04/11/13 0345  NA 135  < > 138  K 3.6  < > 4.1  CL 100  < > 98  CO2 23  < > 26  BUN 40*  < > 51*  CREATININE 2.15*  < > 2.58*  CALCIUM 8.8  < > 9.2  PROT 6.5  --   --   BILITOT 0.4  --   --   ALKPHOS 46  --   --   ALT 21  --   --   AST 38*  --   --   GLUCOSE 140*  < > 92  < > = values in this interval not displayed.  Recent Labs  04/08/13 1619 04/08/13 2224 04/10/13 0319  TROPONINI 2.84* 2.12* 2.40*   Lab Results  Component Value Date   CHOL 223* 03/16/2013   HDL 59 03/16/2013   LDLCALC 140* 03/16/2013   TRIG 119 03/16/2013   Lab Results  Component Value Date   DDIMER 0.91* 08/10/2012    Diagnostic Studies/Procedures   Dg Chest Port 1 View 04/10/2013   *RADIOLOGY REPORT*  Clinical Data: Evaluate pulmonary edema  PORTABLE CHEST - 1 VIEW  Comparison: 04/09/2013  Findings: Heart size is normal.  There is been interval improvement in effusions and interstitial edema.  No airspace consolidation.  IMPRESSION:  1.  Improvement in CHF pattern.   Original Report Authenticated By: Signa Kell, M.D.   Dg Chest Port 1 View 04/09/2013   *RADIOLOGY REPORT*  Clinical Data: Evaluate endotracheal tube  PORTABLE CHEST - 1 VIEW  Comparison: 04/08/2013  Findings: ET tube tip is above the carina.  There is a nasogastric tube with side port below GE junction.  Heart size is normal. There are bilateral pleural effusions and interstitial edema consistent with CHF.  This is not significantly changed compared with previous exam.  IMPRESSION:  1.  No significant change in CHF pattern.   Original Report Authenticated By: Signa Kell, M.D.   Dg Chest Port 1 View 04/08/2013   *RADIOLOGY REPORT*  Clinical Data: Post  intubation  PORTABLE CHEST - 1 VIEW  Comparison: 04/08/2013  Findings: Endotracheal tube tip projects 6 cm proximal to the carina.  NG tube descends below the level of the image, with the side port just below the GE junction.  Diffuse interstitial prominence and mild bibasilar opacities are similar.  Aortic atherosclerosis.  Upper normal heart size.  No pneumothorax.  Small effusions not excluded.  Diffuse osteopenia.  IMPRESSION: Endotracheal tube tip 6 cm proximal to the carina.  NG tube descends below the level of the image with the side port just below the GE junction.  Consider advancing 5 cm.  Interstitial prominence and bibasilar opacities are similar to prior.   Original Report Authenticated By: Jearld Lesch, M.D.   Dg Chest Port 1 View 04/08/2013   *RADIOLOGY REPORT*  Clinical Data: Shortness  of breath, hypoxia  PORTABLE CHEST - 1 VIEW  Comparison: 03/17/2013  Findings: Hyperinflation with interstitial coarsening.  Bibasilar opacities, increased on the left.  Small effusions suggested.  No pneumothorax.  Aortic tortuosity atherosclerosis. Heart size upper normal to mildly enlarged.  No pneumothorax.  Osteopenia and multilevel degenerative change.  IMPRESSION: Interstitial prominence is in part chronic though superimposed interstitial edema is suspected.  Bibasilar opacities; atelectasis versus infiltrate, mildly increased on the left.   Original Report Authenticated By: Jearld Lesch, M.D.   Discharge Medications     Medication List    STOP taking these medications       isosorbide mononitrate 30 MG 24 hr tablet  Commonly known as:  IMDUR     L-Lysine 500 MG Caps     methylPREDNISolone 4 MG tablet  Commonly known as:  MEDROL DOSEPAK     warfarin 5 MG tablet  Commonly known as:  COUMADIN      TAKE these medications       aspirin EC 81 MG tablet  Take 1 tablet (81 mg total) by mouth daily.     b complex vitamins tablet  Take 1 tablet by mouth daily.     bisoprolol 5 MG  tablet  Commonly known as:  ZEBETA  Take 1 tablet (5 mg total) by mouth daily.     clopidogrel 75 MG tablet  Commonly known as:  PLAVIX  Take 1 tablet (75 mg total) by mouth daily with breakfast.     famotidine 10 MG tablet  Commonly known as:  PEPCID AC  Take 1 tablet (10 mg total) by mouth 2 (two) times daily.     fish oil-omega-3 fatty acids 1000 MG capsule  Take 1 g by mouth 2 (two) times daily.     furosemide 40 MG tablet  Commonly known as:  LASIX  Take 1 tablet (40 mg total) by mouth daily.     loratadine 10 MG tablet  Commonly known as:  CLARITIN  Take 1 tablet (10 mg total) by mouth every other day as needed for allergies.     nitroGLYCERIN 0.4 MG SL tablet  Commonly known as:  NITROSTAT  Place 1 tablet (0.4 mg total) under the tongue every 5 (five) minutes x 3 doses as needed for chest pain.     Vitamin D-3 5000 UNITS Tabs  Take 1 tablet by mouth daily.        Disposition   The patient will be discharged in stable condition to home. Discharge Orders   Future Appointments Provider Department Dept Phone   04/17/2013 11:10 AM Beatrice Lecher, PA-C [facility] Main Office [place]) [phone]   04/17/2013 11:30 AM [facility] Main Office [place]) [phone]   04/24/2013 2:30 PM [name], MD [facility] [phone]   05/07/2013 4:00 PM Tonny Bollman, MD [facility] Main Office [place]) [phone]   Future Orders Complete By Expires     Diet - low sodium heart healthy  As directed     Discharge instructions  As directed     Comments:      For patients with congestive heart failure, we give them these special instructions:  1. Follow a low-salt diet and watch your fluid intake. In general, you should not be taking in more than 2 liters of fluid per day (no more than 8 glasses per day). Some patients are restricted to less than 1.5 liters of fluid per day (no more than 6 glasses  per day). This includes  sources of water in foods like soup, coffee, tea, milk, etc. 2. Weigh yourself on the same scale at same time of day and keep a log. 3. Call your doctor: (Anytime you feel any of the following symptoms)  - 3-4 pound weight gain in 1-2 days or 2 pounds overnight  - Shortness of breath, with or without a dry hacking cough  - Swelling in the hands, feet or stomach  - If you have to sleep on extra pillows at night in order to breathe   IT IS IMPORTANT TO LET YOUR DOCTOR KNOW EARLY ON IF YOU ARE HAVING SYMPTOMS SO WE CAN HELP YOU!    Increase activity slowly  As directed     Comments:      No driving for 1 week. No lifting over 10 lbs for 2 weeks. No sexual activity for 2 weeks.      Follow-up Information   Follow up with Tereso Newcomer, PA-C. (04/17/13 at 11:10am with labwork)    Contact information:   [address] [place] [address] [phone] [facility] office         Duration of Discharge Encounter: Greater than 30 minutes including physician and PA time.  Signed, Ronie Spies PA-C 04/11/2013, 2:51 PM

## 2013-04-11 NOTE — Progress Notes (Signed)
    Subjective:  Feels good - ready to go home. Breathing better. Has been up out of bed without problems. No CP.  Objective:  Vital Signs in the last 24 hours: Temp:  [97 F (36.1 C)-98 F (36.7 C)] 97 F (36.1 C) (05/15 0400) Pulse Rate:  [65-69] 65 (05/15 0400) Resp:  [18] 18 (05/15 0400) BP: (140-149)/(55-61) 140/61 mmHg (05/15 0400) SpO2:  [96 %-97 %] 96 % (05/15 0400) Weight:  [56.6 kg (124 lb 12.5 oz)] 56.6 kg (124 lb 12.5 oz) (05/15 0525)  Intake/Output from previous day: 05/14 0701 - 05/15 0700 In: 360 [P.O.:360] Out: 640 [Urine:640]  Physical Exam: Pt is alert and oriented, pleasant elderly woman in NAD HEENT: normal Neck: JVP - normal, carotids 2+= without bruits Lungs: CTA bilaterally CV: RRR with grade 2/6 SEM at the LSB Abd: soft, NT, Positive BS, no hepatomegaly Ext: no C/C/E, distal pulses intact and equal Skin: diffuse rash improving   Lab Results:  Recent Labs  04/09/13 0345 04/10/13 0401  WBC 13.6* 11.3*  HGB 11.2* 11.8*  PLT 252 278    Recent Labs  04/10/13 0401 04/11/13 0345  NA 139 138  K 3.9 4.1  CL 100 98  CO2 29 26  GLUCOSE 94 92  BUN 51* 51*  CREATININE 2.52* 2.58*    Recent Labs  04/08/13 2224 04/10/13 0319  TROPONINI 2.12* 2.40*    Tele: Sinus rhythm, no arrhythmia. Personally reviewed.  Assessment/Plan:  1. Acute respiratory failure secondary to cardiogenic pulmonary edema  2. Acute on chronic diastolic CHF  3. CAD with Type 2 NSTEMI, demand ischemia suspected  4. CKD Stage 4  5. Allergic rash - likely coumadin - improving 6. Paroxysmal atrial fibrillation  Pt clinically improved. Would discharge on current medical program which I have reviewed. Needs f/u BMET and office visit late next week. thx   Tonny Bollman, M.D. 04/11/2013, 1:29 PM

## 2013-04-11 NOTE — Progress Notes (Signed)
Physical Therapy Treatment Patient Details Name: [name] MRN: [id number] DOB: [birth year] Today's Date: 04/11/2013 Time: 4098-1191 PT Time Calculation (min): 11 min  PT Assessment / Plan / Recommendation Comments on Treatment Session  Pt making progress toward her goals with improved balance with gait today.    Follow Up Recommendations  Home health PT;Supervision - Intermittent           Equipment Recommendations  None recommended by PT       Frequency Min 3X/week      Precautions / Restrictions Precautions Precautions: Fall Restrictions Weight Bearing Restrictions: No       Mobility  Bed Mobility Bed Mobility: Not assessed Details for Bed Mobility Assistance: pt seated on side upon arrival to room and at end of session. Self reports no trouble with getting up/down into bed. Transfers Sit to Stand: 7: Independent;From bed;With upper extremity assist Stand to Sit: 7: Independent;To bed;With upper extremity assist Details for Transfer Assistance: no cues or assist needed with transfers. Ambulation/Gait Ambulation/Gait Assistance: 5: Supervision Ambulation Distance (Feet): 30 Feet Assistive device: None Ambulation/Gait Assistance Details: Pt declined leaving room due to waiting on papers to go home. Improved balance with gait today with only one episode of lateral staggering that pt self corrected.                 Gait Pattern: Step-through pattern;Decreased stride length;Narrow base of support Gait velocity: decreased Stairs: No (Pt declined practice "I will wait and do that at home")      PT Goals Acute Rehab PT Goals Pt will go Supine/Side to Sit: Independently PT Goal: Supine/Side to Sit - Progress: Not met Pt will go Sit to Stand: Independently PT Goal: Sit to Stand - Progress: Met Pt will Ambulate: 51 - 150 feet;with modified independence;with least restrictive assistive device PT Goal: Ambulate - Progress: Progressing toward goal Pt will Go Up /  Down Stairs: Flight;with supervision;with least restrictive assistive device PT Goal: Up/Down Stairs - Progress: Not met  Visit Information  Last PT Received On: 04/11/13 Assistance Needed: +1    Subjective Data  Subjective: No new complaints, ready to go home.   Cognition  Cognition Arousal/Alertness: Awake/alert Behavior During Therapy: WFL for tasks assessed/performed Overall Cognitive Status: Within Functional Limits for tasks assessed       End of Session PT - End of Session Equipment Utilized During Treatment: Gait belt Activity Tolerance: Patient tolerated treatment well Patient left: in bed;with call bell/phone within reach Nurse Communication: Mobility status   GP     Sallyanne Kuster 04/11/2013, 2:31 PM  Sallyanne Kuster, PTA Office- [phone]

## 2013-04-14 LAB — CULTURE, BLOOD (ROUTINE X 2): Culture: NO GROWTH

## 2013-04-17 ENCOUNTER — Other Ambulatory Visit (INDEPENDENT_AMBULATORY_CARE_PROVIDER_SITE_OTHER): Payer: Medicare Other

## 2013-04-17 ENCOUNTER — Ambulatory Visit (INDEPENDENT_AMBULATORY_CARE_PROVIDER_SITE_OTHER): Payer: Medicare Other | Admitting: Physician Assistant

## 2013-04-17 ENCOUNTER — Encounter: Payer: Self-pay | Admitting: Physician Assistant

## 2013-04-17 VITALS — BP 148/72 | HR 57 | Ht 61.0 in | Wt 129.8 lb

## 2013-04-17 DIAGNOSIS — I5032 Chronic diastolic (congestive) heart failure: Secondary | ICD-10-CM

## 2013-04-17 DIAGNOSIS — T7840XA Allergy, unspecified, initial encounter: Secondary | ICD-10-CM

## 2013-04-17 DIAGNOSIS — I4891 Unspecified atrial fibrillation: Secondary | ICD-10-CM

## 2013-04-17 DIAGNOSIS — I2510 Atherosclerotic heart disease of native coronary artery without angina pectoris: Secondary | ICD-10-CM

## 2013-04-17 DIAGNOSIS — N189 Chronic kidney disease, unspecified: Secondary | ICD-10-CM

## 2013-04-17 DIAGNOSIS — N184 Chronic kidney disease, stage 4 (severe): Secondary | ICD-10-CM

## 2013-04-17 LAB — BASIC METABOLIC PANEL
BUN: 51 mg/dL — ABNORMAL HIGH (ref 6–23)
Chloride: 97 mEq/L (ref 96–112)
Creatinine, Ser: 3 mg/dL — ABNORMAL HIGH (ref 0.4–1.2)
Glucose, Bld: 101 mg/dL — ABNORMAL HIGH (ref 70–99)

## 2013-04-17 NOTE — Progress Notes (Signed)
[address], [address] [place], [place]  [address] Phone: [phone] Fax:  [phone]  Date:  04/17/2013   ID:  [name], DOB 13-May-1930, MRN [id number]  PCP:  [name], MD  Primary Cardiologist:  Dr. Tonny Bollman     History of Present Illness: [name] is a 79 y.o. female who returns for f/u after a recent admission to the hospital 5/12-5/15 with a/c diastolic CHF in setting of allergic reaction (presumed to be from coumadin).  She has a hx of atrial fibrillation, chronic combined systolic and diastolic CHF, stage 3-4 CKD, HTN, CAD, LBBB. She suffered a non-STEMI 7/13 treated with a DES to the OM. Severe residual disease in the left circumflex subbranch treated medically unless she has refractory angina. She was admitted in 9/13 with acute on chronic CHF in the setting of AFib with RVR. Previous EF in 7/13 was normal by echocardiogram. In 9/13, echo demonstrated an EF of 35%. She was placed on amiodarone. This has subsequently been d/c due to nausea.  EF has improved by echo back to normal.  Last echo 4/13: EF 55-60%, moderate MAC, mild LAE, lipomatous atrial septal hypertrophy, no RA mass.  She was hospitalized again in 4/14 with A. Fib with RVR and CHF. She was seen in followup 04/06/13 by Dr. Excell Seltzer. She was having symptoms of an allergic reaction. Diltiazem was stopped and she was placed on Medrol Dosepak.   She presented the hospital 2 days later in severe respiratory distress. She was intubated. Cardiac markers were elevated. She was volume overloaded. She was diuresed with IV Lasix. Non-STEMI was felt to be due to demand ischemia. Medical therapy was pursued. She is at high risk for contrast-induced nephropathy with CKD. She continued to have a rash and it was felt that Coumadin may be the culprit for her reaction. Coumadin was therefore discontinued. Novel agents were not felt to be appropriate for the patient. She was placed back on aspirin and  Plavix. Since d/c, she is doing better.  Notes Class II dyspnea.  No chest pain. Sleeps in a recliner chronically.  No PND.  No edema.  No syncope.  No palpitations.  Weights stable at home.   Labs (08/21/12): K 5.9, creatinine 2.4  Labs (08/23/12): K 5.3, creatinine 2.5  Labs (08/31/12): K 4.7, creatinine 2.1  Labs (5/14):  K 4.1, Cr 2.58, ALT 21, Hgb 11.8  Wt Readings from Last 3 Encounters:  04/17/13 129 lb 12.8 oz (58.877 kg)  04/11/13 124 lb 12.5 oz (56.6 kg)  04/05/13 136 lb (61.689 kg)     Past Medical History  Diagnosis Date  . Lower extremity edema     bilateral  . CKD (chronic kidney disease), stage IV     a. baseline creat previously 1.6->2 - elevated during 04-12-2013 adm.  . Labile hypertension   . Anxiety   . Failure to thrive in childhood   . History of shingles July 2004    on the left side of her thorax and postherpetic neuralgia  . History of psoriasis   . History of recurrent UTIs   . Major depressive disorder, single episode, severe 13-Apr-2003    "when my husband died"  . Valvular heart disease     a.  11/2012 Echo: Mild AS/AI/MR;  b. 02/2013 Echo: EF 55-60%, mildly dil LA, calcified AV/MV.  Marland Kitchen CAD (coronary artery disease)     a. NSTEMI 05/2012 s/p DES to OM (residual severe LCx subbranch dz for  med rx unless refractory angina recurs) b. NSTEMI 03/2013 felt 2/2 demand in setting of resp failure, for med rx.  Marland Kitchen LBBB (left bundle branch block)     intermittent  . Chronic combined systolic and diastolic CHF (congestive heart failure)     a. 05/2012 Echo: EF 60-65%;  b. 07/2012 Echo in setting of afib: EF 35%;  c. 02/2013 Echo: EF 55-60%, mildly dil LA.  Marland Kitchen PAF (paroxysmal atrial fibrillation)     a. Dx 07/2012 -> Amio started but later dc'd 2/2 nausea/NSR. b. 02/2013 Coumadin initiated, dc'd 03/2013 2/2 allergic reaction. c. Ultimately not on NOAC due to age, Coumadin allergy and low CrCl.  Marland Kitchen Respiratory failure     a. Requiring intubation 03/2013, secondary to cardiogenic pulmonary  edema.    Current Outpatient Prescriptions  Medication Sig Dispense Refill  . aspirin EC 81 MG tablet Take 1 tablet (81 mg total) by mouth daily.      Marland Kitchen b complex vitamins tablet Take 1 tablet by mouth daily.        . bisoprolol (ZEBETA) 5 MG tablet Take 1 tablet (5 mg total) by mouth daily.  30 tablet  6  . Cholecalciferol (VITAMIN D-3) 5000 UNITS TABS Take 1 tablet by mouth daily.      . clopidogrel (PLAVIX) 75 MG tablet Take 1 tablet (75 mg total) by mouth daily with breakfast.  30 tablet  6  . famotidine (PEPCID AC) 10 MG tablet Take 1 tablet (10 mg total) by mouth 2 (two) times daily.      . fish oil-omega-3 fatty acids 1000 MG capsule Take 1 g by mouth 2 (two) times daily.       . furosemide (LASIX) 40 MG tablet Take 1 tablet (40 mg total) by mouth daily.  30 tablet  6  . loratadine (CLARITIN) 10 MG tablet Take 1 tablet (10 mg total) by mouth every other day as needed for allergies.      . nitroGLYCERIN (NITROSTAT) 0.4 MG SL tablet Place 1 tablet (0.4 mg total) under the tongue every 5 (five) minutes x 3 doses as needed for chest pain.  25 tablet  4   No current facility-administered medications for this visit.    Allergies:    Allergies  Allergen Reactions  . Metoprolol Swelling    Takes bisoprolol without issue  . Norvasc (Amlodipine Besylate) Swelling    Takes nisoldipine without issue  . Sulfonamide Derivatives Hives, Itching and Swelling  . Amiodarone Nausea And Vomiting  . Medrol (Methylprednisolone)     Fluid retention/CHF exacerbation - caution with steroids in future if ever needed  . Statins     Myalgias - "I won't take them"  . Coumadin (Warfarin Sodium) Rash    Social History:  The patient  reports that she quit smoking about 33 years ago. Her smoking use included Cigarettes. She has a 45 pack-year smoking history. She has never used smokeless tobacco. She reports that  drinks alcohol. She reports that she does not use illicit drugs.   ROS:  Please see the  history of present illness.      All other systems reviewed and negative.   PHYSICAL EXAM: VS:  BP 148/72  Pulse 57  Ht 5\' 1"  (1.549 m)  Wt 129 lb 12.8 oz (58.877 kg)  BMI 24.54 kg/m2 Well nourished, well developed, in no acute distress HEENT: normal Neck: no JVD Cardiac:  normal S1, S2; RRR; 2/6 systolic murmur along LSB Lungs:  clear to auscultation  bilaterally, no wheezing, rhonchi or rales Abd: soft, nontender, no hepatomegaly Ext: no edema Skin: warm and dry Neuro:  CNs 2-12 intact, no focal abnormalities noted  EKG:  Sinus brady, HR 57, LVH, diffuse ST changes, no change from prior tracing     ASSESSMENT AND PLAN:  1. Chronic Diastolic CHF:  Volume stable.  We discussed weighing daily to take take extra Lasix if weight up 3 lbs in one day or 5 lbs in one week.  Check BMET today.   2. CAD:  Recent NSTEMI likely demand ischemia.  Med Rx is recommended.  Continue DAPT.  She is statin intolerant. 3. Atrial Fibrillation:  Maintaining NSR.  Diltiazem recently stopped due to concern for allergy.  Continue bisoprolol for now.  She is not a candidate for NOAC.  Cannot take warfarin due to allergy.  Continue DAPT for anticoagulation. 4. Coumadin Allergy:  Rash is resolving. 5. CKD:  Check BMET today. 6. Disposition:  F/u with Dr. Tonny Bollman in June as planned.   Luna Glasgow, PA-C  11:29 AM 04/17/2013

## 2013-04-17 NOTE — Patient Instructions (Addendum)
KEEP YOUR UPCOMING APPT WITH DR. Excell Seltzer 05/07/13  LAB TODAY; BMET  NO CHANGES WERE MADE TODAY  IF YOUR WEIGHT IS INCREASED BY 3 LB'S IN 1 DAY OR 5 LB'S IN 1 WEEK TAKE EXTRA 40 MG LASIX THAT DAY AND CALL [phone] SCOTT WEAVER, PAC TO LET us KNOW

## 2013-04-23 ENCOUNTER — Telehealth: Payer: Self-pay | Admitting: *Deleted

## 2013-04-23 ENCOUNTER — Telehealth: Payer: Self-pay | Admitting: Cardiovascular Disease

## 2013-04-23 DIAGNOSIS — I2510 Atherosclerotic heart disease of native coronary artery without angina pectoris: Secondary | ICD-10-CM

## 2013-04-23 MED ORDER — FUROSEMIDE 40 MG PO TABS: 40.0000 mg | ORAL_TABLET | Freq: Every day | ORAL | Status: DC

## 2013-04-23 NOTE — Telephone Encounter (Signed)
pt notified about lab results and bmet on 04/26/13, pt read back instructions to me x 2 with verbal understanding

## 2013-04-23 NOTE — Telephone Encounter (Signed)
pt notified about lab results and bmet on 04/26/13, pt read back instructions to me x 2 with verbal understanding 

## 2013-04-23 NOTE — Telephone Encounter (Signed)
Message copied by Tarri Fuller on Tue Apr 23, 2013  2:05 PM ------      Message from: [place], [place] T      Created: Tue Apr 23, 2013  1:49 PM       Creatinine increased since last checked.      Hold Lasix x 1 day; then take Lasix 20 mg x 1 the next day; then resume Lasix 40 mg QD.      Check BMET in 3 days.      Weigh daily and call if:  Weight up 3 lbs in one day, increased swelling or increased dyspnea.       Tereso Newcomer, PA-C        04/23/2013 1:49 PM ------

## 2013-04-23 NOTE — Telephone Encounter (Signed)
Follow up      rtn call back to nurse  

## 2013-04-23 NOTE — Telephone Encounter (Signed)
daughter notified about lab results and lasix dose change, pt's home # voice mail box not set up yet, daughter verbally read back to me the instrcutions for lasix and lab, bmet 04/26/13  Hold lasix 04/24/13 04/25/13 lasix 20 mg  Start 04/26/13 lasix 40 mg daily; with bmet 04/26/13

## 2013-04-24 ENCOUNTER — Encounter: Payer: Self-pay | Admitting: Family Medicine

## 2013-04-24 ENCOUNTER — Ambulatory Visit (INDEPENDENT_AMBULATORY_CARE_PROVIDER_SITE_OTHER): Payer: Medicare Other | Admitting: Family Medicine

## 2013-04-24 VITALS — BP 144/60 | Temp 98.5°F | Wt 130.0 lb

## 2013-04-24 DIAGNOSIS — I4891 Unspecified atrial fibrillation: Secondary | ICD-10-CM

## 2013-04-24 DIAGNOSIS — I5043 Acute on chronic combined systolic (congestive) and diastolic (congestive) heart failure: Secondary | ICD-10-CM

## 2013-04-24 DIAGNOSIS — N184 Chronic kidney disease, stage 4 (severe): Secondary | ICD-10-CM

## 2013-04-24 DIAGNOSIS — I509 Heart failure, unspecified: Secondary | ICD-10-CM

## 2013-04-24 DIAGNOSIS — I10 Essential (primary) hypertension: Secondary | ICD-10-CM

## 2013-04-24 NOTE — Patient Instructions (Signed)
Continue with daily weights and be in touch if weight gain of 3 pounds or more in one day. Also be in touch for any increase shortness of breath. Continue to follow low sodium diet.

## 2013-04-24 NOTE — Progress Notes (Signed)
Subjective:    Patient ID: [name], female    DOB: 07/23/1930, 79 y.o.   MRN: [id number]  HPI Followup recent hospitalization. Patient has history of CAD, atrial fibrillation, history of acute on chronic diastolic heart failure, hypertension, chronic kidney disease, osteoporosis. Recent skin rash possibly related to diltiazem or Coumadin. Her diltiazem was stopped and she was placed on prednisone. She presented on 04/08/2013 with acute respiratory failure secondary to cardiogenic pulmonary edema. She was discharged 3 days later.  She had severe respiratory distress requiring intubation. She denied any chest pain. Most recent echo April 2014 ejection fraction 55-60%. She had elevated white blood cell count on admission but this was felt to be related to steroids. No evidence for acute infection.  Decision was made to avoid novel agents for anticoagulation because of her chronic kidney disease. She is now back on aspirin and Plavix and tolerating well Recent creatinine 3.0. She has not taken Lasix today and instructed to take half dose tomorrow then resume full dose Friday with followup labs scheduled in the cardiology office  Her skin rash is essentially resolved. Respiratory status back to baseline. Daily weights have been stable.  Past Medical History  Diagnosis Date  . Lower extremity edema     bilateral  . CKD (chronic kidney disease), stage IV     a. baseline creat previously 1.6->2 - elevated during 04/29/13 adm.  . Labile hypertension   . Anxiety   . Failure to thrive in childhood   . History of shingles July 2004    on the left side of her thorax and postherpetic neuralgia  . History of psoriasis   . History of recurrent UTIs   . Major depressive disorder, single episode, severe 04/30/2003    "when my husband died"  . Valvular heart disease     a.  11/2012 Echo: Mild AS/AI/MR;  b. 02/2013 Echo: EF 55-60%, mildly dil LA, calcified AV/MV.  Marland Kitchen CAD (coronary artery disease)     a.  NSTEMI 05/2012 s/p DES to OM (residual severe LCx subbranch dz for med rx unless refractory angina recurs) b. NSTEMI 04/29/13 felt 2/2 demand in setting of resp failure, for med rx.  Marland Kitchen LBBB (left bundle branch block)     intermittent  . Chronic combined systolic and diastolic CHF (congestive heart failure)     a. 05/2012 Echo: EF 60-65%;  b. 07/2012 Echo in setting of afib: EF 35%;  c. 02/2013 Echo: EF 55-60%, mildly dil LA.  Marland Kitchen PAF (paroxysmal atrial fibrillation)     a. Dx 07/2012 -> Amio started but later dc'd 2/2 nausea/NSR. b. 02/2013 Coumadin initiated, dc'd April 29, 2013 2/2 allergic reaction. c. Ultimately not on NOAC due to age, Coumadin allergy and low CrCl.  Marland Kitchen Respiratory failure     a. Requiring intubation 2013/04/29, secondary to cardiogenic pulmonary edema.   Past Surgical History  Procedure Laterality Date  . Salpingoophorectomy      "cyst removed"  . Appendectomy    . Breast cyst excision    . Abdominal hysterectomy    . Cataract extraction, bilateral  ~ 04/29/2009  . Cardiac surgery      reports that she quit smoking about 33 years ago. Her smoking use included Cigarettes. She has a 45 pack-year smoking history. She has never used smokeless tobacco. She reports that  drinks alcohol. She reports that she does not use illicit drugs. family history includes Cancer in an unspecified family member; Diabetes in an unspecified family member; and Lung  cancer in an unspecified family member. Allergies  Allergen Reactions  . Metoprolol Swelling    Takes bisoprolol without issue  . Norvasc (Amlodipine Besylate) Swelling    Takes nisoldipine without issue  . Sulfonamide Derivatives Hives, Itching and Swelling  . Amiodarone Nausea And Vomiting  . Medrol (Methylprednisolone)     Fluid retention/CHF exacerbation - caution with steroids in future if ever needed  . Statins     Myalgias - "I won't take them"  . Coumadin (Warfarin Sodium) Rash      Review of Systems  Constitutional: Negative for fever,  appetite change and unexpected weight change.  Respiratory: Negative for cough.   Cardiovascular: Negative for chest pain, palpitations and leg swelling.  Genitourinary: Negative for dysuria.  Neurological: Negative for dizziness and syncope.       Objective:   Physical Exam  Constitutional: She appears well-developed and well-nourished.  HENT:  Mouth/Throat: Oropharynx is clear and moist.  Neck: Neck supple. No thyromegaly present.  Cardiovascular: Normal rate and regular rhythm.   Murmur heard. Pulmonary/Chest: Effort normal and breath sounds normal. No respiratory distress. She has no wheezes. She has no rales.  Musculoskeletal: She exhibits no edema.  Neurological: She is alert.          Assessment & Plan:  #1 chronic diastolic heart failure. Currently stable. Recent pulmonary edema as above possibly related to recent steroids. Continue daily weights. Electrolyte monitoring through cardiology #2 atrial fibrillation. Currently sinus rhythm- clinically. Recent discontinuation of diltiazem and Coumadin secondary to concern for possible allergy. Not a candidate for novel oral anticoagulants secondary to chronic kidney disease #3 chronic kidney disease. Basic metabolic panel already ordered by cardiology in 2 days #4 recent skin rash possible allergy from diltiazem or Coumadin resolving.

## 2013-04-25 ENCOUNTER — Telehealth: Payer: Self-pay | Admitting: Cardiovascular Disease

## 2013-04-25 NOTE — Telephone Encounter (Signed)
Follow up    Patient stated she was told she have lab work in the am . She need to discuss due to medication

## 2013-04-25 NOTE — Telephone Encounter (Signed)
New problem   Pt has question about how she was to take her medication

## 2013-04-25 NOTE — Telephone Encounter (Signed)
I spoke with the pt and she mixed up her medications. The pt thought she took Furosemide out of her pill boxes but she took Plavix out by accident.  Per instructions on recent BMP the pt should have held furosemide. I spoke with Tereso Newcomer PA-C and the pt will hold Furosemide on 5/30, Take 20mg  Lasix on 5/31 and resume 40mg  daily on 6/1.  The pt will have BMP checked on 6/2. Pt verbalized understanding of instructions.

## 2013-04-29 ENCOUNTER — Other Ambulatory Visit (INDEPENDENT_AMBULATORY_CARE_PROVIDER_SITE_OTHER): Payer: Medicare Other

## 2013-04-29 ENCOUNTER — Telehealth: Payer: Self-pay | Admitting: *Deleted

## 2013-04-29 ENCOUNTER — Ambulatory Visit (INDEPENDENT_AMBULATORY_CARE_PROVIDER_SITE_OTHER): Payer: Medicare Other | Admitting: Internal Medicine

## 2013-04-29 VITALS — BP 132/70 | HR 84 | Wt 127.0 lb

## 2013-04-29 DIAGNOSIS — I2510 Atherosclerotic heart disease of native coronary artery without angina pectoris: Secondary | ICD-10-CM

## 2013-04-29 DIAGNOSIS — L509 Urticaria, unspecified: Secondary | ICD-10-CM

## 2013-04-29 LAB — BASIC METABOLIC PANEL
Calcium: 9.2 mg/dL (ref 8.4–10.5)
Creatinine, Ser: 2.7 mg/dL — ABNORMAL HIGH (ref 0.4–1.2)
GFR: 18.27 mL/min — ABNORMAL LOW (ref >60.00)
Glucose, Bld: 94 mg/dL (ref 70–99)
Sodium: 136 mEq/L (ref 135–145)

## 2013-04-29 MED ORDER — LORATADINE 10 MG PO TABS: 10.0000 mg | ORAL_TABLET | ORAL | Status: DC | PRN

## 2013-04-29 MED ORDER — HYDROXYZINE HCL 25 MG PO TABS: ORAL_TABLET | ORAL | Status: DC

## 2013-04-29 MED ORDER — METHYLPREDNISOLONE 4 MG PO KIT: PACK | ORAL | Status: DC

## 2013-04-29 MED ORDER — RANITIDINE HCL 150 MG PO TABS: 150.0000 mg | ORAL_TABLET | Freq: Two times a day (BID) | ORAL | Status: DC

## 2013-04-29 NOTE — Progress Notes (Signed)
HPI [name] is a 79 y.o. female who returns for f/u after a recent admission to the hospital 5/12-5/15 with a/c diastolic CHF in setting of allergic reaction (presumed to be from coumadin). She has a hx of atrial fibrillation, chronic combined systolic and diastolic CHF, stage 3-4 CKD, HTN, CAD, LBBB. She suffered a non-STEMI 7/13 treated with a DES to the OM. Severe residual disease in the left circumflex subbranch treated medically unless she has refractory angina. She was admitted in 9/13 with acute on chronic CHF in the setting of AFib with RVR. Previous EF in 7/13 was normal by echocardiogram. In 9/13, echo demonstrated an EF of 35%. She was placed on amiodarone. This has subsequently been d/c due to nausea. EF has improved by echo back to normal. Last echo 4/13: EF 55-60%, moderate MAC, mild LAE, lipomatous atrial septal hypertrophy, no RA mass. She was hospitalized again in 4/14 with A. Fib with RVR and CHF. She was seen in followup 04/06/13 by Dr. Excell Seltzer. She was having symptoms of an allergic reaction. Diltiazem was stopped and she was placed on Medrol Dosepak.  She presented the hospital 2 days later in severe respiratory distress. She was intubated. Cardiac markers were elevated. She was volume overloaded. She was diuresed with IV Lasix. Non-STEMI was felt to be due to demand ischemia. Medical therapy was pursued. She is at high risk for contrast-induced nephropathy with CKD. She continued to have a rash and it was felt that Coumadin may be the culprit for her reaction. Coumadin was therefore discontinued. Novel agents were not felt to be appropriate for the patient. She was placed back on aspirin and Plavix. She was seen by Wende Mott in clinic 3 days ago.  Doing good. She says that night she began to have swelling in lip.  Over the weekend this has gotten worse with some tongue swelling.  She is have some problems eating.  No wheezing  No SOB  No CP Rash on back is getting  better.  Allergies  Allergen Reactions  . Metoprolol Swelling    Takes bisoprolol without issue  . Norvasc (Amlodipine Besylate) Swelling    Takes nisoldipine without issue  . Sulfonamide Derivatives Hives, Itching and Swelling  . Amiodarone Nausea And Vomiting  . Medrol (Methylprednisolone)     Fluid retention/CHF exacerbation - caution with steroids in future if ever needed  . Statins     Myalgias - "I won't take them"  . Coumadin (Warfarin Sodium) Rash    Current Outpatient Prescriptions  Medication Sig Dispense Refill  . aspirin EC 81 MG tablet Take 1 tablet (81 mg total) by mouth daily.      Marland Kitchen b complex vitamins tablet Take 1 tablet by mouth daily.        . bisoprolol (ZEBETA) 5 MG tablet Take 1 tablet (5 mg total) by mouth daily.  30 tablet  6  . Cholecalciferol (VITAMIN D-3) 5000 UNITS TABS Take 1 tablet by mouth daily.      . clopidogrel (PLAVIX) 75 MG tablet Take 1 tablet (75 mg total) by mouth daily with breakfast.  30 tablet  6  . fish oil-omega-3 fatty acids 1000 MG capsule Take 1 g by mouth 2 (two) times daily.       . furosemide (LASIX) 40 MG tablet Take 1 tablet (40 mg total) by mouth daily. Hold lasix 04/24/13; 04/25/13 lasix 20 mg; Start 04/26/13 lasix 40 mg daily; with bmet 04/26/13      . hydrOXYzine (  ATARAX/VISTARIL) 25 MG tablet Take Daily, at night  30 tablet  0  . loratadine (CLARITIN) 10 MG tablet Take 1 tablet (10 mg total) by mouth every other day as needed for allergies (Take in AM (on days when taking)).      Marland Kitchen methylPREDNISolone (MEDROL, PAK,) 4 MG tablet follow package directions  21 tablet  0  . nitroGLYCERIN (NITROSTAT) 0.4 MG SL tablet Place 1 tablet (0.4 mg total) under the tongue every 5 (five) minutes x 3 doses as needed for chest pain.  25 tablet  4  . ranitidine (ZANTAC) 150 MG tablet Take 1 tablet (150 mg total) by mouth 2 (two) times daily.  60 tablet  3   No current facility-administered medications for this visit.    Past Medical History   Diagnosis Date  . Lower extremity edema     bilateral  . CKD (chronic kidney disease), stage IV     a. baseline creat previously 1.6->2 - elevated during 04/30/13 adm.  . Labile hypertension   . Anxiety   . Failure to thrive in childhood   . History of shingles July 2004    on the left side of her thorax and postherpetic neuralgia  . History of psoriasis   . History of recurrent UTIs   . Major depressive disorder, single episode, severe 05-01-2003    "when my husband died"  . Valvular heart disease     a.  11/2012 Echo: Mild AS/AI/MR;  b. 02/2013 Echo: EF 55-60%, mildly dil LA, calcified AV/MV.  Marland Kitchen CAD (coronary artery disease)     a. NSTEMI 05/2012 s/p DES to OM (residual severe LCx subbranch dz for med rx unless refractory angina recurs) b. NSTEMI 04-30-13 felt 2/2 demand in setting of resp failure, for med rx.  Marland Kitchen LBBB (left bundle branch block)     intermittent  . Chronic combined systolic and diastolic CHF (congestive heart failure)     a. 05/2012 Echo: EF 60-65%;  b. 07/2012 Echo in setting of afib: EF 35%;  c. 02/2013 Echo: EF 55-60%, mildly dil LA.  Marland Kitchen PAF (paroxysmal atrial fibrillation)     a. Dx 07/2012 -> Amio started but later dc'd 2/2 nausea/NSR. b. 02/2013 Coumadin initiated, dc'd April 30, 2013 2/2 allergic reaction. c. Ultimately not on NOAC due to age, Coumadin allergy and low CrCl.  Marland Kitchen Respiratory failure     a. Requiring intubation 2013-04-30, secondary to cardiogenic pulmonary edema.    Past Surgical History  Procedure Laterality Date  . Salpingoophorectomy      "cyst removed"  . Appendectomy    . Breast cyst excision    . Abdominal hysterectomy    . Cataract extraction, bilateral  ~ 04/30/09  . Cardiac surgery      Family History  Problem Relation Age of Onset  . Diabetes    . Cancer    . Lung cancer      History   Social History  . Marital Status: Widowed    Spouse Name: N/A    Number of Children: N/A  . Years of Education: N/A   Occupational History  . Not on file.    Social History Main Topics  . Smoking status: Former Smoker -- 1.50 packs/day for 30 years    Types: Cigarettes    Quit date: 09/01/1979  . Smokeless tobacco: Never Used  . Alcohol Use: Yes     Comment: 06/06/12 "mixed drink once or twice a year.to celebrate"  . Drug Use: No  . Sexually  Active: No   Other Topics Concern  . Not on file   Social History Narrative   Lives in [place].    Review of Systems:  All systems reviewed.  They are negative to the above problem except as previously stated.  Vital Signs: BP 132/70  Pulse 84  Wt 127 lb (57.607 kg)  BMI 24.01 kg/m2  SpO2 97%  Physical Exam Patient is in NAD HEENT:  Normocephalic, atraumatic. EOMI, PERRLA. Swelling of lower lip (mild)  Mild swelling of tongue  Neck: JVP is normal.  No bruits.  Lungs: clear to auscultation. No rales no wheezes.  Heart: Regular rate and rhythm. Normal S1, S2. No S3.   No significant murmurs. PMI not displaced.  Abdomen:  Supple, nontender. Normal bowel sounds. No masses. No hepatomegaly.  Extremities:   Good distal pulses throughout. No lower extremity edema.  Musculoskeletal :moving all extremities.  Neuro:   alert and oriented x3.  CN II-XII grossly intact. Skin:  Hives on Back  Excoriated leasions on UE.     Assessment and Plan:  1.  Allergic reaction.  Since the patient was seen on Friday she has had a flare in her symptoms. Still had rash on back that was getting better.  Now lip/tongue swelling.  She has a reported history of urticaria   Question if it is a rebound from recent drug reaction (to either dilt or coumadin )  Question if caused by something else.  Reviewed with Dr Beaulah Dinning [facility])  He recomm another Medrol dose pack, Hydroxyzine 25 mg at night  Zantac 150 bid.  Rec she be seen in clinic in 1 wk. Will help her with this appt  Wll need to watch volume closely  When steroids used in past she presented with pulmonary edema.   2.  CAD  No symptoms of  angina  3.  Atrial fib  Keep on ASA and Plavix.

## 2013-04-29 NOTE — Addendum Note (Signed)
Addended by: Pricilla Riffle on: 04/29/2013 02:03 PM   Modules accepted: Level of Service

## 2013-04-29 NOTE — Telephone Encounter (Signed)
Patient walk-in with c/o swelling to tongue, lips, mouth and diminished taste/appetite. Recently discharged from hospital on 04/11/13 related to acute resp reaction.  Will be seen as Nurse Visit and by DOD, Dr. Tenny Craw.

## 2013-04-29 NOTE — Progress Notes (Signed)
Walk in with complaint of present symptoms that have been ongoing for three days with worsening over past 24 hours - swelling of mouth, tongue, lips and cheeks, decreased taste and appetite with hives over most of back/arms and some hives on chest/stomach areas. Patient was recently discharged from hospital (515/14) from acute resp reaction process. Patient concerned as swelling is worsening. Dr. Tenny Craw notified and will see patient at this time. Dr. Tenny Craw is referring patient to [facility] to establish as a new patient. Appt scheduled for tomorrow, June 3rd @ 9:00am. Medication changes made and new medications ordered. Patient agreed to plan. Patient's daughter present to review changes and new appt information for [facility].  Notes faxed to [facility] at this time.

## 2013-04-30 ENCOUNTER — Telehealth: Payer: Self-pay | Admitting: *Deleted

## 2013-04-30 DIAGNOSIS — I2510 Atherosclerotic heart disease of native coronary artery without angina pectoris: Secondary | ICD-10-CM

## 2013-04-30 MED ORDER — FUROSEMIDE 40 MG PO TABS: 40.0000 mg | ORAL_TABLET | Freq: Every day | ORAL | Status: DC

## 2013-04-30 NOTE — Telephone Encounter (Signed)
Message copied by Tarri Fuller on Tue Apr 30, 2013  2:56 PM ------      Message from: [place], [place] T      Created: Mon Apr 29, 2013  9:06 PM       K+ ok.  Creatinine somewhat better.      Continue with current treatment plan.      Repeat BMET in 1 week.      Tereso Newcomer, PA-C        04/29/2013 9:06 PM ------

## 2013-04-30 NOTE — Telephone Encounter (Signed)
daughter notified about pt's lab results and aksed what dose of lasix should pt be taking now, and I explained that as of 5/30 she should be on lasix 40 mg daily, bmet 6/10 w/Cooper appt, daughter said ok and thank you

## 2013-05-07 ENCOUNTER — Ambulatory Visit (INDEPENDENT_AMBULATORY_CARE_PROVIDER_SITE_OTHER): Payer: Medicare Other | Admitting: Cardiovascular Disease

## 2013-05-07 ENCOUNTER — Encounter: Payer: Self-pay | Admitting: Cardiovascular Disease

## 2013-05-07 VITALS — BP 148/62 | HR 68 | Ht 61.0 in | Wt 127.0 lb

## 2013-05-07 DIAGNOSIS — I4891 Unspecified atrial fibrillation: Secondary | ICD-10-CM

## 2013-05-07 NOTE — Progress Notes (Signed)
HPI:   79 year old woman presenting for followup evaluation. She has a fairly complicated past cardiac history. She's recently struggled with atrial fibrillation and diastolic heart failure. She presented about one year ago with a non-ST elevation infarction and was treated with a drug-eluting stent in the obtuse marginal branch the left circumflex. At that time her primary symptom was chest tightness. Since then, she's had several episodes of symptomatic atrial fibrillation and subsequent acute heart failure. She's been intubated at least twice. She was anticoagulated with warfarin but developed a severe allergic skin reaction. She had a recurrence of an allergic reaction even after warfarin had been discontinued. This most recent episode involved lip and tongue swelling. She was given a Medrol Dosepak and referred to an allergist. She is doing much better now. She is back on aspirin and Plavix. She's not a candidate for novel anticoagulant drugs because of advanced chronic kidney disease. She denies shortness of breath, palpitations, or chest pain. This is the best she's felt in some time.  Outpatient Encounter Prescriptions as of 05/07/2013  Medication Sig Dispense Refill  . aspirin EC 81 MG tablet Take 1 tablet (81 mg total) by mouth daily.      Marland Kitchen b complex vitamins tablet Take 1 tablet by mouth daily.        . bisoprolol (ZEBETA) 5 MG tablet Take 1 tablet (5 mg total) by mouth daily.  30 tablet  6  . Cholecalciferol (VITAMIN D-3) 5000 UNITS TABS Take 1 tablet by mouth daily.      . clopidogrel (PLAVIX) 75 MG tablet Take 1 tablet (75 mg total) by mouth daily with breakfast.  30 tablet  6  . fish oil-omega-3 fatty acids 1000 MG capsule Take 1 g by mouth 2 (two) times daily.       . furosemide (LASIX) 40 MG tablet Take 1 tablet (40 mg total) by mouth daily.      . hydrOXYzine (ATARAX/VISTARIL) 25 MG tablet Take Daily, at night  30 tablet  0  . loratadine (CLARITIN) 10 MG tablet Take 1 tablet (10 mg  total) by mouth every other day as needed for allergies (Take in AM (on days when taking)).      Marland Kitchen methylPREDNISolone (MEDROL, PAK,) 4 MG tablet follow package directions  21 tablet  0  . nitroGLYCERIN (NITROSTAT) 0.4 MG SL tablet Place 1 tablet (0.4 mg total) under the tongue every 5 (five) minutes x 3 doses as needed for chest pain.  25 tablet  4  . ranitidine (ZANTAC) 150 MG tablet Take 1 tablet (150 mg total) by mouth 2 (two) times daily.  60 tablet  3   No facility-administered encounter medications on file as of 05/07/2013.    Allergies  Allergen Reactions  . Metoprolol Swelling    Takes bisoprolol without issue  . Norvasc (Amlodipine Besylate) Swelling    Takes nisoldipine without issue  . Sulfonamide Derivatives Hives, Itching and Swelling  . Amiodarone Nausea And Vomiting  . Medrol (Methylprednisolone)     Fluid retention/CHF exacerbation - caution with steroids in future if ever needed  . Statins     Myalgias - "I won't take them"  . Coumadin (Warfarin Sodium) Rash    Past Medical History  Diagnosis Date  . Lower extremity edema     bilateral  . CKD (chronic kidney disease), stage IV     a. baseline creat previously 1.6->2 - elevated during 03/2013 adm.  . Labile hypertension   . Anxiety   .  Failure to thrive in childhood   . History of shingles July 2004    on the left side of her thorax and postherpetic neuralgia  . History of psoriasis   . History of recurrent UTIs   . Major depressive disorder, single episode, severe 04-13-2003    "when my husband died"  . Valvular heart disease     a.  11/2012 Echo: Mild AS/AI/MR;  b. 02/2013 Echo: EF 55-60%, mildly dil LA, calcified AV/MV.  Marland Kitchen CAD (coronary artery disease)     a. NSTEMI 05/2012 s/p DES to OM (residual severe LCx subbranch dz for med rx unless refractory angina recurs) b. NSTEMI Apr 12, 2013 felt 2/2 demand in setting of resp failure, for med rx.  Marland Kitchen LBBB (left bundle branch block)     intermittent  . Chronic combined systolic  and diastolic CHF (congestive heart failure)     a. 05/2012 Echo: EF 60-65%;  b. 07/2012 Echo in setting of afib: EF 35%;  c. 02/2013 Echo: EF 55-60%, mildly dil LA.  Marland Kitchen PAF (paroxysmal atrial fibrillation)     a. Dx 07/2012 -> Amio started but later dc'd 2/2 nausea/NSR. b. 02/2013 Coumadin initiated, dc'd Apr 12, 2013 2/2 allergic reaction. c. Ultimately not on NOAC due to age, Coumadin allergy and low CrCl.  Marland Kitchen Respiratory failure     a. Requiring intubation 04/12/2013, secondary to cardiogenic pulmonary edema.   ROS: Negative except as per HPI  BP 148/62  Pulse 68  Ht 5\' 1"  (1.549 m)  Wt 57.607 kg (127 lb)  BMI 24.01 kg/m2  SpO2 95%  PHYSICAL EXAM: Pt is alert and oriented, pleasant elderly woman in NAD HEENT: normal Neck: JVP - normal, carotids 2+= with soft bilateral bruits Lungs: CTA bilaterally CV: RRR with grade 2/6 early systolic murmur at the LSB Abd: soft, NT, Positive BS, no hepatomegaly Ext: no C/C/E, distal pulses intact and equal Skin: warm/dry no rash  ASSESSMENT AND PLAN: 1. Paroxysmal atrial fibrillation. We will continue with aspirin and Plavix. She's allergic to Coumadin and is not a candidate for novel anticoagulant drugs. Her creatinine clearance is only about 15-20. She was intolerant to amiodarone. Her antiarrhythmic options are extremely limited in the setting of her coronary artery disease, heart failure, and renal disease. Unfortunately she is very symptomatic with atrial fib. Cardizem has also been stopped because of potential allergy with urticaria. I'll see her back in 3 months.  2. Coronary artery disease, native vessel. Continue current medical program.  3. Chronic diastolic heart failure. On evaluation today she appears euvolemic. She will continue on furosemide 40 mg daily.  Tonny Bollman 05/07/2013 5:48 PM

## 2013-05-07 NOTE — Patient Instructions (Signed)
Your physician recommends that you schedule a follow-up appointment in: 3 MONTHS with Dr Cooper  Your physician recommends that you continue on your current medications as directed. Please refer to the Current Medication list given to you today.  

## 2013-05-22 ENCOUNTER — Encounter: Payer: Self-pay | Admitting: Internal Medicine

## 2013-05-30 ENCOUNTER — Telehealth: Payer: Self-pay | Admitting: Internal Medicine

## 2013-05-30 NOTE — Telephone Encounter (Signed)
rec'd records 2 pages from [facility] forward to Dr. Tenny Craw

## 2013-07-25 ENCOUNTER — Ambulatory Visit (INDEPENDENT_AMBULATORY_CARE_PROVIDER_SITE_OTHER): Payer: Medicare Other | Admitting: Family Medicine

## 2013-07-25 ENCOUNTER — Encounter: Payer: Self-pay | Admitting: Family Medicine

## 2013-07-25 VITALS — BP 130/60 | HR 63 | Temp 98.0°F | Wt 128.0 lb

## 2013-07-25 DIAGNOSIS — N184 Chronic kidney disease, stage 4 (severe): Secondary | ICD-10-CM

## 2013-07-25 DIAGNOSIS — I129 Hypertensive chronic kidney disease with stage 1 through stage 4 chronic kidney disease, or unspecified chronic kidney disease: Secondary | ICD-10-CM

## 2013-07-25 DIAGNOSIS — I509 Heart failure, unspecified: Secondary | ICD-10-CM

## 2013-07-25 DIAGNOSIS — I4891 Unspecified atrial fibrillation: Secondary | ICD-10-CM

## 2013-07-25 DIAGNOSIS — I5042 Chronic combined systolic (congestive) and diastolic (congestive) heart failure: Secondary | ICD-10-CM

## 2013-07-25 DIAGNOSIS — I10 Essential (primary) hypertension: Secondary | ICD-10-CM

## 2013-07-25 NOTE — Patient Instructions (Addendum)
Remember flu vaccine this Fall.   

## 2013-07-25 NOTE — Progress Notes (Signed)
Subjective:    Patient ID: [name], female    DOB: 1929/12/07, 79 y.o.   MRN: [id number]  HPI Patient seen for medical followup She has history of CAD, atrial fibrillation, acute on chronic diastolic heart failure, hypertension, stage IV chronic kidney disease, and osteoporosis. Back in the spring, she had rash which was attributed to diltiazem. She was subsequently placed on prednisone and then developed acute respiratory failure with cardiogenic pulmonary edema. She's done very well since that time. She remains on furosemide 40 mg once daily. Weight has been stable and she weighs daily. She has been very cautious to watch her sodium intake. No increased dyspnea. No chest pains.  Most recent creatinine 2.7. No peripheral edema. No dizziness. Generally feels well. She's had good appetite.  Past Medical History  Diagnosis Date  . Lower extremity edema     bilateral  . CKD (chronic kidney disease), stage IV     a. baseline creat previously 1.6->2 - elevated during April 29, 2013 adm.  . Labile hypertension   . Anxiety   . Failure to thrive in childhood   . History of shingles July 2004    on the left side of her thorax and postherpetic neuralgia  . History of psoriasis   . History of recurrent UTIs   . Major depressive disorder, single episode, severe Apr 30, 2003    "when my husband died"  . Valvular heart disease     a.  11/2012 Echo: Mild AS/AI/MR;  b. 02/2013 Echo: EF 55-60%, mildly dil LA, calcified AV/MV.  Marland Kitchen CAD (coronary artery disease)     a. NSTEMI 05/2012 s/p DES to OM (residual severe LCx subbranch dz for med rx unless refractory angina recurs) b. NSTEMI 04/29/13 felt 2/2 demand in setting of resp failure, for med rx.  Marland Kitchen LBBB (left bundle branch block)     intermittent  . Chronic combined systolic and diastolic CHF (congestive heart failure)     a. 05/2012 Echo: EF 60-65%;  b. 07/2012 Echo in setting of afib: EF 35%;  c. 02/2013 Echo: EF 55-60%, mildly dil LA.  Marland Kitchen PAF (paroxysmal atrial  fibrillation)     a. Dx 07/2012 -> Amio started but later dc'd 2/2 nausea/NSR. b. 02/2013 Coumadin initiated, dc'd 04/29/2013 2/2 allergic reaction. c. Ultimately not on NOAC due to age, Coumadin allergy and low CrCl.  Marland Kitchen Respiratory failure     a. Requiring intubation April 29, 2013, secondary to cardiogenic pulmonary edema.   Past Surgical History  Procedure Laterality Date  . Salpingoophorectomy      "cyst removed"  . Appendectomy    . Breast cyst excision    . Abdominal hysterectomy    . Cataract extraction, bilateral  ~ April 29, 2009  . Cardiac surgery      reports that she quit smoking about 33 years ago. Her smoking use included Cigarettes. She has a 45 pack-year smoking history. She has never used smokeless tobacco. She reports that  drinks alcohol. She reports that she does not use illicit drugs. family history includes Cancer in an other family member; Diabetes in an other family member; Lung cancer in an other family member. Allergies  Allergen Reactions  . Metoprolol Swelling    Takes bisoprolol without issue  . Norvasc [Amlodipine Besylate] Swelling    Takes nisoldipine without issue  . Sulfonamide Derivatives Hives, Itching and Swelling  . Amiodarone Nausea And Vomiting  . Medrol [Methylprednisolone]     Fluid retention/CHF exacerbation - caution with steroids in future if ever needed  .  Statins     Myalgias - "I won't take them"  . Coumadin [Warfarin Sodium] Rash      Review of Systems  Constitutional: Negative for appetite change, fatigue and unexpected weight change.  Eyes: Negative for visual disturbance.  Respiratory: Negative for cough, chest tightness, shortness of breath and wheezing.   Cardiovascular: Negative for chest pain, palpitations and leg swelling.  Gastrointestinal: Negative for abdominal pain.  Endocrine: Negative for polydipsia and polyuria.  Genitourinary: Negative for dysuria.  Neurological: Negative for dizziness, seizures, syncope, weakness, light-headedness  and headaches.       Objective:   Physical Exam  Constitutional: She is oriented to person, place, and time. She appears well-developed and well-nourished. No distress.  HENT:  Mouth/Throat: Oropharynx is clear and moist.  Neck: Neck supple. No thyromegaly present.  Cardiovascular: Normal rate and regular rhythm.   Pulmonary/Chest: Effort normal and breath sounds normal. No respiratory distress. She has no wheezes. She has no rales.  Musculoskeletal: She exhibits no edema.  Neurological: She is alert and oriented to person, place, and time.  Psychiatric: She has a normal mood and affect. Her behavior is normal.          Assessment & Plan:  #1 history of acute on chronic diastolic heart failure. Stable clinically. Continue close monitoring of weights and monitoring sodium intake. #2 chronic kidney disease. Recheck basic metabolic panel #3 history of atrial fibrillation. Currently sinus rhythm. Avoidance of novel agents for anticoagulation in the past because of her chronic kidney disease. She remains on aspirin and Plavix

## 2013-07-26 LAB — BASIC METABOLIC PANEL
Calcium: 9.5 mg/dL (ref 8.4–10.5)
Chloride: 95 mEq/L — ABNORMAL LOW (ref 96–112)
Creatinine, Ser: 2.7 mg/dL — ABNORMAL HIGH (ref 0.4–1.2)
GFR: 18.26 mL/min — ABNORMAL LOW (ref >60.00)

## 2013-08-07 ENCOUNTER — Ambulatory Visit (INDEPENDENT_AMBULATORY_CARE_PROVIDER_SITE_OTHER): Payer: Medicare Other | Admitting: Cardiovascular Disease

## 2013-08-07 ENCOUNTER — Encounter: Payer: Self-pay | Admitting: Cardiovascular Disease

## 2013-08-07 VITALS — BP 132/66 | HR 60 | Ht 61.0 in | Wt 127.0 lb

## 2013-08-07 DIAGNOSIS — I4891 Unspecified atrial fibrillation: Secondary | ICD-10-CM

## 2013-08-07 DIAGNOSIS — I2510 Atherosclerotic heart disease of native coronary artery without angina pectoris: Secondary | ICD-10-CM

## 2013-08-07 DIAGNOSIS — R0989 Other specified symptoms and signs involving the circulatory and respiratory systems: Secondary | ICD-10-CM

## 2013-08-07 NOTE — Progress Notes (Signed)
HPI:   79 year old woman presenting for followup evaluation. The patient has atrial fibrillation, chronic diastolic heart failure, and coronary artery disease. She has undergone PCI of the left circumflex with a drug-eluting stent platform. She's had symptomatic atrial fibrillation with heart failure. The patient has been intubated on 2 separate occasions. She had a severe allergic skin reaction to warfarin. She's not been a candidate for novel anticoagulant drugs because of advanced chronic kidney disease. She is treated with aspirin and Plavix.  The patient is doing well. She maintained a garden this year. She denies chest pain or pressure. She has mild shortness of breath unchanged over time. She denies edema, palpitations, orthopnea, or PND. She's had no stroke or TIA symptoms. She's been compliant with her medications.  Outpatient Encounter Prescriptions as of 08/07/2013  Medication Sig Dispense Refill  . aspirin EC 81 MG tablet Take 1 tablet (81 mg total) by mouth daily.      Marland Kitchen b complex vitamins tablet Take 1 tablet by mouth daily.        . bisoprolol (ZEBETA) 5 MG tablet Take 1 tablet (5 mg total) by mouth daily.  30 tablet  6  . Cholecalciferol (VITAMIN D-3) 5000 UNITS TABS Take 1 tablet by mouth daily.      . clopidogrel (PLAVIX) 75 MG tablet Take 1 tablet (75 mg total) by mouth daily with breakfast.  30 tablet  6  . fish oil-omega-3 fatty acids 1000 MG capsule Take 1 g by mouth 2 (two) times daily.       . furosemide (LASIX) 40 MG tablet Take 1 tablet (40 mg total) by mouth daily.      . hydrOXYzine (ATARAX/VISTARIL) 25 MG tablet Take Daily, at night  30 tablet  0  . loratadine (CLARITIN) 10 MG tablet Take 1 tablet (10 mg total) by mouth every other day as needed for allergies (Take in AM (on days when taking)).      . nitroGLYCERIN (NITROSTAT) 0.4 MG SL tablet Place 1 tablet (0.4 mg total) under the tongue every 5 (five) minutes x 3 doses as needed for chest pain.  25 tablet  4  .  ranitidine (ZANTAC) 150 MG tablet Take 1 tablet (150 mg total) by mouth 2 (two) times daily.  60 tablet  3   No facility-administered encounter medications on file as of 08/07/2013.    Allergies  Allergen Reactions  . Metoprolol Swelling    Takes bisoprolol without issue  . Norvasc [Amlodipine Besylate] Swelling    Takes nisoldipine without issue  . Sulfonamide Derivatives Hives, Itching and Swelling  . Amiodarone Nausea And Vomiting  . Medrol [Methylprednisolone]     Fluid retention/CHF exacerbation - caution with steroids in future if ever needed  . Statins     Myalgias - "I won't take them"  . Coumadin [Warfarin Sodium] Rash    Past Medical History  Diagnosis Date  . Lower extremity edema     bilateral  . CKD (chronic kidney disease), stage IV     a. baseline creat previously 1.6->2 - elevated during May 05, 2013 adm.  . Labile hypertension   . Anxiety   . Failure to thrive in childhood   . History of shingles July 2004    on the left side of her thorax and postherpetic neuralgia  . History of psoriasis   . History of recurrent UTIs   . Major depressive disorder, single episode, severe 05-06-2003    "when my husband died"  . Valvular heart  disease     a.  11/2012 Echo: Mild AS/AI/MR;  b. 02/2013 Echo: EF 55-60%, mildly dil LA, calcified AV/MV.  Marland Kitchen CAD (coronary artery disease)     a. NSTEMI 05/2012 s/p DES to OM (residual severe LCx subbranch dz for med rx unless refractory angina recurs) b. NSTEMI 03/2013 felt 2/2 demand in setting of resp failure, for med rx.  Marland Kitchen LBBB (left bundle branch block)     intermittent  . Chronic combined systolic and diastolic CHF (congestive heart failure)     a. 05/2012 Echo: EF 60-65%;  b. 07/2012 Echo in setting of afib: EF 35%;  c. 02/2013 Echo: EF 55-60%, mildly dil LA.  Marland Kitchen PAF (paroxysmal atrial fibrillation)     a. Dx 07/2012 -> Amio started but later dc'd 2/2 nausea/NSR. b. 02/2013 Coumadin initiated, dc'd 03/2013 2/2 allergic reaction. c. Ultimately not on  NOAC due to age, Coumadin allergy and low CrCl.  Marland Kitchen Respiratory failure     a. Requiring intubation 03/2013, secondary to cardiogenic pulmonary edema.    ROS: Negative except as per HPI  BP 132/66  Pulse 60  Ht 5\' 1"  (1.549 m)  Wt 127 lb (57.607 kg)  BMI 24.01 kg/m2  SpO2 98%  PHYSICAL EXAM: Pt is alert and oriented, pleasant elderly woman in NAD HEENT: normal Neck: JVP - normal, carotids 2+= without bruits Lungs: CTA bilaterally CV: RRR with grade 2/6 systolic murmur at the left sternal border Abd: soft, NT, Positive BS, no hepatomegaly Ext: no C/C/E, distal pulses intact and equal Skin: warm/dry no rash  EKG:  Normal sinus rhythm with left bundle branch block, heart rate 60 beats per minute.  ASSESSMENT AND PLAN: 1. Coronary artery disease, native vessel. The patient is stable without anginal symptoms. Will continue her current medical program.  2. Paroxysmal atrial fibrillation. Fortunately she is maintaining sinus rhythm and seems to be doing well. She is allergic to warfarin and we have had previous discussions about multiple anticoagulant drugs. She understands there is limited data in a patient population with advanced chronic kidney disease. She prefers avoiding these medications in understands the associated increased risk of stroke. She will remain on aspirin and Plavix.  3. Hypertension. Blood pressure is well controlled.  4. Chronic kidney disease, stage IV. Recent creatinine was 2.7 mg/dL which is stable (GFR 18).  Tonny Bollman 08/15/2013 4:32 AM

## 2013-08-07 NOTE — Patient Instructions (Addendum)
Your physician has requested that you have a carotid duplex. This test is an ultrasound of the carotid arteries in your neck. It looks at blood flow through these arteries that supply the brain with blood. Allow one hour for this exam. There are no restrictions or special instructions.  Your physician wants you to follow-up in: 6 months. You will receive a reminder letter in the mail two months in advance. If you don't receive a letter, please call our office to schedule the follow-up appointment.  Your physician recommends that you continue on your current medications as directed. Please refer to the Current Medication list given to you today.   

## 2013-08-14 ENCOUNTER — Encounter (INDEPENDENT_AMBULATORY_CARE_PROVIDER_SITE_OTHER): Payer: Medicare Other

## 2013-08-14 DIAGNOSIS — R0989 Other specified symptoms and signs involving the circulatory and respiratory systems: Secondary | ICD-10-CM

## 2013-08-14 DIAGNOSIS — I4891 Unspecified atrial fibrillation: Secondary | ICD-10-CM

## 2013-08-14 DIAGNOSIS — I2510 Atherosclerotic heart disease of native coronary artery without angina pectoris: Secondary | ICD-10-CM

## 2013-08-14 DIAGNOSIS — I6529 Occlusion and stenosis of unspecified carotid artery: Secondary | ICD-10-CM

## 2013-08-15 ENCOUNTER — Encounter: Payer: Self-pay | Admitting: Cardiovascular Disease

## 2013-08-23 ENCOUNTER — Encounter: Payer: Medicare Other | Admitting: Internal Medicine

## 2013-09-09 ENCOUNTER — Encounter: Payer: Self-pay | Admitting: Family Medicine

## 2013-09-09 ENCOUNTER — Ambulatory Visit (INDEPENDENT_AMBULATORY_CARE_PROVIDER_SITE_OTHER): Payer: Medicare Other | Admitting: Family Medicine

## 2013-09-09 VITALS — BP 130/70 | HR 84 | Temp 98.0°F | Wt 129.0 lb

## 2013-09-09 DIAGNOSIS — N184 Chronic kidney disease, stage 4 (severe): Secondary | ICD-10-CM

## 2013-09-09 DIAGNOSIS — M7989 Other specified soft tissue disorders: Secondary | ICD-10-CM

## 2013-09-09 DIAGNOSIS — N189 Chronic kidney disease, unspecified: Secondary | ICD-10-CM

## 2013-09-09 DIAGNOSIS — I509 Heart failure, unspecified: Secondary | ICD-10-CM

## 2013-09-09 DIAGNOSIS — I5043 Acute on chronic combined systolic (congestive) and diastolic (congestive) heart failure: Secondary | ICD-10-CM

## 2013-09-09 DIAGNOSIS — I2510 Atherosclerotic heart disease of native coronary artery without angina pectoris: Secondary | ICD-10-CM

## 2013-09-09 DIAGNOSIS — Z23 Encounter for immunization: Secondary | ICD-10-CM

## 2013-09-09 MED ORDER — FUROSEMIDE 40 MG PO TABS: 40.0000 mg | ORAL_TABLET | Freq: Every day | ORAL | Status: DC

## 2013-09-09 NOTE — Progress Notes (Signed)
Subjective:    Patient ID: [name], female    DOB: 1930/10/01, 79 y.o.   MRN: [id number]  HPI Medical followup. She has history of CAD, history of systolic and diastolic heart failure, atrial fibrillation, hypertension, hyperlipidemia, chronic kidney disease, osteoporosis. She's had previous allergic reaction to Coumadin. Recently has been in sinus rhythm and currently remains on aspirin and Plavix. It is not clear she is taking her aspirin regularly.  She takes furosemide 40 mg one daily and occasionally supplements with an extra dose if her weight has gone up more than 3 pounds. No recent dyspnea. No orthostasis. No syncope. No chest pains.  Needs flu vaccine. She is compliant with all medications.  Past Medical History  Diagnosis Date  . Lower extremity edema     bilateral  . CKD (chronic kidney disease), stage IV     a. baseline creat previously 1.6->2 - elevated during May 01, 2013 adm.  . Labile hypertension   . Anxiety   . Failure to thrive in childhood   . History of shingles July 2004    on the left side of her thorax and postherpetic neuralgia  . History of psoriasis   . History of recurrent UTIs   . Major depressive disorder, single episode, severe May 02, 2003    "when my husband died"  . Valvular heart disease     a.  11/2012 Echo: Mild AS/AI/MR;  b. 02/2013 Echo: EF 55-60%, mildly dil LA, calcified AV/MV.  Marland Kitchen CAD (coronary artery disease)     a. NSTEMI 05/2012 s/p DES to OM (residual severe LCx subbranch dz for med rx unless refractory angina recurs) b. NSTEMI 2013-05-01 felt 2/2 demand in setting of resp failure, for med rx.  Marland Kitchen LBBB (left bundle branch block)     intermittent  . Chronic combined systolic and diastolic CHF (congestive heart failure)     a. 05/2012 Echo: EF 60-65%;  b. 07/2012 Echo in setting of afib: EF 35%;  c. 02/2013 Echo: EF 55-60%, mildly dil LA.  Marland Kitchen PAF (paroxysmal atrial fibrillation)     a. Dx 07/2012 -> Amio started but later dc'd 2/2 nausea/NSR. b. 02/2013  Coumadin initiated, dc'd 05/01/2013 2/2 allergic reaction. c. Ultimately not on NOAC due to age, Coumadin allergy and low CrCl.  Marland Kitchen Respiratory failure     a. Requiring intubation 01-May-2013, secondary to cardiogenic pulmonary edema.   Past Surgical History  Procedure Laterality Date  . Salpingoophorectomy      "cyst removed"  . Appendectomy    . Breast cyst excision    . Abdominal hysterectomy    . Cataract extraction, bilateral  ~ May 01, 2009  . Cardiac surgery      reports that she quit smoking about 34 years ago. Her smoking use included Cigarettes. She has a 45 pack-year smoking history. She has never used smokeless tobacco. She reports that she drinks alcohol. She reports that she does not use illicit drugs. family history includes Cancer in an other family member; Diabetes in an other family member; Lung cancer in an other family member. Allergies  Allergen Reactions  . Metoprolol Swelling    Takes bisoprolol without issue  . Norvasc [Amlodipine Besylate] Swelling    Takes nisoldipine without issue  . Sulfonamide Derivatives Hives, Itching and Swelling  . Amiodarone Nausea And Vomiting  . Medrol [Methylprednisolone]     Fluid retention/CHF exacerbation - caution with steroids in future if ever needed  . Statins     Myalgias - "I won't take them"  .  Coumadin [Warfarin Sodium] Rash      Review of Systems  Constitutional: Negative for fever, chills, fatigue and unexpected weight change.  Eyes: Negative for visual disturbance.  Respiratory: Negative for cough, chest tightness, shortness of breath and wheezing.   Cardiovascular: Negative for chest pain, palpitations and leg swelling.  Endocrine: Negative for polydipsia and polyuria.  Genitourinary: Negative for dysuria.  Neurological: Negative for dizziness, seizures, syncope, weakness, light-headedness and headaches.       Objective:   Physical Exam  Constitutional: She appears well-developed and well-nourished.  HENT:  Right Ear:  External ear normal.  Left Ear: External ear normal.  Neck: Neck supple.  Cardiovascular: Normal rate and regular rhythm.   Pulmonary/Chest: Effort normal and breath sounds normal. No respiratory distress. She has no wheezes. She has no rales.  Musculoskeletal: She exhibits no edema.  Neurological: She is alert.          Assessment & Plan:  #1 history of atrial fibrillation. Currently sinus rhythm. #2 history of systolic and diastolic heart failure. Weight stable. No peripheral edema. Continue daily weights. Refill furosemide. Check basic metabolic panel at follow up #3 chronic kidney disease with GFR around 18. Repeat at followup visit. #4 health maintenance. Flu vaccine given

## 2013-09-26 ENCOUNTER — Other Ambulatory Visit: Payer: Self-pay | Admitting: Physician Assistant

## 2013-10-22 ENCOUNTER — Other Ambulatory Visit (HOSPITAL_COMMUNITY): Payer: Self-pay | Admitting: Physician Assistant

## 2013-11-02 ENCOUNTER — Other Ambulatory Visit (HOSPITAL_COMMUNITY): Payer: Self-pay | Admitting: Physician Assistant

## 2013-11-21 ENCOUNTER — Other Ambulatory Visit (HOSPITAL_COMMUNITY): Payer: Self-pay | Admitting: Cardiovascular Disease

## 2013-11-24 IMAGING — CR DG CHEST 1V PORT
1 series · 1 of 1 positions shown · non-contrast
Comparison: 06/06/2012 and 02/14/2012.

CLINICAL DATA: Shortness of breath.

PORTABLE CHEST - 1 VIEW

[view not recorded]
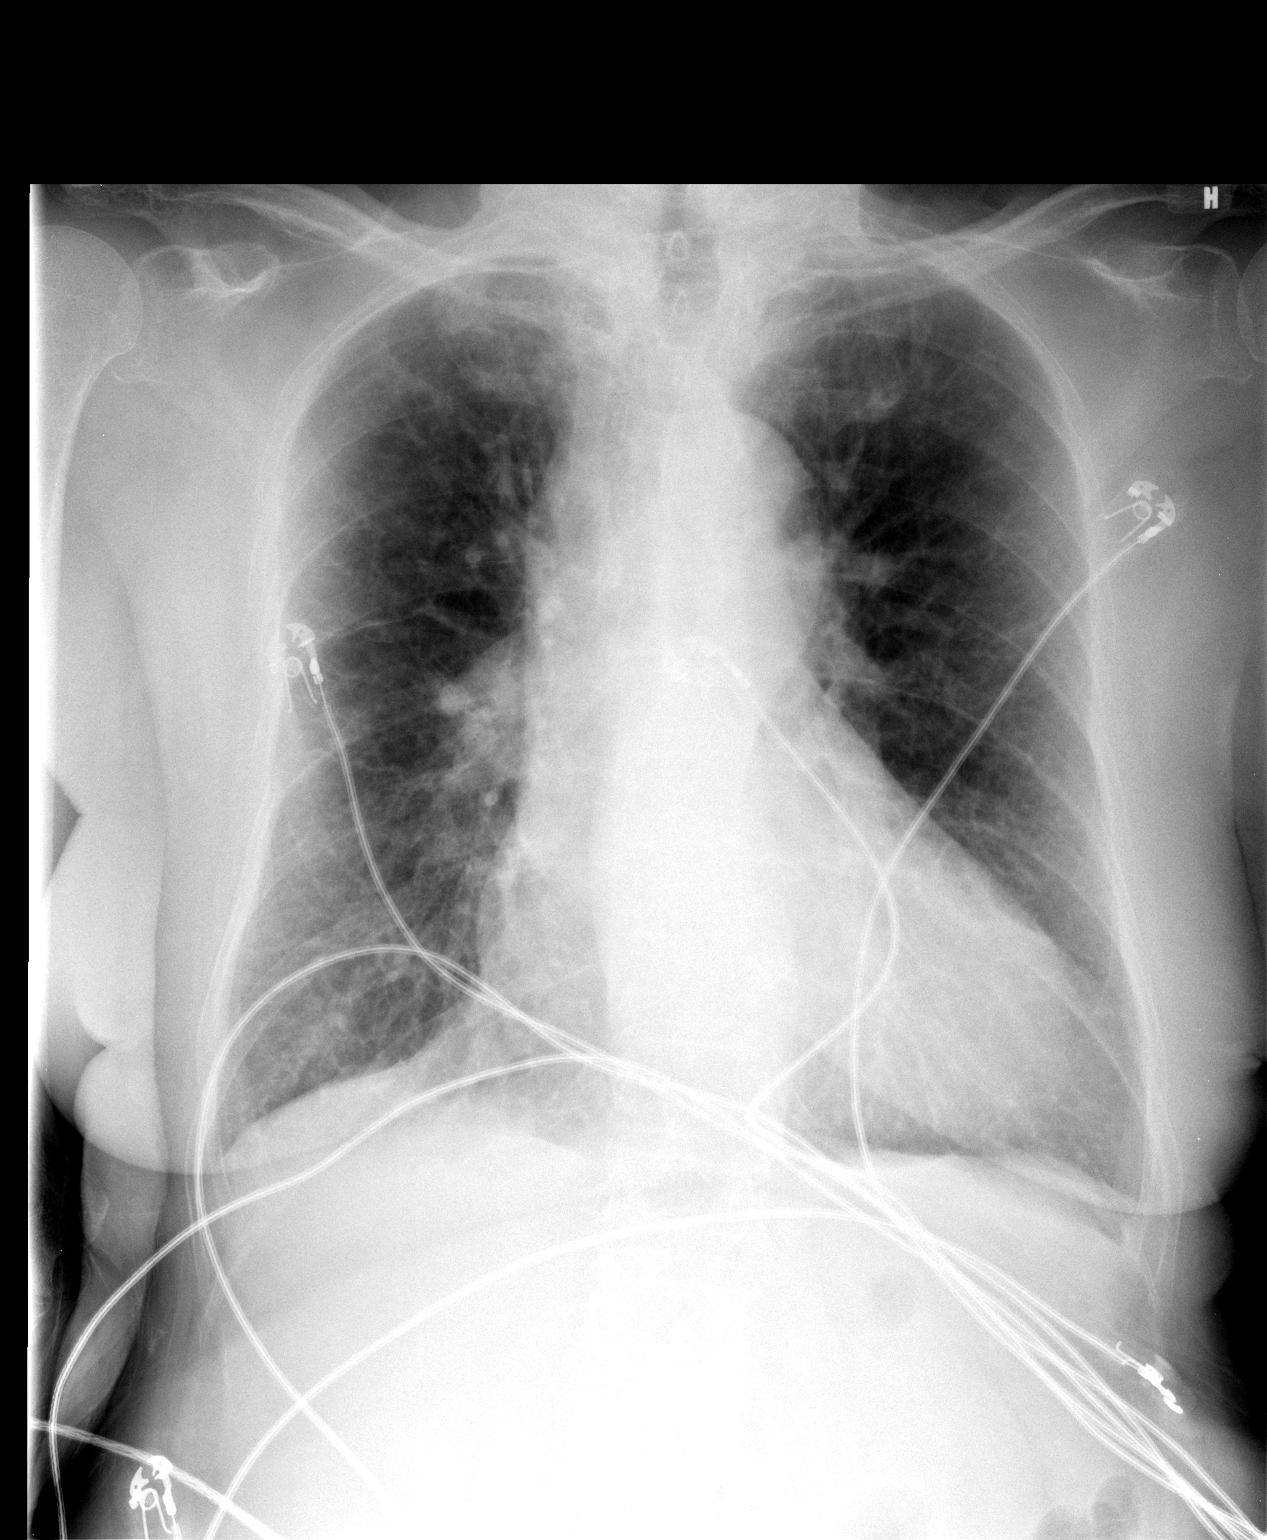

[1 of 1 positions shown; findings below may reference images not displayed]

FINDINGS: 7172 hours.  Cardiomegaly and aortic tortuosity appear
stable.  There is stable biapical pleural parenchymal scarring.  No
superimposed airspace disease, edema or pleural effusion is seen.
Osseous structures appear unchanged.  Telemetry leads overlie the
chest.
IMPRESSION: Stable cardiomegaly and chronic biapical lung disease.  No acute
cardiopulmonary process.

## 2014-01-24 ENCOUNTER — Ambulatory Visit: Payer: Medicare Other | Admitting: Family Medicine

## 2014-01-24 DIAGNOSIS — Z0289 Encounter for other administrative examinations: Secondary | ICD-10-CM

## 2014-02-07 ENCOUNTER — Encounter: Payer: Self-pay | Admitting: Family Medicine

## 2014-02-07 ENCOUNTER — Other Ambulatory Visit: Payer: Self-pay

## 2014-02-07 ENCOUNTER — Ambulatory Visit (INDEPENDENT_AMBULATORY_CARE_PROVIDER_SITE_OTHER): Payer: Medicare Other | Admitting: Family Medicine

## 2014-02-07 VITALS — BP 132/68 | HR 63 | Wt 129.0 lb

## 2014-02-07 DIAGNOSIS — R05 Cough: Secondary | ICD-10-CM

## 2014-02-07 DIAGNOSIS — R059 Cough, unspecified: Secondary | ICD-10-CM

## 2014-02-07 DIAGNOSIS — I10 Essential (primary) hypertension: Secondary | ICD-10-CM

## 2014-02-07 DIAGNOSIS — N189 Chronic kidney disease, unspecified: Secondary | ICD-10-CM

## 2014-02-07 MED ORDER — BENZONATATE 100 MG PO CAPS: 100.0000 mg | ORAL_CAPSULE | Freq: Three times a day (TID) | ORAL | Status: DC | PRN

## 2014-02-07 MED ORDER — NITROGLYCERIN 0.4 MG SL SUBL
0.4000 mg | SUBLINGUAL_TABLET | SUBLINGUAL | Status: AC | PRN
Start: 2014-02-07 — End: 2015-10-09

## 2014-02-07 MED ORDER — NITROGLYCERIN 0.4 MG SL SUBL: 0.4000 mg | SUBLINGUAL_TABLET | SUBLINGUAL | Status: DC | PRN

## 2014-02-07 NOTE — Progress Notes (Signed)
Subjective:    Patient ID: [name], female    DOB: 05/13/1930, 80 y.o.   MRN: [id number]  HPI Medical followup. She has history of CAD, diastolic heart failure, atrial fibrillation, chronic kidney disease, hypertension, osteoporosis. She is followed by nephrologist and has appointment with them next week. She plans to get lab work then. She does mild normocytic anemia likely related to her chronic kidney disease. Her weight has been stable and she weighs herself daily. She remains on Lasix 40 mg daily. 6 Plavix. She has not taken anticoagulants with prior allergic reaction with Coumadin and not taking novel new agents because of her chronic kidney disease. Baseline creatinine around 2.7  Main issue currently is cough for 3 weeks. Mostly dry. No fever. No increased dyspnea. No wheezing. Nonsmoker.  Past Medical History  Diagnosis Date  . Lower extremity edema     bilateral  . CKD (chronic kidney disease), stage IV     a. baseline creat previously 1.6->2 - elevated during 03/2013 adm.  . Labile hypertension   . Anxiety   . Failure to thrive in childhood   . History of shingles July 2004    on the left side of her thorax and postherpetic neuralgia  . History of psoriasis   . History of recurrent UTIs   . Major depressive disorder, single episode, severe 2004    "when my husband died"  . Valvular heart disease     a.  11/2012 Echo: Mild AS/AI/MR;  b. 02/2013 Echo: EF 55-60%, mildly dil LA, calcified AV/MV.  Marland Kitchen. CAD (coronary artery disease)     a. NSTEMI 05/2012 s/p DES to OM (residual severe LCx subbranch dz for med rx unless refractory angina recurs) b. NSTEMI 03/2013 felt 2/2 demand in setting of resp failure, for med rx.  Marland Kitchen. LBBB (left bundle branch block)     intermittent  . Chronic combined systolic and diastolic CHF (congestive heart failure)     a. 05/2012 Echo: EF 60-65%;  b. 07/2012 Echo in setting of afib: EF 35%;  c. 02/2013 Echo: EF 55-60%, mildly dil LA.  Marland Kitchen. PAF (paroxysmal  atrial fibrillation)     a. Dx 07/2012 -> Amio started but later dc'd 2/2 nausea/NSR. b. 02/2013 Coumadin initiated, dc'd 03/2013 2/2 allergic reaction. c. Ultimately not on NOAC due to age, Coumadin allergy and low CrCl.  Marland Kitchen. Respiratory failure     a. Requiring intubation 03/2013, secondary to cardiogenic pulmonary edema.   Past Surgical History  Procedure Laterality Date  . Salpingoophorectomy      "cyst removed"  . Appendectomy    . Breast cyst excision    . Abdominal hysterectomy    . Cataract extraction, bilateral  ~ 2010  . Cardiac surgery      reports that she quit smoking about 34 years ago. Her smoking use included Cigarettes. She has a 45 pack-year smoking history. She has never used smokeless tobacco. She reports that she drinks alcohol. She reports that she does not use illicit drugs. family history includes Cancer in an other family member; Diabetes in an other family member; Lung cancer in an other family member. Allergies  Allergen Reactions  . Metoprolol Swelling    Takes bisoprolol without issue  . Norvasc [Amlodipine Besylate] Swelling    Takes nisoldipine without issue  . Sulfonamide Derivatives Hives, Itching and Swelling  . Amiodarone Nausea And Vomiting  . Medrol [Methylprednisolone]     Fluid retention/CHF exacerbation - caution with steroids in future  if ever needed  . Statins     Myalgias - "I won't take them"  . Coumadin [Warfarin Sodium] Rash      Review of Systems  Constitutional: Negative for fever, chills and unexpected weight change.  HENT: Negative for congestion.   Respiratory: Positive for cough. Negative for shortness of breath and wheezing.   Cardiovascular: Negative for chest pain and leg swelling.  Endocrine: Negative for polydipsia and polyuria.       Objective:   Physical Exam  Constitutional: She is oriented to person, place, and time. She appears well-developed and well-nourished.  HENT:  Right Ear: External ear normal.  Left Ear:  External ear normal.  Mouth/Throat: Oropharynx is clear and moist.  Neck: Neck supple.  Cardiovascular: Normal rate and regular rhythm.   Pulmonary/Chest: Effort normal and breath sounds normal. No respiratory distress. She has no wheezes. She has no rales.  Musculoskeletal: She exhibits no edema.  Lymphadenopathy:    She has no cervical adenopathy.  Neurological: She is alert and oriented to person, place, and time.          Assessment & Plan:  #1 chronic diastolic heart failure. Weight is stable and lung exam normal. Symptomatically stable. Continue close monitoring. Continue furosemide 40 mg daily. #2 chronic kidney disease. Followed by nephrology. We will defer labs and states she sees them next week and they plan to do labs at that point #3 cough probably related to recent viral bronchitis. Nonfocal exam. Tessalon 100 mg every 8 hours as needed for severe cough.

## 2014-02-07 NOTE — Progress Notes (Signed)
Pre visit review using our clinic review tool, if applicable. No additional management support is needed unless otherwise documented below in the visit note. 

## 2014-02-10 ENCOUNTER — Telehealth: Payer: Self-pay | Admitting: Family Medicine

## 2014-02-10 NOTE — Telephone Encounter (Signed)
Relevant patient education mailed to patient.  

## 2014-03-07 ENCOUNTER — Other Ambulatory Visit (HOSPITAL_COMMUNITY): Payer: Self-pay | Admitting: Cardiovascular Disease

## 2014-03-07 ENCOUNTER — Other Ambulatory Visit: Payer: Self-pay | Admitting: Family Medicine

## 2014-04-16 ENCOUNTER — Telehealth: Payer: Self-pay | Admitting: Cardiovascular Disease

## 2014-04-16 NOTE — Telephone Encounter (Signed)
I spoke with the pt and have scheduled her to see Norma FredricksonLori Gerhardt NP on 04/17/14.

## 2014-04-16 NOTE — Telephone Encounter (Signed)
I spoke with the pt and she is complaining of symptoms occuring on a nightly basis.    Saturday night the pt a severe aching in both arms and her neck.  The pt took a baby ASA and this did not help.  She took a NTG and 15-20 minutes later her symptoms resolved. Sunday night her arms hurt just a little but not bad enough to take a NTG. Monday night the pt had a burning sensation in her throat and upper chest, aching in arms and neck.  The pt took a NTG and 15-20 minutes later her symptoms resolved.  Tuesday night the pt was awoken from her sleep with her arms aching and throat burning. The pt took NTG and again symptoms resolved 15-20 minutes later.  The pt denies chest burning at this time.  The pt said she has also noticed a worsening in her SOB and she is more easily fatigued during the day.

## 2014-04-16 NOTE — Telephone Encounter (Signed)
New problem    Per pt she has take 3 nitro's since Sat. Because of her arm pain in both arms, last night was the last time she took a Nitrog.

## 2014-04-17 ENCOUNTER — Ambulatory Visit (INDEPENDENT_AMBULATORY_CARE_PROVIDER_SITE_OTHER): Payer: Medicare Other | Admitting: Nurse Practitioner

## 2014-04-17 ENCOUNTER — Encounter: Payer: Self-pay | Admitting: Nurse Practitioner

## 2014-04-17 VITALS — BP 170/80 | HR 63 | Ht 61.0 in | Wt 127.4 lb

## 2014-04-17 DIAGNOSIS — J9600 Acute respiratory failure, unspecified whether with hypoxia or hypercapnia: Secondary | ICD-10-CM

## 2014-04-17 DIAGNOSIS — J9601 Acute respiratory failure with hypoxia: Secondary | ICD-10-CM

## 2014-04-17 DIAGNOSIS — I2510 Atherosclerotic heart disease of native coronary artery without angina pectoris: Secondary | ICD-10-CM

## 2014-04-17 DIAGNOSIS — R079 Chest pain, unspecified: Secondary | ICD-10-CM

## 2014-04-17 DIAGNOSIS — I10 Essential (primary) hypertension: Secondary | ICD-10-CM

## 2014-04-17 LAB — BASIC METABOLIC PANEL
BUN: 45 mg/dL — ABNORMAL HIGH (ref 6–23)
CO2: 27 mEq/L (ref 19–32)
Calcium: 9.5 mg/dL (ref 8.4–10.5)
Chloride: 97 mEq/L (ref 96–112)
Creatinine, Ser: 2.5 mg/dL — ABNORMAL HIGH (ref 0.4–1.2)
GFR: 19.23 mL/min — ABNORMAL LOW (ref >60.00)
Glucose, Bld: 94 mg/dL (ref 70–99)
Potassium: 3.7 mEq/L (ref 3.5–5.1)
Sodium: 135 mEq/L (ref 135–145)

## 2014-04-17 LAB — CBC
HCT: 30.5 % — ABNORMAL LOW (ref 36.0–46.0)
Hemoglobin: 10.2 g/dL — ABNORMAL LOW (ref 12.0–15.0)
MCHC: 33.4 g/dL (ref 30.0–36.0)
MCV: 90.3 fl (ref 78.0–100.0)
Platelets: 324 K/uL (ref 150.0–400.0)
RBC: 3.37 Mil/uL — ABNORMAL LOW (ref 3.87–5.11)
RDW: 13.2 % (ref 11.5–15.5)
WBC: 7.6 K/uL (ref 4.0–10.5)

## 2014-04-17 MED ORDER — ISOSORBIDE MONONITRATE ER 30 MG PO TB24: 30.0000 mg | ORAL_TABLET | Freq: Every day | ORAL | Status: DC

## 2014-04-17 MED ORDER — HYDRALAZINE HCL 25 MG PO TABS: 25.0000 mg | ORAL_TABLET | Freq: Two times a day (BID) | ORAL | Status: DC

## 2014-04-17 NOTE — Progress Notes (Signed)
[name] Date of Birth: 03-05-1930 Medical Record #[id number]  History of Present Illness: [name] is seen back today for a work in visit. Seen for Dr. Excell Seltzer. She is an 80 year old female with AF, chronic diastolic HF, and CAD. She has had PCI of the LCX with a DES platform after NSTEMI in 2013. She has had symptomatic AF with associated HF in the past - has been intubated on 2 occasions. She is allergic to coumadin due to severe skin reaction. Not a candidate for NOVAC due to CKD. She has been treated with aspirin and Plavix. She has a chronic LBBB on EKG. Known valvular heart disease as well.   Last seen here back in September. Felt to be doing ok.  Called yesterday with reports of chest pain at night. Had had severe aching in her arms and neck. Has taken aspirin and NTG for the last 3 nights. She will wake up with a discomfort in her throat associated with shortness of breath.  Comes in today. Here with her daughter. She has had 3 nights - Saturday, Monday, Tuesday - of waking up with burning down her arms and up to her neck/throat. Did have this back in the early spring while trying to push mow her yard - she has since stopped this. Used aspirin the first night, then NTG x 2 for the other 2 spells. NTG did help. BP running up. She has associated shortness of breath. Saw Dr. Hyman Hopes in February/March and will not see him again for another year.    Current Outpatient Prescriptions  Medication Sig Dispense Refill  . aspirin EC 81 MG tablet Take 1 tablet (81 mg total) by mouth daily.      Marland Kitchen b complex vitamins tablet Take 1 tablet by mouth daily.        . benzonatate (TESSALON) 100 MG capsule Take 1 capsule (100 mg total) by mouth 3 (three) times daily as needed for cough.  30 capsule  0  . bisoprolol (ZEBETA) 5 MG tablet TAKE 1 TABLET BY MOUTH DAILY  30 tablet  2  . Cholecalciferol (VITAMIN D-3) 5000 UNITS TABS Take 1 tablet by mouth daily.      . clopidogrel (PLAVIX) 75 MG tablet  Take 1 tablet (75 mg total) by mouth daily with breakfast.  30 tablet  6  . fish oil-omega-3 fatty acids 1000 MG capsule Take 1 g by mouth 2 (two) times daily.       . furosemide (LASIX) 40 MG tablet TAKE 1 TABLET BY MOUTH DAILY, take extra for weight gain of 3 lbs in 24 hours      . loratadine (CLARITIN) 10 MG tablet Take 1 tablet (10 mg total) by mouth every other day as needed for allergies (Take in AM (on days when taking)).      . nitroGLYCERIN (NITROSTAT) 0.4 MG SL tablet Place 1 tablet (0.4 mg total) under the tongue every 5 (five) minutes x 3 doses as needed for chest pain.  25 tablet  4   No current facility-administered medications for this visit.    Allergies  Allergen Reactions  . Metoprolol Swelling    Takes bisoprolol without issue  . Norvasc [Amlodipine Besylate] Swelling    Takes nisoldipine without issue  . Sulfonamide Derivatives Hives, Itching and Swelling  . Amiodarone Nausea And Vomiting  . Medrol [Methylprednisolone]     Fluid retention/CHF exacerbation - caution with steroids in future if ever needed  . Statins  Myalgias - "I won't take them"  . Coumadin [Warfarin Sodium] Rash    Past Medical History  Diagnosis Date  . Lower extremity edema     bilateral  . CKD (chronic kidney disease), stage IV     a. baseline creat previously 1.6->2 - elevated during 03/2013 adm.  . Labile hypertension   . Anxiety   . Failure to thrive in childhood   . History of shingles July 2004    on the left side of her thorax and postherpetic neuralgia  . History of psoriasis   . History of recurrent UTIs   . Major depressive disorder, single episode, severe 2004    "when my husband died"  . Valvular heart disease     a.  11/2012 Echo: Mild AS/AI/MR;  b. 02/2013 Echo: EF 55-60%, mildly dil LA, calcified AV/MV.  Marland Kitchen. CAD (coronary artery disease)     a. NSTEMI 05/2012 s/p DES to OM (residual severe LCx subbranch dz for med rx unless refractory angina recurs) b. NSTEMI 03/2013 felt  2/2 demand in setting of resp failure, for med rx.  Marland Kitchen. LBBB (left bundle branch block)     intermittent  . Chronic combined systolic and diastolic CHF (congestive heart failure)     a. 05/2012 Echo: EF 60-65%;  b. 07/2012 Echo in setting of afib: EF 35%;  c. 02/2013 Echo: EF 55-60%, mildly dil LA.  Marland Kitchen. PAF (paroxysmal atrial fibrillation)     a. Dx 07/2012 -> Amio started but later dc'd 2/2 nausea/NSR. b. 02/2013 Coumadin initiated, dc'd 03/2013 2/2 allergic reaction. c. Ultimately not on NOAC due to age, Coumadin allergy and low CrCl.  Marland Kitchen. Respiratory failure     a. Requiring intubation 03/2013, secondary to cardiogenic pulmonary edema.    Past Surgical History  Procedure Laterality Date  . Salpingoophorectomy      "cyst removed"  . Appendectomy    . Breast cyst excision    . Abdominal hysterectomy    . Cataract extraction, bilateral  ~ 2010  . Cardiac surgery      History  Smoking status  . Former Smoker -- 1.50 packs/day for 30 years  . Types: Cigarettes  . Quit date: 09/01/1979  Smokeless tobacco  . Never Used    History  Alcohol Use  . Yes    Comment: 06/06/12 "mixed drink once or twice a year.to celebrate"    Family History  Problem Relation Age of Onset  . Diabetes    . Cancer    . Lung cancer      Review of Systems: The review of systems is per the HPI.  All other systems were reviewed and are negative.  Physical Exam: BP 170/80  Pulse 63  Ht 5\' 1"  (1.549 m)  Wt 127 lb 6.4 oz (57.788 kg)  BMI 24.08 kg/m2 Patient is very pleasant and in no acute distress. Skin is warm and dry. Color is normal.  HEENT is unremarkable. Normocephalic/atraumatic. PERRL. Sclera are nonicteric. Neck is supple. No masses. No JVD. Lungs are clear. Cardiac exam shows a regular rate and rhythm. She has a high pitched outflow murmur. Abdomen is soft. Extremities are without edema. Gait and ROM are intact. No gross neurologic deficits noted.  LABORATORY DATA: EKG with sinus with LBBB   Lab  Results  Component Value Date   WBC 11.3* 04/10/2013   HGB 11.8* 04/10/2013   HCT 35.5* 04/10/2013   PLT 278 04/10/2013   GLUCOSE 103* 07/25/2013   CHOL 223* 03/16/2013  TRIG 119 03/16/2013   HDL 59 03/16/2013   LDLCALC 140* 03/16/2013   ALT 21 04/08/2013   AST 38* 04/08/2013   NA 135 07/25/2013   K 3.9 07/25/2013   CL 95* 07/25/2013   CREATININE 2.7* 07/25/2013   BUN 40* 07/25/2013   CO2 28 07/25/2013   TSH 1.429 03/15/2013   INR 2.43* 04/08/2013    Echo Study Conclusions from 02/2013  - Left ventricle: The cavity size was normal. There appears to be septal-lateral dyssynchrony. Systolic function was normal. The estimated ejection fraction was in the range of 55% to 60%. - Aortic valve: Trileaflet; moderately calcified leaflets. - Mitral valve: Moderately calcified annulus. - Left atrium: The atrium was mildly dilated. - Right ventricle: The cavity size was normal. Systolic function was normal. - Right atrium: Eustachian valve noted. - Atrial septum: Lipomatous atrial septal hypertrophy. Impressions:  - Limited echo for RA mass. Prominent Eustachian valve noted. Also lipomatous atrial septal hypertrophy. No RA Mass.     Coronary angiography:  Coronary dominance: right  Left mainstem: The left mainstem is patent. There is mild irregularity without significant stenosis. The left main is short.  Left anterior descending (LAD): The LAD courses to the left ventricular apex. There is diffuse proximal calcification. The ostium of the LAD has 20-30% stenosis. The proximal vessel has scattered irregularities throughout. The mid vessel has 50% stenosis. The distal vessel as it wraps around the apex is widely patent there were 2 diagonal branches arising from the LAD. The first is an 80% ostial stenosis and this is a very small diagonal. The second diagonal is of moderate caliber and is patent throughout.  Left circumflex (LCx): The left circumflex is tortuous. The vessel is moderately calcified.  It supplies a large first obtuse marginal branch that then divides into twin branches. The proximal aspect of the marginal branch has a 95% stenosis present. This lesion is very focal in nature. The AV groove circumflex beyond the marginal branch is small. As the mid AV groove circumflex arises from the marginal there is a 70% ostial stenosis. It then gives off 2 small branch vessels, the first of which has a 95% stenosis in the second of which is widely patent. Both of these vessels are less than 2 mm in diameter.  Right coronary artery (RCA): The right coronary cusp is calcified. The RCA is patent throughout. There are diffuse irregularities and mild calcification. There are no high-grade stenoses present. The vessel gives off a single PDA and a single posterolateral branch.  Left ventriculography: Deferred because of chronic kidney disease  PCI Note: Following the diagnostic procedure, the decision was made to proceed with PCI. The radial sheath was upsized to a 6 [place]. Weight-based bivalirudin was given for anticoagulation. The patient was loaded with 600 mg of Plavix on the table. Once a therapeutic ACT was achieved, a 6 [place] XB LAD 3.5 cm guide catheter was inserted. A cougar coronary guidewire was used to cross the lesion. The obtuse marginal branch was tortuous and the wire was moderately difficult to advance The lesion was predilated with a 2.5 x 15 mm balloon. I then attempted to advance a 3.5 x 12 mm Xience Xpedition DES. However, the stent would not cross the lesion. This appeared to be due to vessel tortuosity. I buddy wired the obtuse marginal with a grand slam wire with a moderate amount of difficulty. The stent was placed on the grand slam wire and it still would not cross. I then took  a 3 mm balloon to help anchor delivery of a guideliner catheter. This allowed successful stent delivery through the guide line or catheter. The lesion was then stented with a 3.5x12 mm Xience stent. The stent  was postdilated with a 3.75 x 8 mm noncompliant balloon. Following PCI, there was 0% residual stenosis and TIMI-3 flow. The AV groove circumflex was narrowed after stenting but there remained TIMI-3 flow. Considering the patient's significant chronic kidney disease and overall small area of myocardium, I elected to treat this vessel medically. Final angiography confirmed an excellent result. The patient tolerated the procedure well. There were no immediate procedural complications. A TR band was used for radial hemostasis. The patient was transferred to the post catheterization recovery area for further monitoring.  PCI Data:  Vessel -first obtuse marginal/Segment - proximal  Percent Stenosis (pre) 95  TIMI-flow 3  Stent 3.5 x 12 mm drug-eluting  Percent Stenosis (post) 0  TIMI-flow (post) 3  Total contrast equals 125 cc  Final Conclusions:  1. Severe left circumflex stenosis with successful PCI as detailed above  2. Nonobstructive LAD and right coronary artery stenoses  3. Residual severe stenosis in a small subbranch of the left circumflex  Recommendations:  Recommend dual antiplatelet therapy with aspirin and Plavix for 12 months. Medical therapy for her residual CAD unless refractory angina occurs.  Tonny BollmanMichael Cooper  06/07/2012, 5:30 PM      Assessment / Plan: 1. CAD - with recurrent angina - would favor medical management - try to add Imdur 30 mg a day.   2. Valvular heart disease - needs her echo updated. Hope that we can continue to just monitor.   3. CKD - followed by Dr. Hyman HopesWebb  4. HTN - BP is up - 170/60 by me - she is not able to take Norvasc due to swelling. Will try low dose Hydralazine 25 mg BID.   See back for discussion. Reminded to use NTG prn.   Patient is agreeable to this plan and will call if any problems develop in the interim.   Rosalio MacadamiaLori C. Susie Pousson, RN, ANP-C [facility] [address] [place], [place]  [id number] [phone]  [phone]

## 2014-04-17 NOTE — Patient Instructions (Signed)
We will check labs today  We will get an ultrasound of your heart  Start Imdur 30 mg a day - this is a long acting form of nitroglycerin  Use your NTG under your tongue for recurrent chest pain. May take one tablet every 5 minutes. If you are still having discomfort after 3 tablets in 15 minutes, call 911.  Start Hydralazine 25 mg two times a day - this is for your blood pressure  Keep a check on your blood pressure  See Dr. Excell Seltzerooper back for discussion  Call the [facility] office at [phone] if you have any questions, problems or concerns.

## 2014-04-24 ENCOUNTER — Ambulatory Visit (HOSPITAL_COMMUNITY)
Admission: RE | Admit: 2014-04-24 | Discharge: 2014-04-24 | Disposition: A | Discharge status: Home / Self Care | Payer: Medicare Other | Source: Ambulatory Visit | Attending: Cardiovascular Disease | Admitting: Cardiovascular Disease

## 2014-04-24 DIAGNOSIS — I359 Nonrheumatic aortic valve disorder, unspecified: Secondary | ICD-10-CM

## 2014-04-24 DIAGNOSIS — R002 Palpitations: Secondary | ICD-10-CM | POA: Insufficient documentation

## 2014-04-24 DIAGNOSIS — J9601 Acute respiratory failure with hypoxia: Secondary | ICD-10-CM

## 2014-04-24 NOTE — Progress Notes (Signed)
2D Echocardiogram Complete.  04/24/2014   Farrel Conners, RDCS   Preliminary Technician Findings: Moderately Calcified Aortic Valve (as seen prior), now with mild- moderately decreased EF (35-40%) causing a possible "lower flow" state and underestimation of Aortic Stenosis.  The patient is having no Chest Pain and appears stable during this visit.

## 2014-04-25 ENCOUNTER — Encounter: Payer: Self-pay | Admitting: Cardiovascular Disease

## 2014-04-25 NOTE — Telephone Encounter (Signed)
New message ° ° ° ° ° ° ° ° ° °Pt returning nurses call °

## 2014-04-25 NOTE — Telephone Encounter (Signed)
This encounter was created in error - please disregard.

## 2014-04-29 ENCOUNTER — Encounter: Payer: Self-pay | Admitting: Cardiovascular Disease

## 2014-04-29 ENCOUNTER — Ambulatory Visit (INDEPENDENT_AMBULATORY_CARE_PROVIDER_SITE_OTHER): Payer: Medicare Other | Admitting: Cardiovascular Disease

## 2014-04-29 VITALS — BP 162/70 | HR 64 | Ht 61.0 in | Wt 127.1 lb

## 2014-04-29 DIAGNOSIS — I2510 Atherosclerotic heart disease of native coronary artery without angina pectoris: Secondary | ICD-10-CM

## 2014-04-29 DIAGNOSIS — R079 Chest pain, unspecified: Secondary | ICD-10-CM

## 2014-04-29 MED ORDER — ISOSORBIDE MONONITRATE ER 60 MG PO TB24: 60.0000 mg | ORAL_TABLET | Freq: Every day | ORAL | Status: DC

## 2014-04-29 NOTE — Patient Instructions (Signed)
Your physician has recommended you make the following change in your medication: INCREASE Isosorbide MN to 60mg  take one by mouth daily  Your physician recommends that you schedule a follow-up appointment with Norma Fredrickson NP

## 2014-04-29 NOTE — Progress Notes (Signed)
HPI:  80 year old woman presenting for followup evaluation. The patient's cardiac history is pertinent for diastolic heart failure, coronary artery disease, and atrial fibrillation. She presented in 2013 with a non-ST elevation infarction and was treated with a drug-eluting stent in the obtuse marginal branch of the left circumflex. She's also had symptomatic atrial fibrillation with associated heart failure. She has had ventilatory dependent respiratory failure associated with this. She is unable to take anticoagulation. She had a severe allergic skin reaction to warfarin. She's been maintained on dual antiplatelet therapy with aspirin and Plavix. She was recently seen by Norma Fredrickson and complained of worsening anginal symptoms at that time. Imdur was added to her medical regimen she presents today for followup evaluation.  The patient continues to have significant angina. She describes a burning and pressure-like sensation in her chest, arms, and neck. This is associated with generalized weakness and shortness of breath. Symptoms are slightly improved after starting isosorbide. Her symptoms remain nitroglycerin responsive. She had a profound episode last night in the bathtub and had to get out to get her nitroglycerin. She is not able to do any physical activity because of limitation related to angina.  Outpatient Encounter Prescriptions as of 04/29/2014  Medication Sig  . aspirin EC 81 MG tablet Take 1 tablet (81 mg total) by mouth daily.  Marland Kitchen b complex vitamins tablet Take 1 tablet by mouth daily.    . benzonatate (TESSALON) 100 MG capsule Take 1 capsule (100 mg total) by mouth 3 (three) times daily as needed for cough.  . bisoprolol (ZEBETA) 5 MG tablet TAKE 1 TABLET BY MOUTH DAILY  . Cholecalciferol (VITAMIN D-3) 5000 UNITS TABS Take 1 tablet by mouth daily.  . clopidogrel (PLAVIX) 75 MG tablet Take 1 tablet (75 mg total) by mouth daily with breakfast.  . fish oil-omega-3 fatty acids 1000 MG  capsule Take 1 g by mouth 2 (two) times daily.   . furosemide (LASIX) 40 MG tablet TAKE 1 TABLET BY MOUTH DAILY, take extra for weight gain of 3 lbs in 24 hours  . hydrALAZINE (APRESOLINE) 25 MG tablet Take 1 tablet (25 mg total) by mouth 2 (two) times daily.  . isosorbide mononitrate (IMDUR) 30 MG 24 hr tablet Take 1 tablet (30 mg total) by mouth daily.  Marland Kitchen loratadine (CLARITIN) 10 MG tablet Take 1 tablet (10 mg total) by mouth every other day as needed for allergies (Take in AM (on days when taking)).  . Magnesium 250 MG TABS Take 1 tablet by mouth once.  . nitroGLYCERIN (NITROSTAT) 0.4 MG SL tablet Place 1 tablet (0.4 mg total) under the tongue every 5 (five) minutes x 3 doses as needed for chest pain.    Allergies  Allergen Reactions  . Metoprolol Swelling    Takes bisoprolol without issue  . Norvasc [Amlodipine Besylate] Swelling    Takes nisoldipine without issue  . Sulfonamide Derivatives Hives, Itching and Swelling  . Amiodarone Nausea And Vomiting  . Medrol [Methylprednisolone]     Fluid retention/CHF exacerbation - caution with steroids in future if ever needed  . Statins     Myalgias - "I won't take them"  . Coumadin [Warfarin Sodium] Rash    Past Medical History  Diagnosis Date  . Lower extremity edema     bilateral  . CKD (chronic kidney disease), stage IV     a. baseline creat previously 1.6->2 - elevated during 03/2013 adm.  . Labile hypertension   . Anxiety   . Failure  to thrive in childhood   . History of shingles July 2004    on the left side of her thorax and postherpetic neuralgia  . History of psoriasis   . History of recurrent UTIs   . Major depressive disorder, single episode, severe 2004    "when my husband died"  . Valvular heart disease     a.  11/2012 Echo: Mild AS/AI/MR;  b. 02/2013 Echo: EF 55-60%, mildly dil LA, calcified AV/MV.  Marland Kitchen. CAD (coronary artery disease)     a. NSTEMI 05/2012 s/p DES to OM (residual severe LCx subbranch dz for med rx unless  refractory angina recurs) b. NSTEMI 03/2013 felt 2/2 demand in setting of resp failure, for med rx.  Marland Kitchen. LBBB (left bundle branch block)     intermittent  . Chronic combined systolic and diastolic CHF (congestive heart failure)     a. 05/2012 Echo: EF 60-65%;  b. 07/2012 Echo in setting of afib: EF 35%;  c. 02/2013 Echo: EF 55-60%, mildly dil LA.  Marland Kitchen. PAF (paroxysmal atrial fibrillation)     a. Dx 07/2012 -> Amio started but later dc'd 2/2 nausea/NSR. b. 02/2013 Coumadin initiated, dc'd 03/2013 2/2 allergic reaction. c. Ultimately not on NOAC due to age, Coumadin allergy and low CrCl.  Marland Kitchen. Respiratory failure     a. Requiring intubation 03/2013, secondary to cardiogenic pulmonary edema.    ROS: Negative except as per HPI  BP 162/70  Pulse 64  Ht 5\' 1"  (1.549 m)  Wt 57.661 kg (127 lb 1.9 oz)  BMI 24.03 kg/m2  PHYSICAL EXAM: Pt is alert and oriented, pleasant elderly woman in NAD HEENT: normal Neck: JVP - normal, carotids 2+= with soft bilateral bruits Lungs: CTA bilaterally CV: RRR with grade 2/6 systolic murmur at the right upper sternal border and soft diastolic decrescendo murmur at the right upper sternal Abd: soft, NT, Positive BS, no hepatomegaly Ext: no C/C/E, distal pulses intact and equal Skin: warm/dry no rash  2-D echocardiogram 04/24/2014: Study Conclusions  - Left ventricle: The cavity size was normal. Wall thickness was increased in a pattern of mild LVH. Systolic function was mildly to moderately reduced. The estimated ejection fraction was in the range of 40% to 45%. Global hypokinesis with regional variation - distal septal incoordinate motion. Doppler parameters are consistent with abnormal left ventricular relaxation (grade 1 diastolic dysfunction). The E/e&' ratio is >20, suggesting markedly elevated LV filling pressure. - Aortic valve: Mild aortic stenosis - peak and mean gradients of 16 mmHg and 8 mmHg, respectively. There was moderate regurgitation. Valve area (VTI):  1.95 cm^2. Valve area (Vmax): 1.91 cm^2. - Mitral valve: Thickened, echogenic leaflets with reduced excursion. Mild mitral stenosis - mean gradient 8 mmHG at HR 61. There was mild regurgitation. - Left atrium: Moderately dilated. LA Volume/BSA= 41.7 ml/m2. - Tricuspid valve: There was moderate regurgitation. - Pulmonary arteries: PA peak pressure: 47 mm Hg (S).  ASSESSMENT AND PLAN: 1. Coronary artery disease, native vessel. The patient has CCS class III angina. She is markedly limited. I think cardiac catheterization is clearly indicated, but unfortunately she has advanced kidney disease (see CKD stage IV). Her cardiac risk of major events is significant with progressive symptoms of angina and I explained this to the patient and her daughter today. She is extremely reluctant to undergo any procedure that could cause her to progress to end-stage renal disease. I discussed her case with her nephrologist, Dr. Hyman HopesWebb. This is clearly a situation where risk/benefit has to be weighed.  Her GFR is less than 20. She would like to delay the cardiac catheterization and continue to work on medical therapy after a full discussion of risks and benefits. I will increase her isosorbide to 60 mg. She otherwise will remain on her current medical program. Will arrange for close followup with Norma Fredrickson. The patient was advised to call [phone] if she experiences resting chest pain or symptoms unresponsive to nitroglycerin. Otherwise she will contact us if symptoms progress.  2. Chronic kidney disease, stage IV. As above. The patient is on no nephrotoxic agents. If she requires cardiac catheterization, will likely hold her furosemide and bring her in the night before the procedure for optimization of fluids.  3. Hypertension. Imdur will be increased. She will remain on hydralazine and bisoprolol.  4. Valvular heart disease. Mild LV dysfunction noted with mild aortic stenosis and moderate aortic regurgitation. Continue  medical management at this time.  Tonny Bollman 04/29/2014 9:20 AM

## 2014-05-27 ENCOUNTER — Ambulatory Visit (INDEPENDENT_AMBULATORY_CARE_PROVIDER_SITE_OTHER): Payer: Medicare Other | Admitting: Nurse Practitioner

## 2014-05-27 ENCOUNTER — Encounter: Payer: Self-pay | Admitting: Nurse Practitioner

## 2014-05-27 VITALS — BP 180/70 | HR 60 | Ht 61.0 in | Wt 127.1 lb

## 2014-05-27 DIAGNOSIS — I10 Essential (primary) hypertension: Secondary | ICD-10-CM

## 2014-05-27 DIAGNOSIS — I200 Unstable angina: Secondary | ICD-10-CM

## 2014-05-27 DIAGNOSIS — I25110 Atherosclerotic heart disease of native coronary artery with unstable angina pectoris: Secondary | ICD-10-CM

## 2014-05-27 DIAGNOSIS — N184 Chronic kidney disease, stage 4 (severe): Secondary | ICD-10-CM

## 2014-05-27 DIAGNOSIS — I2510 Atherosclerotic heart disease of native coronary artery without angina pectoris: Secondary | ICD-10-CM

## 2014-05-27 DIAGNOSIS — R079 Chest pain, unspecified: Secondary | ICD-10-CM

## 2014-05-27 NOTE — Patient Instructions (Signed)
Stay on your current medicines  I will see you in 6 weeks  Use your NTG under your tongue for recurrent chest pain. May take one tablet every 5 minutes. If you are still having discomfort after 3 tablets in 15 minutes, call 911.  Call the [facility] office at [phone] if you have any questions, problems or concerns.

## 2014-05-27 NOTE — Progress Notes (Signed)
[name] Date of Birth: 1930-03-27 Medical Record #[id number]  History of Present Illness: [name] is seen back today for a one month check. Seen for Dr. Excell Seltzer. She is an 80 year old female with diastolic HF, CAD and AF. She had a NSTEMI in 2013 and was treated with a DES to Deanna OM of Deanna LCX. She has had symptomatic AF with associated heart failure as well as ventilatory dependent respiratory failure. She is unable to take anticoagulation - has had severe allergic skin reaction to warfarin. Maintained on DAPT with aspirin and Plavix. She has a chronic LBBB on EKG. Known valvular heart disease as well and CKD - followed by Dr. Hyman Hopes.   Seen by me back in May by me. Having angina. BP was up. I added Imdur and Hydralazine.   Saw Dr. Excell Seltzer a month ago - still with angina but a little improved with Deanna nitrate that I had started. Very reluctant to proceed with cardiac cath given her CKD stage 4 with GFR less than 20. Opting for medical management. Imdur was increased to 60 mg.   Comes back today. Here with her daughter. She has done better over this past month. She thinks she has just used NTG sl x 1. This was 3 nights ago, her arms were aching and her neck was aching. She knew it was her angina. She can't really do much in Deanna way of activity. She is afraid. Feels her neck is sore/glands sore - this may be worse with activity. Still not ready for cardiac cath but considering. Would need to go in Deanna night before for hydration and hold Lasix accordingly.   Current Outpatient Prescriptions  Medication Sig Dispense Refill  . aspirin EC 81 MG tablet Take 1 tablet (81 mg total) by mouth daily.      Marland Kitchen b complex vitamins tablet Take 1 tablet by mouth daily.        . bisoprolol (ZEBETA) 5 MG tablet TAKE 1 TABLET BY MOUTH DAILY  30 tablet  2  . Cholecalciferol (VITAMIN D-3) 5000 UNITS TABS Take 1 tablet by mouth daily.      . clopidogrel (PLAVIX) 75 MG tablet Take 1 tablet (75 mg total) by  mouth daily with breakfast.  30 tablet  6  . fish oil-omega-3 fatty acids 1000 MG capsule Take 1 g by mouth 2 (two) times daily.       . furosemide (LASIX) 40 MG tablet TAKE 1 TABLET BY MOUTH DAILY, take extra for weight gain of 3 lbs in 24 hours      . hydrALAZINE (APRESOLINE) 25 MG tablet Take 1 tablet (25 mg total) by mouth 2 (two) times daily.  60 tablet  3  . isosorbide mononitrate (IMDUR) 60 MG 24 hr tablet Take 1 tablet (60 mg total) by mouth daily.  30 tablet  3  . loratadine (CLARITIN) 10 MG tablet Take 1 tablet (10 mg total) by mouth every other day as needed for allergies (Take in AM (on days when taking)).      . Magnesium 250 MG TABS Take 1 tablet by mouth once.      . nitroGLYCERIN (NITROSTAT) 0.4 MG SL tablet Place 1 tablet (0.4 mg total) under Deanna tongue every 5 (five) minutes x 3 doses as needed for chest pain.  25 tablet  4   No current facility-administered medications for this visit.    Allergies  Allergen Reactions  . Metoprolol Swelling    Takes bisoprolol  without issue  . Norvasc [Amlodipine Besylate] Swelling    Takes nisoldipine without issue  . Sulfonamide Derivatives Hives, Itching and Swelling  . Amiodarone Nausea And Vomiting  . Medrol [Methylprednisolone]     Fluid retention/CHF exacerbation - caution with steroids in future if ever needed  . Statins     Myalgias - "I won't take them"  . Coumadin [Warfarin Sodium] Rash    Past Medical History  Diagnosis Date  . Lower extremity edema     bilateral  . CKD (chronic kidney disease), stage IV     a. baseline creat previously 1.6->2 - elevated during 03/2013 adm.  . Labile hypertension   . Anxiety   . Failure to thrive in childhood   . History of shingles July 2004    on Deanna left side of her thorax and postherpetic neuralgia  . History of psoriasis   . History of recurrent UTIs   . Major depressive disorder, single episode, severe 2004    "when my husband died"  . Valvular heart disease     a.  11/2012  Echo: Mild AS/AI/MR;  b. 02/2013 Echo: EF 55-60%, mildly dil LA, calcified AV/MV.  Marland Kitchen. CAD (coronary artery disease)     a. NSTEMI 05/2012 s/p DES to OM (residual severe LCx subbranch dz for med rx unless refractory angina recurs) b. NSTEMI 03/2013 felt 2/2 demand in setting of resp failure, for med rx.  Marland Kitchen. LBBB (left bundle branch block)     intermittent  . Chronic combined systolic and diastolic CHF (congestive heart failure)     a. 05/2012 Echo: EF 60-65%;  b. 07/2012 Echo in setting of afib: EF 35%;  c. 02/2013 Echo: EF 55-60%, mildly dil LA.  Marland Kitchen. PAF (paroxysmal atrial fibrillation)     a. Dx 07/2012 -> Amio started but later dc'd 2/2 nausea/NSR. b. 02/2013 Coumadin initiated, dc'd 03/2013 2/2 allergic reaction. c. Ultimately not on NOAC due to age, Coumadin allergy and low CrCl.  Marland Kitchen. Respiratory failure     a. Requiring intubation 03/2013, secondary to cardiogenic pulmonary edema.    Past Surgical History  Procedure Laterality Date  . Salpingoophorectomy      "cyst removed"  . Appendectomy    . Breast cyst excision    . Abdominal hysterectomy    . Cataract extraction, bilateral  ~ 2010  . Cardiac surgery      History  Smoking status  . Former Smoker -- 1.50 packs/day for 30 years  . Types: Cigarettes  . Quit date: 09/01/1979  Smokeless tobacco  . Never Used    History  Alcohol Use  . Yes    Comment: 06/06/12 "mixed drink once or twice a year.to celebrate"    Family History  Problem Relation Age of Onset  . Diabetes    . Cancer    . Lung cancer      Review of Systems: Deanna review of systems is per Deanna HPI.  All other systems were reviewed and are negative.  Physical Exam: BP 180/70  Pulse 60  Ht 5\' 1"  (1.549 m)  Wt 127 lb 1.9 oz (57.661 kg)  BMI 24.03 kg/m2 BP is 140/70 by me after sitting for several minutes.  Herrera is very pleasant and in no acute distress. Skin is warm and dry. Color is normal.  HEENT is unremarkable. Normocephalic/atraumatic. PERRL. Sclera are  nonicteric. Neck is supple. No masses. No JVD. Lungs are clear. Cardiac exam shows a regular rate and rhythm. Harsh murmur noted.  Abdomen is soft. Extremities are without edema. Gait and ROM are intact. No gross neurologic deficits noted.  Wt Readings from Last 3 Encounters:  05/27/14 127 lb 1.9 oz (57.661 kg)  04/29/14 127 lb 1.9 oz (57.661 kg)  04/17/14 127 lb 6.4 oz (57.788 kg)    LABORATORY DATA/PROCEDURES: EKG today with sinus with LBBB   Lab Results  Component Value Date   WBC 7.6 04/17/2014   HGB 10.2* 04/17/2014   HCT 30.5* 04/17/2014   PLT 324.0 04/17/2014   GLUCOSE 94 04/17/2014   CHOL 223* 03/16/2013   TRIG 119 03/16/2013   HDL 59 03/16/2013   LDLCALC 140* 03/16/2013   ALT 21 04/08/2013   AST 38* 04/08/2013   NA 135 04/17/2014   K 3.7 04/17/2014   CL 97 04/17/2014   CREATININE 2.5* 04/17/2014   BUN 45* 04/17/2014   CO2 27 04/17/2014   TSH 1.429 03/15/2013   INR 2.43* 04/08/2013    BNP (last 3 results) No results found for this basename: PROBNP,  in Deanna last 8760 hours  Coronary angiography:  Coronary dominance: right  Left mainstem: Deanna left mainstem is patent. There is mild irregularity without significant stenosis. Deanna left main is short.  Left anterior descending (LAD): Deanna LAD courses to Deanna left ventricular apex. There is diffuse proximal calcification. Deanna ostium of Deanna LAD has 20-30% stenosis. Deanna proximal vessel has scattered irregularities throughout. Deanna mid vessel has 50% stenosis. Deanna distal vessel as it wraps around Deanna apex is widely patent there were 2 diagonal branches arising from Deanna LAD. Deanna first is an 80% ostial stenosis and this is a very small diagonal. Deanna second diagonal is of moderate caliber and is patent throughout.  Left circumflex (LCx): Deanna left circumflex is tortuous. Deanna vessel is moderately calcified. It supplies a large first obtuse marginal branch that then divides into twin branches. Deanna proximal aspect of Deanna marginal branch has a 95% stenosis  present. This lesion is very focal in nature. Deanna AV groove circumflex beyond Deanna marginal branch is small. As Deanna mid AV groove circumflex arises from Deanna marginal there is a 70% ostial stenosis. It then gives off 2 small branch vessels, Deanna first of which has a 95% stenosis in Deanna second of which is widely patent. Both of these vessels are less than 2 mm in diameter.  Right coronary artery (RCA): Deanna right coronary cusp is calcified. Deanna RCA is patent throughout. There are diffuse irregularities and mild calcification. There are no high-grade stenoses present. Deanna vessel gives off a single PDA and a single posterolateral branch.  Left ventriculography: Deferred because of chronic kidney disease  PCI Note: Following Deanna diagnostic procedure, Deanna decision was made to proceed with PCI. Deanna radial sheath was upsized to a 6 [place]. Weight-based bivalirudin was given for anticoagulation. [name] was loaded with 600 mg of Plavix on Deanna table. Once a therapeutic ACT was achieved, a 6 [place] XB LAD 3.5 cm guide catheter was inserted. A cougar coronary guidewire was used to cross Deanna lesion. Deanna obtuse marginal branch was tortuous and Deanna wire was moderately difficult to advance Deanna lesion was predilated with a 2.5 x 15 mm balloon. I then attempted to advance a 3.5 x 12 mm Xience Xpedition DES. However, Deanna stent would not cross Deanna lesion. This appeared to be due to vessel tortuosity. I buddy wired Deanna obtuse marginal with a grand slam wire with a moderate amount of difficulty. Deanna stent was placed on Deanna grand  slam wire and it still would not cross. I then took a 3 mm balloon to help anchor delivery of a guideliner catheter. This allowed successful stent delivery through Deanna guide line or catheter. Deanna lesion was then stented with a 3.5x12 mm Xience stent. Deanna stent was postdilated with a 3.75 x 8 mm noncompliant balloon. Following PCI, there was 0% residual stenosis and TIMI-3 flow. Deanna AV groove circumflex was  narrowed after stenting but there remained TIMI-3 flow. Considering [name]'s significant chronic kidney disease and overall small area of myocardium, I elected to treat this vessel medically. Final angiography confirmed an excellent result. [name] tolerated Deanna procedure well. There were no immediate procedural complications. A TR band was used for radial hemostasis. [name] was transferred to Deanna post catheterization recovery area for further monitoring.  PCI Data:  Vessel -first obtuse marginal/Segment - proximal  Percent Stenosis (pre) 95  TIMI-flow 3  Stent 3.5 x 12 mm drug-eluting  Percent Stenosis (post) 0  TIMI-flow (post) 3  Total contrast equals 125 cc  Final Conclusions:  1. Severe left circumflex stenosis with successful PCI as detailed above  2. Nonobstructive LAD and right coronary artery stenoses  3. Residual severe stenosis in a small subbranch of Deanna left circumflex  Recommendations:  Recommend dual antiplatelet therapy with aspirin and Plavix for 12 months. Medical therapy for her residual CAD unless refractory angina occurs.  Tonny Bollman  06/07/2012, 5:30 PM    Echo Study Conclusions from May 2015  - Left ventricle: Deanna cavity size was normal. Wall thickness was increased in a pattern of mild LVH. Systolic function was mildly to moderately reduced. Deanna estimated ejection fraction was in Deanna range of 40% to 45%. Global hypokinesis with regional variation - distal septal incoordinate motion. Doppler parameters are consistent with abnormal left ventricular relaxation (grade 1 diastolic dysfunction). Deanna E/e&' ratio is >20, suggesting markedly elevated LV filling pressure. - Aortic valve: Mild aortic stenosis - peak and mean gradients of 16 mmHg and 8 mmHg, respectively. There was moderate regurgitation. Valve area (VTI): 1.95 cm^2. Valve area (Vmax): 1.91 cm^2. - Mitral valve: Thickened, echogenic leaflets with reduced excursion. Mild mitral stenosis -  mean gradient 8 mmHG at HR 61. There was mild regurgitation. - Left atrium: Moderately dilated. LA Volume/BSA= 41.7 ml/m2. - Tricuspid valve: There was moderate regurgitation. - Pulmonary arteries: PA peak pressure: 47 mm Hg (S).    Assessment / Plan:  1. CAD - with recurrent angina - does seem to be improved with current medical management. BP improved. Less NTG use. Will keep on her current regimen. See back in 6 weeks. Reminded to let her sl NTG "be her friend". Call if needed and if wanting to proceed with cardiac cath.   2. Valvular heart disease - recent echo with some decrease in LV function and mild AS and moderate TR and felt to have markedly elevated LV filling pressures. Managed medically.   3. CKD - followed by Dr. Hyman Hopes   4. HTN - BP improved by recheck by me. Continue with current regimen.  Herrera is agreeable to this plan and will call if any problems develop in Deanna interim.   Rosalio Macadamia, RN, ANP-C  [facility]  [address]  [place], [place] [address]  [phone]

## 2014-06-04 ENCOUNTER — Other Ambulatory Visit (HOSPITAL_COMMUNITY): Payer: Self-pay | Admitting: Cardiovascular Disease

## 2014-06-16 ENCOUNTER — Encounter (HOSPITAL_COMMUNITY): Payer: Self-pay | Admitting: Emergency Medicine

## 2014-06-16 ENCOUNTER — Emergency Department (HOSPITAL_COMMUNITY): Payer: Medicare Other

## 2014-06-16 ENCOUNTER — Observation Stay (HOSPITAL_COMMUNITY): Payer: Medicare Other

## 2014-06-16 ENCOUNTER — Inpatient Hospital Stay (HOSPITAL_COMMUNITY)
Admission: EM | Admit: 2014-06-16 | Discharge: 2014-06-20 | DRG: 871 | Disposition: A | Discharge status: Home / Self Care | Payer: Medicare Other | Attending: Internal Medicine | Admitting: Internal Medicine

## 2014-06-16 ENCOUNTER — Inpatient Hospital Stay (HOSPITAL_COMMUNITY): Payer: Medicare Other

## 2014-06-16 DIAGNOSIS — I2584 Coronary atherosclerosis due to calcified coronary lesion: Secondary | ICD-10-CM

## 2014-06-16 DIAGNOSIS — E872 Acidosis, unspecified: Secondary | ICD-10-CM | POA: Diagnosis present

## 2014-06-16 DIAGNOSIS — A419 Sepsis, unspecified organism: Secondary | ICD-10-CM | POA: Diagnosis present

## 2014-06-16 DIAGNOSIS — Z91040 Latex allergy status: Secondary | ICD-10-CM

## 2014-06-16 DIAGNOSIS — J9602 Acute respiratory failure with hypercapnia: Secondary | ICD-10-CM

## 2014-06-16 DIAGNOSIS — E876 Hypokalemia: Secondary | ICD-10-CM | POA: Diagnosis not present

## 2014-06-16 DIAGNOSIS — I5033 Acute on chronic diastolic (congestive) heart failure: Secondary | ICD-10-CM

## 2014-06-16 DIAGNOSIS — R55 Syncope and collapse: Secondary | ICD-10-CM

## 2014-06-16 DIAGNOSIS — R6521 Severe sepsis with septic shock: Secondary | ICD-10-CM

## 2014-06-16 DIAGNOSIS — J9600 Acute respiratory failure, unspecified whether with hypoxia or hypercapnia: Secondary | ICD-10-CM | POA: Diagnosis present

## 2014-06-16 DIAGNOSIS — J069 Acute upper respiratory infection, unspecified: Secondary | ICD-10-CM | POA: Diagnosis present

## 2014-06-16 DIAGNOSIS — N390 Urinary tract infection, site not specified: Secondary | ICD-10-CM | POA: Diagnosis present

## 2014-06-16 DIAGNOSIS — I248 Other forms of acute ischemic heart disease: Secondary | ICD-10-CM | POA: Diagnosis present

## 2014-06-16 DIAGNOSIS — Z801 Family history of malignant neoplasm of trachea, bronchus and lung: Secondary | ICD-10-CM

## 2014-06-16 DIAGNOSIS — R002 Palpitations: Secondary | ICD-10-CM

## 2014-06-16 DIAGNOSIS — Z882 Allergy status to sulfonamides status: Secondary | ICD-10-CM | POA: Diagnosis not present

## 2014-06-16 DIAGNOSIS — N184 Chronic kidney disease, stage 4 (severe): Secondary | ICD-10-CM

## 2014-06-16 DIAGNOSIS — E785 Hyperlipidemia, unspecified: Secondary | ICD-10-CM

## 2014-06-16 DIAGNOSIS — Z9861 Coronary angioplasty status: Secondary | ICD-10-CM

## 2014-06-16 DIAGNOSIS — D631 Anemia in chronic kidney disease: Secondary | ICD-10-CM | POA: Diagnosis present

## 2014-06-16 DIAGNOSIS — I2510 Atherosclerotic heart disease of native coronary artery without angina pectoris: Secondary | ICD-10-CM

## 2014-06-16 DIAGNOSIS — K219 Gastro-esophageal reflux disease without esophagitis: Secondary | ICD-10-CM | POA: Diagnosis present

## 2014-06-16 DIAGNOSIS — I5042 Chronic combined systolic (congestive) and diastolic (congestive) heart failure: Secondary | ICD-10-CM

## 2014-06-16 DIAGNOSIS — R0989 Other specified symptoms and signs involving the circulatory and respiratory systems: Secondary | ICD-10-CM

## 2014-06-16 DIAGNOSIS — I359 Nonrheumatic aortic valve disorder, unspecified: Secondary | ICD-10-CM | POA: Diagnosis present

## 2014-06-16 DIAGNOSIS — I25119 Atherosclerotic heart disease of native coronary artery with unspecified angina pectoris: Secondary | ICD-10-CM

## 2014-06-16 DIAGNOSIS — I214 Non-ST elevation (NSTEMI) myocardial infarction: Secondary | ICD-10-CM

## 2014-06-16 DIAGNOSIS — I252 Old myocardial infarction: Secondary | ICD-10-CM

## 2014-06-16 DIAGNOSIS — R6520 Severe sepsis without septic shock: Secondary | ICD-10-CM

## 2014-06-16 DIAGNOSIS — L408 Other psoriasis: Secondary | ICD-10-CM | POA: Diagnosis present

## 2014-06-16 DIAGNOSIS — J810 Acute pulmonary edema: Secondary | ICD-10-CM

## 2014-06-16 DIAGNOSIS — M549 Dorsalgia, unspecified: Secondary | ICD-10-CM

## 2014-06-16 DIAGNOSIS — Z7982 Long term (current) use of aspirin: Secondary | ICD-10-CM | POA: Diagnosis not present

## 2014-06-16 DIAGNOSIS — I25111 Atherosclerotic heart disease of native coronary artery with angina pectoris with documented spasm: Secondary | ICD-10-CM

## 2014-06-16 DIAGNOSIS — Z66 Do not resuscitate: Secondary | ICD-10-CM | POA: Diagnosis not present

## 2014-06-16 DIAGNOSIS — I129 Hypertensive chronic kidney disease with stage 1 through stage 4 chronic kidney disease, or unspecified chronic kidney disease: Secondary | ICD-10-CM | POA: Diagnosis present

## 2014-06-16 DIAGNOSIS — Z7902 Long term (current) use of antithrombotics/antiplatelets: Secondary | ICD-10-CM

## 2014-06-16 DIAGNOSIS — N039 Chronic nephritic syndrome with unspecified morphologic changes: Secondary | ICD-10-CM

## 2014-06-16 DIAGNOSIS — I249 Acute ischemic heart disease, unspecified: Secondary | ICD-10-CM

## 2014-06-16 DIAGNOSIS — Z7901 Long term (current) use of anticoagulants: Secondary | ICD-10-CM

## 2014-06-16 DIAGNOSIS — Z87891 Personal history of nicotine dependence: Secondary | ICD-10-CM

## 2014-06-16 DIAGNOSIS — Z823 Family history of stroke: Secondary | ICD-10-CM | POA: Diagnosis not present

## 2014-06-16 DIAGNOSIS — Z833 Family history of diabetes mellitus: Secondary | ICD-10-CM

## 2014-06-16 DIAGNOSIS — I447 Left bundle-branch block, unspecified: Secondary | ICD-10-CM | POA: Diagnosis present

## 2014-06-16 DIAGNOSIS — I4891 Unspecified atrial fibrillation: Secondary | ICD-10-CM

## 2014-06-16 DIAGNOSIS — R011 Cardiac murmur, unspecified: Secondary | ICD-10-CM

## 2014-06-16 DIAGNOSIS — I509 Heart failure, unspecified: Secondary | ICD-10-CM | POA: Diagnosis present

## 2014-06-16 DIAGNOSIS — F411 Generalized anxiety disorder: Secondary | ICD-10-CM | POA: Diagnosis present

## 2014-06-16 DIAGNOSIS — R05 Cough: Secondary | ICD-10-CM | POA: Diagnosis not present

## 2014-06-16 DIAGNOSIS — I5043 Acute on chronic combined systolic (congestive) and diastolic (congestive) heart failure: Secondary | ICD-10-CM | POA: Diagnosis present

## 2014-06-16 DIAGNOSIS — I5021 Acute systolic (congestive) heart failure: Secondary | ICD-10-CM

## 2014-06-16 DIAGNOSIS — R5383 Other fatigue: Secondary | ICD-10-CM

## 2014-06-16 DIAGNOSIS — Z9089 Acquired absence of other organs: Secondary | ICD-10-CM | POA: Diagnosis not present

## 2014-06-16 DIAGNOSIS — J9601 Acute respiratory failure with hypoxia: Secondary | ICD-10-CM

## 2014-06-16 DIAGNOSIS — Z9849 Cataract extraction status, unspecified eye: Secondary | ICD-10-CM | POA: Diagnosis not present

## 2014-06-16 DIAGNOSIS — I209 Angina pectoris, unspecified: Secondary | ICD-10-CM

## 2014-06-16 DIAGNOSIS — Z888 Allergy status to other drugs, medicaments and biological substances status: Secondary | ICD-10-CM

## 2014-06-16 DIAGNOSIS — G9349 Other encephalopathy: Secondary | ICD-10-CM | POA: Diagnosis present

## 2014-06-16 DIAGNOSIS — T79A3XA Traumatic compartment syndrome of abdomen, initial encounter: Secondary | ICD-10-CM

## 2014-06-16 DIAGNOSIS — R059 Cough, unspecified: Secondary | ICD-10-CM | POA: Diagnosis present

## 2014-06-16 DIAGNOSIS — R079 Chest pain, unspecified: Secondary | ICD-10-CM

## 2014-06-16 DIAGNOSIS — J189 Pneumonia, unspecified organism: Secondary | ICD-10-CM

## 2014-06-16 DIAGNOSIS — I2489 Other forms of acute ischemic heart disease: Secondary | ICD-10-CM | POA: Diagnosis present

## 2014-06-16 DIAGNOSIS — I200 Unstable angina: Secondary | ICD-10-CM

## 2014-06-16 DIAGNOSIS — Z79899 Other long term (current) drug therapy: Secondary | ICD-10-CM | POA: Diagnosis not present

## 2014-06-16 DIAGNOSIS — M7989 Other specified soft tissue disorders: Secondary | ICD-10-CM

## 2014-06-16 DIAGNOSIS — I10 Essential (primary) hypertension: Secondary | ICD-10-CM

## 2014-06-16 DIAGNOSIS — G8929 Other chronic pain: Secondary | ICD-10-CM

## 2014-06-16 DIAGNOSIS — R5381 Other malaise: Secondary | ICD-10-CM

## 2014-06-16 LAB — HEPARIN LEVEL (UNFRACTIONATED): Heparin Unfractionated: 0.39 IU/mL (ref 0.30–0.70)

## 2014-06-16 LAB — URINALYSIS, ROUTINE W REFLEX MICROSCOPIC
BILIRUBIN URINE: NEGATIVE
GLUCOSE, UA: NEGATIVE mg/dL
Hgb urine dipstick: NEGATIVE
KETONES UR: NEGATIVE mg/dL
Nitrite: POSITIVE — AB
PH: 5.5 (ref 5.0–8.0)
Protein, ur: NEGATIVE mg/dL
Specific Gravity, Urine: 1.012 (ref 1.005–1.030)
Urobilinogen, UA: 0.2 mg/dL (ref 0.0–1.0)

## 2014-06-16 LAB — I-STAT TROPONIN, ED
Troponin i, poc: 0 ng/mL (ref 0.00–0.08)
Troponin i, poc: 0.06 ng/mL (ref 0.00–0.08)

## 2014-06-16 LAB — I-STAT ARTERIAL BLOOD GAS, ED
ACID-BASE DEFICIT: 5 mmol/L — AB (ref 0.0–2.0)
Acid-base deficit: 5 mmol/L — ABNORMAL HIGH (ref 0.0–2.0)
BICARBONATE: 22.9 meq/L (ref 20.0–24.0)
Bicarbonate: 23.4 mEq/L (ref 20.0–24.0)
O2 Saturation: 97 %
O2 Saturation: 99 %
PCO2 ART: 53.7 mmHg — AB (ref 35.0–45.0)
PH ART: 7.238 — AB (ref 7.350–7.450)
Patient temperature: 98.6
TCO2: 25 mmol/L (ref 0–100)
TCO2: 24 mmol/L (ref 0–100)
pCO2 arterial: 62.3 mmHg (ref 35.0–45.0)
pH, Arterial: 7.182 — CL (ref 7.350–7.450)
pO2, Arterial: 115 mmHg — ABNORMAL HIGH (ref 80.0–100.0)
pO2, Arterial: 167 mmHg — ABNORMAL HIGH (ref 80.0–100.0)

## 2014-06-16 LAB — COMPREHENSIVE METABOLIC PANEL
ALT: 10 U/L (ref 0–35)
ANION GAP: 16 — AB (ref 5–15)
AST: 19 U/L (ref 0–37)
Albumin: 3.4 g/dL — ABNORMAL LOW (ref 3.5–5.2)
Alkaline Phosphatase: 66 U/L (ref 39–117)
BILIRUBIN TOTAL: 0.2 mg/dL — AB (ref 0.3–1.2)
BUN: 40 mg/dL — AB (ref 6–23)
CALCIUM: 8.7 mg/dL (ref 8.4–10.5)
CHLORIDE: 97 meq/L (ref 96–112)
CO2: 24 mEq/L (ref 19–32)
CREATININE: 2.15 mg/dL — AB (ref 0.50–1.10)
GFR calc Af Amer: 23 mL/min — ABNORMAL LOW (ref >90)
GFR calc non Af Amer: 20 mL/min — ABNORMAL LOW (ref >90)
Glucose, Bld: 113 mg/dL — ABNORMAL HIGH (ref 70–99)
Potassium: 3.9 mEq/L (ref 3.7–5.3)
Sodium: 137 mEq/L (ref 137–147)
Total Protein: 7.1 g/dL (ref 6.0–8.3)

## 2014-06-16 LAB — BASIC METABOLIC PANEL
ANION GAP: 13 (ref 5–15)
BUN: 38 mg/dL — ABNORMAL HIGH (ref 6–23)
CALCIUM: 7.9 mg/dL — AB (ref 8.4–10.5)
CHLORIDE: 99 meq/L (ref 96–112)
CO2: 21 mEq/L (ref 19–32)
Creatinine, Ser: 2.16 mg/dL — ABNORMAL HIGH (ref 0.50–1.10)
GFR calc non Af Amer: 20 mL/min — ABNORMAL LOW (ref >90)
GFR, EST AFRICAN AMERICAN: 23 mL/min — AB (ref >90)
Glucose, Bld: 125 mg/dL — ABNORMAL HIGH (ref 70–99)
Potassium: 4 mEq/L (ref 3.7–5.3)
SODIUM: 133 meq/L — AB (ref 137–147)

## 2014-06-16 LAB — CBC WITH DIFFERENTIAL/PLATELET
BASOS PCT: 0 % (ref 0–1)
Basophils Absolute: 0 10*3/uL (ref 0.0–0.1)
EOS PCT: 3 % (ref 0–5)
Eosinophils Absolute: 0.2 10*3/uL (ref 0.0–0.7)
HEMATOCRIT: 28.7 % — AB (ref 36.0–46.0)
Hemoglobin: 9.4 g/dL — ABNORMAL LOW (ref 12.0–15.0)
Lymphocytes Relative: 6 % — ABNORMAL LOW (ref 12–46)
Lymphs Abs: 0.5 10*3/uL — ABNORMAL LOW (ref 0.7–4.0)
MCH: 29.6 pg (ref 26.0–34.0)
MCHC: 32.8 g/dL (ref 30.0–36.0)
MCV: 90.3 fL (ref 78.0–100.0)
MONO ABS: 0.8 10*3/uL (ref 0.1–1.0)
MONOS PCT: 10 % (ref 3–12)
NEUTROS PCT: 81 % — AB (ref 43–77)
Neutro Abs: 6.3 10*3/uL (ref 1.7–7.7)
Platelets: 297 10*3/uL (ref 150–400)
RBC: 3.18 MIL/uL — ABNORMAL LOW (ref 3.87–5.11)
RDW: 12.3 % (ref 11.5–15.5)
WBC: 7.8 10*3/uL (ref 4.0–10.5)

## 2014-06-16 LAB — PRO B NATRIURETIC PEPTIDE: PRO B NATRI PEPTIDE: 4799 pg/mL — AB (ref 0–450)

## 2014-06-16 LAB — TROPONIN I
TROPONIN I: 3.8 ng/mL — AB (ref <0.30)
Troponin I: 8.25 ng/mL (ref <0.30)

## 2014-06-16 LAB — URINE MICROSCOPIC-ADD ON

## 2014-06-16 LAB — GLUCOSE, CAPILLARY
GLUCOSE-CAPILLARY: 166 mg/dL — AB (ref 70–99)
Glucose-Capillary: 161 mg/dL — ABNORMAL HIGH (ref 70–99)

## 2014-06-16 LAB — MRSA PCR SCREENING: MRSA BY PCR: NEGATIVE

## 2014-06-16 MED ORDER — ROCURONIUM BROMIDE 50 MG/5ML IV SOLN
INTRAVENOUS | Status: DC | PRN
  Administered 2014-06-16: 30 mg via INTRAVENOUS

## 2014-06-16 MED ORDER — DEXTROSE 5 % IV SOLN
2.0000 ug/min | INTRAVENOUS | Status: DC
  Administered 2014-06-16: 10 ug/min via INTRAVENOUS
  Filled 2014-06-16: qty 4

## 2014-06-16 MED ORDER — IPRATROPIUM-ALBUTEROL 0.5-2.5 (3) MG/3ML IN SOLN: 3.0000 mL | Freq: Once | RESPIRATORY_TRACT | Status: DC

## 2014-06-16 MED ORDER — SODIUM CHLORIDE 0.9 % IV SOLN: INTRAVENOUS | Status: DC

## 2014-06-16 MED ORDER — VITAMIN D-3 125 MCG (5000 UT) PO TABS: 5000.0000 [IU] | ORAL_TABLET | Freq: Every day | ORAL | Status: DC

## 2014-06-16 MED ORDER — ACETAMINOPHEN 650 MG RE SUPP: 650.0000 mg | Freq: Four times a day (QID) | RECTAL | Status: DC | PRN

## 2014-06-16 MED ORDER — VANCOMYCIN HCL IN DEXTROSE 1-5 GM/200ML-% IV SOLN
1000.0000 mg | Freq: Once | INTRAVENOUS | Status: AC
  Administered 2014-06-16: 1000 mg via INTRAVENOUS
  Filled 2014-06-16: qty 200

## 2014-06-16 MED ORDER — ETOMIDATE 2 MG/ML IV SOLN
INTRAVENOUS | Status: AC
  Filled 2014-06-16: qty 20

## 2014-06-16 MED ORDER — CLOPIDOGREL BISULFATE 75 MG PO TABS: 75.0000 mg | ORAL_TABLET | Freq: Every day | ORAL | Status: DC

## 2014-06-16 MED ORDER — HEPARIN (PORCINE) IN NACL 100-0.45 UNIT/ML-% IJ SOLN
800.0000 [IU]/h | INTRAMUSCULAR | Status: DC
  Filled 2014-06-16: qty 250

## 2014-06-16 MED ORDER — ASPIRIN 81 MG PO CHEW: 324.0000 mg | CHEWABLE_TABLET | ORAL | Status: AC

## 2014-06-16 MED ORDER — HEPARIN SODIUM (PORCINE) 5000 UNIT/ML IJ SOLN: 5000.0000 [IU] | Freq: Three times a day (TID) | INTRAMUSCULAR | Status: DC

## 2014-06-16 MED ORDER — SODIUM CHLORIDE 0.9 % IJ SOLN
3.0000 mL | Freq: Two times a day (BID) | INTRAMUSCULAR | Status: DC
  Administered 2014-06-16 – 2014-06-19 (×8): 3 mL via INTRAVENOUS

## 2014-06-16 MED ORDER — PIPERACILLIN-TAZOBACTAM IN DEX 2-0.25 GM/50ML IV SOLN
2.2500 g | Freq: Four times a day (QID) | INTRAVENOUS | Status: DC
  Administered 2014-06-16 – 2014-06-19 (×12): 2.25 g via INTRAVENOUS
  Filled 2014-06-16 (×15): qty 50

## 2014-06-16 MED ORDER — ETOMIDATE 2 MG/ML IV SOLN
INTRAVENOUS | Status: DC | PRN
  Administered 2014-06-16: 10 mg via INTRAVENOUS

## 2014-06-16 MED ORDER — GI COCKTAIL ~~LOC~~
30.0000 mL | Freq: Two times a day (BID) | ORAL | Status: DC | PRN
  Filled 2014-06-16: qty 30

## 2014-06-16 MED ORDER — OMEGA-3 FATTY ACIDS 1000 MG PO CAPS: 1.0000 g | ORAL_CAPSULE | Freq: Two times a day (BID) | ORAL | Status: DC

## 2014-06-16 MED ORDER — ACETAMINOPHEN 325 MG PO TABS: 650.0000 mg | ORAL_TABLET | Freq: Four times a day (QID) | ORAL | Status: DC | PRN

## 2014-06-16 MED ORDER — ASPIRIN EC 81 MG PO TBEC: 81.0000 mg | DELAYED_RELEASE_TABLET | Freq: Every day | ORAL | Status: DC

## 2014-06-16 MED ORDER — VANCOMYCIN HCL IN DEXTROSE 750-5 MG/150ML-% IV SOLN: 750.0000 mg | INTRAVENOUS | Status: DC

## 2014-06-16 MED ORDER — SODIUM CHLORIDE 0.9 % IV BOLUS (SEPSIS)
1000.0000 mL | Freq: Once | INTRAVENOUS | Status: AC
  Administered 2014-06-16: 1000 mL via INTRAVENOUS

## 2014-06-16 MED ORDER — MIDAZOLAM HCL 2 MG/2ML IJ SOLN
4.0000 mg | Freq: Once | INTRAMUSCULAR | Status: DC
  Filled 2014-06-16: qty 4

## 2014-06-16 MED ORDER — NITROGLYCERIN 0.4 MG SL SUBL
0.4000 mg | SUBLINGUAL_TABLET | SUBLINGUAL | Status: DC | PRN
  Administered 2014-06-16 (×2): 0.4 mg via SUBLINGUAL
  Filled 2014-06-16: qty 1

## 2014-06-16 MED ORDER — ONDANSETRON HCL 4 MG/2ML IJ SOLN: 4.0000 mg | Freq: Four times a day (QID) | INTRAMUSCULAR | Status: DC | PRN

## 2014-06-16 MED ORDER — DEXTROSE 5 % IV SOLN
1.0000 g | Freq: Once | INTRAVENOUS | Status: DC
  Administered 2014-06-16: 1 g via INTRAVENOUS
  Filled 2014-06-16: qty 10

## 2014-06-16 MED ORDER — PANTOPRAZOLE SODIUM 40 MG PO TBEC: 40.0000 mg | DELAYED_RELEASE_TABLET | Freq: Every day | ORAL | Status: DC

## 2014-06-16 MED ORDER — ALBUTEROL SULFATE (2.5 MG/3ML) 0.083% IN NEBU: 2.5000 mg | INHALATION_SOLUTION | RESPIRATORY_TRACT | Status: DC | PRN

## 2014-06-16 MED ORDER — ISOSORBIDE MONONITRATE ER 60 MG PO TB24: 60.0000 mg | ORAL_TABLET | Freq: Every day | ORAL | Status: DC

## 2014-06-16 MED ORDER — ROCURONIUM BROMIDE 50 MG/5ML IV SOLN
INTRAVENOUS | Status: AC
  Filled 2014-06-16: qty 2

## 2014-06-16 MED ORDER — LEVOFLOXACIN IN D5W 500 MG/100ML IV SOLN: 500.0000 mg | INTRAVENOUS | Status: DC

## 2014-06-16 MED ORDER — HEPARIN BOLUS VIA INFUSION
2500.0000 [IU] | Freq: Once | INTRAVENOUS | Status: AC
  Administered 2014-06-16: 2500 [IU] via INTRAVENOUS
  Filled 2014-06-16: qty 2500

## 2014-06-16 MED ORDER — MIDAZOLAM HCL 2 MG/2ML IJ SOLN
INTRAMUSCULAR | Status: AC
  Administered 2014-06-16: 2 mg
  Filled 2014-06-16: qty 4

## 2014-06-16 MED ORDER — IPRATROPIUM-ALBUTEROL 0.5-2.5 (3) MG/3ML IN SOLN
RESPIRATORY_TRACT | Status: AC
  Filled 2014-06-16: qty 3

## 2014-06-16 MED ORDER — IPRATROPIUM-ALBUTEROL 0.5-2.5 (3) MG/3ML IN SOLN
3.0000 mL | Freq: Once | RESPIRATORY_TRACT | Status: AC
  Administered 2014-06-16: 3 mL via RESPIRATORY_TRACT

## 2014-06-16 MED ORDER — LIDOCAINE HCL (CARDIAC) 20 MG/ML IV SOLN
INTRAVENOUS | Status: AC
  Filled 2014-06-16: qty 5

## 2014-06-16 MED ORDER — NITROGLYCERIN 0.4 MG SL SUBL: 0.4000 mg | SUBLINGUAL_TABLET | SUBLINGUAL | Status: DC | PRN

## 2014-06-16 MED ORDER — SODIUM CHLORIDE 0.9 % IV SOLN: 250.0000 mL | INTRAVENOUS | Status: DC | PRN

## 2014-06-16 MED ORDER — FENTANYL CITRATE 0.05 MG/ML IJ SOLN
INTRAMUSCULAR | Status: AC
  Filled 2014-06-16: qty 4

## 2014-06-16 MED ORDER — FENTANYL CITRATE 0.05 MG/ML IJ SOLN
50.0000 ug | INTRAMUSCULAR | Status: DC | PRN
  Administered 2014-06-16 – 2014-06-17 (×3): 50 ug via INTRAVENOUS
  Filled 2014-06-16 (×4): qty 2

## 2014-06-16 MED ORDER — DEXTROSE 5 % IV SOLN: 1.0000 g | INTRAVENOUS | Status: DC

## 2014-06-16 MED ORDER — IPRATROPIUM-ALBUTEROL 0.5-2.5 (3) MG/3ML IN SOLN
3.0000 mL | Freq: Once | RESPIRATORY_TRACT | Status: AC
  Administered 2014-06-16: 3 mL via RESPIRATORY_TRACT
  Filled 2014-06-16: qty 3

## 2014-06-16 MED ORDER — SODIUM CHLORIDE 0.9 % IJ SOLN
3.0000 mL | Freq: Two times a day (BID) | INTRAMUSCULAR | Status: DC
  Administered 2014-06-16 – 2014-06-20 (×7): 3 mL via INTRAVENOUS

## 2014-06-16 MED ORDER — POTASSIUM CHLORIDE CRYS ER 20 MEQ PO TBCR: 20.0000 meq | EXTENDED_RELEASE_TABLET | Freq: Every day | ORAL | Status: DC

## 2014-06-16 MED ORDER — SUCCINYLCHOLINE CHLORIDE 20 MG/ML IJ SOLN
INTRAMUSCULAR | Status: AC
  Filled 2014-06-16: qty 1

## 2014-06-16 MED ORDER — BISOPROLOL FUMARATE 5 MG PO TABS
5.0000 mg | ORAL_TABLET | Freq: Every day | ORAL | Status: DC
  Filled 2014-06-16: qty 1

## 2014-06-16 MED ORDER — FUROSEMIDE 10 MG/ML IJ SOLN
INTRAMUSCULAR | Status: AC
  Filled 2014-06-16: qty 4

## 2014-06-16 MED ORDER — ALBUTEROL SULFATE HFA 108 (90 BASE) MCG/ACT IN AERS
4.0000 | INHALATION_SPRAY | Freq: Once | RESPIRATORY_TRACT | Status: AC
  Administered 2014-06-16: 4 via RESPIRATORY_TRACT
  Filled 2014-06-16: qty 6.7

## 2014-06-16 MED ORDER — BIOTENE DRY MOUTH MT LIQD
15.0000 mL | Freq: Four times a day (QID) | OROMUCOSAL | Status: DC
  Administered 2014-06-16 – 2014-06-18 (×9): 15 mL via OROMUCOSAL

## 2014-06-16 MED ORDER — OXYCODONE HCL 5 MG PO TABS: 5.0000 mg | ORAL_TABLET | ORAL | Status: DC | PRN

## 2014-06-16 MED ORDER — LORATADINE 10 MG PO TABS: 10.0000 mg | ORAL_TABLET | ORAL | Status: DC | PRN

## 2014-06-16 MED ORDER — CHLORHEXIDINE GLUCONATE 0.12 % MT SOLN
15.0000 mL | Freq: Two times a day (BID) | OROMUCOSAL | Status: DC
  Administered 2014-06-16 – 2014-06-18 (×4): 15 mL via OROMUCOSAL
  Filled 2014-06-16 (×4): qty 15

## 2014-06-16 MED ORDER — SODIUM CHLORIDE 0.9 % IJ SOLN
3.0000 mL | INTRAMUSCULAR | Status: DC | PRN
  Administered 2014-06-16 (×2): 10 mL via INTRAVENOUS

## 2014-06-16 MED ORDER — ASPIRIN 81 MG PO CHEW
81.0000 mg | CHEWABLE_TABLET | Freq: Every day | ORAL | Status: DC
  Administered 2014-06-17 – 2014-06-20 (×4): 81 mg via ORAL
  Filled 2014-06-16 (×4): qty 1

## 2014-06-16 MED ORDER — GUAIFENESIN 100 MG/5ML PO SYRP
100.0000 mg | ORAL_SOLUTION | Freq: Three times a day (TID) | ORAL | Status: DC | PRN
  Filled 2014-06-16: qty 5

## 2014-06-16 MED ORDER — ONDANSETRON HCL 4 MG PO TABS: 4.0000 mg | ORAL_TABLET | Freq: Four times a day (QID) | ORAL | Status: DC | PRN

## 2014-06-16 MED ORDER — HYDROCORTISONE NA SUCCINATE PF 100 MG IJ SOLR
50.0000 mg | Freq: Four times a day (QID) | INTRAMUSCULAR | Status: DC
  Administered 2014-06-16 – 2014-06-17 (×4): 50 mg via INTRAVENOUS
  Filled 2014-06-16 (×7): qty 1
  Filled 2014-06-16: qty 2

## 2014-06-16 MED ORDER — HYDRALAZINE HCL 25 MG PO TABS: 25.0000 mg | ORAL_TABLET | Freq: Two times a day (BID) | ORAL | Status: DC

## 2014-06-16 MED ORDER — ALUM & MAG HYDROXIDE-SIMETH 200-200-20 MG/5ML PO SUSP
30.0000 mL | Freq: Four times a day (QID) | ORAL | Status: DC | PRN
  Filled 2014-06-16: qty 30

## 2014-06-16 MED ORDER — HEPARIN (PORCINE) IN NACL 100-0.45 UNIT/ML-% IJ SOLN
700.0000 [IU]/h | INTRAMUSCULAR | Status: DC
  Administered 2014-06-16 – 2014-06-17 (×2): 700 [IU]/h via INTRAVENOUS
  Filled 2014-06-16 (×3): qty 250

## 2014-06-16 MED ORDER — B COMPLEX-C PO TABS
1.0000 | ORAL_TABLET | Freq: Every day | ORAL | Status: DC
  Administered 2014-06-16 – 2014-06-20 (×5): 1 via ORAL
  Filled 2014-06-16 (×6): qty 1

## 2014-06-16 MED ORDER — NITROGLYCERIN IN D5W 200-5 MCG/ML-% IV SOLN
2.0000 ug/min | INTRAVENOUS | Status: DC
  Administered 2014-06-16: 50 ug/min via INTRAVENOUS
  Filled 2014-06-16: qty 250

## 2014-06-16 MED ORDER — FUROSEMIDE 10 MG/ML IJ SOLN
40.0000 mg | Freq: Once | INTRAMUSCULAR | Status: AC
  Administered 2014-06-16: 40 mg via INTRAVENOUS

## 2014-06-16 MED ORDER — PANTOPRAZOLE SODIUM 40 MG IV SOLR
40.0000 mg | Freq: Every day | INTRAVENOUS | Status: DC
  Administered 2014-06-16 – 2014-06-18 (×3): 40 mg via INTRAVENOUS
  Filled 2014-06-16 (×4): qty 40

## 2014-06-16 MED ORDER — FUROSEMIDE 10 MG/ML IJ SOLN: 40.0000 mg | Freq: Two times a day (BID) | INTRAMUSCULAR | Status: DC

## 2014-06-16 MED ORDER — ONDANSETRON HCL 4 MG/2ML IJ SOLN
4.0000 mg | Freq: Once | INTRAMUSCULAR | Status: AC
  Administered 2014-06-16: 4 mg via INTRAVENOUS
  Filled 2014-06-16: qty 2

## 2014-06-16 MED ORDER — ASPIRIN 300 MG RE SUPP
300.0000 mg | RECTAL | Status: AC
  Administered 2014-06-16: 300 mg via RECTAL

## 2014-06-16 MED ORDER — ASPIRIN 300 MG RE SUPP
300.0000 mg | Freq: Once | RECTAL | Status: AC
  Administered 2014-06-16: 300 mg via RECTAL
  Filled 2014-06-16: qty 1

## 2014-06-16 MED ORDER — LEVOFLOXACIN IN D5W 750 MG/150ML IV SOLN
750.0000 mg | INTRAVENOUS | Status: DC
  Administered 2014-06-16: 750 mg via INTRAVENOUS
  Filled 2014-06-16: qty 150

## 2014-06-16 MED ORDER — FENTANYL CITRATE 0.05 MG/ML IJ SOLN
100.0000 ug | Freq: Once | INTRAMUSCULAR | Status: AC
Start: 2014-06-16 — End: 2014-06-16
  Administered 2014-06-16: 100 ug via INTRAVENOUS

## 2014-06-16 NOTE — Progress Notes (Signed)
UR Completed.  [name] [name] [phone] 06/16/2014  

## 2014-06-16 NOTE — ED Notes (Signed)
RT note: Pt. transported from Pod-A10 to 2M02 no complications.

## 2014-06-16 NOTE — H&P (Signed)
[facility] History and Physical                                                                                    Patient Demographics  [name], is a 80 y.o. female  MRN: [id number]   DOB - [birth year]  Admit Date - 06/16/2014  Outpatient Primary MD for the patient is [name], MD   With History of -  Past Medical History  Diagnosis Date  . Lower extremity edema     bilateral  . CKD (chronic kidney disease), stage IV     a. baseline creat previously 1.6->2 - elevated during 2013/04/22 adm.  . Labile hypertension   . Anxiety   . Failure to thrive in childhood   . History of shingles July 2004    on the left side of her thorax and postherpetic neuralgia  . History of psoriasis   . History of recurrent UTIs   . Major depressive disorder, single episode, severe 04-23-2003    "when my husband died"  . Valvular heart disease     a.  11/2012 Echo: Mild AS/AI/MR;  b. 02/2013 Echo: EF 55-60%, mildly dil LA, calcified AV/MV.  Marland Kitchen CAD (coronary artery disease)     a. NSTEMI 05/2012 s/p DES to OM (residual severe LCx subbranch dz for med rx unless refractory angina recurs) b. NSTEMI 04/22/2013 felt 2/2 demand in setting of resp failure, for med rx.  Marland Kitchen LBBB (left bundle branch block)     intermittent  . Chronic combined systolic and diastolic CHF (congestive heart failure)     a. 05/2012 Echo: EF 60-65%;  b. 07/2012 Echo in setting of afib: EF 35%;  c. 02/2013 Echo: EF 55-60%, mildly dil LA.  Marland Kitchen PAF (paroxysmal atrial fibrillation)     a. Dx 07/2012 -> Amio started but later dc'd 2/2 nausea/NSR. b. 02/2013 Coumadin initiated, dc'd 22-Apr-2013 2/2 allergic reaction. c. Ultimately not on NOAC due to age, Coumadin allergy and low CrCl.  Marland Kitchen Respiratory failure     a. Requiring intubation 2013/04/22, secondary to cardiogenic pulmonary edema.      Past Surgical History  Procedure Laterality Date  . Salpingoophorectomy      "cyst removed"  . Appendectomy    . Breast cyst excision    . Abdominal  hysterectomy    . Cataract extraction, bilateral  ~ 04/22/2009  . Cardiac surgery      in for   Chief Complaint  Patient presents with  . Nausea  . Cough     HPI  [name]  is a 80 y.o. female, with a history of respiratory failure (requiring intubation 2x) combined systolic and diastolic heart failure, chronic kidney disease, p-afib, and CAD, who presents with cough, presyncope and burning chest pain. She is not on warfarin due to a severe skin allergy and has not been started on a new anticoagulant agent due to CKD.   [name] reports that her cough started approximately one week ago.  Robitussin did not help it.  Last night her cough was preventing her from sleep so she began sipping the robitussin.  This am she developed burning chest pain with severe  nausea but did not vomit.  She became dizzy and was brought to the ER by ambulance.  In the ER she presents with mildly labored breathing and mild interstitial pulmonary edema on CXR.  Burning chest pain was relieved by nitro glycerin.  Her hgb appears to have decreased.  The patient's daughter mentions that she has had dark stools recently.   Shortly after my exam the patient developed respiratory distress from apparent flash pulmonary edema.  She became acidotic  and required admission to the ICU and intubation.  Review of Systems    In addition to the HPI above she reports feeling poorly in general. No Fever-chills, No Headache, No changes with Vision or hearing, No problems swallowing food or Liquids, No Abdominal pain, No Nausea or Vomiting, Bowel movements are regular, (although daughter reports they have been dark) No Blood in stool or Urine, No dysuria, No new skin rashes or bruises, No new joints pains-aches,  No new weakness, tingling, numbness in any extremity, No recent weight gain or loss, No polyuria, polydypsia or polyphagia,   A full 10 point Review of Systems was done, except as stated above, all other  Review of Systems were negative.   Social History History  Substance Use Topics  . Smoking status: Former Smoker -- 1.50 packs/day for 30 years    Types: Cigarettes    Quit date: 09/01/1979  . Smokeless tobacco: Never Used  . Alcohol Use: Yes     Comment: 06/06/12 "mixed drink once or twice a year.to celebrate"     Family History Family History  Problem Relation Age of Onset  . Diabetes    . Cancer    . Lung cancer    Mother and father both died of stroke.   Prior to Admission medications   Medication Sig Start Date End Date Taking? Authorizing Provider  aspirin EC 81 MG tablet Take 1 tablet (81 mg total) by mouth daily. 06/08/12  Yes Dayna N Dunn, PA-C  b complex vitamins tablet Take 1 tablet by mouth daily.     Yes Historical Provider, MD  bisoprolol (ZEBETA) 5 MG tablet Take 5 mg by mouth daily.   Yes Historical Provider, MD  Cholecalciferol (VITAMIN D-3) 5000 UNITS TABS Take 5,000 Units by mouth daily.    Yes Historical Provider, MD  clopidogrel (PLAVIX) 75 MG tablet Take 1 tablet (75 mg total) by mouth daily with breakfast. 04/11/13  Yes Dayna N Dunn, PA-C  fish oil-omega-3 fatty acids 1000 MG capsule Take 1 g by mouth 2 (two) times daily.    Yes Historical Provider, MD  furosemide (LASIX) 40 MG tablet Take 40 mg by mouth daily.   Yes Historical Provider, MD  guaifenesin (ROBITUSSIN) 100 MG/5ML syrup Take 100 mg by mouth 3 (three) times daily as needed for cough.   Yes Historical Provider, MD  hydrALAZINE (APRESOLINE) 25 MG tablet Take 1 tablet (25 mg total) by mouth 2 (two) times daily. 04/17/14  Yes Rosalio MacadamiaLori C Gerhardt, NP  isosorbide mononitrate (IMDUR) 60 MG 24 hr tablet Take 1 tablet (60 mg total) by mouth daily. 04/29/14  Yes Tonny BollmanMichael Cooper, MD  loratadine (CLARITIN) 10 MG tablet Take 1 tablet (10 mg total) by mouth every other day as needed for allergies (Take in AM (on days when taking)). 04/29/13  Yes Pricilla RifflePaula Ross V, MD  nitroGLYCERIN (NITROSTAT) 0.4 MG SL tablet Place 1 tablet  (0.4 mg total) under the tongue every 5 (five) minutes x 3 doses as needed for chest pain.  02/07/14 10/09/15 Yes [name], MD    Allergies  Allergen Reactions  . Metoprolol Swelling    Takes bisoprolol without issue  . Norvasc [Amlodipine Besylate] Swelling    Takes nisoldipine without issue  . Sulfonamide Derivatives Hives, Itching and Swelling  . Amiodarone Nausea And Vomiting  . Medrol [Methylprednisolone]     Fluid retention/CHF exacerbation - caution with steroids in future if ever needed  . Statins     Myalgias - "I won't take them"  . Coumadin [Warfarin Sodium] Rash  . Latex Itching    Physical Exam  Vitals  Blood pressure 110/68, pulse 93, temperature 97.8 F (36.6 C), temperature source Oral, resp. rate 28, SpO2 94.00%.   General:  lying in bed in appears weak and fatigued.  Psych:  Normal affect and insight, Not Suicidal or Homicidal, Awake Alert, Oriented X 3.  Neuro:   No F.N deficits, ALL C.Nerves Intact, Strength 5/5 all 4 extremities, Sensation intact all 4 extremities  ENT:  Ears and Eyes appear Normal, Conjunctivae clear, PERRLA. Moist Oral Mucosa.  Neck:  Supple Neck, No JVD, +bilateral cervical lymphadenopathy appreciated, No Carotid Bruits.  Respiratory:  Symmetrical Chest wall movement, expiratory wheeze / rals heard particularly on the right.  Cardiac:  irreg irreg, +systolic murmur 2-3/6. No lower extremity edema.  Abdomen:  Positive Bowel Sounds, Abdomen Soft, Non tender, No organomegaly appreciated  Skin:  No Cyanosis, Normal Skin Turgor, No Skin Rash or Bruise.  Extremities:  Good muscle tone,  joints appear normal    Data Review  CBC  Recent Labs Lab 06/16/14 0751  WBC 7.8  HGB 9.4*  HCT 28.7*  PLT 297  MCV 90.3  MCH 29.6  MCHC 32.8  RDW 12.3  LYMPHSABS 0.5*  MONOABS 0.8  EOSABS 0.2  BASOSABS 0.0    ------------------------------------------------------------------------------------------------------------------  Chemistries   Recent Labs Lab 06/16/14 0751  NA 137  K 3.9  CL 97  CO2 24  GLUCOSE 113*  BUN 40*  CREATININE 2.15*  CALCIUM 8.7  AST 19  ALT 10  ALKPHOS 66  BILITOT 0.2*     ---------------------------------------------------------------------------------------------------------------  Urinalysis    Component Value Date/Time   COLORURINE YELLOW 06/16/2014 0915   APPEARANCEUR CLOUDY* 06/16/2014 0915   LABSPEC 1.012 06/16/2014 0915   PHURINE 5.5 06/16/2014 0915   GLUCOSEU NEGATIVE 06/16/2014 0915   HGBUR NEGATIVE 06/16/2014 0915   BILIRUBINUR NEGATIVE 06/16/2014 0915   KETONESUR NEGATIVE 06/16/2014 0915   PROTEINUR NEGATIVE 06/16/2014 0915   UROBILINOGEN 0.2 06/16/2014 0915   NITRITE POSITIVE* 06/16/2014 0915   LEUKOCYTESUR LARGE* 06/16/2014 0915    ----------------------------------------------------------------------------------------------------------------  Imaging results:   Dg Chest 2 View  06/16/2014   CLINICAL DATA:  Cough, nausea  EXAM: CHEST  2 VIEW  COMPARISON:  04/10/2013  FINDINGS: The aorta is unfolded and ectatic. Mild moderate enlargement of the cardiac silhouette is noted. Central vascular congestion is identified with peripheral Kerley B-lines compatible with interstitial edema. Trace effusions are noted. Hyperinflation suggests emphysema. Atheromatous aortic calcification noted without calcified aneurysm.  IMPRESSION: Cardiomegaly with mild interstitial pulmonary edema and trace pleural effusions.   Electronically Signed   By: Christiana Pellant M.D.   On: 06/16/2014 08:27    My personal review of EKG: Rhythm LBBB with LVH noted.    Assessment & Plan  Principal Problem:   UTI (lower urinary tract infection) Active Problems:   Acute respiratory failure   Chest pain   Acute on chronic combined systolic and diastolic CHF, NYHA class  3    CKD (chronic kidney disease), stage IV   Acute pulmonary edema   Acute combined systolic and diastolic heart failure. Uncertain etiology.  Patient is compliant with diet and medications. Will cycle troponin. BNP elevated but not significantly higher than normal. Will give IV lasix 40 mg IV bid.  1st dose now. Already on BB.  Kidney disease prohibits ACE. Most recent echo 03/2014 showed global hypokinesis and 40 -45% LVEF  Acute pulmonary edema with acidotic respiratory failure. Secondary to Acute CHF. ABG pending Critical Care Pulmonary Medicine called for consultation. (patient has required intubation 2x previously) IV lasix  Breathing treatments Breathing support as appropriate. (Patient is a DNR)  Burning chest pain. Patient has a history of CAD and GERD. Cycle troponin. Ordered protonix and GI cocktail PRN  UTI Started on Levaquin in ER.  Will continue per pharmacy. Culture ordered.  CAD / P Afib History of PCI with Stents.  On aspirin and plavix. No long term anticoagulation as noted above.   Will place on Heparin for VTE prophylaxis.  Monitor closely and dtr reports dark stools.  CKD Stable.  Baseline creatinine appears to be approximately 2.5  Normocytic anemia Likely due to CKD and Aortic stenosis. Will guiac stool and place on protonix. Monitor closely as patient is on aspirin / plavix and heparin.  URI  With cough and enlarged anterior lymph nodes. On levaquin. Supportive care with robitussin.    DVT Prophylaxis Heparin   AM Labs Ordered, also please review Full Orders  Family Communication:   Daughter Aurther Loft at bedside.   Code Status:  FULL CODE  Likely DC to  Home vs SNF pending medical evolution.  Condition:  Critical   Time spent in minutes : 60    York, Tora Kindred PA-C on 06/16/2014 at 12:16 PM  Between 7am to 7pm - Pager - [phone]  After 7pm go to [email or web address] - password [email or web address]  And look for the night coverage person covering me  after hours  [facility] Office  [phone]  Attending Patient was seen, examined,treatment plan was discussed with the Physician extender. I have directly reviewed the clinical findings, lab, imaging studies and management of this patient in detail. I have made the necessary changes to the above noted documentation, and agree with the documentation, as recorded by the Physician extender.  Windell Norfolk MD [facility].

## 2014-06-16 NOTE — ED Notes (Signed)
RSI successful. 7.5 ET tube, 23 @ lip. Positive color change, equal bilateral lung sounds. 

## 2014-06-16 NOTE — ED Provider Notes (Signed)
CSN: [id number]     Arrival date & time 06/16/14  0707 History   First MD Initiated Contact with Patient 06/16/14 [id number]     Chief Complaint  Patient presents with  . Nausea  . Cough     (Consider location/radiation/quality/duration/timing/severity/associated sxs/prior Treatment) HPI Comments: Pt reports about 1 week of dry cough, with onset of nausea this morning.  She states she went into the bathroom, was retching, then became cold, diaphoretic and felt like she would pass out. She sat down and then felt very hot w/ a burning sensation across her chest. She took 1 SL NTG then felt somewhat better. Pt given 325 ASA by EMS. She states she still doesn't feel good, but is no longer having nausea or burning across chest. She reports chronic anginal symptoms w/ minimal exertion which have been improved recently by starting Imdur. Denies fever, leg swelling SOB above her baseline SOB w/ exertion. Chronic unchanged d/a. No urinary symptoms.    Patient is a 80 y.o. female presenting with cough.  Cough Associated symptoms: chest pain (described as burnign sensation)   Associated symptoms: no chills, no diaphoresis, no eye discharge, no fever, no headaches, no rhinorrhea, no shortness of breath and no sore throat     Past Medical History  Diagnosis Date  . Lower extremity edema     bilateral  . CKD (chronic kidney disease), stage IV     a. baseline creat previously 1.6->2 - elevated during 04-30-2013 adm.  . Labile hypertension   . Anxiety   . Failure to thrive in childhood   . History of shingles July 2004    on the left side of her thorax and postherpetic neuralgia  . History of psoriasis   . History of recurrent UTIs   . Major depressive disorder, single episode, severe May 01, 2003    "when my husband died"  . Valvular heart disease     a.  11/2012 Echo: Mild AS/AI/MR;  b. 02/2013 Echo: EF 55-60%, mildly dil LA, calcified AV/MV.  Marland Kitchen CAD (coronary artery disease)     a. NSTEMI 05/2012 s/p DES to OM  (residual severe LCx subbranch dz for med rx unless refractory angina recurs) b. NSTEMI 30-Apr-2013 felt 2/2 demand in setting of resp failure, for med rx.  Marland Kitchen LBBB (left bundle branch block)     intermittent  . Chronic combined systolic and diastolic CHF (congestive heart failure)     a. 05/2012 Echo: EF 60-65%;  b. 07/2012 Echo in setting of afib: EF 35%;  c. 02/2013 Echo: EF 55-60%, mildly dil LA.  Marland Kitchen PAF (paroxysmal atrial fibrillation)     a. Dx 07/2012 -> Amio started but later dc'd 2/2 nausea/NSR. b. 02/2013 Coumadin initiated, dc'd April 30, 2013 2/2 allergic reaction. c. Ultimately not on NOAC due to age, Coumadin allergy and low CrCl.  Marland Kitchen Respiratory failure     a. Requiring intubation 04/30/13, secondary to cardiogenic pulmonary edema.   Past Surgical History  Procedure Laterality Date  . Salpingoophorectomy      "cyst removed"  . Appendectomy    . Breast cyst excision    . Abdominal hysterectomy    . Cataract extraction, bilateral  ~ 2009-04-30  . Cardiac surgery     Family History  Problem Relation Age of Onset  . Diabetes    . Cancer    . Lung cancer     History  Substance Use Topics  . Smoking status: Former Smoker -- 1.50 packs/day for 30 years  Types: Cigarettes    Quit date: 09/01/1979  . Smokeless tobacco: Never Used  . Alcohol Use: Yes     Comment: 06/06/12 "mixed drink once or twice a year.to celebrate"   OB History   Grav Para Term Preterm Abortions TAB SAB Ect Mult Living                 Review of Systems  Constitutional: Negative for fever, chills, diaphoresis, activity change, appetite change and fatigue.  HENT: Negative for congestion, facial swelling, rhinorrhea and sore throat.   Eyes: Negative for photophobia and discharge.  Respiratory: Positive for cough. Negative for chest tightness and shortness of breath.   Cardiovascular: Positive for chest pain (described as burnign sensation). Negative for palpitations and leg swelling.  Gastrointestinal: Positive for nausea.  Negative for vomiting, abdominal pain and diarrhea.  Endocrine: Negative for polydipsia and polyuria.  Genitourinary: Negative for dysuria, frequency, difficulty urinating and pelvic pain.  Musculoskeletal: Negative for arthralgias, back pain, neck pain and neck stiffness.  Skin: Negative for color change and wound.  Allergic/Immunologic: Negative for immunocompromised state.  Neurological: Negative for facial asymmetry, weakness, numbness and headaches.  Hematological: Does not bruise/bleed easily.  Psychiatric/Behavioral: Negative for confusion and agitation.      Allergies  Metoprolol; Norvasc; Sulfonamide derivatives; Amiodarone; Medrol; Statins; Coumadin; and Latex  Home Medications   Prior to Admission medications   Medication Sig Start Date End Date Taking? Authorizing Provider  aspirin EC 81 MG tablet Take 1 tablet (81 mg total) by mouth daily. 06/08/12  Yes Dayna N Dunn, PA-C  b complex vitamins tablet Take 1 tablet by mouth daily.     Yes Historical Provider, MD  bisoprolol (ZEBETA) 5 MG tablet Take 5 mg by mouth daily.   Yes Historical Provider, MD  Cholecalciferol (VITAMIN D-3) 5000 UNITS TABS Take 5,000 Units by mouth daily.    Yes Historical Provider, MD  clopidogrel (PLAVIX) 75 MG tablet Take 1 tablet (75 mg total) by mouth daily with breakfast. 04/11/13  Yes Dayna N Dunn, PA-C  fish oil-omega-3 fatty acids 1000 MG capsule Take 1 g by mouth 2 (two) times daily.    Yes Historical Provider, MD  furosemide (LASIX) 40 MG tablet Take 40 mg by mouth daily.   Yes Historical Provider, MD  guaifenesin (ROBITUSSIN) 100 MG/5ML syrup Take 100 mg by mouth 3 (three) times daily as needed for cough.   Yes Historical Provider, MD  hydrALAZINE (APRESOLINE) 25 MG tablet Take 1 tablet (25 mg total) by mouth 2 (two) times daily. 04/17/14  Yes Rosalio Macadamia, NP  isosorbide mononitrate (IMDUR) 60 MG 24 hr tablet Take 1 tablet (60 mg total) by mouth daily. 04/29/14  Yes Tonny Bollman, MD   loratadine (CLARITIN) 10 MG tablet Take 1 tablet (10 mg total) by mouth every other day as needed for allergies (Take in AM (on days when taking)). 04/29/13  Yes Pricilla Riffle, MD  nitroGLYCERIN (NITROSTAT) 0.4 MG SL tablet Place 1 tablet (0.4 mg total) under the tongue every 5 (five) minutes x 3 doses as needed for chest pain. 02/07/14 10/09/15 Yes [name], MD   BP 137/51  Pulse 71  Temp(Src) 98.2 F (36.8 C) (Core (Comment))  Resp 19  SpO2 100% Physical Exam  Constitutional: She is oriented to person, place, and time. She appears well-developed and well-nourished. No distress.  Speaks with eyes closed, will not make eye contact  HENT:  Head: Normocephalic and atraumatic.  Mouth/Throat: No oropharyngeal exudate.  Eyes: Pupils are equal, round, and reactive to light.  Neck: Normal range of motion. Neck supple.  Cardiovascular: Normal rate, regular rhythm and normal heart sounds.  Exam reveals no gallop and no friction rub.   No murmur heard. Pulmonary/Chest: Effort normal. No respiratory distress. She has wheezes in the right middle field, the right lower field, the left middle field and the left lower field. She has no rales.  Abdominal: Soft. Bowel sounds are normal. She exhibits no distension and no mass. There is no tenderness. There is no rebound and no guarding.  Musculoskeletal: Normal range of motion. She exhibits no edema and no tenderness.  Neurological: She is alert and oriented to person, place, and time.  Skin: Skin is warm and dry.  Psychiatric: She has a normal mood and affect.    ED Course  Procedures (including critical care time) Labs Review Labs Reviewed  CBC WITH DIFFERENTIAL - Abnormal; Notable for the following:    RBC 3.18 (*)    Hemoglobin 9.4 (*)    HCT 28.7 (*)    Neutrophils Relative % 81 (*)    Lymphocytes Relative 6 (*)    Lymphs Abs 0.5 (*)    All other components within normal limits  URINALYSIS, ROUTINE W REFLEX MICROSCOPIC - Abnormal;  Notable for the following:    APPearance CLOUDY (*)    Nitrite POSITIVE (*)    Leukocytes, UA LARGE (*)    All other components within normal limits  COMPREHENSIVE METABOLIC PANEL - Abnormal; Notable for the following:    Glucose, Bld 113 (*)    BUN 40 (*)    Creatinine, Ser 2.15 (*)    Albumin 3.4 (*)    Total Bilirubin 0.2 (*)    GFR calc non Af Amer 20 (*)    GFR calc Af Amer 23 (*)    Anion gap 16 (*)    All other components within normal limits  PRO B NATRIURETIC PEPTIDE - Abnormal; Notable for the following:    Pro B Natriuretic peptide (BNP) 4799.0 (*)    All other components within normal limits  URINE MICROSCOPIC-ADD ON - Abnormal; Notable for the following:    Bacteria, UA MANY (*)    All other components within normal limits  TROPONIN I - Abnormal; Notable for the following:    Troponin I 3.80 (*)    All other components within normal limits  GLUCOSE, CAPILLARY - Abnormal; Notable for the following:    Glucose-Capillary 166 (*)    All other components within normal limits  GLUCOSE, CAPILLARY - Abnormal; Notable for the following:    Glucose-Capillary 161 (*)    All other components within normal limits  I-STAT ARTERIAL BLOOD GAS, ED - Abnormal; Notable for the following:    pH, Arterial 7.182 (*)    pCO2 arterial 62.3 (*)    pO2, Arterial 115.0 (*)    Acid-base deficit 5.0 (*)    All other components within normal limits  I-STAT ARTERIAL BLOOD GAS, ED - Abnormal; Notable for the following:    pH, Arterial 7.238 (*)    pCO2 arterial 53.7 (*)    pO2, Arterial 167.0 (*)    Acid-base deficit 5.0 (*)    All other components within normal limits  MRSA PCR SCREENING  URINE CULTURE  CULTURE, BLOOD (ROUTINE X 2)  CULTURE, BLOOD (ROUTINE X 2)  HEPARIN LEVEL (UNFRACTIONATED)  CORTISOL  TROPONIN I  TROPONIN I  OCCULT BLOOD X 1 CARD TO LAB, STOOL  BASIC  METABOLIC PANEL  CBC  BASIC METABOLIC PANEL  BLOOD GAS, ARTERIAL  MAGNESIUM  PHOSPHORUS  I-STAT TROPOININ, ED   Rosezena Sensor, ED    Imaging Review Dg Chest 2 View  06/16/2014   CLINICAL DATA:  Cough, nausea  EXAM: CHEST  2 VIEW  COMPARISON:  04/10/2013  FINDINGS: The aorta is unfolded and ectatic. Mild moderate enlargement of the cardiac silhouette is noted. Central vascular congestion is identified with peripheral Kerley B-lines compatible with interstitial edema. Trace effusions are noted. Hyperinflation suggests emphysema. Atheromatous aortic calcification noted without calcified aneurysm.  IMPRESSION: Cardiomegaly with mild interstitial pulmonary edema and trace pleural effusions.   Electronically Signed   By: Christiana Pellant M.D.   On: 06/16/2014 08:27   Dg Chest Port 1 View  06/16/2014   CLINICAL DATA:  EvaluateRUTLC  EXAM: PORTABLE CHEST - 1 VIEW  COMPARISON:  Chest radiograph 06/16/2014  FINDINGS: Interval intubation with endotracheal tube terminating within the mid trachea. Enteric tube courses inferior to the diaphragm, tip not included on this examination. Right IJ central venous catheter tip projects at the superior cavoatrial junction.  Stable cardiac and mediastinal contours. Slight interval improvement in bilateral interstitial pulmonary opacities. Persistent retrocardiac consolidative opacity. No definite pleural effusion or pneumothorax.  IMPRESSION: ET tube terminates in the mid trachea.  Enteric tube courses inferior to the diaphragm, tip not included on this examination.  Right IJ central venous catheter tip projects at the superior cavoatrial junction. No definite right-sided pneumothorax.  Persistent consolidation in the retrocardiac region may represent atelectasis or infection.  Slight improvement parahilar interstitial opacities, potentially improving pulmonary edema.   Electronically Signed   By: Annia Belt M.D.   On: 06/16/2014 13:49   Dg Chest Portable 1 View  06/16/2014   CLINICAL DATA:  Chest pain, shortness of breath.  EXAM: PORTABLE CHEST - 1 VIEW  COMPARISON:  Chest  radiograph 06/16/2014.  FINDINGS: Stable cardiac and mediastinal contours. Interval increase in bilateral interstitial pulmonary opacities. Retrocardiac consolidation with obscuration of the left hemidiaphragm. No definite pleural effusion or pneumothorax.  IMPRESSION: Interval development of bilateral interstitial pulmonary opacities, suggestive of congestive heart failure.  Retrocardiac consolidative opacity favored to represent atelectasis. Infection not excluded.   Electronically Signed   By: Annia Belt M.D.   On: 06/16/2014 12:33   Dg Abd Portable 1v  06/16/2014   CLINICAL DATA:  OG tube placement.  EXAM: PORTABLE ABDOMEN - 1 VIEW  COMPARISON:  CT abdomen and pelvis 10/11/2003.  FINDINGS: OG tube is in place with the tip in the body of the stomach. Left basilar airspace disease is noted.  IMPRESSION: NG tube tip is in the stomach.  Left basilar airspace disease.   Electronically Signed   By: Drusilla Kanner M.D.   On: 06/16/2014 15:58     EKG Interpretation   Date/Time:  Monday June 16 2014 07:43:23 EDT Ventricular Rate:  84 PR Interval:  187 QRS Duration: 163 QT Interval:  435 QTC Calculation: 514 R Axis:   -39 Text Interpretation:  Sinus rhythm Atrial premature complex Consider left  atrial enlargement Left bundle branch block which is changed from prior  since may'14/'15, but has had on priors Baseline wander in lead(s) V6  Reconfirmed by DOCHERTY  MD, MEGAN ([id number]) on 06/16/2014 7:51:05 AM Also  confirmed by DOCHERTY  MD, MEGAN [phone])  on 06/16/2014 11:28:07 AM      CRITICAL CARE Performed by: Toy Cookey, E Total critical care time: 40 Critical care time was exclusive of  separately billable procedures and treating other patients. Critical care was necessary to treat or prevent imminent or life-threatening deterioration. Critical care was time spent personally by me on the following activities: development of treatment plan with patient and/or surrogate as well as nursing,  discussions with consultants, evaluation of patient's response to treatment, examination of patient, obtaining history from patient or surrogate, ordering and performing treatments and interventions, ordering and review of laboratory studies, ordering and review of radiographic studies, pulse oximetry and re-evaluation of patient's condition.  MDM   Final diagnoses:  UTI (lower urinary tract infection)  Atypical pneumonia  Near syncope  Chest pain, unspecified chest pain type  Acute on chronic combined systolic and diastolic CHF, NYHA class 3  Acute pulmonary edema  Acute respiratory failure with hypoxia  Septic shock  Acute respiratory failure with hypercapnia  Coronary artery disease involving native coronary artery of native heart with angina pectoris with documented spasm  Unstable angina    Pt is a 80 y.o. female with Pmhx as above who presents with about 1 week of dry cough, with onset of nausea this morning. She had near syncopal episode after retching int he bathroom that was followed by a burning sensation across her chest that was improved after 1 SL NTG. Current no CP, nausea, SOB. On PE, VSS, pt in NAD. Wheezing heard on pulm exam. No signs of fluid overload on PE. In chart review, last EF 40-45% about 2 months ago.   11:23 Pt having episode of chest "burning, diaphoresis, SOB, nausea. +wheezing, mild crackles at bases, O2 88-92%.  NTG, zofran given. Will repeat duoneb. Will speak again with triad about bed placement upgrade to stepdown. EKG unchanged.  11:54 Pt still having some CP, not feeling improved, will repeat istat trop, get ABG,CXR, give further SL NTG, Pt now has crackles at bases, and is not as alert as prior. Will start bipap for concern for developing acute heart failure.  [facility]ad at bedside, has consulted CCM and requests beginning NGT gtt for possible [place] after similar presentation was found on his chart review. Heparin to be given.  Pt only minimally improved w/  BIPAP and titration of NTG. pH 7.18, pCO2 62.3.  Pt intubated by CCM and taken to ICU. CXR w/ development of CHF.     Shanna Cisco, MD 06/16/14 [phone]

## 2014-06-16 NOTE — Progress Notes (Signed)
PCCM Progress Note  Long discussion with pt's daughter Grandville Silos(Terry Grubb) over the phone.  She informs me that in the past and even earlier on day of admit, pt expressed wishes that in the event that her heart were to stop, she does NOT want any CPR or electric shocks to be done. She is OK with pressors or antiarrhythmics as well as short term intubation (has been intubated in the past).  Code status updated to Limited Code Blue and RN informed.  Rutherford Guysahul Rosan Calbert, PA - [facility] Pgr: [phone]  or [phone]

## 2014-06-16 NOTE — ED Notes (Signed)
EDP at bedside, receiving breathing treatment at the time. Vital signs stable. Family at bedside. Will continue to monitor closely.

## 2014-06-16 NOTE — ED Notes (Signed)
CCM at bedside preparing to intubate patient.  

## 2014-06-16 NOTE — ED Notes (Signed)
Patient transported to X-ray 

## 2014-06-16 NOTE — ED Notes (Signed)
Admitting physician at bedside

## 2014-06-16 NOTE — Progress Notes (Signed)
ANTICOAGULATION CONSULT NOTE - Follow Up Consult  Pharmacy Consult for heparin  Indication: chest pain/ACS  Allergies  Allergen Reactions  . Metoprolol Swelling    Takes bisoprolol without issue  . Norvasc [Amlodipine Besylate] Swelling    Takes nisoldipine without issue  . Sulfonamide Derivatives Hives, Itching and Swelling  . Amiodarone Nausea And Vomiting  . Medrol [Methylprednisolone]     Fluid retention/CHF exacerbation - caution with steroids in future if ever needed  . Statins     Myalgias - "I won't take them"  . Coumadin [Warfarin Sodium] Rash  . Latex Itching    Patient Measurements: Weight: 128 lb 1.4 oz (58.1 kg) Heparin Dosing Weight:   Vital Signs: Temp: 99 F (37.2 C) (07/20 2100) Temp src: Core (Comment) (07/20 2000) BP: 132/55 mmHg (07/20 2100) Pulse Rate: 72 (07/20 2100)  Labs:  Recent Labs  06/16/14 0751 06/16/14 1454 06/16/14 2030  HGB 9.4*  --   --   HCT 28.7*  --   --   PLT 297  --   --   HEPARINUNFRC  --   --  0.39  CREATININE 2.15*  --   --   TROPONINI  --  3.80*  --     The CrCl is unknown because both a height and weight (above a minimum accepted value) are required for this calculation.   Medications:  Prescriptions prior to admission  Medication Sig Dispense Refill  . aspirin EC 81 MG tablet Take 1 tablet (81 mg total) by mouth daily.      Marland Kitchen b complex vitamins tablet Take 1 tablet by mouth daily.        . bisoprolol (ZEBETA) 5 MG tablet Take 5 mg by mouth daily.      . Cholecalciferol (VITAMIN D-3) 5000 UNITS TABS Take 5,000 Units by mouth daily.       . clopidogrel (PLAVIX) 75 MG tablet Take 1 tablet (75 mg total) by mouth daily with breakfast.  30 tablet  6  . fish oil-omega-3 fatty acids 1000 MG capsule Take 1 g by mouth 2 (two) times daily.       . furosemide (LASIX) 40 MG tablet Take 40 mg by mouth daily.      Marland Kitchen guaifenesin (ROBITUSSIN) 100 MG/5ML syrup Take 100 mg by mouth 3 (three) times daily as needed for cough.      .  hydrALAZINE (APRESOLINE) 25 MG tablet Take 1 tablet (25 mg total) by mouth 2 (two) times daily.  60 tablet  3  . isosorbide mononitrate (IMDUR) 60 MG 24 hr tablet Take 1 tablet (60 mg total) by mouth daily.  30 tablet  3  . loratadine (CLARITIN) 10 MG tablet Take 1 tablet (10 mg total) by mouth every other day as needed for allergies (Take in AM (on days when taking)).      . nitroGLYCERIN (NITROSTAT) 0.4 MG SL tablet Place 1 tablet (0.4 mg total) under the tongue every 5 (five) minutes x 3 doses as needed for chest pain.  25 tablet  4   Scheduled:  . antiseptic oral rinse  15 mL Mouth Rinse QID  . [START ON 06/17/2014] aspirin  81 mg Oral Daily  . B-complex with vitamin C  1 tablet Oral Daily  . chlorhexidine  15 mL Mouth Rinse BID  . etomidate      . fentaNYL      . hydrocortisone sod succinate (SOLU-CORTEF) inj  50 mg Intravenous Q6H  . lidocaine (cardiac) 100 mg/48ml      .  midazolam  4 mg Intravenous Once  . pantoprazole (PROTONIX) IV  40 mg Intravenous QHS  . piperacillin-tazobactam (ZOSYN)  IV  2.25 g Intravenous 4 times per day  . rocuronium      . sodium chloride  3 mL Intravenous Q12H  . sodium chloride  3 mL Intravenous Q12H  . succinylcholine      . [START ON 06/18/2014] vancomycin  750 mg Intravenous Q48H    Assessment: 80 yo who was started on IV heparin to r/o ACS. First level came back therapeutic. Will confirm with level in AM  Goal of Therapy:  Heparin level 0.3-0.7 units/ml Monitor platelets by anticoagulation protocol: Yes   Plan:   Cont heparin at 700 units/hr Confirm level in AM

## 2014-06-16 NOTE — ED Notes (Signed)
CCM at bedside attempting to establish central line.

## 2014-06-16 NOTE — ED Notes (Signed)
Rounding on patient and family at bedside.  Pt complaining of having trouble breathing.  Pt pale, clammy to touch and having labored respirations.  Pt complaining of burning in chest with tightness.  Notified Dr. Micheline Mazeocherty.  Notified primary RN Aundra MilletMegan and captured another EKG at bedside.  Respiratory to bedside administering neb treatment.

## 2014-06-16 NOTE — ED Notes (Signed)
Dentures sent to unit with patient.

## 2014-06-16 NOTE — Progress Notes (Addendum)
ANTIBIOTIC CONSULT NOTE - INITIAL  Pharmacy Consult for levaquin, vancomycin and zosyn Indication: pneumonia, UTI  Allergies  Allergen Reactions  . Metoprolol Swelling    Takes bisoprolol without issue  . Norvasc [Amlodipine Besylate] Swelling    Takes nisoldipine without issue  . Sulfonamide Derivatives Hives, Itching and Swelling  . Amiodarone Nausea And Vomiting  . Medrol [Methylprednisolone]     Fluid retention/CHF exacerbation - caution with steroids in future if ever needed  . Statins     Myalgias - "I won't take them"  . Coumadin [Warfarin Sodium] Rash  . Latex Itching    Patient Measurements:    Body Weight: 57.7 kg  Vital Signs: Temp: 97.8 F (36.6 C) (07/20 0718) Temp src: Oral (07/20 0718) BP: 113/61 mmHg (07/20 1031) Pulse Rate: 88 (07/20 1031) Intake/Output from previous day:   Intake/Output from this shift:    Labs:  Recent Labs  06/16/14 0751  WBC 7.8  HGB 9.4*  PLT 297  CREATININE 2.15*   The CrCl is unknown because both a height and weight (above a minimum accepted value) are required for this calculation. No results found for this basename: VANCOTROUGH, VANCOPEAK, VANCORANDOM, GENTTROUGH, GENTPEAK, GENTRANDOM, TOBRATROUGH, TOBRAPEAK, TOBRARND, AMIKACINPEAK, AMIKACINTROU, AMIKACIN,  in the last 72 hours   Microbiology: No results found for this or any previous visit (from the past 720 hour(s)).  Medical History: Past Medical History  Diagnosis Date  . Lower extremity edema     bilateral  . CKD (chronic kidney disease), stage IV     a. baseline creat previously 1.6->2 - elevated during 03/2013 adm.  . Labile hypertension   . Anxiety   . Failure to thrive in childhood   . History of shingles July 2004    on the left side of her thorax and postherpetic neuralgia  . History of psoriasis   . History of recurrent UTIs   . Major depressive disorder, single episode, severe 2004    "when my husband died"  . Valvular heart disease     a.   11/2012 Echo: Mild AS/AI/MR;  b. 02/2013 Echo: EF 55-60%, mildly dil LA, calcified AV/MV.  Marland Kitchen. CAD (coronary artery disease)     a. NSTEMI 05/2012 s/p DES to OM (residual severe LCx subbranch dz for med rx unless refractory angina recurs) b. NSTEMI 03/2013 felt 2/2 demand in setting of resp failure, for med rx.  Marland Kitchen. LBBB (left bundle branch block)     intermittent  . Chronic combined systolic and diastolic CHF (congestive heart failure)     a. 05/2012 Echo: EF 60-65%;  b. 07/2012 Echo in setting of afib: EF 35%;  c. 02/2013 Echo: EF 55-60%, mildly dil LA.  Marland Kitchen. PAF (paroxysmal atrial fibrillation)     a. Dx 07/2012 -> Amio started but later dc'd 2/2 nausea/NSR. b. 02/2013 Coumadin initiated, dc'd 03/2013 2/2 allergic reaction. c. Ultimately not on NOAC due to age, Coumadin allergy and low CrCl.  Marland Kitchen. Respiratory failure     a. Requiring intubation 03/2013, secondary to cardiogenic pulmonary edema.    Medications:  See med history Assessment: 80 yo lady to start levaquin, vanc and zosyn for r/o PNA and UTI.  Her CrCl ~ 20 ml/min with SrCr 2.15.     Goal of Therapy:  Eradication of infection Vanc trough 15-20 mg/L  Plan:  Levaquin 750 mg IV X 1 then 500 mg IV q48 hours Zosyn 2.25 gm IV q6 hours Vancomycin 1gm IV now then 750 mg IV q48 hours  Will f/u clinical course, cultures and renal function.  Annita Ratliff Poteet 06/16/2014,10:32 AM  Addum:  Start heparin for ACS.  Will bolus with 2500 units and drip at 700 units/hr.  Check heparin level 8 hours after start.  Goal HL 0.3-0.7 units/ml  Daily HL and CBC while on heparin.

## 2014-06-16 NOTE — Progress Notes (Signed)
eLink Physician-Brief Progress Note Patient Name: Deanna KnackCecelia W Herrera DOB: 05/18/1930 MRN: [id number]  Date of Service  06/16/2014   HPI/Events of Note   Troponin 3.0  eICU Interventions  Call cards consult   Intervention Category Intermediate Interventions: Diagnostic test evaluation  Shan Levansatrick Wright 06/16/2014, 5:59 PM

## 2014-06-16 NOTE — Progress Notes (Signed)
Canary BrimBrandi Ollis, NP notified of patients heart rate change. No new orders placed at this time.   Ree Edmanrystal Manus, Charity fundraiserN.

## 2014-06-16 NOTE — Consult Note (Signed)
CONSULT NOTE  Date: 06/16/2014               Patient Name:  [name] MRN: [id number]  DOB: 10/15/1930 Age / Sex: 80 y.o., female        PCP: [name] Primary Cardiologist: Excell Seltzer            Referring Physician: Molli Knock              Reason for Consult: + troponin           History of Present Illness: Patient is a 80 y.o. female with a PMHx of HTN, CKD , stage IV,  CAD ( NSTEMI in 03/2013 due to demand ischemi in setting of respiratory failure) ,  Chronic combined systolic and diastolic CHF, PAF who was admitted to [facility] on 06/16/2014 for evaluation of respiratory failure..   She has been having increasing angina.  Her Imdur has been increased.  This am she was having severe angina pain and presented to the ED with burning in her chest . She has known CAD but given her CKD, stage IV, we have been hesitant to do cath because of risk of renal failure and need for dialysis.   She presented to the ED with dyspnea,  She had respiratory failure and was started on BIPAP adn was later intubated.    She is thought to have ARDS due to UTI   Medications: Outpatient medications: Prescriptions prior to admission  Medication Sig Dispense Refill  . aspirin EC 81 MG tablet Take 1 tablet (81 mg total) by mouth daily.      Marland Kitchen b complex vitamins tablet Take 1 tablet by mouth daily.        . bisoprolol (ZEBETA) 5 MG tablet Take 5 mg by mouth daily.      . Cholecalciferol (VITAMIN D-3) 5000 UNITS TABS Take 5,000 Units by mouth daily.       . clopidogrel (PLAVIX) 75 MG tablet Take 1 tablet (75 mg total) by mouth daily with breakfast.  30 tablet  6  . fish oil-omega-3 fatty acids 1000 MG capsule Take 1 g by mouth 2 (two) times daily.       . furosemide (LASIX) 40 MG tablet Take 40 mg by mouth daily.      Marland Kitchen guaifenesin (ROBITUSSIN) 100 MG/5ML syrup Take 100 mg by mouth 3 (three) times daily as needed for cough.      . hydrALAZINE (APRESOLINE) 25 MG tablet Take 1 tablet (25 mg total)  by mouth 2 (two) times daily.  60 tablet  3  . isosorbide mononitrate (IMDUR) 60 MG 24 hr tablet Take 1 tablet (60 mg total) by mouth daily.  30 tablet  3  . loratadine (CLARITIN) 10 MG tablet Take 1 tablet (10 mg total) by mouth every other day as needed for allergies (Take in AM (on days when taking)).      . nitroGLYCERIN (NITROSTAT) 0.4 MG SL tablet Place 1 tablet (0.4 mg total) under the tongue every 5 (five) minutes x 3 doses as needed for chest pain.  25 tablet  4    Current medications: Current Facility-Administered Medications  Medication Dose Route Frequency Provider Last Rate Last Dose  . 0.9 %  sodium chloride infusion  250 mL Intravenous PRN Stephani Police, PA-C      . acetaminophen (TYLENOL) tablet 650 mg  650 mg Oral Q6H PRN Stephani Police, PA-C       Or  .  acetaminophen (TYLENOL) suppository 650 mg  650 mg Rectal Q6H PRN Stephani Police, PA-C      . albuterol (PROVENTIL) (2.5 MG/3ML) 0.083% nebulizer solution 2.5 mg  2.5 mg Nebulization Q3H PRN Jeanella Craze, NP      . alum & mag hydroxide-simeth (MAALOX/MYLANTA) 200-200-20 MG/5ML suspension 30 mL  30 mL Oral Q6H PRN Stephani Police, PA-C      . antiseptic oral rinse (BIOTENE) solution 15 mL  15 mL Mouth Rinse QID Nelda Bucks, MD   15 mL at 06/16/14 1600  . [START ON 06/17/2014] aspirin chewable tablet 81 mg  81 mg Oral Daily Jeanella Craze, NP      . B-complex with vitamin C tablet 1 tablet  1 tablet Oral Daily Stephani Police, PA-C   1 tablet at 06/16/14 1652  . chlorhexidine (PERIDEX) 0.12 % solution 15 mL  15 mL Mouth Rinse BID Nelda Bucks, MD      . etomidate (AMIDATE) 2 MG/ML injection           . etomidate (AMIDATE) injection   Intravenous PRN Alyson Reedy, MD   10 mg at 06/16/14 1240  . fentaNYL (SUBLIMAZE) 0.05 MG/ML injection           . fentaNYL (SUBLIMAZE) injection 50 mcg  50 mcg Intravenous Q2H PRN Storm Frisk, MD   50 mcg at 06/16/14 1813  . gi cocktail (Maalox,Lidocaine,Donnatal)  30 mL  Oral BID PRN Stephani Police, PA-C      . guaifenesin (ROBITUSSIN) 100 MG/5ML syrup 100 mg  100 mg Oral TID PRN Stephani Police, PA-C      . heparin ADULT infusion 100 units/mL (25000 units/250 mL)  700 Units/hr Intravenous Continuous Shanna Cisco, MD 7 mL/hr at 06/16/14 1512 700 Units/hr at 06/16/14 1512  . hydrocortisone sodium succinate (SOLU-CORTEF) 100 MG injection 50 mg  50 mg Intravenous Q6H Jeanella Craze, NP   50 mg at 06/16/14 1813  . lidocaine (cardiac) 100 mg/77ml (XYLOCAINE) 20 MG/ML injection 2%           . midazolam (VERSED) injection 4 mg  4 mg Intravenous Once Alyson Reedy, MD      . nitroGLYCERIN (NITROSTAT) SL tablet 0.4 mg  0.4 mg Sublingual Q5 min PRN Shanna Cisco, MD   0.4 mg at 06/16/14 1154  . norepinephrine (LEVOPHED) 4 mg in dextrose 5 % 250 mL infusion  2-50 mcg/min Intravenous Titrated Shanna Cisco, MD   10 mcg/min at 06/16/14 1230  . ondansetron (ZOFRAN) tablet 4 mg  4 mg Oral Q6H PRN Stephani Police, PA-C       Or  . ondansetron (ZOFRAN) injection 4 mg  4 mg Intravenous Q6H PRN Stephani Police, PA-C      . pantoprazole (PROTONIX) injection 40 mg  40 mg Intravenous QHS Jeanella Craze, NP      . piperacillin-tazobactam (ZOSYN) IVPB 2.25 g  2.25 g Intravenous 4 times per day Alyson Reedy, MD   2.25 g at 06/16/14 1813  . rocuronium (ZEMURON) 50 MG/5ML injection           . rocuronium (ZEMURON) injection   Intravenous PRN Alyson Reedy, MD   30 mg at 06/16/14 1240  . sodium chloride 0.9 % injection 3 mL  3 mL Intravenous Q12H Marianne L York, PA-C   3 mL at 06/16/14 1500  . sodium chloride 0.9 % injection 3 mL  3  mL Intravenous Q12H Tora Kindred York, PA-C   3 mL at 06/16/14 1500  . sodium chloride 0.9 % injection 3 mL  3 mL Intravenous PRN Stephani Police, PA-C      . succinylcholine (ANECTINE) 20 MG/ML injection           . [START ON 06/18/2014] vancomycin (VANCOCIN) IVPB 750 mg/150 ml premix  750 mg Intravenous Q48H Alyson Reedy, MD         Allergies    Allergen Reactions  . Metoprolol Swelling    Takes bisoprolol without issue  . Norvasc [Amlodipine Besylate] Swelling    Takes nisoldipine without issue  . Sulfonamide Derivatives Hives, Itching and Swelling  . Amiodarone Nausea And Vomiting  . Medrol [Methylprednisolone]     Fluid retention/CHF exacerbation - caution with steroids in future if ever needed  . Statins     Myalgias - "I won't take them"  . Coumadin [Warfarin Sodium] Rash  . Latex Itching     Past Medical History  Diagnosis Date  . Lower extremity edema     bilateral  . CKD (chronic kidney disease), stage IV     a. baseline creat previously 1.6->2 - elevated during Apr 11, 2013 adm.  . Labile hypertension   . Anxiety   . Failure to thrive in childhood   . History of shingles July 2004    on the left side of her thorax and postherpetic neuralgia  . History of psoriasis   . History of recurrent UTIs   . Major depressive disorder, single episode, severe 04/12/2003    "when my husband died"  . Valvular heart disease     a.  11/2012 Echo: Mild AS/AI/MR;  b. 02/2013 Echo: EF 55-60%, mildly dil LA, calcified AV/MV.  Marland Kitchen CAD (coronary artery disease)     a. NSTEMI 05/2012 s/p DES to OM (residual severe LCx subbranch dz for med rx unless refractory angina recurs) b. NSTEMI Apr 11, 2013 felt 2/2 demand in setting of resp failure, for med rx.  Marland Kitchen LBBB (left bundle branch block)     intermittent  . Chronic combined systolic and diastolic CHF (congestive heart failure)     a. 05/2012 Echo: EF 60-65%;  b. 07/2012 Echo in setting of afib: EF 35%;  c. 02/2013 Echo: EF 55-60%, mildly dil LA.  Marland Kitchen PAF (paroxysmal atrial fibrillation)     a. Dx 07/2012 -> Amio started but later dc'd 2/2 nausea/NSR. b. 02/2013 Coumadin initiated, dc'd Apr 11, 2013 2/2 allergic reaction. c. Ultimately not on NOAC due to age, Coumadin allergy and low CrCl.  Marland Kitchen Respiratory failure     a. Requiring intubation 04-11-2013, secondary to cardiogenic pulmonary edema.    Past Surgical History   Procedure Laterality Date  . Salpingoophorectomy      "cyst removed"  . Appendectomy    . Breast cyst excision    . Abdominal hysterectomy    . Cataract extraction, bilateral  ~ 04-11-09  . Cardiac surgery      Family History  Problem Relation Age of Onset  . Diabetes    . Cancer    . Lung cancer      Social History:  reports that she quit smoking about 34 years ago. Her smoking use included Cigarettes. She has a 45 pack-year smoking history. She has never used smokeless tobacco. She reports that she drinks alcohol. She reports that she does not use illicit drugs.   Review of Systems: Not obtainable due to intubation and ventilation  Physical Exam: BP  139/52  Pulse 81  Temp(Src) 97.6 F (36.4 C) (Oral)  Resp 22  SpO2 100%  Wt Readings from Last 3 Encounters:  05/27/14 127 lb 1.9 oz (57.661 kg)  04/29/14 127 lb 1.9 oz (57.661 kg)  04/17/14 127 lb 6.4 oz (57.788 kg)    General: Vital signs reviewed and noted. Well-developed, well-nourished, in no acute distress; alert,   Head: Normocephalic, atraumatic, sclera anicteric, , intubated   Neck: Supple. Negative for carotid bruits. No JVD   Lungs:  Clear bilaterally, no  wheezes, rales, or rhonchi. Breathing is normal   Heart: RRR with S1 S2. No murmurs, rubs, or gallops   Abdomen:  Soft, non-tender, non-distended with normoactive bowel sounds. No hepatomegaly. No rebound/guarding. No obvious abdominal masses   MSK: Strength and the appear normal for age.   Extremities: No clubbing or cyanosis. No edema.  Distal pedal pulses are 2+ and equal   Neurologic: Sedated , on vent  Psych: Sedated, on vent      Lab results: Basic Metabolic Panel:  Recent Labs Lab 06/16/14 0751  NA 137  K 3.9  CL 97  CO2 24  GLUCOSE 113*  BUN 40*  CREATININE 2.15*  CALCIUM 8.7    Liver Function Tests:  Recent Labs Lab 06/16/14 0751  AST 19  ALT 10  ALKPHOS 66  BILITOT 0.2*  PROT 7.1  ALBUMIN 3.4*   No results found for this  basename: LIPASE, AMYLASE,  in the last 168 hours No results found for this basename: AMMONIA,  in the last 168 hours  CBC:  Recent Labs Lab 06/16/14 0751  WBC 7.8  NEUTROABS 6.3  HGB 9.4*  HCT 28.7*  MCV 90.3  PLT 297    Cardiac Enzymes:  Recent Labs Lab 06/16/14 1454  TROPONINI 3.80*    BNP: No components found with this basename: POCBNP,   CBG:  Recent Labs Lab 06/16/14 1456 06/16/14 1535  GLUCAP 166* 161*    Coagulation Studies: No results found for this basename: LABPROT, INR,  in the last 72 hours   Other results:  EKG :  NSR , LBBB  Imaging: Dg Chest 2 View  06/16/2014   CLINICAL DATA:  Cough, nausea  EXAM: CHEST  2 VIEW  COMPARISON:  04/10/2013  FINDINGS: The aorta is unfolded and ectatic. Mild moderate enlargement of the cardiac silhouette is noted. Central vascular congestion is identified with peripheral Kerley B-lines compatible with interstitial edema. Trace effusions are noted. Hyperinflation suggests emphysema. Atheromatous aortic calcification noted without calcified aneurysm.  IMPRESSION: Cardiomegaly with mild interstitial pulmonary edema and trace pleural effusions.   Electronically Signed   By: Christiana Pellant M.D.   On: 06/16/2014 08:27   Dg Chest Port 1 View  06/16/2014   CLINICAL DATA:  EvaluateRUTLC  EXAM: PORTABLE CHEST - 1 VIEW  COMPARISON:  Chest radiograph 06/16/2014  FINDINGS: Interval intubation with endotracheal tube terminating within the mid trachea. Enteric tube courses inferior to the diaphragm, tip not included on this examination. Right IJ central venous catheter tip projects at the superior cavoatrial junction.  Stable cardiac and mediastinal contours. Slight interval improvement in bilateral interstitial pulmonary opacities. Persistent retrocardiac consolidative opacity. No definite pleural effusion or pneumothorax.  IMPRESSION: ET tube terminates in the mid trachea.  Enteric tube courses inferior to the diaphragm, tip not  included on this examination.  Right IJ central venous catheter tip projects at the superior cavoatrial junction. No definite right-sided pneumothorax.  Persistent consolidation in the retrocardiac region may represent  atelectasis or infection.  Slight improvement parahilar interstitial opacities, potentially improving pulmonary edema.   Electronically Signed   By: Annia Beltrew  Davis M.D.   On: 06/16/2014 13:49   Dg Chest Portable 1 View  06/16/2014   CLINICAL DATA:  Chest pain, shortness of breath.  EXAM: PORTABLE CHEST - 1 VIEW  COMPARISON:  Chest radiograph 06/16/2014.  FINDINGS: Stable cardiac and mediastinal contours. Interval increase in bilateral interstitial pulmonary opacities. Retrocardiac consolidation with obscuration of the left hemidiaphragm. No definite pleural effusion or pneumothorax.  IMPRESSION: Interval development of bilateral interstitial pulmonary opacities, suggestive of congestive heart failure.  Retrocardiac consolidative opacity favored to represent atelectasis. Infection not excluded.   Electronically Signed   By: Annia Beltrew  Davis M.D.   On: 06/16/2014 12:33   Dg Abd Portable 1v  06/16/2014   CLINICAL DATA:  OG tube placement.  EXAM: PORTABLE ABDOMEN - 1 VIEW  COMPARISON:  CT abdomen and pelvis 10/11/2003.  FINDINGS: OG tube is in place with the tip in the body of the stomach. Left basilar airspace disease is noted.  IMPRESSION: NG tube tip is in the stomach.  Left basilar airspace disease.   Electronically Signed   By: Drusilla Kannerhomas  Dalessio M.D.   On: 06/16/2014 15:58       Assessment & Plan:  1. CAD : she has been having more angina - Increasing imdur has helped but has not relived her pain.  She is now here with CP and + troponin levels c./w NSTEMI.    She needs to be considered for cath although the risk of renal failure is high.  Continue supportive care.   Continue to draw Troponin levels.  Echo tomorrow to assess LV function.  Will consider cath later in the week if she is  better.    Dr. Excell Seltzerooper is her primary cardiologist.    2. Respiratory failure.  She has chronic combined systolic and diastolic chf.  This may have been worsened by cardiac ischemia.   3. ? ARDS:  Plans per PCCM    Alvia GrovePhilip J. Azeneth Carbonell, Jr., MD, Community Memorial HospitalFACC 06/16/2014, 6:23 PM Office - [phone] Pager [phone]

## 2014-06-16 NOTE — Progress Notes (Signed)
CRITICAL VALUE ALERT  Critical value received: Troponin 3.80  Date of notification:  007/20/15  Time of notification:  1753  Critical value read back:Yes.    Nurse who received alert:  Ree Edmanrystal Manus, RN  MD notified (1st page):  Dr. Delford FieldWright   Time of first page:  1754  MD notified (2nd page):  Time of second page:  Responding MD:  Dr. Delford FieldWright  Time MD responded:  [phone]

## 2014-06-16 NOTE — ED Notes (Signed)
[facility] for evaluation of cough and nausea. Ongoing for several days. Pt states cardiac history with stent placement. She is alert and oriented x4. Respirations unlabored. 18g LAC. CBG 124.

## 2014-06-16 NOTE — H&P (Addendum)
PULMONARY / CRITICAL CARE MEDICINE   Name: Deanna KnackCecelia W Herrera MRN: [id number] DOB: 12/26/1929    ADMISSION DATE:  06/16/2014 CONSULTATION DATE:  06/16/2014  REFERRING MD :  EDP  CHIEF COMPLAINT:  Respiratory failure and hypotension  INITIAL PRESENTATION: 80 year old female with extensive cardiac history who presents to the hospital with the chief complaint of SOB.  While in the ED, became hypotensive, given fluid, developed acute respiratory failure due to pulmonary edema.  Patient was started on BiPAP and PCCM was called to admit.   SIGNIFICANT EVENTS: 7/20 Admit with chest pain, acute respiratory failure, UTI.  Access  ETT 7/20>>> R IJ TLC 7/20>>> Foley 7/20>>> OGT 7/20>>>  Abx Vancomycin 7/20>>> Zosyn 7/20>>>  Cultures Blood 7/20>>> Urine 7/20>>> Sputum 7/20>>>  PAST MEDICAL HISTORY :  Past Medical History  Diagnosis Date  . Lower extremity edema     bilateral  . CKD (chronic kidney disease), stage IV     a. baseline creat previously 1.6->2 - elevated during 03/2013 adm.  . Labile hypertension   . Anxiety   . Failure to thrive in childhood   . History of shingles July 2004    on the left side of her thorax and postherpetic neuralgia  . History of psoriasis   . History of recurrent UTIs   . Major depressive disorder, single episode, severe 2004    "when my husband died"  . Valvular heart disease     a.  11/2012 Echo: Mild AS/AI/MR;  b. 02/2013 Echo: EF 55-60%, mildly dil LA, calcified AV/MV.  Marland Kitchen. CAD (coronary artery disease)     a. NSTEMI 05/2012 s/p DES to OM (residual severe LCx subbranch dz for med rx unless refractory angina recurs) b. NSTEMI 03/2013 felt 2/2 demand in setting of resp failure, for med rx.  Marland Kitchen. LBBB (left bundle branch block)     intermittent  . Chronic combined systolic and diastolic CHF (congestive heart failure)     a. 05/2012 Echo: EF 60-65%;  b. 07/2012 Echo in setting of afib: EF 35%;  c. 02/2013 Echo: EF 55-60%, mildly dil LA.  Marland Kitchen. PAF (paroxysmal  atrial fibrillation)     a. Dx 07/2012 -> Amio started but later dc'd 2/2 nausea/NSR. b. 02/2013 Coumadin initiated, dc'd 03/2013 2/2 allergic reaction. c. Ultimately not on NOAC due to age, Coumadin allergy and low CrCl.  Marland Kitchen. Respiratory failure     a. Requiring intubation 03/2013, secondary to cardiogenic pulmonary edema.   Past Surgical History  Procedure Laterality Date  . Salpingoophorectomy      "cyst removed"  . Appendectomy    . Breast cyst excision    . Abdominal hysterectomy    . Cataract extraction, bilateral  ~ 2010  . Cardiac surgery     Prior to Admission medications   Medication Sig Start Date End Date Taking? Authorizing Provider  aspirin EC 81 MG tablet Take 1 tablet (81 mg total) by mouth daily. 06/08/12  Yes Dayna N Dunn, PA-C  b complex vitamins tablet Take 1 tablet by mouth daily.     Yes Historical Provider, MD  bisoprolol (ZEBETA) 5 MG tablet Take 5 mg by mouth daily.   Yes Historical Provider, MD  Cholecalciferol (VITAMIN D-3) 5000 UNITS TABS Take 5,000 Units by mouth daily.    Yes Historical Provider, MD  clopidogrel (PLAVIX) 75 MG tablet Take 1 tablet (75 mg total) by mouth daily with breakfast. 04/11/13  Yes Dayna N Dunn, PA-C  fish oil-omega-3 fatty acids 1000 MG  capsule Take 1 g by mouth 2 (two) times daily.    Yes Historical Provider, MD  furosemide (LASIX) 40 MG tablet Take 40 mg by mouth daily.   Yes Historical Provider, MD  guaifenesin (ROBITUSSIN) 100 MG/5ML syrup Take 100 mg by mouth 3 (three) times daily as needed for cough.   Yes Historical Provider, MD  hydrALAZINE (APRESOLINE) 25 MG tablet Take 1 tablet (25 mg total) by mouth 2 (two) times daily. 04/17/14  Yes Rosalio Macadamia, NP  isosorbide mononitrate (IMDUR) 60 MG 24 hr tablet Take 1 tablet (60 mg total) by mouth daily. 04/29/14  Yes Tonny Bollman, MD  loratadine (CLARITIN) 10 MG tablet Take 1 tablet (10 mg total) by mouth every other day as needed for allergies (Take in AM (on days when taking)). 04/29/13   Yes Pricilla Riffle, MD  nitroGLYCERIN (NITROSTAT) 0.4 MG SL tablet Place 1 tablet (0.4 mg total) under the tongue every 5 (five) minutes x 3 doses as needed for chest pain. 02/07/14 10/09/15 Yes [name], MD   Allergies  Allergen Reactions  . Metoprolol Swelling    Takes bisoprolol without issue  . Norvasc [Amlodipine Besylate] Swelling    Takes nisoldipine without issue  . Sulfonamide Derivatives Hives, Itching and Swelling  . Amiodarone Nausea And Vomiting  . Medrol [Methylprednisolone]     Fluid retention/CHF exacerbation - caution with steroids in future if ever needed  . Statins     Myalgias - "I won't take them"  . Coumadin [Warfarin Sodium] Rash  . Latex Itching    FAMILY HISTORY:  Family History  Problem Relation Age of Onset  . Diabetes    . Cancer    . Lung cancer     SOCIAL HISTORY:  reports that she quit smoking about 34 years ago. Her smoking use included Cigarettes. She has a 45 pack-year smoking history. She has never used smokeless tobacco. She reports that she drinks alcohol. She reports that she does not use illicit drugs.  REVIEW OF SYSTEMS:  Unresponsive  SUBJECTIVE:   VITAL SIGNS: Temp:  [97.8 F (36.6 C)] 97.8 F (36.6 C) (07/20 0718) Pulse Rate:  [80-97] 93 (07/20 1200) Resp:  [18-28] 28 (07/20 1200) BP: (100-130)/(61-74) 110/68 mmHg (07/20 1200) SpO2:  [92 %-98 %] 98 % (07/20 1216) FiO2 (%):  [60 %] 60 % (07/20 1216) HEMODYNAMICS:   VENTILATOR SETTINGS: Vent Mode:  [-]  FiO2 (%):  [60 %] 60 % INTAKE / OUTPUT: Intake/Output     07/19 0701 - 07/20 0700 07/20 0701 - 07/21 0700   I.V.  1000   Total Intake   1000   Net   +1000          PHYSICAL EXAMINATION: General:  Chronically ill appearing female, unresponsive on BiPAP. Neuro:  Unresponsive, withdraws all ext to pain however. HEENT:  [place]/AT, PERRL, EOM-spontaneous Cardiovascular:  RRR, Nl S1/S2, -M/R/G. Lungs:  Diffuse crackles. Abdomen:  Soft, NT, ND and +BS. Musculoskeletal:   -edema and -tenderness Skin:  Intact.  LABS:  CBC  Recent Labs Lab 06/16/14 0751  WBC 7.8  HGB 9.4*  HCT 28.7*  PLT 297   Coag's No results found for this basename: APTT, INR,  in the last 168 hours BMET  Recent Labs Lab 06/16/14 0751  NA 137  K 3.9  CL 97  CO2 24  BUN 40*  CREATININE 2.15*  GLUCOSE 113*   Electrolytes  Recent Labs Lab 06/16/14 0751  CALCIUM 8.7   Sepsis  Markers No results found for this basename: LATICACIDVEN, PROCALCITON, O2SATVEN,  in the last 168 hours ABG  Recent Labs Lab 06/16/14 1217  PHART 7.182*  PCO2ART 62.3*  PO2ART 115.0*   Liver Enzymes  Recent Labs Lab 06/16/14 0751  AST 19  ALT 10  ALKPHOS 66  BILITOT 0.2*  ALBUMIN 3.4*   Cardiac Enzymes  Recent Labs Lab 06/16/14 0751  PROBNP 4799.0*   Glucose No results found for this basename: GLUCAP,  in the last 168 hours  Imaging Dg Chest 2 View  06/16/2014   CLINICAL DATA:  Cough, nausea  EXAM: CHEST  2 VIEW  COMPARISON:  04/10/2013  FINDINGS: The aorta is unfolded and ectatic. Mild moderate enlargement of the cardiac silhouette is noted. Central vascular congestion is identified with peripheral Kerley B-lines compatible with interstitial edema. Trace effusions are noted. Hyperinflation suggests emphysema. Atheromatous aortic calcification noted without calcified aneurysm.  IMPRESSION: Cardiomegaly with mild interstitial pulmonary edema and trace pleural effusions.   Electronically Signed   By: Christiana Pellant M.D.   On: 06/16/2014 08:27     CXR: Diffuse pulmonary edema.  ASSESSMENT / PLAN:  PULMONARY A: Acute respiratory failure due to acute pulmonary edema, ?ARDS with sepsis as well from UTI. P:   - Now intubation. - Full support, 8 cc/kg - F/U CXR and ABG.  CARDIOVASCULAR A:  Septic shock Chest Pain  Elevated Troponin - likely demand ischemia, r/o ACS Combined CHF PAF - not on anti-coagulation due to allergy to coumadin Hx Labile HTN Hx LBBB,  Valvular Heart Disease P:  - CVP. - TLC placement - Levophed for MAP >65 - No IVF given pulmonary edema. - No Diureses given hypotension. - Heparin gtt for now, low threshold to d/c pending next troponin - If troponin with significant rise, consider Cardiology consult - Repeat EKG in am  - Trend troponin  - Assess cortisol, stress steroids (this has contributed to CHF exacerbations in past, use with caution)  RENAL A:   CKD - baseline sr cr approx 2.5 P:   - No hydration. - Hold lasix. - BMET in AM. - Replace electrolytes as indicated.  GASTROINTESTINAL A:  No active issues P:   - OGT. - Nutrition for TF.  HEMATOLOGIC A:  Leukocytosis P:  - CBC  INFECTIOUS A:   UTI R/O PNA. P:   - Vanc/zosyn. - F/U on cultures. - Likely can narrow abx to CAP / community coverage in am 7/21  ENDOCRINE A:   No diabetes history. R/O AI  P:   - Assess cortisol, tress dose steroids. - Cortisol level. - May require SSI   NEUROLOGIC A:   Acute Encephalopathy - secondary to hypercarbia P: - No need for head CT, was functional upon arrival. - PAD 1.  TODAY'S SUMMARY: Daughter wishes for full code status.  I have personally obtained a history, examined the patient, evaluated laboratory and imaging results, formulated the assessment and plan and placed orders.  CRITICAL CARE: The patient is critically ill with multiple organ systems failure and requires high complexity decision making for assessment and support, frequent evaluation and titration of therapies, application of advanced monitoring technologies and extensive interpretation of multiple databases. Critical Care Time devoted to patient care services described in this note is 45 minutes.   Alyson Reedy, M.D. [facility] Pulmonary/Critical Care Medicine. Pager: [phone]. After hours pager: [phone].  06/16/2014, 12:27 PM

## 2014-06-17 ENCOUNTER — Inpatient Hospital Stay (HOSPITAL_COMMUNITY): Payer: Medicare Other

## 2014-06-17 DIAGNOSIS — I5021 Acute systolic (congestive) heart failure: Secondary | ICD-10-CM

## 2014-06-17 DIAGNOSIS — I369 Nonrheumatic tricuspid valve disorder, unspecified: Secondary | ICD-10-CM

## 2014-06-17 DIAGNOSIS — I214 Non-ST elevation (NSTEMI) myocardial infarction: Secondary | ICD-10-CM

## 2014-06-17 DIAGNOSIS — N184 Chronic kidney disease, stage 4 (severe): Secondary | ICD-10-CM

## 2014-06-17 DIAGNOSIS — I2510 Atherosclerotic heart disease of native coronary artery without angina pectoris: Secondary | ICD-10-CM

## 2014-06-17 LAB — BLOOD GAS, ARTERIAL
Acid-base deficit: 3.1 mmol/L — ABNORMAL HIGH (ref 0.0–2.0)
BICARBONATE: 21.1 meq/L (ref 20.0–24.0)
Drawn by: 39899
FIO2: 0.4 %
MECHVT: 450 mL
O2 SAT: 100 %
PCO2 ART: 36.2 mmHg (ref 35.0–45.0)
PEEP: 5 cmH2O
PO2 ART: 157 mmHg — AB (ref 80.0–100.0)
Patient temperature: 98.6
RATE: 18 resp/min
TCO2: 22.2 mmol/L (ref 0–100)
pH, Arterial: 7.384 (ref 7.350–7.450)

## 2014-06-17 LAB — GLUCOSE, CAPILLARY
GLUCOSE-CAPILLARY: 85 mg/dL (ref 70–99)
Glucose-Capillary: 105 mg/dL — ABNORMAL HIGH (ref 70–99)

## 2014-06-17 LAB — BASIC METABOLIC PANEL
ANION GAP: 13 (ref 5–15)
BUN: 40 mg/dL — ABNORMAL HIGH (ref 6–23)
CO2: 22 mEq/L (ref 19–32)
Calcium: 8.3 mg/dL — ABNORMAL LOW (ref 8.4–10.5)
Chloride: 99 mEq/L (ref 96–112)
Creatinine, Ser: 2.37 mg/dL — ABNORMAL HIGH (ref 0.50–1.10)
GFR calc Af Amer: 21 mL/min — ABNORMAL LOW (ref >90)
GFR, EST NON AFRICAN AMERICAN: 18 mL/min — AB (ref >90)
Glucose, Bld: 112 mg/dL — ABNORMAL HIGH (ref 70–99)
Potassium: 4.2 mEq/L (ref 3.7–5.3)
SODIUM: 134 meq/L — AB (ref 137–147)

## 2014-06-17 LAB — MAGNESIUM
MAGNESIUM: 2 mg/dL (ref 1.5–2.5)
Magnesium: 2 mg/dL (ref 1.5–2.5)

## 2014-06-17 LAB — CBC
HCT: 27.3 % — ABNORMAL LOW (ref 36.0–46.0)
HEMOGLOBIN: 9 g/dL — AB (ref 12.0–15.0)
MCH: 29.9 pg (ref 26.0–34.0)
MCHC: 33 g/dL (ref 30.0–36.0)
MCV: 90.7 fL (ref 78.0–100.0)
PLATELETS: 287 10*3/uL (ref 150–400)
RBC: 3.01 MIL/uL — ABNORMAL LOW (ref 3.87–5.11)
RDW: 12.5 % (ref 11.5–15.5)
WBC: 12.3 10*3/uL — ABNORMAL HIGH (ref 4.0–10.5)

## 2014-06-17 LAB — PHOSPHORUS
PHOSPHORUS: 3.6 mg/dL (ref 2.3–4.6)
PHOSPHORUS: 4.1 mg/dL (ref 2.3–4.6)

## 2014-06-17 LAB — TROPONIN I: TROPONIN I: 10.38 ng/mL — AB (ref <0.30)

## 2014-06-17 LAB — CORTISOL: CORTISOL PLASMA: 41.2 ug/dL

## 2014-06-17 LAB — PROCALCITONIN: PROCALCITONIN: 0.32 ng/mL

## 2014-06-17 LAB — HEPARIN LEVEL (UNFRACTIONATED): Heparin Unfractionated: 0.34 IU/mL (ref 0.30–0.70)

## 2014-06-17 MED ORDER — AZITHROMYCIN 500 MG IV SOLR
500.0000 mg | INTRAVENOUS | Status: DC
  Administered 2014-06-17 – 2014-06-18 (×2): 500 mg via INTRAVENOUS
  Filled 2014-06-17 (×3): qty 500

## 2014-06-17 MED ORDER — VITAL AF 1.2 CAL PO LIQD
1000.0000 mL | ORAL | Status: DC
  Administered 2014-06-17 (×2): 1000 mL
  Filled 2014-06-17 (×3): qty 1000

## 2014-06-17 MED ORDER — VITAL HIGH PROTEIN PO LIQD
1000.0000 mL | ORAL | Status: DC
  Filled 2014-06-17 (×2): qty 1000

## 2014-06-17 MED ORDER — PRO-STAT SUGAR FREE PO LIQD
30.0000 mL | Freq: Two times a day (BID) | ORAL | Status: AC
  Administered 2014-06-17: 30 mL
  Filled 2014-06-17: qty 30

## 2014-06-17 MED ORDER — FUROSEMIDE 10 MG/ML IJ SOLN
40.0000 mg | Freq: Two times a day (BID) | INTRAMUSCULAR | Status: DC
  Administered 2014-06-17 – 2014-06-18 (×2): 40 mg via INTRAVENOUS
  Filled 2014-06-17 (×4): qty 4

## 2014-06-17 MED ORDER — METOPROLOL TARTRATE 1 MG/ML IV SOLN
5.0000 mg | Freq: Four times a day (QID) | INTRAVENOUS | Status: DC
  Filled 2014-06-17: qty 5

## 2014-06-17 MED ORDER — PRO-STAT SUGAR FREE PO LIQD
30.0000 mL | Freq: Two times a day (BID) | ORAL | Status: DC
Start: 2014-06-17 — End: 2014-06-17
  Administered 2014-06-17: 30 mL
  Filled 2014-06-17 (×2): qty 30

## 2014-06-17 NOTE — Progress Notes (Signed)
INITIAL NUTRITION ASSESSMENT  DOCUMENTATION CODES Per approved criteria  -Not Applicable   INTERVENTION:  Utilize 22M PEPuP Protocol: initiate TF via OGT with Vital AF 1.2 at 25 ml/h and Prostat 30 ml BID on day 1; on day 2, increase to goal rate of 45 ml/h (1080 ml per day) without Prostat to provide 1296 kcals, 81 gm protein, 876 ml free water daily.  NUTRITION DIAGNOSIS: Inadequate oral intake related to inability to eat as evidenced by NPO status.   Goal: Intake to meet >90% of estimated nutrition needs.  Monitor:  TF tolerance/adequacy, weight trend, labs, vent status.  Reason for Assessment: MD Consult for TF initiation and management.   80 y.o. female  Admitting Dx: Acute respiratory failure; hypotension  ASSESSMENT: 80 year old female with extensive cardiac history who presents to the hospital with the chief complaint of SOB. While in the ED, became hypotensive, given fluid, developed acute respiratory failure due to pulmonary edema. Required intubation.  Nutrition Focused Physical Exam:  Subcutaneous Fat:  Orbital Region: WNL Upper Arm Region: WNL Thoracic and Lumbar Region: NA  Muscle:  Temple Region: mild depletion Clavicle Bone Region: mild depletion Clavicle and Acromion Bone Region: mild depletion Scapular Bone Region: NA Dorsal Hand: mild depletion Patellar Region: WNL Anterior Thigh Region: WNL Posterior Calf Region: moderate depletion  Edema: none  Patient is currently intubated on ventilator support MV: 9.4 L/min Temp (24hrs), Avg:98.9 F (37.2 C), Min:97.2 F (36.2 C), Max:99.3 F (37.4 C)  Propofol: none   Height: Ht Readings from Last 1 Encounters:  06/17/14 5' 1.02" (1.55 m)    Weight: Wt Readings from Last 1 Encounters:  06/17/14 128 lb 15.5 oz (58.5 kg)    Ideal Body Weight: 47.7 kg  % Ideal Body Weight: 123%  Wt Readings from Last 10 Encounters:  06/17/14 128 lb 15.5 oz (58.5 kg)  05/27/14 127 lb 1.9 oz (57.661 kg)   04/29/14 127 lb 1.9 oz (57.661 kg)  04/17/14 127 lb 6.4 oz (57.788 kg)  02/07/14 129 lb (58.514 kg)  09/09/13 129 lb (58.514 kg)  08/07/13 127 lb (57.607 kg)  07/25/13 128 lb (58.06 kg)  05/07/13 127 lb (57.607 kg)  04/29/13 127 lb (57.607 kg)    Usual Body Weight: 127-129 lb  % Usual Body Weight: 100%  BMI:  Body mass index is 24.35 kg/(m^2).  Estimated Nutritional Needs: Kcal: 1274 Protein: 80-90 gm Fluid: 1.3-1.5 L  Skin: WDL  Diet Order:  NPO  EDUCATION NEEDS: -Education not appropriate at this time   Intake/Output Summary (Last 24 hours) at 06/17/14 1405 Last data filed at 06/17/14 1321  Gross per 24 hour  Intake 840.38 ml  Output   1489 ml  Net -648.62 ml    Last BM: PTA   Labs:   Recent Labs Lab 06/16/14 0751 06/16/14 2030 06/17/14 0445  NA 137 133* 134*  K 3.9 4.0 4.2  CL 97 99 99  CO2 24 21 22   BUN 40* 38* 40*  CREATININE 2.15* 2.16* 2.37*  CALCIUM 8.7 7.9* 8.3*  MG  --   --  2.0  PHOS  --   --  3.6  GLUCOSE 113* 125* 112*    CBG (last 3)   Recent Labs  06/16/14 1456 06/16/14 1535  GLUCAP 166* 161*    Scheduled Meds: . antiseptic oral rinse  15 mL Mouth Rinse QID  . aspirin  81 mg Oral Daily  . azithromycin  500 mg Intravenous Q24H  . B-complex with vitamin  C  1 tablet Oral Daily  . chlorhexidine  15 mL Mouth Rinse BID  . feeding supplement (PRO-STAT SUGAR FREE 64)  30 mL Per Tube BID  . feeding supplement (VITAL HIGH PROTEIN)  1,000 mL Per Tube Q24H  . furosemide  40 mg Intravenous Q12H  . metoprolol  5 mg Intravenous 4 times per day  . midazolam  4 mg Intravenous Once  . pantoprazole (PROTONIX) IV  40 mg Intravenous QHS  . piperacillin-tazobactam (ZOSYN)  IV  2.25 g Intravenous 4 times per day  . sodium chloride  3 mL Intravenous Q12H  . sodium chloride  3 mL Intravenous Q12H    Continuous Infusions: . heparin 700 Units/hr (06/17/14 1300)  . norepinephrine (LEVOPHED) Adult infusion Stopped (06/16/14 1445)    Past  Medical History  Diagnosis Date  . Lower extremity edema     bilateral  . CKD (chronic kidney disease), stage IV     a. baseline creat previously 1.6->2 - elevated during 2013-04-24 adm.  . Labile hypertension   . Anxiety   . Failure to thrive in childhood   . History of shingles July 2004    on the left side of her thorax and postherpetic neuralgia  . History of psoriasis   . History of recurrent UTIs   . Major depressive disorder, single episode, severe 25-Apr-2003    "when my husband died"  . Valvular heart disease     a.  11/2012 Echo: Mild AS/AI/MR;  b. 02/2013 Echo: EF 55-60%, mildly dil LA, calcified AV/MV.  Marland Kitchen CAD (coronary artery disease)     a. NSTEMI 05/2012 s/p DES to OM (residual severe LCx subbranch dz for med rx unless refractory angina recurs) b. NSTEMI 04-24-13 felt 2/2 demand in setting of resp failure, for med rx.  Marland Kitchen LBBB (left bundle branch block)     intermittent  . Chronic combined systolic and diastolic CHF (congestive heart failure)     a. 05/2012 Echo: EF 60-65%;  b. 07/2012 Echo in setting of afib: EF 35%;  c. 02/2013 Echo: EF 55-60%, mildly dil LA.  Marland Kitchen PAF (paroxysmal atrial fibrillation)     a. Dx 07/2012 -> Amio started but later dc'd 2/2 nausea/NSR. b. 02/2013 Coumadin initiated, dc'd Apr 24, 2013 2/2 allergic reaction. c. Ultimately not on NOAC due to age, Coumadin allergy and low CrCl.  Marland Kitchen Respiratory failure     a. Requiring intubation 04-24-13, secondary to cardiogenic pulmonary edema.    Past Surgical History  Procedure Laterality Date  . Salpingoophorectomy      "cyst removed"  . Appendectomy    . Breast cyst excision    . Abdominal hysterectomy    . Cataract extraction, bilateral  ~ Apr 24, 2009  . Cardiac surgery      Joaquin Courts, RD, LDN, CNSC Pager [phone] After Hours Pager [phone]

## 2014-06-17 NOTE — Progress Notes (Signed)
  Echocardiogram 2D Echocardiogram has been performed.  Leta JunglingCooper, Ayvin Lipinski M 06/17/2014, 12:44 PM

## 2014-06-17 NOTE — Progress Notes (Signed)
ANTICOAGULATION CONSULT NOTE - Follow Up Consult  Pharmacy Consult for Heparin Indication: NSTEMI  Allergies  Allergen Reactions  . Metoprolol Swelling    Takes bisoprolol without issue  . Norvasc [Amlodipine Besylate] Swelling    Takes nisoldipine without issue  . Sulfonamide Derivatives Hives, Itching and Swelling  . Amiodarone Nausea And Vomiting  . Medrol [Methylprednisolone]     Fluid retention/CHF exacerbation - caution with steroids in future if ever needed  . Statins     Myalgias - "I won't take them"  . Coumadin [Warfarin Sodium] Rash  . Latex Itching    Patient Measurements: Height: 5' 1.02" (155 cm) Weight: 128 lb 15.5 oz (58.5 kg) IBW/kg (Calculated) : 47.86 Heparin Dosing Weight: 58.5 kg  Vital Signs: Temp: 99 F (37.2 C) (07/21 1130) Temp src: Core (Comment) (07/21 0800) BP: 154/60 mmHg (07/21 1000) Pulse Rate: 80 (07/21 1130)  Labs:  Recent Labs  06/16/14 0751 06/16/14 1454 06/16/14 2030 06/16/14 2054 06/17/14 0345 06/17/14 0445  HGB 9.4*  --   --   --   --  9.0*  HCT 28.7*  --   --   --   --  27.3*  PLT 297  --   --   --   --  287  HEPARINUNFRC  --   --  0.39  --   --  0.34  CREATININE 2.15*  --  2.16*  --   --  2.37*  TROPONINI  --  3.80*  --  8.25* 10.38*  --     Estimated Creatinine Clearance: 15.1 ml/min (by C-G formula based on Cr of 2.37).   Medications:  Heparin @ 700 units/hr  Assessment: 84 YOF who continues on heparin for NSTEMI with a therapeutic heparin level this morning (HL 0.34 << 0.39, goal of 0.3-0.7). Hgb/Hct/Plt stable - no overt s/sx of bleeding noted.   Goal of Therapy:  Heparin level 0.3-0.7 units/ml Monitor platelets by anticoagulation protocol: Yes   Plan:  1. Continue heparin at 700 units/hr (7 ml/hr) 2. Will continue to monitor for any signs/symptoms of bleeding and will follow up with heparin level in the a.m.   Georgina PillionElizabeth Aydin Hink, PharmD, BCPS Clinical Pharmacist Pager: [phone] 06/17/2014 11:57 AM

## 2014-06-17 NOTE — Progress Notes (Addendum)
Cardiologist: Dr. Excell Seltzerooper  Subjective:  She is currently lucid, answers questions, weaning from ventilator very well. Denies any chest pain.  Objective:  Vital Signs in the last 24 hours: Temp:  [97.2 F (36.2 C)-99.3 F (37.4 C)] 99.2 F (37.3 C) (07/21 1000) Pulse Rate:  [62-99] 79 (07/21 1000) Resp:  [17-34] 20 (07/21 1000) BP: (106-157)/(47-92) 154/60 mmHg (07/21 1000) SpO2:  [92 %-100 %] 99 % (07/21 1000) FiO2 (%):  [30 %-60 %] 40 % (07/21 0714) Weight:  [128 lb 1.4 oz (58.1 kg)-128 lb 15.5 oz (58.5 kg)] 128 lb 15.5 oz (58.5 kg) (07/21 0400)  Intake/Output from previous day: 07/20 0701 - 07/21 0700 In: 1651.4 [I.V.:1361.4; NG/GT:90; IV Piggyback:200] Out: 1265 [Urine:1265]   Physical Exam: General: Well developed, well nourished, in no acute distress.elderly. ET tube in place Head:  Normocephalic and atraumatic. Lungs: Clear to auscultation and percussion. Heart: Normal S1 and S2.  No murmur, rubs or gallops.  Abdomen: soft, non-tender, positive bowel sounds. Extremities: No clubbing or cyanosis. No edema. Neurologic: Alert and oriented x 3.    Lab Results:  Recent Labs  06/16/14 0751 06/17/14 0445  WBC 7.8 12.3*  HGB 9.4* 9.0*  PLT 297 287    Recent Labs  06/16/14 2030 06/17/14 0445  NA 133* 134*  K 4.0 4.2  CL 99 99  CO2 21 22  GLUCOSE 125* 112*  BUN 38* 40*  CREATININE 2.16* 2.37*    Recent Labs  06/16/14 2054 06/17/14 0345  TROPONINI 8.25* 10.38*   Hepatic Function Panel  Recent Labs  06/16/14 0751  PROT 7.1  ALBUMIN 3.4*  AST 19  ALT 10  ALKPHOS 66  BILITOT 0.2*   Imaging: Dg Chest 2 View  06/16/2014   CLINICAL DATA:  Cough, nausea  EXAM: CHEST  2 VIEW  COMPARISON:  04/10/2013  FINDINGS: The aorta is unfolded and ectatic. Mild moderate enlargement of the cardiac silhouette is noted. Central vascular congestion is identified with peripheral Kerley B-lines compatible with interstitial edema. Trace effusions are noted.  Hyperinflation suggests emphysema. Atheromatous aortic calcification noted without calcified aneurysm.  IMPRESSION: Cardiomegaly with mild interstitial pulmonary edema and trace pleural effusions.   Electronically Signed   By: Christiana PellantGretchen  Green M.D.   On: 06/16/2014 08:27   Dg Chest Port 1 View  06/17/2014   CLINICAL DATA:  Shortness of breath.  EXAM: PORTABLE CHEST - 1 VIEW  COMPARISON:  06/16/2014.  FINDINGS: Endotracheal tube, NG tube, right IJ line in stable position. Stable bibasilar atelectasis and/or infiltrates. No pleural effusion or pneumothorax. Heart size and pulmonary vascularity stable. No acute osseous abnormality.  IMPRESSION: 1. Stable line and tube positions. 2. Stable bibasilar atelectasis and/or infiltrates.   Electronically Signed   By: Maisie Fushomas  Register   On: 06/17/2014 07:14   Dg Chest Port 1 View  06/16/2014   CLINICAL DATA:  EvaluateRUTLC  EXAM: PORTABLE CHEST - 1 VIEW  COMPARISON:  Chest radiograph 06/16/2014  FINDINGS: Interval intubation with endotracheal tube terminating within the mid trachea. Enteric tube courses inferior to the diaphragm, tip not included on this examination. Right IJ central venous catheter tip projects at the superior cavoatrial junction.  Stable cardiac and mediastinal contours. Slight interval improvement in bilateral interstitial pulmonary opacities. Persistent retrocardiac consolidative opacity. No definite pleural effusion or pneumothorax.  IMPRESSION: ET tube terminates in the mid trachea.  Enteric tube courses inferior to the diaphragm, tip not included on this examination.  Right IJ central venous catheter tip projects at the superior  cavoatrial junction. No definite right-sided pneumothorax.  Persistent consolidation in the retrocardiac region may represent atelectasis or infection.  Slight improvement parahilar interstitial opacities, potentially improving pulmonary edema.   Electronically Signed   By: Annia Belt M.D.   On: 06/16/2014 13:49   Dg  Chest Portable 1 View  06/16/2014   CLINICAL DATA:  Chest pain, shortness of breath.  EXAM: PORTABLE CHEST - 1 VIEW  COMPARISON:  Chest radiograph 06/16/2014.  FINDINGS: Stable cardiac and mediastinal contours. Interval increase in bilateral interstitial pulmonary opacities. Retrocardiac consolidation with obscuration of the left hemidiaphragm. No definite pleural effusion or pneumothorax.  IMPRESSION: Interval development of bilateral interstitial pulmonary opacities, suggestive of congestive heart failure.  Retrocardiac consolidative opacity favored to represent atelectasis. Infection not excluded.   Electronically Signed   By: Annia Belt M.D.   On: 06/16/2014 12:33   Dg Abd Portable 1v  06/16/2014   CLINICAL DATA:  OG tube placement.  EXAM: PORTABLE ABDOMEN - 1 VIEW  COMPARISON:  CT abdomen and pelvis 10/11/2003.  FINDINGS: OG tube is in place with the tip in the body of the stomach. Left basilar airspace disease is noted.  IMPRESSION: NG tube tip is in the stomach.  Left basilar airspace disease.   Electronically Signed   By: Drusilla Kanner M.D.   On: 06/16/2014 15:58   Telemetry: Sinus rhythm, no adverse arrhythmias Personally viewed.   EKG:  06/17/14-left bundle branch block, decreased ST segment deviation in lateral leads when compared to prior  Cardiac Studies:  Echocardiogram 04/24/14-ejection fraction 40%, mild aortic stenosis  . antiseptic oral rinse  15 mL Mouth Rinse QID  . aspirin  81 mg Oral Daily  . B-complex with vitamin C  1 tablet Oral Daily  . chlorhexidine  15 mL Mouth Rinse BID  . hydrocortisone sod succinate (SOLU-CORTEF) inj  50 mg Intravenous Q6H  . midazolam  4 mg Intravenous Once  . pantoprazole (PROTONIX) IV  40 mg Intravenous QHS  . piperacillin-tazobactam (ZOSYN)  IV  2.25 g Intravenous 4 times per day  . sodium chloride  3 mL Intravenous Q12H  . sodium chloride  3 mL Intravenous Q12H  . [START ON 06/18/2014] vancomycin  750 mg Intravenous Q48H   Assessment/Plan:   Principal Problem:   Acute respiratory failure Active Problems:   Chest pain   Acute on chronic combined systolic and diastolic CHF, NYHA class 3   CKD (chronic kidney disease), stage IV   Acute pulmonary edema   UTI (lower urinary tract infection)   Septic shock   Acute exacerbation of CHF (congestive heart failure)   ACS (abdominal compartment syndrome)   Non-STEMI (non-ST elevated myocardial infarction)  80 year-old with non-ST elevation myocardial infarction in the setting of acute respiratory failure with acute on chronic systolic heart failure, chronic kidney disease stage IV.  1. Non-ST elevation myocardial infarction- awaiting echocardiogram to once again evaluate left ventricular systolic function which previously was moderately reduced at approximately 40%. I agree with Dr. Elease Hashimoto that we should consider cardiac catheterization possibly later on this week as respiratory status improves and hopefully renal function improves as well. Risk of cardiac catheterization/worsening acute kidney injury is high. Continue with aggressive medical management at this point. Aspirin, heparin. No beta blocker because of previous allergic reaction. No statin because of previous allergy. Add back isosorbide when able.  2. Acute respiratory failure-improving. Per critical care medicine, she may be able to be extubated  3. Chronic kidney disease stage IV  4. Coronary  artery disease-as reviewed , Dr. Excell Seltzer is her cardiologist  5. Chronic systolic heart failure-previous ejection fraction 40%. May be challenging to manage fluid status with her chronic kidney disease. No beta blocker because of prior allergy. No ACE inhibitor because of renal disease.  We will continue to follow. I discussed with 2 family members.  SKAINS, MARK 06/17/2014, 11:21 AM

## 2014-06-17 NOTE — H&P (Signed)
Patient seen and examined, agree with above note.  I dictated the care and orders written for this patient under my direction.  Wesam G Yacoub, MD [phone] 

## 2014-06-17 NOTE — Progress Notes (Signed)
PULMONARY / CRITICAL CARE MEDICINE   Name: [name] MRN: [id number] DOB: 1930-10-30    ADMISSION DATE:  06/16/2014 CONSULTATION DATE:  06/16/2014  REFERRING MD :  EDP  CHIEF COMPLAINT:  Respiratory failure and hypotension  INITIAL PRESENTATION: 80 year old female with extensive cardiac history who presents to the hospital with the chief complaint of SOB.  While in the ED, became hypotensive, given fluid, developed acute respiratory failure due to pulmonary edema.  Required intubation.  SIGNIFICANT EVENTS: 7/20 Admit with chest pain, acute respiratory failure, UTI. 7/20 ETT  Access  ETT 7/20>>> R IJ TLC 7/20>>> Foley 7/20>>> OGT 7/20>>>  Abx Vancomycin 7/20>>> Zosyn 7/20>>>  Cultures Blood 7/20>>> Urine 7/20>>> Sputum 7/20>>>  SUBJECTIVE: no pressors, weaning  VITAL SIGNS: Temp:  [97.2 F (36.2 C)-99.3 F (37.4 C)] 99 F (37.2 C) (07/21 1130) Pulse Rate:  [62-99] 80 (07/21 1130) Resp:  [17-34] 19 (07/21 1130) BP: (122-157)/(47-92) 154/60 mmHg (07/21 1000) SpO2:  [97 %-100 %] 100 % (07/21 1130) FiO2 (%):  [30 %-60 %] 40 % (07/21 1130) Weight:  [58.1 kg (128 lb 1.4 oz)-58.5 kg (128 lb 15.5 oz)] 58.5 kg (128 lb 15.5 oz) (07/21 0400) HEMODYNAMICS: CVP:  [5 mmHg-12 mmHg] 12 mmHg VENTILATOR SETTINGS: Vent Mode:  [-] PSV;CPAP FiO2 (%):  [30 %-60 %] 40 % Set Rate:  [18 bmp] 18 bmp Vt Set:  [450 mL] 450 mL PEEP:  [5 cmH20] 5 cmH20 Pressure Support:  [5 cmH20-10 cmH20] 5 cmH20 Plateau Pressure:  [17 cmH20-21 cmH20] 20 cmH20 INTAKE / OUTPUT: Intake/Output     07/20 0701 - 07/21 0700 07/21 0701 - 07/22 0700   I.V. (mL/kg) 1361.4 (23.3) 73 (1.2)   NG/GT 90    IV Piggyback 200    Total Intake(mL/kg) 1651.4 (28.2) 73 (1.2)   Urine (mL/kg/hr) 1265 125 (0.4)   Total Output 1265 125   Net +386.4 -52          PHYSICAL EXAMINATION: General:  Ett, no distress Neuro:  Follows commands, rass -1 HEENT:  /AT, PERRL Cardiovascular:  RRR, Nl S1/S2,  -M/R/G. Lungs:  coarse Abdomen:  Soft, NT, ND and +BS. Musculoskeletal:  -edema and -tenderness Skin:  Intact.  LABS:  CBC  Recent Labs Lab 06/16/14 0751 06/17/14 0445  WBC 7.8 12.3*  HGB 9.4* 9.0*  HCT 28.7* 27.3*  PLT 297 287   Coag's No results found for this basename: APTT, INR,  in the last 168 hours BMET  Recent Labs Lab 06/16/14 0751 06/16/14 2030 06/17/14 0445  NA 137 133* 134*  K 3.9 4.0 4.2  CL 97 99 99  CO2 _0 BUN 40* 38* 40*  CREATININE 2.15* 2.16* 2.37*  GLUCOSE 113* 125* 112*   Electrolytes  Recent Labs Lab 06/16/14 0751 06/16/14 2030 06/17/14 0445  CALCIUM 8.7 7.9* 8.3*  MG  --   --  2.0  PHOS  --   --  3.6   Sepsis Markers No results found for this basename: LATICACIDVEN, PROCALCITON, O2SATVEN,  in the last 168 hours ABG  Recent Labs Lab 06/16/14 1217 06/16/14 1356 06/17/14 0435  PHART 7.182* 7.238* 7.384  PCO2ART 62.3* 53.7* 36.2  PO2ART 115.0* 167.0* 157.0*   Liver Enzymes  Recent Labs Lab 06/16/14 0751  AST 19  ALT 10  ALKPHOS 66  BILITOT 0.2*  ALBUMIN 3.4*   Cardiac Enzymes  Recent Labs Lab 06/16/14 0751 06/16/14 1454 06/16/14 2054 06/17/14 0345  TROPONINI  --  3.80* 8.25* 10.38*  PROBNP 4799.0*  --   --   --    Glucose  Recent Labs Lab 06/16/14 1456 06/16/14 1535  GLUCAP 166* 161*    Imaging Dg Chest 2 View  06/16/2014   CLINICAL DATA:  Cough, nausea  EXAM: CHEST  2 VIEW  COMPARISON:  04/10/2013  FINDINGS: The aorta is unfolded and ectatic. Mild moderate enlargement of the cardiac silhouette is noted. Central vascular congestion is identified with peripheral Kerley B-lines compatible with interstitial edema. Trace effusions are noted. Hyperinflation suggests emphysema. Atheromatous aortic calcification noted without calcified aneurysm.  IMPRESSION: Cardiomegaly with mild interstitial pulmonary edema and trace pleural effusions.   Electronically Signed   By: Conchita Paris M.D.   On: 06/16/2014  08:27   Dg Chest Port 1 View  06/17/2014   CLINICAL DATA:  Shortness of breath.  EXAM: PORTABLE CHEST - 1 VIEW  COMPARISON:  06/16/2014.  FINDINGS: Endotracheal tube, NG tube, right IJ line in stable position. Stable bibasilar atelectasis and/or infiltrates. No pleural effusion or pneumothorax. Heart size and pulmonary vascularity stable. No acute osseous abnormality.  IMPRESSION: 1. Stable line and tube positions. 2. Stable bibasilar atelectasis and/or infiltrates.   Electronically Signed   By: Marcello Moores  Register   On: 06/17/2014 07:14   Dg Chest Port 1 View  06/16/2014   CLINICAL DATA:  EvaluateRUTLC  EXAM: PORTABLE CHEST - 1 VIEW  COMPARISON:  Chest radiograph 06/16/2014  FINDINGS: Interval intubation with endotracheal tube terminating within the mid trachea. Enteric tube courses inferior to the diaphragm, tip not included on this examination. Right IJ central venous catheter tip projects at the superior cavoatrial junction.  Stable cardiac and mediastinal contours. Slight interval improvement in bilateral interstitial pulmonary opacities. Persistent retrocardiac consolidative opacity. No definite pleural effusion or pneumothorax.  IMPRESSION: ET tube terminates in the mid trachea.  Enteric tube courses inferior to the diaphragm, tip not included on this examination.  Right IJ central venous catheter tip projects at the superior cavoatrial junction. No definite right-sided pneumothorax.  Persistent consolidation in the retrocardiac region may represent atelectasis or infection.  Slight improvement parahilar interstitial opacities, potentially improving pulmonary edema.   Electronically Signed   By: Lovey Newcomer M.D.   On: 06/16/2014 13:49   Dg Chest Portable 1 View  06/16/2014   CLINICAL DATA:  Chest pain, shortness of breath.  EXAM: PORTABLE CHEST - 1 VIEW  COMPARISON:  Chest radiograph 06/16/2014.  FINDINGS: Stable cardiac and mediastinal contours. Interval increase in bilateral interstitial pulmonary  opacities. Retrocardiac consolidation with obscuration of the left hemidiaphragm. No definite pleural effusion or pneumothorax.  IMPRESSION: Interval development of bilateral interstitial pulmonary opacities, suggestive of congestive heart failure.  Retrocardiac consolidative opacity favored to represent atelectasis. Infection not excluded.   Electronically Signed   By: Lovey Newcomer M.D.   On: 06/16/2014 12:33   Dg Abd Portable 1v  06/16/2014   CLINICAL DATA:  OG tube placement.  EXAM: PORTABLE ABDOMEN - 1 VIEW  COMPARISON:  CT abdomen and pelvis 10/11/2003.  FINDINGS: OG tube is in place with the tip in the body of the stomach. Left basilar airspace disease is noted.  IMPRESSION: NG tube tip is in the stomach.  Left basilar airspace disease.   Electronically Signed   By: Inge Rise M.D.   On: 06/16/2014 15:58     CXR: Diffuse pulmonary edema.  ASSESSMENT / PLAN:  PULMONARY A: Acute respiratory failure due to acute pulmonary edema, ?ARDS with sepsis as well from UTI. P:   -  ABG reviewed, keep same MV on vent -wean cpap5 ps 5, goal 1 hr, assess rsbi -even to neg balance goal -pcxr in am   CARDIOVASCULAR A:  Septic shock Chest Pain  Elevated Troponin - likely demand ischemia, r/o ACS Combined CHF PAF - not on anti-coagulation due to allergy to coumadin Hx Labile HTN Hx LBBB, Valvular Heart Disease P:  - CVP 12 noted, kvo - Heparin gtt - Cardiology consulted - Trend troponin  To peak -cort noted, no role steroids -MAP goal met on own  RENAL A:   CKD - baseline sr cr approx 2.5 P:   -cvp 12, kvo -consider lasix in am  -chem in am  GASTROINTESTINAL A:  No active issues P:   - start feeds -ppi  HEMATOLOGIC A:  Leukocytosis P:  - CBC in am  -hep drip for ACS  INFECTIOUS A:   UTI R/O PNA. P:   - zosyn -dc vanc, unlikely infectious - F/U on cultures. -pct algo to dc abx - add atypical coverage as frmo home  ENDOCRINE A:   No diabetes history. R/O AI   P:   - no role roids - Cortisol level wnl   NEUROLOGIC A:   Acute Encephalopathy - secondary to hypercarbia P: - PAD 1 -avoid versed  TODAY'S SUMMARY:weaning well, heparin started, add TF, keep ett for now  I have personally obtained a history, examined the patient, evaluated laboratory and imaging results, formulated the assessment and plan and placed orders.  CRITICAL CARE: The patient is critically ill with multiple organ systems failure and requires high complexity decision making for assessment and support, frequent evaluation and titration of therapies, application of advanced monitoring technologies and extensive interpretation of multiple databases. Critical Care Time devoted to patient care services described in this note is 35 minutes.   Lavon Paganini. Titus Mould, MD, [place] Pgr: [facility]

## 2014-06-18 ENCOUNTER — Other Ambulatory Visit: Payer: Self-pay

## 2014-06-18 ENCOUNTER — Inpatient Hospital Stay (HOSPITAL_COMMUNITY): Payer: Medicare Other

## 2014-06-18 DIAGNOSIS — I2584 Coronary atherosclerosis due to calcified coronary lesion: Secondary | ICD-10-CM

## 2014-06-18 LAB — CBC WITH DIFFERENTIAL/PLATELET
Basophils Absolute: 0 10*3/uL (ref 0.0–0.1)
Basophils Relative: 0 % (ref 0–1)
EOS ABS: 0.1 10*3/uL (ref 0.0–0.7)
Eosinophils Relative: 1 % (ref 0–5)
HCT: 26 % — ABNORMAL LOW (ref 36.0–46.0)
Hemoglobin: 8.7 g/dL — ABNORMAL LOW (ref 12.0–15.0)
Lymphocytes Relative: 8 % — ABNORMAL LOW (ref 12–46)
Lymphs Abs: 0.9 10*3/uL (ref 0.7–4.0)
MCH: 29.8 pg (ref 26.0–34.0)
MCHC: 33.5 g/dL (ref 30.0–36.0)
MCV: 89 fL (ref 78.0–100.0)
Monocytes Absolute: 1.5 10*3/uL — ABNORMAL HIGH (ref 0.1–1.0)
Monocytes Relative: 13 % — ABNORMAL HIGH (ref 3–12)
NEUTROS ABS: 9.5 10*3/uL — AB (ref 1.7–7.7)
Neutrophils Relative %: 78 % — ABNORMAL HIGH (ref 43–77)
PLATELETS: 311 10*3/uL (ref 150–400)
RBC: 2.92 MIL/uL — ABNORMAL LOW (ref 3.87–5.11)
RDW: 12.7 % (ref 11.5–15.5)
WBC: 12 10*3/uL — ABNORMAL HIGH (ref 4.0–10.5)

## 2014-06-18 LAB — PHOSPHORUS
PHOSPHORUS: 3 mg/dL (ref 2.3–4.6)
PHOSPHORUS: 2.3 mg/dL (ref 2.3–4.6)

## 2014-06-18 LAB — BASIC METABOLIC PANEL WITH GFR
Anion gap: 16 — ABNORMAL HIGH (ref 5–15)
BUN: 51 mg/dL — ABNORMAL HIGH (ref 6–23)
CO2: 25 meq/L (ref 19–32)
Calcium: 8.2 mg/dL — ABNORMAL LOW (ref 8.4–10.5)
Chloride: 99 meq/L (ref 96–112)
Creatinine, Ser: 2.42 mg/dL — ABNORMAL HIGH (ref 0.50–1.10)
GFR calc Af Amer: 20 mL/min — ABNORMAL LOW (ref >90)
GFR calc non Af Amer: 18 mL/min — ABNORMAL LOW (ref >90)
Glucose, Bld: 117 mg/dL — ABNORMAL HIGH (ref 70–99)
Potassium: 3 meq/L — ABNORMAL LOW (ref 3.7–5.3)
Sodium: 140 meq/L (ref 137–147)

## 2014-06-18 LAB — URINE CULTURE

## 2014-06-18 LAB — PROCALCITONIN: PROCALCITONIN: 0.32 ng/mL

## 2014-06-18 LAB — GLUCOSE, CAPILLARY
GLUCOSE-CAPILLARY: 124 mg/dL — AB (ref 70–99)
GLUCOSE-CAPILLARY: 129 mg/dL — AB (ref 70–99)
Glucose-Capillary: 112 mg/dL — ABNORMAL HIGH (ref 70–99)
Glucose-Capillary: 117 mg/dL — ABNORMAL HIGH (ref 70–99)
Glucose-Capillary: 167 mg/dL — ABNORMAL HIGH (ref 70–99)
Glucose-Capillary: 94 mg/dL (ref 70–99)

## 2014-06-18 LAB — TROPONIN I: TROPONIN I: 5.58 ng/mL — AB (ref <0.30)

## 2014-06-18 LAB — HEPARIN LEVEL (UNFRACTIONATED): Heparin Unfractionated: 0.36 [IU]/mL (ref 0.30–0.70)

## 2014-06-18 LAB — MAGNESIUM
MAGNESIUM: 2 mg/dL (ref 1.5–2.5)
Magnesium: 2 mg/dL (ref 1.5–2.5)

## 2014-06-18 MED ORDER — POTASSIUM CHLORIDE 20 MEQ/15ML (10%) PO LIQD
40.0000 meq | Freq: Once | ORAL | Status: AC
  Administered 2014-06-18: 40 meq
  Filled 2014-06-18: qty 30

## 2014-06-18 MED ORDER — ISOSORBIDE MONONITRATE ER 30 MG PO TB24
30.0000 mg | ORAL_TABLET | Freq: Every day | ORAL | Status: DC
  Administered 2014-06-18 – 2014-06-20 (×3): 30 mg via ORAL
  Filled 2014-06-18 (×3): qty 1

## 2014-06-18 MED ORDER — CLOPIDOGREL BISULFATE 75 MG PO TABS
75.0000 mg | ORAL_TABLET | Freq: Every day | ORAL | Status: DC
  Administered 2014-06-18 – 2014-06-20 (×3): 75 mg
  Filled 2014-06-18 (×4): qty 1

## 2014-06-18 NOTE — Progress Notes (Signed)
PULMONARY / CRITICAL CARE MEDICINE   Name: Burt KnackCecelia W Mulgrew MRN: [id number] DOB: 09/29/1930    ADMISSION DATE:  06/16/2014 CONSULTATION DATE:  06/16/2014  REFERRING MD :  EDP  CHIEF COMPLAINT:  Respiratory failure and hypotension  INITIAL PRESENTATION: 80 year old female with extensive cardiac history who presents to the hospital with the chief complaint of SOB.  While in the ED, became hypotensive, given fluid, developed acute respiratory failure due to pulmonary edema.  Required intubation.  SIGNIFICANT EVENTS: 7/20 - Admit with chest pain, acute respiratory failure, UTI. Intubated 7/21- neg baalnce  Access  ETT 7/20>>> R IJ TLC 7/20>>> Foley 7/20>>> OGT 7/20>>>  Abx Vancomycin 7/20>>>7/21 Zosyn 7/20>>> Azithro 7/21>>>  Cultures Blood 7/20>>> NGTD Urine 7/20>>> GNRs Sputum 7/20>>>  SUBJECTIVE: Denies pain, feeling well, requesting ETT and foley out  VITAL SIGNS: Temp:  [99 F (37.2 C)-100 F (37.8 C)] 99.5 F (37.5 C) (07/22 0700) Pulse Rate:  [49-89] 49 (07/22 0700) Resp:  [14-22] 18 (07/22 0700) BP: (136-156)/(46-63) 141/48 mmHg (07/22 0700) SpO2:  [97 %-100 %] 99 % (07/22 0700) FiO2 (%):  [40 %] 40 % (07/22 0301) Weight:  [127 lb 10.3 oz (57.9 kg)] 127 lb 10.3 oz (57.9 kg) (07/22 0500) HEMODYNAMICS: CVP:  [4 mmHg-12 mmHg] 7 mmHg VENTILATOR SETTINGS: Vent Mode:  [-] PRVC FiO2 (%):  [40 %] 40 % Set Rate:  [18 bmp] 18 bmp Vt Set:  [450 mL] 450 mL PEEP:  [5 cmH20] 5 cmH20 Pressure Support:  [5 cmH20] 5 cmH20 Plateau Pressure:  [14 cmH20-18 cmH20] 17 cmH20 INTAKE / OUTPUT: Intake/Output     07/21 0701 - 07/22 0700 07/22 0701 - 07/23 0700   I.V. (mL/kg) 454 (7.8)    NG/GT 297.5    IV Piggyback 350    Total Intake(mL/kg) 1101.5 (19)    Urine (mL/kg/hr) 1274 (0.9)    Total Output 1274     Net -172.5            PHYSICAL EXAMINATION: General:  Ett, no distress Neuro:  Follows commands, rass -1 HEENT:  [place]/AT, MMM Cardiovascular:  RRR, Nl S1/S2,  -M/R/G. Lungs:  Coarse, equal, good air movement throughout Abdomen:  Soft, NT, ND and +BS. Musculoskeletal:  -edema and -tenderness Skin:  Intact.  LABS:  CBC  Recent Labs Lab 06/16/14 0751 06/17/14 0445 06/18/14 0520  WBC 7.8 12.3* 12.0*  HGB 9.4* 9.0* 8.7*  HCT 28.7* 27.3* 26.0*  PLT 297 287 311   Coag's No results found for this basename: APTT, INR,  in the last 168 hours BMET  Recent Labs Lab 06/16/14 2030 06/17/14 0445 06/18/14 0520  NA 133* 134* 140  K 4.0 4.2 3.0*  CL 99 99 99  CO2 21 22 25   BUN 38* 40* 51*  CREATININE 2.16* 2.37* 2.42*  GLUCOSE 125* 112* 117*   Electrolytes  Recent Labs Lab 06/16/14 2030 06/17/14 0445 06/17/14 1836 06/18/14 0520  CALCIUM 7.9* 8.3*  --  8.2*  MG  --  2.0 2.0 2.0  PHOS  --  3.6 4.1 3.0   Sepsis Markers  Recent Labs Lab 06/17/14 1308  PROCALCITON 0.32   ABG  Recent Labs Lab 06/16/14 1217 06/16/14 1356 06/17/14 0435  PHART 7.182* 7.238* 7.384  PCO2ART 62.3* 53.7* 36.2  PO2ART 115.0* 167.0* 157.0*   Liver Enzymes  Recent Labs Lab 06/16/14 0751  AST 19  ALT 10  ALKPHOS 66  BILITOT 0.2*  ALBUMIN 3.4*   Cardiac Enzymes  Recent Labs Lab 06/16/14 0751  06/16/14 1454 06/16/14 2054 06/17/14 0345  TROPONINI  --  3.80* 8.25* 10.38*  PROBNP 4799.0*  --   --   --    Glucose  Recent Labs Lab 06/16/14 1456 06/16/14 1535 06/17/14 1842 06/17/14 1959 06/17/14 2335 06/18/14 0341  GLUCAP 166* 161* 85 105* 94 117*    Imaging Dg Chest 2 View  06/16/2014   CLINICAL DATA:  Cough, nausea  EXAM: CHEST  2 VIEW  COMPARISON:  04/10/2013  FINDINGS: The aorta is unfolded and ectatic. Mild moderate enlargement of the cardiac silhouette is noted. Central vascular congestion is identified with peripheral Kerley B-lines compatible with interstitial edema. Trace effusions are noted. Hyperinflation suggests emphysema. Atheromatous aortic calcification noted without calcified aneurysm.  IMPRESSION: Cardiomegaly  with mild interstitial pulmonary edema and trace pleural effusions.   Electronically Signed   By: Christiana Pellant M.D.   On: 06/16/2014 08:27   Dg Chest Port 1 View  06/18/2014   CLINICAL DATA:  Evaluate endotracheal tube  EXAM: PORTABLE CHEST - 1 VIEW  COMPARISON:  Prior chest x-ray 06/17/2014  FINDINGS: The endotracheal tube is 3.2 cm above the carina. Nasogastric tube in unchanged position. Tip lies below the diaphragm presumably within the stomach. Right IJ approach central venous catheter with the tip in the mid SVC. Metallic artifact overlies the chest in the form of multiple cardiac leads.  Stable cardiac and mediastinal contours with borderline cardiomegaly. Probable small layering left pleural effusion with associated atelectasis versus infiltrate. Background changes of COPD with pulmonary hyperexpansion, central bronchitic changes and diffuse mild interstitial prominence. Minimal pulmonary vascular congestion without interstitial edema. No acute osseous abnormality. No pneumothorax.  IMPRESSION: 1. No significant interval change in the appearance of the chest over the last 24 hr. However, compared to 06/16/2014 there has been interval resolution of CHF. 2. Residual minimal pulmonary vascular congestion without edema and left basilar opacity favored to reflect a small layering effusion and atelectasis. Superimposed infiltrate difficult to exclude radiographically. 3. Stable and satisfactory support apparatus.   Electronically Signed   By: Malachy Moan M.D.   On: 06/18/2014 07:26   Dg Chest Port 1 View  06/17/2014   CLINICAL DATA:  Shortness of breath.  EXAM: PORTABLE CHEST - 1 VIEW  COMPARISON:  06/16/2014.  FINDINGS: Endotracheal tube, NG tube, right IJ line in stable position. Stable bibasilar atelectasis and/or infiltrates. No pleural effusion or pneumothorax. Heart size and pulmonary vascularity stable. No acute osseous abnormality.  IMPRESSION: 1. Stable line and tube positions. 2. Stable  bibasilar atelectasis and/or infiltrates.   Electronically Signed   By: Maisie Fus  Register   On: 06/17/2014 07:14   Dg Chest Port 1 View  06/16/2014   CLINICAL DATA:  EvaluateRUTLC  EXAM: PORTABLE CHEST - 1 VIEW  COMPARISON:  Chest radiograph 06/16/2014  FINDINGS: Interval intubation with endotracheal tube terminating within the mid trachea. Enteric tube courses inferior to the diaphragm, tip not included on this examination. Right IJ central venous catheter tip projects at the superior cavoatrial junction.  Stable cardiac and mediastinal contours. Slight interval improvement in bilateral interstitial pulmonary opacities. Persistent retrocardiac consolidative opacity. No definite pleural effusion or pneumothorax.  IMPRESSION: ET tube terminates in the mid trachea.  Enteric tube courses inferior to the diaphragm, tip not included on this examination.  Right IJ central venous catheter tip projects at the superior cavoatrial junction. No definite right-sided pneumothorax.  Persistent consolidation in the retrocardiac region may represent atelectasis or infection.  Slight improvement parahilar interstitial opacities, potentially improving pulmonary  edema.   Electronically Signed   By: Annia Belt M.D.   On: 06/16/2014 13:49   Dg Chest Portable 1 View  06/16/2014   CLINICAL DATA:  Chest pain, shortness of breath.  EXAM: PORTABLE CHEST - 1 VIEW  COMPARISON:  Chest radiograph 06/16/2014.  FINDINGS: Stable cardiac and mediastinal contours. Interval increase in bilateral interstitial pulmonary opacities. Retrocardiac consolidation with obscuration of the left hemidiaphragm. No definite pleural effusion or pneumothorax.  IMPRESSION: Interval development of bilateral interstitial pulmonary opacities, suggestive of congestive heart failure.  Retrocardiac consolidative opacity favored to represent atelectasis. Infection not excluded.   Electronically Signed   By: Annia Belt M.D.   On: 06/16/2014 12:33   Dg Abd Portable  1v  06/16/2014   CLINICAL DATA:  OG tube placement.  EXAM: PORTABLE ABDOMEN - 1 VIEW  COMPARISON:  CT abdomen and pelvis 10/11/2003.  FINDINGS: OG tube is in place with the tip in the body of the stomach. Left basilar airspace disease is noted.  IMPRESSION: NG tube tip is in the stomach.  Left basilar airspace disease.   Electronically Signed   By: Drusilla Kanner M.D.   On: 06/16/2014 15:58     CXR: Diffuseint infiltrates, slight better rt base, ett wnl  ASSESSMENT / PLAN:  PULMONARY A: Acute respiratory failure due to acute pulmonary edema, ?ARDS with sepsis as well from UTI. P:   -wean cpap5 ps 5, goal 1 hr, assess rsbi, mental status is excellent , follow for HTN, tachy in setting ischemia? -even goals., neg balance goal successful with bun/ctr rise -pcxr in am   CARDIOVASCULAR A:  Septic shock Chest Pain  Elevated Troponin - likely demand ischemia, r/o ACS, still trending up Combined CHF PAF - not on anti-coagulation due to allergy to coumadin Hx Labile HTN Hx LBBB, Valvular Heart Disease P:  - CVP 4, even balance goals - Heparin gtt - Cardiology following -restart plavix  RENAL A:   CKD - baseline sr cr approx 2.5, some rise hypokalemia P:   -Cvp 7, kvo -Lasix 40 IV Q12 - dc -chem in am -supp k   GASTROINTESTINAL A:  No active issues P:   -TFs hold for weaning -ppi  HEMATOLOGIC A:  Leukocytosis P:  -CBC in am  -hep drip for ACS  INFECTIOUS A: UTI Low clincial suspicion PNA, pct flat even in setting cri P:   - narrow Zosyn after  Gram neg rod noted -maintain azithro - F/U on cultures.  ENDOCRINE A:   No diabetes history. Normal cortisol  P:   - monitor cbg  NEUROLOGIC A:   Acute Encephalopathy - secondary to hypercarbia, resolved this am P: - PAD 1 - rass -1  TODAY'S SUMMARY: weaning well, hold lasix, restart plavix   Beverely Low, MD, MPH [facility] PGY-2 06/18/2014 7:44 AM  Ccm time 30 min   I have fully examined  this patient and agree with above findings.     Mcarthur Rossetti. Tyson Alias, MD, FACP Pgr: [phone] [facility]

## 2014-06-18 NOTE — Progress Notes (Signed)
ANTICOAGULATION CONSULT NOTE - Follow Up Consult  Pharmacy Consult for Heparin Indication: NSTEMI  Allergies  Allergen Reactions  . Metoprolol Swelling    Takes bisoprolol without issue  . Norvasc [Amlodipine Besylate] Swelling    Takes nisoldipine without issue  . Sulfonamide Derivatives Hives, Itching and Swelling  . Amiodarone Nausea And Vomiting  . Medrol [Methylprednisolone]     Fluid retention/CHF exacerbation - caution with steroids in future if ever needed  . Statins     Myalgias - "I won't take them"  . Coumadin [Warfarin Sodium] Rash  . Latex Itching    Patient Measurements: Height: 5' 1.02" (155 cm) Weight: 127 lb 10.3 oz (57.9 kg) IBW/kg (Calculated) : 47.86 Heparin Dosing Weight: 58.5 kg  Vital Signs: Temp: 99.2 F (37.3 C) (07/22 1200) Temp src: Core (Comment) (07/22 1200) BP: 156/56 mmHg (07/22 1200) Pulse Rate: 72 (07/22 1200)  Labs:  Recent Labs  06/16/14 0751  06/16/14 2030 06/16/14 2054 06/17/14 0345 06/17/14 0445 06/18/14 0520 06/18/14 0800  HGB 9.4*  --   --   --   --  9.0* 8.7*  --   HCT 28.7*  --   --   --   --  27.3* 26.0*  --   PLT 297  --   --   --   --  287 311  --   HEPARINUNFRC  --   --  0.39  --   --  0.34 0.36  --   CREATININE 2.15*  --  2.16*  --   --  2.37* 2.42*  --   TROPONINI  --   < >  --  8.25* 10.38*  --   --  5.58*  < > = values in this interval not displayed.  Estimated Creatinine Clearance: 14.7 ml/min (by C-G formula based on Cr of 2.42).   Medications:  Heparin @ 700 units/hr  Assessment: [name] who continues on heparin for NSTEMI with a therapeutic heparin level this morning (HL 0.36 << 0.34, goal of 0.3-0.7). Hgb/Hct/Plt stable - no overt s/sx of bleeding noted.   Goal of Therapy:  Heparin level 0.3-0.7 units/ml Monitor platelets by anticoagulation protocol: Yes   Plan:  1. Continue heparin at 700 units/hr (7 ml/hr) 2. Will continue to monitor for any signs/symptoms of bleeding and will follow up with  heparin level in the a.m.   Georgina PillionElizabeth Dayton Sherr, PharmD, BCPS Clinical Pharmacist Pager: [phone] 06/18/2014 12:54 PM

## 2014-06-18 NOTE — Procedures (Signed)
Extubation Procedure Note  Patient Details:   Name: Burt KnackCecelia W Fuhr DOB: 07/18/1930 MRN: [id number]   Airway Documentation:     Evaluation  O2 sats: stable throughout and currently acceptable Complications: No apparent complications Patient did tolerate procedure well. Bilateral Breath Sounds: Clear;Diminished Suctioning: Oral;Airway Yes  Antoine Pocherogdon, Chelsei Mcchesney Caroline 06/18/2014, 10:53 AM

## 2014-06-18 NOTE — Progress Notes (Addendum)
Cardiologist: Dr. Excell Seltzerooper  Subjective:  Denies any chest pain. Sleepy  Objective:  Vital Signs in the last 24 hours: Temp:  [99.1 F (37.3 C)-100 F (37.8 C)] 99.3 F (37.4 C) (07/22 1300) Pulse Rate:  [49-86] 74 (07/22 1300) Resp:  [18-26] 20 (07/22 1300) BP: (136-157)/(46-74) 156/58 mmHg (07/22 1300) SpO2:  [99 %-100 %] 100 % (07/22 1300) FiO2 (%):  [40 %] 40 % (07/22 1000) Weight:  [127 lb 10.3 oz (57.9 kg)] 127 lb 10.3 oz (57.9 kg) (07/22 0500)  Intake/Output from previous day: 07/21 0701 - 07/22 0700 In: 1222.5 [I.V.:475; NG/GT:297.5; IV Piggyback:450] Out: 1274 [Urine:1274]   Physical Exam: General: Well developed, well nourished, in no acute distress.elderly.  Head:  Normocephalic and atraumatic. Lungs: Clear to auscultation and percussion. Heart: Normal S1 and S2.  No murmur, rubs or gallops.  Abdomen: soft, non-tender, positive bowel sounds. Extremities: No clubbing or cyanosis. No edema. Neurologic: Alert and oriented x 3.    Lab Results:  Recent Labs  06/17/14 0445 06/18/14 0520  WBC 12.3* 12.0*  HGB 9.0* 8.7*  PLT 287 311    Recent Labs  06/17/14 0445 06/18/14 0520  NA 134* 140  K 4.2 3.0*  CL 99 99  CO2 22 25  GLUCOSE 112* 117*  BUN 40* 51*  CREATININE 2.37* 2.42*    Recent Labs  06/17/14 0345 06/18/14 0800  TROPONINI 10.38* 5.58*   Hepatic Function Panel  Recent Labs  06/16/14 0751  PROT 7.1  ALBUMIN 3.4*  AST 19  ALT 10  ALKPHOS 66  BILITOT 0.2*   Imaging: CXR - resolution of CHF.   Telemetry: Sinus rhythm, 8 beats of NSVT at 10:30 on 06/18/14. Personally viewed.   EKG:  06/17/14-left bundle branch block, decreased ST segment deviation in lateral leads when compared to prior  Cardiac Studies:  Echocardiogram -EF 50%, improved from 40% on prior. Mild AR, Mod TR   . antiseptic oral rinse  15 mL Mouth Rinse QID  . aspirin  81 mg Oral Daily  . azithromycin  500 mg Intravenous Q24H  . B-complex with vitamin C  1 tablet  Oral Daily  . chlorhexidine  15 mL Mouth Rinse BID  . clopidogrel  75 mg Per Tube Daily  . pantoprazole (PROTONIX) IV  40 mg Intravenous QHS  . piperacillin-tazobactam (ZOSYN)  IV  2.25 g Intravenous 4 times per day  . sodium chloride  3 mL Intravenous Q12H  . sodium chloride  3 mL Intravenous Q12H   Assessment/Plan:  Principal Problem:   Acute respiratory failure Active Problems:   Chest pain   Acute on chronic combined systolic and diastolic CHF, NYHA class 3   CKD (chronic kidney disease), stage IV   Acute pulmonary edema   UTI (lower urinary tract infection)   Septic shock   Acute exacerbation of CHF (congestive heart failure)   ACS (abdominal compartment syndrome)   Non-STEMI (non-ST elevated myocardial infarction)  80 year-old with non-ST elevation myocardial infarction in the setting of acute respiratory failure with acute on chronic systolic heart failure, chronic kidney disease stage IV.  1. Non-ST elevation myocardial infarction-  echocardiogram actually reassuring with EF 50%. Previously was moderately reduced at approximately 40%. I agree with Dr. Elease HashimotoNahser that we should consider cardiac catheterization possibly later on this week as respiratory status improves and hopefully renal function (creat increased to 2.5) improves as well. However it would not be unreasonable to continue with medical management as well.   Risk of cardiac  catheterization/worsening acute kidney injury is high. Continue with aggressive medical management at this point. Aspirin, heparin. Troponin increased to 10 now down to 5.5. Would plan on DC heparin IV tomorrow.  No beta blocker because of previous allergic reaction. No statin because of previous allergy. Will add back isosorbide 30mg  QD. Agree with Plavix.   2. Acute respiratory failure-improving, extubated. ABX  3. Chronic kidney disease stage IV  4. Coronary artery disease-as reviewed , Dr. Excell Seltzer is her cardiologist  5. Chronic systolic  heart failure-previous ejection fraction 40% but currently 50%, reassuring.  No beta blocker because of prior allergy. No ACE inhibitor because of renal disease.  She also relayed to me that she want DNR status. She talked it over with her children as well. She is fine at age 81 if she passes away she states and she wants to die peacefully. I will write DNR order and relay to nurse.   We will continue to follow.   [name] 06/18/2014, 2:05 PM

## 2014-06-18 NOTE — Progress Notes (Signed)
eLink Physician-Brief Progress Note Patient Name: Deanna KnackCecelia W Herrera DOB: 11/05/1930 MRN: [id number]  Date of Service  06/18/2014   HPI/Events of Note   Doing well post extubation  eICU Interventions  Start clear liquid diet   Intervention Category Minor Interventions: Routine modifications to care plan (e.g. PRN medications for pain, fever)  Ron Junco 06/18/2014, 3:57 PM

## 2014-06-19 ENCOUNTER — Inpatient Hospital Stay (HOSPITAL_COMMUNITY): Payer: Medicare Other

## 2014-06-19 ENCOUNTER — Encounter (HOSPITAL_COMMUNITY): Payer: Self-pay | Admitting: General Practice

## 2014-06-19 DIAGNOSIS — I359 Nonrheumatic aortic valve disorder, unspecified: Secondary | ICD-10-CM

## 2014-06-19 LAB — BASIC METABOLIC PANEL
Anion gap: 15 (ref 5–15)
BUN: 50 mg/dL — AB (ref 6–23)
CHLORIDE: 102 meq/L (ref 96–112)
CO2: 26 mEq/L (ref 19–32)
CREATININE: 2.38 mg/dL — AB (ref 0.50–1.10)
Calcium: 8.6 mg/dL (ref 8.4–10.5)
GFR, EST AFRICAN AMERICAN: 21 mL/min — AB (ref >90)
GFR, EST NON AFRICAN AMERICAN: 18 mL/min — AB (ref >90)
Glucose, Bld: 99 mg/dL (ref 70–99)
Potassium: 3.3 mEq/L — ABNORMAL LOW (ref 3.7–5.3)
Sodium: 143 mEq/L (ref 137–147)

## 2014-06-19 LAB — CBC WITH DIFFERENTIAL/PLATELET
Basophils Absolute: 0 10*3/uL (ref 0.0–0.1)
Basophils Relative: 1 % (ref 0–1)
EOS ABS: 0.2 10*3/uL (ref 0.0–0.7)
EOS PCT: 3 % (ref 0–5)
HCT: 27.2 % — ABNORMAL LOW (ref 36.0–46.0)
HEMOGLOBIN: 8.8 g/dL — AB (ref 12.0–15.0)
LYMPHS ABS: 1.3 10*3/uL (ref 0.7–4.0)
Lymphocytes Relative: 15 % (ref 12–46)
MCH: 28.9 pg (ref 26.0–34.0)
MCHC: 32.4 g/dL (ref 30.0–36.0)
MCV: 89.5 fL (ref 78.0–100.0)
MONOS PCT: 13 % — AB (ref 3–12)
Monocytes Absolute: 1.1 10*3/uL — ABNORMAL HIGH (ref 0.1–1.0)
NEUTROS PCT: 68 % (ref 43–77)
Neutro Abs: 5.8 10*3/uL (ref 1.7–7.7)
Platelets: 308 10*3/uL (ref 150–400)
RBC: 3.04 MIL/uL — ABNORMAL LOW (ref 3.87–5.11)
RDW: 12.6 % (ref 11.5–15.5)
WBC: 8.4 10*3/uL (ref 4.0–10.5)

## 2014-06-19 LAB — PROCALCITONIN: Procalcitonin: 0.14 ng/mL

## 2014-06-19 LAB — HEPARIN LEVEL (UNFRACTIONATED): Heparin Unfractionated: 0.48 IU/mL (ref 0.30–0.70)

## 2014-06-19 LAB — CLOSTRIDIUM DIFFICILE BY PCR: CDIFFPCR: NEGATIVE

## 2014-06-19 MED ORDER — DEXTROSE 5 % IV SOLN
1.0000 g | INTRAVENOUS | Status: DC
  Administered 2014-06-19 – 2014-06-20 (×2): 1 g via INTRAVENOUS
  Filled 2014-06-19 (×3): qty 10

## 2014-06-19 MED ORDER — HEPARIN SODIUM (PORCINE) 5000 UNIT/ML IJ SOLN
5000.0000 [IU] | Freq: Three times a day (TID) | INTRAMUSCULAR | Status: DC
  Administered 2014-06-19 – 2014-06-20 (×2): 5000 [IU] via SUBCUTANEOUS
  Filled 2014-06-19 (×3): qty 1

## 2014-06-19 MED ORDER — POTASSIUM CHLORIDE CRYS ER 20 MEQ PO TBCR
40.0000 meq | EXTENDED_RELEASE_TABLET | Freq: Once | ORAL | Status: AC
Start: 2014-06-19 — End: 2014-06-19
  Administered 2014-06-19: 40 meq via ORAL
  Filled 2014-06-19: qty 2

## 2014-06-19 MED ORDER — LORAZEPAM 0.5 MG PO TABS
0.5000 mg | ORAL_TABLET | Freq: Once | ORAL | Status: AC
  Administered 2014-06-19: 0.5 mg via ORAL
  Filled 2014-06-19: qty 1

## 2014-06-19 MED ORDER — POTASSIUM CHLORIDE 10 MEQ/100ML IV SOLN: 10.0000 meq | INTRAVENOUS | Status: DC

## 2014-06-19 MED ORDER — SODIUM CHLORIDE 0.9 % IV SOLN
INTRAVENOUS | Status: DC
Start: 2014-06-19 — End: 2014-06-20
  Administered 2014-06-19: 20 mL/h via INTRAVENOUS
  Administered 2014-06-20: 05:00:00 via INTRAVENOUS

## 2014-06-19 MED ORDER — POTASSIUM CHLORIDE 20 MEQ/15ML (10%) PO LIQD: 40.0000 meq | Freq: Once | ORAL | Status: DC

## 2014-06-19 NOTE — Progress Notes (Signed)
Cardiologist: Dr. Excell Seltzerooper  Subjective:  Denies any chest pain. Was transferred.   Objective:  Vital Signs in the last 24 hours: Temp:  [97.6 F (36.4 C)-99.5 F (37.5 C)] 97.8 F (36.6 C) (07/23 1059) Pulse Rate:  [50-90] 75 (07/23 1059) Resp:  [15-27] 20 (07/23 1059) BP: (120-160)/(45-84) 129/57 mmHg (07/23 1059) SpO2:  [94 %-100 %] 100 % (07/23 1059) Weight:  [121 lb 6.4 oz (55.067 kg)-121 lb 14.6 oz (55.3 kg)] 121 lb 6.4 oz (55.067 kg) (07/23 1059)  Intake/Output from previous day: 07/22 0701 - 07/23 0700 In: 1243.5 [I.V.:561.3; NG/GT:232.3; IV Piggyback:450] Out: 1775 [Urine:1775]   Physical Exam: General: Well developed, well nourished, in no acute distress.elderly.  Head:  Normocephalic and atraumatic. Lungs: Clear to auscultation and percussion. Heart: Normal S1 and S2.  No murmur, rubs or gallops.  Abdomen: soft, non-tender, positive bowel sounds. Extremities: No clubbing or cyanosis. No edema. Neurologic: Alert and oriented x 3.    Lab Results:  Recent Labs  06/18/14 0520 06/19/14 0525  WBC 12.0* 8.4  HGB 8.7* 8.8*  PLT 311 308    Recent Labs  06/18/14 0520 06/19/14 0525  NA 140 143  K 3.0* 3.3*  CL 99 102  CO2 25 26  GLUCOSE 117* 99  BUN 51* 50*  CREATININE 2.42* 2.38*    Recent Labs  06/17/14 0345 06/18/14 0800  TROPONINI 10.38* 5.58*   Hepatic Function Panel No results found for this basename: PROT, ALBUMIN, AST, ALT, ALKPHOS, BILITOT, BILIDIR, IBILI,  in the last 72 hours Imaging: CXR - resolution of CHF.   Telemetry: Sinus rhythm, 8 beats of NSVT at 10:30 on 06/18/14. No current adverse arrhythmia. Personally viewed.   EKG:  06/17/14-left bundle branch block, decreased ST segment deviation in lateral leads when compared to prior  Cardiac Studies:  Echocardiogram -EF 50%, improved from 40% on prior. Mild AR, Mod TR   . aspirin  81 mg Oral Daily  . B-complex with vitamin C  1 tablet Oral Daily  . cefTRIAXone (ROCEPHIN)  IV  1 g  Intravenous Q24H  . clopidogrel  75 mg Per Tube Daily  . heparin subcutaneous  5,000 Units Subcutaneous 3 times per day  . isosorbide mononitrate  30 mg Oral Daily  . potassium chloride  40 mEq Oral Once  . sodium chloride  3 mL Intravenous Q12H  . sodium chloride  3 mL Intravenous Q12H   Assessment/Plan:  Principal Problem:   Acute respiratory failure Active Problems:   Chest pain   Acute on chronic combined systolic and diastolic CHF, NYHA class 3   CKD (chronic kidney disease), stage IV   Acute pulmonary edema   UTI (lower urinary tract infection)   Septic shock   Acute exacerbation of CHF (congestive heart failure)   ACS (abdominal compartment syndrome)   Non-STEMI (non-ST elevated myocardial infarction)  [age] year-old with non-ST elevation myocardial infarction in the setting of acute respiratory failure with acute on chronic systolic heart failure, chronic kidney disease stage IV.  1. Non-ST elevation myocardial infarction-  echocardiogram actually reassuring with EF 50%. Previously was moderately reduced at approximately 40%. Given her lack of angina and continued overall improvement, let us continue with medical/ non-invasive management. No heart catheterization.   Risk of cardiac catheterization/worsening acute kidney injury is high.   If angina develops in future, will discuss with Dr. Excell Seltzerooper.   No beta blocker because of previous allergic reaction. No statin because of previous allergy. Isosorbide 30mg  QD. Agree with Plavix,  ASA.  2. Acute respiratory failure-resolved  3. Chronic kidney disease stage IV  4. Coronary artery disease-as reviewed , Dr. Excell Seltzer is her cardiologist  5. Chronic systolic heart failure-previous ejection fraction 40% but currently 50%, reassuring.  No beta blocker because of prior allergy. No ACE inhibitor because of renal disease.  She also relayed to me yesterday that she wanted DNR status. She talked it over with her children as well. She is  fine at age 80 if she passes away she states and she wants to die peacefully. I wrote DNR order.   Will continue to follow.     SKAINS, MARK 06/19/2014, 2:12 PM

## 2014-06-19 NOTE — Evaluation (Signed)
Physical Therapy Evaluation Patient Details Name: [name] MRN: [id number] DOB: [birth year] Today's Date: 06/19/2014   History of Present Illness  Pt is an 80 y/o female presenting with complaints of SOB. While in the ED, became hypotensive, given fluid, developed acute respiratory failure due to pulmonary edema.   Clinical Impression  Pt admitted with the above. Pt currently with functional limitations due to the deficits listed below (see PT Problem List). At the time of PT eval pt was able to perform transfers and ambulation with steadying assist only. Feel the RW is safest for pt at this time, and discussed general safety awareness and calling staff for assist when wanting to get up. Pt will benefit from skilled PT to increase their independence and safety with mobility to allow discharge to the venue listed below.     Follow Up Recommendations Home health PT;Supervision for mobility/OOB    Equipment Recommendations  Rolling walker with 5" wheels    Recommendations for Other Services       Precautions / Restrictions Precautions Precautions: Fall Restrictions Weight Bearing Restrictions: No      Mobility  Bed Mobility Overal bed mobility: Needs Assistance Bed Mobility: Supine to Sit     Supine to sit: Supervision;HOB elevated     General bed mobility comments: Pt was able to come to EOB with supervision and therapist to manage lines/telemetry box. Pt distracted by box hanging in her pocket.   Transfers Overall transfer level: Needs assistance Equipment used: None Transfers: Sit to/from [employer] Sit to Stand: Min guard Stand pivot transfers: Min guard       General transfer comment: VC's for general safety awareness with transition to standing, as well as for SPT to [facility]. Proper hand placement demonstrated.   Ambulation/Gait Ambulation/Gait assistance: Min guard Ambulation Distance (Feet): 50 Feet Assistive device: None Gait  Pattern/deviations: Step-through pattern;Decreased stride length;Trunk flexed Gait velocity: Decreased Gait velocity interpretation: Below normal speed for age/gender General Gait Details: Pt encouraged to ambulate outside the room, however did not want to leave the room just yet. Fearful of falling. No LOB noted however pt appeared somewhat unsteady during turns.   Stairs            Wheelchair Mobility    Modified Rankin (Stroke Patients Only)       Balance Overall balance assessment: Needs assistance Sitting-balance support: Feet supported;No upper extremity supported Sitting balance-Leahy Scale: Good     Standing balance support: No upper extremity supported;During functional activity Standing balance-Leahy Scale: Fair                               Pertinent Vitals/Pain Pt on RA during gait training, and O2 sats remained at 92%. At rest at 96%. Supplemental O2 donned again at end of session.     Home Living Family/patient expects to be discharged to:: Private residence Living Arrangements: Other relatives (Great grandson who is 61) Available Help at Discharge: Family;Available PRN/intermittently Type of Home: House Home Access: Level entry     Home Layout: One level;Laundry or work area in [employer] Equipment: Bedside commode;Crutches;Walker - standard;Shower seat      Prior Function Level of Independence: Independent         Comments: Still driving, grocery shopping, cooking, cleaning     Hand Dominance   Dominant Hand: Right    Extremity/Trunk Assessment   Upper Extremity Assessment: Defer to OT evaluation  Lower Extremity Assessment: Generalized weakness      Cervical / Trunk Assessment: Kyphotic  Communication   Communication: No difficulties  Cognition Arousal/Alertness: Awake/alert Behavior During Therapy: WFL for tasks assessed/performed Overall Cognitive Status: Within Functional Limits for tasks  assessed                      General Comments      Exercises        Assessment/Plan    PT Assessment Patient needs continued PT services  PT Diagnosis Difficulty walking;Generalized weakness   PT Problem List Decreased strength;Decreased range of motion;Decreased activity tolerance;Decreased balance;Decreased mobility;Decreased knowledge of use of DME;Decreased safety awareness;Decreased knowledge of precautions;Cardiopulmonary status limiting activity  PT Treatment Interventions DME instruction;Gait training;Stair training;Functional mobility training;Therapeutic activities;Therapeutic exercise;Neuromuscular re-education;Patient/family education   PT Goals (Current goals can be found in the Care Plan section) Acute Rehab PT Goals Patient Stated Goal: To return home with her great-grandson PT Goal Formulation: With patient/family Time For Goal Achievement: 06/26/14 Potential to Achieve Goals: Good    Frequency Min 3X/week   Barriers to discharge        Co-evaluation               End of Session Equipment Utilized During Treatment: Gait belt;Oxygen Activity Tolerance: Patient tolerated treatment well Patient left: in chair;with call bell/phone within reach;with family/visitor present Nurse Communication: Mobility status         Time: [id number] PT Time Calculation (min): 30 min   Charges:   PT Evaluation $Initial PT Evaluation Tier I: 1 Procedure PT Treatments $Gait Training: 8-22 mins $Therapeutic Activity: 8-22 mins   PT G Codes:          Ruthann CancerHamilton, Jacaria Colburn 06/19/2014, 1:32 PM  Ruthann CancerLaura Hamilton, PT, DPT Acute Rehabilitation Services Pager: [phone]

## 2014-06-19 NOTE — Progress Notes (Signed)
[facility] FOR AM LAB REPLACEMENT ONLY  The patient does not apply for the [facility] Electrolyte [facility] Protocol based on the criteria listed below:   Is GFR >/= 40 ml/min? No.  Patient's GFR today is 18   Abnormal electrolyte(s): K3.3  6. If a panic level lab has been reported, has the CCM MD in charge been notified? yes.   Physician:  Langston Masker Byrum, MD  Melrose NakayamaChisholm, Tamon Parkerson William 06/19/2014 6:52 AM

## 2014-06-19 NOTE — Progress Notes (Signed)
Pt well-known to me. She and her daughter have discussed the idea of cardiac cath. The patient has been limited by severe angina and now has presented with NSTEMI and respiratory failure. We have had extensive discussion about cardiac cath in the outpatient setting and have carefully weighed the risks and potential benefits in light of her advanced CKD. She has decided that she would like to proceed with a limited-contrast cath study to determine if revascularization might be feasible. I think it would be best to let her recover from her current illness, but cardiac cath could potentially be done next week. If she needs longer to regain strength, could do later on. I will tentatively plan on doing her procedure next Wednesday (on schedule for next Wednesday at 08:30), but this can be delayed if necessary. Recommend that she be admitted the evening prior to cath for prehydration.  Tonny BollmanMichael Demarrio Menges 06/19/2014 6:52 PM

## 2014-06-19 NOTE — Progress Notes (Signed)
IV Team paged x3 today to come and d/c central line. RN has not received a call back yet. Pt aware. RN will continue to try and contact.

## 2014-06-19 NOTE — Progress Notes (Signed)
Pt stated she is having fluttering in chest. HR 100-120 at time with frequent PVCs. No complaints of chest pain. EKG performed. BP stable. Pt had IJ removed this evening. Benedetto Coons. Callahan notified of fluttering, told RN to notify cardiologist. Pt HR now 80-90 with PVCs, pt states the fluttering has resolved. Dr. Tresa EndoKelly notified and no new orders given. Will continue to monitor.

## 2014-06-19 NOTE — Progress Notes (Signed)
PULMONARY / CRITICAL CARE MEDICINE   Name: [name] MRN: [id number] DOB: 07/06/1930    ADMISSION DATE:  06/16/2014 CONSULTATION DATE:  06/16/2014  REFERRING MD: EDP  CHIEF COMPLAINT:  Respiratory failure and hypotension  INITIAL PRESENTATION: 80 year old female with extensive cardiac history who presents to the hospital with the chief complaint of SOB.  While in the ED, became hypotensive, given fluid, developed acute respiratory failure due to pulmonary edema.  Required intubation.  SIGNIFICANT EVENTS: 7/20 - Admit with chest pain, acute respiratory failure, UTI. Intubated 7/21- Neg balance 7/22 extubation  Access  ETT 7/20>>>7/22 R IJ TLC 7/20>>> Foley 7/20>>>7/22 OGT 7/20>>>7/22  Abx Vancomycin 7/20>>>7/21 Zosyn 7/20>>>7/23 ceftraixone 7/23>>>stop 26th Azithro 7/21>>>7/23  Cultures Blood 7/20>>> NGTD Urine 7/20>>> Klebsiella  SUBJECTIVE: Denies pain, reports frequent diarrhea, requesting ice  VITAL SIGNS: Temp:  [97.6 F (36.4 C)-99.5 F (37.5 C)] 97.6 F (36.4 C) (07/23 0750) Pulse Rate:  [63-90] 71 (07/23 0700) Resp:  [15-27] 18 (07/23 0700) BP: (120-158)/(45-84) 135/84 mmHg (07/23 0700) SpO2:  [94 %-100 %] 100 % (07/23 0700) FiO2 (%):  [40 %] 40 % (07/22 1000) Weight:  [121 lb 14.6 oz (55.3 kg)] 121 lb 14.6 oz (55.3 kg) (07/23 0600) HEMODYNAMICS:   VENTILATOR SETTINGS: Vent Mode:  [-]  FiO2 (%):  [40 %] 40 % INTAKE / OUTPUT: Intake/Output     07/22 0701 - 07/23 0700 07/23 0701 - 07/24 0700   I.V. (mL/kg) 561.3 (10.1)    NG/GT 232.3    IV Piggyback 450    Total Intake(mL/kg) 1243.5 (22.5)    Urine (mL/kg/hr) 1775 (1.3)    Total Output 1775     Net -531.5          Stool Occurrence 2 x      PHYSICAL EXAMINATION: General:  no distress Neuro:  Follows commands,nonfocal HEENT:  [place]/AT, MMM Cardiovascular:  RRR, Nl S1/S2, -M/R/G. Lungs:  Coarse unchanged Abdomen:  Soft, NT, ND and +BS. Musculoskeletal:  -edema and -tenderness Skin:   Intact.  LABS:  CBC  Recent Labs Lab 06/17/14 0445 06/18/14 0520 06/19/14 0525  WBC 12.3* 12.0* 8.4  HGB 9.0* 8.7* 8.8*  HCT 27.3* 26.0* 27.2*  PLT 287 311 308   Coag's No results found for this basename: APTT, INR,  in the last 168 hours BMET  Recent Labs Lab 06/17/14 0445 06/18/14 0520 06/19/14 0525  NA 134* 140 143  K 4.2 3.0* 3.3*  CL 99 99 102  CO2 22 25 26   BUN 40* 51* 50*  CREATININE 2.37* 2.42* 2.38*  GLUCOSE 112* 117* 99   Electrolytes  Recent Labs Lab 06/17/14 0445 06/17/14 1836 06/18/14 0520 06/18/14 1235 06/19/14 0525  CALCIUM 8.3*  --  8.2*  --  8.6  MG 2.0 2.0 2.0 2.0  --   PHOS 3.6 4.1 3.0 2.3  --    Sepsis Markers  Recent Labs Lab 06/17/14 1308 06/18/14 0520 06/19/14 0525  PROCALCITON 0.32 0.32 0.14   ABG  Recent Labs Lab 06/16/14 1217 06/16/14 1356 06/17/14 0435  PHART 7.182* 7.238* 7.384  PCO2ART 62.3* 53.7* 36.2  PO2ART 115.0* 167.0* 157.0*   Liver Enzymes  Recent Labs Lab 06/16/14 0751  AST 19  ALT 10  ALKPHOS 66  BILITOT 0.2*  ALBUMIN 3.4*   Cardiac Enzymes  Recent Labs Lab 06/16/14 0751  06/16/14 2054 06/17/14 0345 06/18/14 0800  TROPONINI  --   < > 8.25* 10.38* 5.58*  PROBNP 4799.0*  --   --   --   --   < > =  values in this interval not displayed. Glucose  Recent Labs Lab 06/17/14 2335 06/18/14 0341 06/18/14 0745 06/18/14 1245 06/18/14 1538 06/18/14 1903  GLUCAP 94 117* 124* 112* 129* 167*    Imaging Dg Chest Port 1 View  06/19/2014   CLINICAL DATA:  Extubation.  EXAM: PORTABLE CHEST - 1 VIEW  COMPARISON:  05/29/2014.  FINDINGS: Right IJ line in stable position. Endotracheal tube has been removed. NG tube has been removed. Bibasilar atelectasis and left pleural effusion noted. Left lower lobe infiltrate cannot be excluded. No pneumothorax. Stable cardiomegaly. No acute osseus abnormality.  IMPRESSION: 1. Interim extubation and removal of NG tube. 2. Bibasilar atelectasis and left pleural  effusion. Left lower lobe infiltrate cannot be excluded.   Electronically Signed   By: Maisie Fus  Register   On: 06/19/2014 07:22   Dg Chest Port 1 View  06/18/2014   CLINICAL DATA:  Evaluate endotracheal tube  EXAM: PORTABLE CHEST - 1 VIEW  COMPARISON:  Prior chest x-ray 06/17/2014  FINDINGS: The endotracheal tube is 3.2 cm above the carina. Nasogastric tube in unchanged position. Tip lies below the diaphragm presumably within the stomach. Right IJ approach central venous catheter with the tip in the mid SVC. Metallic artifact overlies the chest in the form of multiple cardiac leads.  Stable cardiac and mediastinal contours with borderline cardiomegaly. Probable small layering left pleural effusion with associated atelectasis versus infiltrate. Background changes of COPD with pulmonary hyperexpansion, central bronchitic changes and diffuse mild interstitial prominence. Minimal pulmonary vascular congestion without interstitial edema. No acute osseous abnormality. No pneumothorax.  IMPRESSION: 1. No significant interval change in the appearance of the chest over the last 24 hr. However, compared to 06/16/2014 there has been interval resolution of CHF. 2. Residual minimal pulmonary vascular congestion without edema and left basilar opacity favored to reflect a small layering effusion and atelectasis. Superimposed infiltrate difficult to exclude radiographically. 3. Stable and satisfactory support apparatus.   Electronically Signed   By: Malachy Moan M.D.   On: 06/18/2014 07:26    ASSESSMENT / PLAN:  PULMONARY A: Acute respiratory failure due to acute pulmonary edema, ?ARDS with sepsis as well from UTI. P:   -IS, ambulation goals  CARDIOVASCULAR A:  Septic shock Chest Pain  Elevated Troponin - likely demand ischemia, r/o ACS, peaked 7/21 Combined CHF PAF - not on anti-coagulation due to allergy to coumadin Hx Labile HTN Hx LBBB, Valvular Heart Disease P:  - Heparin gtt per cards, consider  dc - Cardiology following - Home plavix, isosorbide -keep tele  RENAL A:   CKD - baseline sr cr approx 2.5, stable hypokalemia P:   -chem in am -supp k -holding lasix, agree -kvo  GASTROINTESTINAL A:  diarrhea P:   -clear liquid diet, advance today -d/c ppi -cdiff, if neg , add imodium -holding empiric Treatment  HEMATOLOGIC A:  Leukocytosis P:  -CBC in am  -hep drip for ACS - consider dc  INFECTIOUS A: UTI Low clinical suspicion PNA, pct unimpressed P:   - narrow to cover klieb, ceftriaxone, stop date 26th - d/c azithro - F/U on cultures.  ENDOCRINE A:   No diabetes history. Normal cortisol  P:   - monitor cbg  NEUROLOGIC A:   Acute Encephalopathy - resolved P: Monitor for delirium Pt conuslt  TODAY'S SUMMARY: Successfully extubated, narrow antibiotics, out to floor, possible cath Friday, tele, to triad   Beverely Low, MD, MPH [facility] PGY-2 06/19/2014 8:17 AM  I have fully examined this patient and  agree with above findings.     Tiron Suski J. Tyson AliasFeMcarthur Rossettiinstein, MD, FACP Pgr: [phone] [facility]

## 2014-06-20 LAB — BASIC METABOLIC PANEL
ANION GAP: 12 (ref 5–15)
BUN: 43 mg/dL — ABNORMAL HIGH (ref 6–23)
CALCIUM: 8.5 mg/dL (ref 8.4–10.5)
CO2: 26 mEq/L (ref 19–32)
Chloride: 103 mEq/L (ref 96–112)
Creatinine, Ser: 2.22 mg/dL — ABNORMAL HIGH (ref 0.50–1.10)
GFR calc Af Amer: 23 mL/min — ABNORMAL LOW (ref >90)
GFR, EST NON AFRICAN AMERICAN: 19 mL/min — AB (ref >90)
Glucose, Bld: 92 mg/dL (ref 70–99)
POTASSIUM: 3.6 meq/L — AB (ref 3.7–5.3)
Sodium: 141 mEq/L (ref 137–147)

## 2014-06-20 LAB — CBC
HCT: 26.2 % — ABNORMAL LOW (ref 36.0–46.0)
Hemoglobin: 8.5 g/dL — ABNORMAL LOW (ref 12.0–15.0)
MCH: 29.2 pg (ref 26.0–34.0)
MCHC: 32.4 g/dL (ref 30.0–36.0)
MCV: 90 fL (ref 78.0–100.0)
PLATELETS: 317 10*3/uL (ref 150–400)
RBC: 2.91 MIL/uL — AB (ref 3.87–5.11)
RDW: 12.6 % (ref 11.5–15.5)
WBC: 7.1 10*3/uL (ref 4.0–10.5)

## 2014-06-20 MED ORDER — CIPROFLOXACIN HCL 250 MG PO TABS: 250.0000 mg | ORAL_TABLET | Freq: Two times a day (BID) | ORAL | Status: DC

## 2014-06-20 MED ORDER — CIPROFLOXACIN HCL 250 MG PO TABS
250.0000 mg | ORAL_TABLET | Freq: Two times a day (BID) | ORAL | Status: DC
  Administered 2014-06-20: 250 mg via ORAL
  Filled 2014-06-20 (×3): qty 1

## 2014-06-20 NOTE — Progress Notes (Signed)
Cardiologist: Dr. Excell Seltzerooper  Subjective:  Denies any chest pain. Feeling good. Ready to go home and come back for cath next Wednesday.   Objective:  Vital Signs in the last 24 hours: Temp:  [97.5 F (36.4 C)-98.6 F (37 C)] 97.5 F (36.4 C) (07/24 0501) Pulse Rate:  [75-108] 96 (07/24 0501) Resp:  [16-20] 18 (07/24 0501) BP: (129-150)/(57-70) 145/70 mmHg (07/24 0501) SpO2:  [100 %] 100 % (07/24 0501) Weight:  [121 lb 6.4 oz (55.067 kg)-122 lb 14.4 oz (55.747 kg)] 122 lb 14.4 oz (55.747 kg) (07/24 0501)  Intake/Output from previous day: 07/23 0701 - 07/24 0700 In: 296.8 [P.O.:240; I.V.:56.8] Out: 100 [Urine:100]   Physical Exam: General: Well developed, well nourished, in no acute distress.elderly.  Head:  Normocephalic and atraumatic. Lungs: Clear to auscultation and percussion. Heart: Normal S1 and S2.  No murmur, rubs or gallops.  Abdomen: soft, non-tender, positive bowel sounds. Extremities: No clubbing or cyanosis. No edema. Neurologic: Alert and oriented x 3.    Lab Results:  Recent Labs  06/19/14 0525 06/20/14 0330  WBC 8.4 7.1  HGB 8.8* 8.5*  PLT 308 317    Recent Labs  06/19/14 0525 06/20/14 0330  NA 143 141  K 3.3* 3.6*  CL 102 103  CO2 26 26  GLUCOSE 99 92  BUN 50* 43*  CREATININE 2.38* 2.22*    Recent Labs  06/18/14 0800  TROPONINI 5.58*   Hepatic Function Panel No results found for this basename: PROT, ALBUMIN, AST, ALT, ALKPHOS, BILITOT, BILIDIR, IBILI,  in the last 72 hours Imaging: CXR - resolution of CHF.   EKG:  06/17/14-left bundle branch block, decreased ST segment deviation in lateral leads when compared to prior  Cardiac Studies:  Echocardiogram -EF 50%, improved from 40% on prior. Mild AR, Mod TR   . aspirin  81 mg Oral Daily  . B-complex with vitamin C  1 tablet Oral Daily  . cefTRIAXone (ROCEPHIN)  IV  1 g Intravenous Q24H  . clopidogrel  75 mg Per Tube Daily  . heparin subcutaneous  5,000 Units Subcutaneous 3 times per  day  . isosorbide mononitrate  30 mg Oral Daily  . sodium chloride  3 mL Intravenous Q12H  . sodium chloride  3 mL Intravenous Q12H   Assessment/Plan:  Principal Problem:   Acute respiratory failure Active Problems:   Chest pain   Acute on chronic combined systolic and diastolic CHF, NYHA class 3   CKD (chronic kidney disease), stage IV   Acute pulmonary edema   UTI (lower urinary tract infection)   Septic shock   Acute exacerbation of CHF (congestive heart failure)   ACS (abdominal compartment syndrome)   Non-STEMI (non-ST elevated myocardial infarction)  80 year-old with non-ST elevation myocardial infarction in the setting of acute respiratory failure with acute on chronic systolic heart failure, chronic kidney disease stage IV.  NSTEMI-  Trop 5.58. Echocardiogram actually reassuring with EF 50%. Previously was moderately reduced at approximately 40%.  -- After a long discussion with Dr. Excell Seltzerooper it was decided to proceed with a limited contrast cath study to determine if revascularization might be feasible. It was thought best to let her recover from her current illness and have cardiac cath next week. It is planned for Wednesday  at 08:30am. She will be admitted the night before for cath for prehydration as risk of cardiac catheterization/worsening acute kidney injury is high.  I have sent Dr. Earmon Phoenixooper's nurse a staff message about setting up admission. --  No beta blocker because of previous allergic reaction. No statin because of previous allergy.  -- Isosorbide 30mg  QD. Agree with Plavix, ASA. -- She is feeling well today and can go home after she gets up and walks around  Acute respiratory failure-resolved  Chronic kidney disease stage II  Chronic systolic heart failure-previous ejection fraction 40% but currently 50%, reassuring.  No beta blocker because of prior allergy. No ACE inhibitor because of renal disease.   She clarified that she does want DNR status. She talked it over  with her children as well. She is fine at age 14 if she passes away she stat es and she wants to die peacefully. Dr Anne Fu has written a DNR order.     Thereasa Parkin  PA-C 06/20/2014, 10:45 AM     Patient seen and examined and history reviewed. Agree with above findings and plan. Plan to ambulate this am. If stable Ok to DC home today and we will make arrangements for patient to be admitted on Tuesday pm for hydration for cardiac cath on Wednesday.  Peter [place], MDFACC 06/20/2014 11:01 AM

## 2014-06-20 NOTE — Progress Notes (Signed)
Pt HR remains between 70-80 on telemetry. Pt resting comfortably.  pt with non-sustained run of wide QRS with bundle branch block up to 170 BPM after 10PM. Pt asymptomatic.

## 2014-06-20 NOTE — Discharge Summary (Signed)
Physician Discharge Summary  [name] ZOX:[id number] DOB: 1930/03/01 DOA: 06/16/2014  PCP: [name], MD  Admit date: 06/16/2014 Discharge date: 06/20/2014  Recommendations for Outpatient Follow-up:  1. Pt will need to follow up with PCP in 2 weeks post discharge 2. Please obtain BMP 06/24/14 3. Patient will return to [facility] on 06/24/2014 per cardiology instructions in preparation for heart catheterization on 06/25/2014   Discharge Diagnoses:  NSTEMI/CAD  -Patient was initially placed on a heparin drip which has been discontinued  -Continue Imdur  -The patient has previous allergic reaction to metoprolol but was taking bisoprolol at home which will be restarted -The patient is not on statin because of previous allergy  -Continue isosorbide  -06/17/2014 echocardiogram EF 50-55%, grade 1 diastolic dysfunction, no WMA  -Case was discussed with cardiology, Dr. [place] on day of d/c whom cleared pt for d/c  -pt to return to [facility] on 06/24/14 for IVF in preparation for heart cath on 06/25/14--this has been set up by the cardiology service whom will contact the patient regarding exact timing  -continue ASA/plavix  -hold furosemide until cleared by cardiology after heart cath Acute respiratory failure  -The patient was intubated from 06/16/2014-06/18/2014  -She is clinically stable on room air  -Ambulatory pulse oximetry did not show any desaturation  -resolved  UTI  -Urine culture shows Klebsiella pneumoniae  -Patient will be discharged on ciprofloxacin for 3 additional days which will complete 7 days of therapy  -Blood cultures negative to date  CKD stage IV  -Serum creatinine 2.22 on day of d/c  -Baseline creatinine 2.1-2.5  Anemia of chronic disease/anemia of CKD  -Hemoglobin remained stable  -No signs of active bleeding  Chronic systolic and diastolic CHF  -06/17/2014 EF 50-55%, grade 1 diastolic dysfunction  -Continue isosorbide and  bisoprolol -Patient allergic to metoprolol HTN -restart bisoprolol and hydralazine  Discharge Condition: stable  Disposition: home Follow-up Information   Follow up with Tonny Bollman, MD. (The office will call you about when to show up to the hospital on Tuesday 06/24/14 for fluids the night before your cath. If you do not hear from Julieta Gutting by monday night, please call the office to clarify)    Specialty:  Cardiology   Contact information:   [address] [place] [address] [phone]       Diet:heart healthy Wt Readings from Last 3 Encounters:  06/20/14 55.747 kg (122 lb 14.4 oz)  05/27/14 57.661 kg (127 lb 1.9 oz)  04/29/14 57.661 kg (127 lb 1.9 oz)    History of present illness:  80 year old female with a history of coronary artery disease/NSTEMI (May 2014), chronic systolic CHF, paroxysmal atrial fibrillation, CKD stage IV, and hypertension presented to the emergency department with worsening chest discomfort and shortness of breath. The patient developed acute respiratory failure secondary to pulmonary edema and possibly ARDS. The patient was intubated and transferred to the ICU. The patient's troponins were noted to be elevated and cardiology was consulted. The patient was treated for NSTEMI and started on a heparin drip. Heparin drip was discontinued on July 20 13,015 and the patient was started on Plavix. The patient was started on intravenous antibiotics for a UTI of which there was concern that the patient developed ARDS. The patient's antibiotic coverage was narrowed, and the patient was ultimately extubated on 06/18/2014. Because of the patient's CKD, comorbidities, and echocardiogram with EF 50%, it was decided to defer cardiac catheterization and continue with medical/noninvasive management. The  patient was transferred out of ICU on 06/19/14.    Consultants: CCM Cardiology  Discharge Exam: Filed Vitals:   06/20/14 0501  BP: 145/70  Pulse:  96  Temp: 97.5 F (36.4 C)  Resp: 18   Filed Vitals:   06/19/14 2335 06/20/14 0145 06/20/14 0209 06/20/14 0501  BP:   145/69 145/70  Pulse: 87  88 96  Temp:   98.6 F (37 C) 97.5 F (36.4 C)  TempSrc:   Oral Oral  Resp:  16 18 18   Height:      Weight:    55.747 kg (122 lb 14.4 oz)  SpO2:   100% 100%   General: A&O x 3, NAD, pleasant, cooperative Cardiovascular: RRR, no rub, no gallop, no S3 Respiratory: CTAB, no wheeze, no rhonchi Abdomen:soft, nontender, nondistended, positive bowel sounds Extremities: No edema, No lymphangitis, no petechiae  Discharge Instructions      Discharge Instructions   Diet - low sodium heart healthy    Complete by:  As directed      Discharge instructions    Complete by:  As directed   Do NOT restart Lasix (furosemide) until instructed by your heart doctor after the heart catheterization     Increase activity slowly    Complete by:  As directed             Medication List    STOP taking these medications       furosemide 40 MG tablet  Commonly known as:  LASIX      TAKE these medications       aspirin EC 81 MG tablet  Take 1 tablet (81 mg total) by mouth daily.     b complex vitamins tablet  Take 1 tablet by mouth daily.     bisoprolol 5 MG tablet  Commonly known as:  ZEBETA  Take 5 mg by mouth daily.     ciprofloxacin 250 MG tablet  Commonly known as:  CIPRO  Take 1 tablet (250 mg total) by mouth 2 (two) times daily.     clopidogrel 75 MG tablet  Commonly known as:  PLAVIX  Take 1 tablet (75 mg total) by mouth daily with breakfast.     fish oil-omega-3 fatty acids 1000 MG capsule  Take 1 g by mouth 2 (two) times daily.     guaifenesin 100 MG/5ML syrup  Commonly known as:  ROBITUSSIN  Take 100 mg by mouth 3 (three) times daily as needed for cough.     hydrALAZINE 25 MG tablet  Commonly known as:  APRESOLINE  Take 1 tablet (25 mg total) by mouth 2 (two) times daily.     isosorbide mononitrate 60 MG 24 hr tablet   Commonly known as:  IMDUR  Take 1 tablet (60 mg total) by mouth daily.     loratadine 10 MG tablet  Commonly known as:  CLARITIN  Take 1 tablet (10 mg total) by mouth every other day as needed for allergies (Take in AM (on days when taking)).     nitroGLYCERIN 0.4 MG SL tablet  Commonly known as:  NITROSTAT  Place 1 tablet (0.4 mg total) under the tongue every 5 (five) minutes x 3 doses as needed for chest pain.     Vitamin D-3 5000 UNITS Tabs  Take 5,000 Units by mouth daily.         The results of significant diagnostics from this hospitalization (including imaging, microbiology, ancillary and laboratory) are listed below for reference.  Significant Diagnostic Studies: Dg Chest 2 View  06/16/2014   CLINICAL DATA:  Cough, nausea  EXAM: CHEST  2 VIEW  COMPARISON:  04/10/2013  FINDINGS: The aorta is unfolded and ectatic. Mild moderate enlargement of the cardiac silhouette is noted. Central vascular congestion is identified with peripheral Kerley B-lines compatible with interstitial edema. Trace effusions are noted. Hyperinflation suggests emphysema. Atheromatous aortic calcification noted without calcified aneurysm.  IMPRESSION: Cardiomegaly with mild interstitial pulmonary edema and trace pleural effusions.   Electronically Signed   By: Christiana Pellant M.D.   On: 06/16/2014 08:27   Dg Chest Port 1 View  06/19/2014   CLINICAL DATA:  Extubation.  EXAM: PORTABLE CHEST - 1 VIEW  COMPARISON:  05/29/2014.  FINDINGS: Right IJ line in stable position. Endotracheal tube has been removed. NG tube has been removed. Bibasilar atelectasis and left pleural effusion noted. Left lower lobe infiltrate cannot be excluded. No pneumothorax. Stable cardiomegaly. No acute osseus abnormality.  IMPRESSION: 1. Interim extubation and removal of NG tube. 2. Bibasilar atelectasis and left pleural effusion. Left lower lobe infiltrate cannot be excluded.   Electronically Signed   By: Maisie Fus  Register   On: 06/19/2014  07:22   Dg Chest Port 1 View  06/18/2014   CLINICAL DATA:  Evaluate endotracheal tube  EXAM: PORTABLE CHEST - 1 VIEW  COMPARISON:  Prior chest x-ray 06/17/2014  FINDINGS: The endotracheal tube is 3.2 cm above the carina. Nasogastric tube in unchanged position. Tip lies below the diaphragm presumably within the stomach. Right IJ approach central venous catheter with the tip in the mid SVC. Metallic artifact overlies the chest in the form of multiple cardiac leads.  Stable cardiac and mediastinal contours with borderline cardiomegaly. Probable small layering left pleural effusion with associated atelectasis versus infiltrate. Background changes of COPD with pulmonary hyperexpansion, central bronchitic changes and diffuse mild interstitial prominence. Minimal pulmonary vascular congestion without interstitial edema. No acute osseous abnormality. No pneumothorax.  IMPRESSION: 1. No significant interval change in the appearance of the chest over the last 24 hr. However, compared to 06/16/2014 there has been interval resolution of CHF. 2. Residual minimal pulmonary vascular congestion without edema and left basilar opacity favored to reflect a small layering effusion and atelectasis. Superimposed infiltrate difficult to exclude radiographically. 3. Stable and satisfactory support apparatus.   Electronically Signed   By: Malachy Moan M.D.   On: 06/18/2014 07:26   Dg Chest Port 1 View  06/17/2014   CLINICAL DATA:  Shortness of breath.  EXAM: PORTABLE CHEST - 1 VIEW  COMPARISON:  06/16/2014.  FINDINGS: Endotracheal tube, NG tube, right IJ line in stable position. Stable bibasilar atelectasis and/or infiltrates. No pleural effusion or pneumothorax. Heart size and pulmonary vascularity stable. No acute osseous abnormality.  IMPRESSION: 1. Stable line and tube positions. 2. Stable bibasilar atelectasis and/or infiltrates.   Electronically Signed   By: Maisie Fus  Register   On: 06/17/2014 07:14   Dg Chest Port 1  View  06/16/2014   CLINICAL DATA:  EvaluateRUTLC  EXAM: PORTABLE CHEST - 1 VIEW  COMPARISON:  Chest radiograph 06/16/2014  FINDINGS: Interval intubation with endotracheal tube terminating within the mid trachea. Enteric tube courses inferior to the diaphragm, tip not included on this examination. Right IJ central venous catheter tip projects at the superior cavoatrial junction.  Stable cardiac and mediastinal contours. Slight interval improvement in bilateral interstitial pulmonary opacities. Persistent retrocardiac consolidative opacity. No definite pleural effusion or pneumothorax.  IMPRESSION: ET tube terminates in the mid trachea.  Enteric tube courses inferior to the diaphragm, tip not included on this examination.  Right IJ central venous catheter tip projects at the superior cavoatrial junction. No definite right-sided pneumothorax.  Persistent consolidation in the retrocardiac region may represent atelectasis or infection.  Slight improvement parahilar interstitial opacities, potentially improving pulmonary edema.   Electronically Signed   By: Annia Belt M.D.   On: 06/16/2014 13:49   Dg Chest Portable 1 View  06/16/2014   CLINICAL DATA:  Chest pain, shortness of breath.  EXAM: PORTABLE CHEST - 1 VIEW  COMPARISON:  Chest radiograph 06/16/2014.  FINDINGS: Stable cardiac and mediastinal contours. Interval increase in bilateral interstitial pulmonary opacities. Retrocardiac consolidation with obscuration of the left hemidiaphragm. No definite pleural effusion or pneumothorax.  IMPRESSION: Interval development of bilateral interstitial pulmonary opacities, suggestive of congestive heart failure.  Retrocardiac consolidative opacity favored to represent atelectasis. Infection not excluded.   Electronically Signed   By: Annia Belt M.D.   On: 06/16/2014 12:33   Dg Abd Portable 1v  06/16/2014   CLINICAL DATA:  OG tube placement.  EXAM: PORTABLE ABDOMEN - 1 VIEW  COMPARISON:  CT abdomen and pelvis 10/11/2003.   FINDINGS: OG tube is in place with the tip in the body of the stomach. Left basilar airspace disease is noted.  IMPRESSION: NG tube tip is in the stomach.  Left basilar airspace disease.   Electronically Signed   By: Drusilla Kanner M.D.   On: 06/16/2014 15:58     Microbiology: Recent Results (from the past 240 hour(s))  URINE CULTURE     Status: None   Collection Time    06/16/14  9:15 AM      Result Value Ref Range Status   Specimen Description URINE, CLEAN CATCH   Final   Special Requests NONE   Final   Culture  Setup Time     Final   Value: 06/16/2014 09:35     Performed at [facility] Count     Final   Value: >=100,000 COLONIES/ML     Performed at [facility]   Culture     Final   Value: KLEBSIELLA PNEUMONIAE     Performed at [facility]   Report Status 06/18/2014 FINAL   Final   Organism ID, Bacteria KLEBSIELLA PNEUMONIAE   Final  CULTURE, BLOOD (ROUTINE X 2)     Status: None   Collection Time    06/16/14 12:38 PM      Result Value Ref Range Status   Specimen Description BLOOD LEFT HAND   Final   Special Requests     Final   Value: BOTTLES DRAWN AEROBIC AND ANAEROBIC BLUE 6CC RED 5CC   Culture  Setup Time     Final   Value: 06/16/2014 19:36     Performed at [facility]   Culture     Final   Value:        BLOOD CULTURE RECEIVED NO GROWTH TO DATE CULTURE WILL BE HELD FOR 5 DAYS BEFORE ISSUING A FINAL NEGATIVE REPORT     Performed at [facility]   Report Status PENDING   Incomplete  CULTURE, BLOOD (ROUTINE X 2)     Status: None   Collection Time    06/16/14 12:43 PM      Result Value Ref Range Status   Specimen Description BLOOD RIGHT HAND   Final   Special Requests BOTTLES DRAWN AEROBIC AND ANAEROBIC 6CC   Final  Culture  Setup Time     Final   Value: 06/16/2014 19:36     [facility]   Culture     Final   Value:        BLOOD CULTURE RECEIVED NO GROWTH TO DATE CULTURE WILL BE HELD FOR 5  DAYS BEFORE ISSUING A FINAL NEGATIVE REPORT     [facility]   Report Status PENDING   Incomplete  MRSA PCR SCREENING     Status: None   Collection Time    06/16/14  2:28 PM      Result Value Ref Range Status   MRSA by PCR NEGATIVE  NEGATIVE Final   Comment:            The GeneXpert MRSA Assay (FDA     approved for NASAL specimens     only), is one component of a     comprehensive MRSA colonization     surveillance program. It is not     intended to diagnose MRSA     infection nor to guide or     monitor treatment for     MRSA infections.  CLOSTRIDIUM DIFFICILE BY PCR     Status: None   Collection Time    06/19/14 10:38 AM      Result Value Ref Range Status   C difficile by pcr NEGATIVE  NEGATIVE Final     Labs: Basic Metabolic Panel:  Recent Labs Lab 06/16/14 2030 06/17/14 0445 06/17/14 1836 06/18/14 0520 06/18/14 1235 06/19/14 0525 06/20/14 0330  NA 133* 134*  --  140  --  143 141  K 4.0 4.2  --  3.0*  --  3.3* 3.6*  CL 99 99  --  99  --  102 103  CO2 21 22  --  25  --  26 26  GLUCOSE 125* 112*  --  117*  --  99 92  BUN 38* 40*  --  51*  --  50* 43*  CREATININE 2.16* 2.37*  --  2.42*  --  2.38* 2.22*  CALCIUM 7.9* 8.3*  --  8.2*  --  8.6 8.5  MG  --  2.0 2.0 2.0 2.0  --   --   PHOS  --  3.6 4.1 3.0 2.3  --   --    Liver Function Tests:  Recent Labs Lab 06/16/14 0751  AST 19  ALT 10  ALKPHOS 66  BILITOT 0.2*  PROT 7.1  ALBUMIN 3.4*   No results found for this basename: LIPASE, AMYLASE,  in the last 168 hours No results found for this basename: AMMONIA,  in the last 168 hours CBC:  Recent Labs Lab 06/16/14 0751 06/17/14 0445 06/18/14 0520 06/19/14 0525 06/20/14 0330  WBC 7.8 12.3* 12.0* 8.4 7.1  NEUTROABS 6.3  --  9.5* 5.8  --   HGB 9.4* 9.0* 8.7* 8.8* 8.5*  HCT 28.7* 27.3* 26.0* 27.2* 26.2*  MCV 90.3 90.7 89.0 89.5 90.0  PLT 297 287 311 308 317   Cardiac Enzymes:  Recent Labs Lab 06/16/14 1454 06/16/14 2054  06/17/14 0345 06/18/14 0800  TROPONINI 3.80* 8.25* 10.38* 5.58*   BNP: No components found with this basename: POCBNP,  CBG:  Recent Labs Lab 06/18/14 0341 06/18/14 0745 06/18/14 1245 06/18/14 1538 06/18/14 1903  GLUCAP 117* 124* 112* 129* 167*    Time coordinating discharge:  Greater than 30 minutes  Signed:  Neilson Oehlert, DO [facility] Pager: [phone] 06/20/2014,  12:55 PM

## 2014-06-22 LAB — CULTURE, BLOOD (ROUTINE X 2)
Culture: NO GROWTH
Culture: NO GROWTH

## 2014-06-24 ENCOUNTER — Encounter (HOSPITAL_COMMUNITY): Payer: Self-pay | Admitting: General Practice

## 2014-06-24 ENCOUNTER — Observation Stay (HOSPITAL_COMMUNITY)
Admission: RE | Admit: 2014-06-24 | Discharge: 2014-06-26 | Disposition: A | Discharge status: Home / Self Care | Payer: Medicare Other | Source: Ambulatory Visit | Attending: Cardiovascular Disease | Admitting: Cardiovascular Disease

## 2014-06-24 DIAGNOSIS — I10 Essential (primary) hypertension: Secondary | ICD-10-CM | POA: Diagnosis present

## 2014-06-24 DIAGNOSIS — E785 Hyperlipidemia, unspecified: Secondary | ICD-10-CM | POA: Insufficient documentation

## 2014-06-24 DIAGNOSIS — I080 Rheumatic disorders of both mitral and aortic valves: Secondary | ICD-10-CM | POA: Insufficient documentation

## 2014-06-24 DIAGNOSIS — Z7902 Long term (current) use of antithrombotics/antiplatelets: Secondary | ICD-10-CM | POA: Diagnosis not present

## 2014-06-24 DIAGNOSIS — I209 Angina pectoris, unspecified: Secondary | ICD-10-CM

## 2014-06-24 DIAGNOSIS — I129 Hypertensive chronic kidney disease with stage 1 through stage 4 chronic kidney disease, or unspecified chronic kidney disease: Secondary | ICD-10-CM | POA: Diagnosis not present

## 2014-06-24 DIAGNOSIS — I2510 Atherosclerotic heart disease of native coronary artery without angina pectoris: Principal | ICD-10-CM | POA: Insufficient documentation

## 2014-06-24 DIAGNOSIS — Z66 Do not resuscitate: Secondary | ICD-10-CM | POA: Insufficient documentation

## 2014-06-24 DIAGNOSIS — I509 Heart failure, unspecified: Secondary | ICD-10-CM | POA: Diagnosis not present

## 2014-06-24 DIAGNOSIS — I447 Left bundle-branch block, unspecified: Secondary | ICD-10-CM | POA: Diagnosis not present

## 2014-06-24 DIAGNOSIS — I214 Non-ST elevation (NSTEMI) myocardial infarction: Secondary | ICD-10-CM | POA: Insufficient documentation

## 2014-06-24 DIAGNOSIS — I200 Unstable angina: Secondary | ICD-10-CM | POA: Diagnosis present

## 2014-06-24 DIAGNOSIS — I5042 Chronic combined systolic (congestive) and diastolic (congestive) heart failure: Secondary | ICD-10-CM | POA: Insufficient documentation

## 2014-06-24 DIAGNOSIS — F411 Generalized anxiety disorder: Secondary | ICD-10-CM | POA: Insufficient documentation

## 2014-06-24 DIAGNOSIS — Z7982 Long term (current) use of aspirin: Secondary | ICD-10-CM | POA: Diagnosis not present

## 2014-06-24 DIAGNOSIS — Z9861 Coronary angioplasty status: Secondary | ICD-10-CM | POA: Diagnosis not present

## 2014-06-24 DIAGNOSIS — I25118 Atherosclerotic heart disease of native coronary artery with other forms of angina pectoris: Secondary | ICD-10-CM

## 2014-06-24 DIAGNOSIS — I359 Nonrheumatic aortic valve disorder, unspecified: Secondary | ICD-10-CM

## 2014-06-24 DIAGNOSIS — Z87891 Personal history of nicotine dependence: Secondary | ICD-10-CM | POA: Insufficient documentation

## 2014-06-24 DIAGNOSIS — I2582 Chronic total occlusion of coronary artery: Secondary | ICD-10-CM | POA: Insufficient documentation

## 2014-06-24 DIAGNOSIS — I079 Rheumatic tricuspid valve disease, unspecified: Secondary | ICD-10-CM | POA: Insufficient documentation

## 2014-06-24 DIAGNOSIS — I4891 Unspecified atrial fibrillation: Secondary | ICD-10-CM | POA: Insufficient documentation

## 2014-06-24 DIAGNOSIS — N184 Chronic kidney disease, stage 4 (severe): Secondary | ICD-10-CM | POA: Insufficient documentation

## 2014-06-24 HISTORY — DX: Essential (primary) hypertension: I10

## 2014-06-24 HISTORY — DX: Hyperlipidemia, unspecified: E78.5

## 2014-06-24 LAB — BASIC METABOLIC PANEL
Anion gap: 15 (ref 5–15)
BUN: 30 mg/dL — ABNORMAL HIGH (ref 6–23)
CHLORIDE: 101 meq/L (ref 96–112)
CO2: 22 mEq/L (ref 19–32)
Calcium: 9.3 mg/dL (ref 8.4–10.5)
Creatinine, Ser: 2.34 mg/dL — ABNORMAL HIGH (ref 0.50–1.10)
GFR calc Af Amer: 21 mL/min — ABNORMAL LOW (ref >90)
GFR calc non Af Amer: 18 mL/min — ABNORMAL LOW (ref >90)
GLUCOSE: 118 mg/dL — AB (ref 70–99)
POTASSIUM: 5 meq/L (ref 3.7–5.3)
Sodium: 138 mEq/L (ref 137–147)

## 2014-06-24 LAB — CBC
HCT: 25.9 % — ABNORMAL LOW (ref 36.0–46.0)
Hemoglobin: 8.5 g/dL — ABNORMAL LOW (ref 12.0–15.0)
MCH: 29.9 pg (ref 26.0–34.0)
MCHC: 32.8 g/dL (ref 30.0–36.0)
MCV: 91.2 fL (ref 78.0–100.0)
Platelets: 333 10*3/uL (ref 150–400)
RBC: 2.84 MIL/uL — AB (ref 3.87–5.11)
RDW: 13 % (ref 11.5–15.5)
WBC: 8.7 10*3/uL (ref 4.0–10.5)

## 2014-06-24 MED ORDER — NITROGLYCERIN 0.4 MG SL SUBL: 0.4000 mg | SUBLINGUAL_TABLET | SUBLINGUAL | Status: DC | PRN

## 2014-06-24 MED ORDER — BISOPROLOL FUMARATE 5 MG PO TABS
5.0000 mg | ORAL_TABLET | Freq: Every day | ORAL | Status: DC
  Administered 2014-06-25 – 2014-06-26 (×2): 5 mg via ORAL
  Filled 2014-06-24 (×3): qty 1

## 2014-06-24 MED ORDER — CIPROFLOXACIN HCL 250 MG PO TABS
250.0000 mg | ORAL_TABLET | Freq: Two times a day (BID) | ORAL | Status: DC
  Administered 2014-06-25 (×2): 250 mg via ORAL
  Filled 2014-06-24 (×6): qty 1

## 2014-06-24 MED ORDER — ISOSORBIDE MONONITRATE ER 60 MG PO TB24
60.0000 mg | ORAL_TABLET | Freq: Every day | ORAL | Status: DC
  Administered 2014-06-25 – 2014-06-26 (×2): 60 mg via ORAL
  Filled 2014-06-24 (×2): qty 1

## 2014-06-24 MED ORDER — ASPIRIN EC 81 MG PO TBEC
81.0000 mg | DELAYED_RELEASE_TABLET | Freq: Every day | ORAL | Status: DC
  Administered 2014-06-25 – 2014-06-26 (×2): 81 mg via ORAL
  Filled 2014-06-24 (×3): qty 1

## 2014-06-24 MED ORDER — SODIUM CHLORIDE 0.9 % IV SOLN
INTRAVENOUS | Status: DC
  Administered 2014-06-25: 04:00:00 via INTRAVENOUS

## 2014-06-24 MED ORDER — ASPIRIN 81 MG PO CHEW
81.0000 mg | CHEWABLE_TABLET | ORAL | Status: AC
  Administered 2014-06-25: 81 mg via ORAL
  Filled 2014-06-24: qty 1

## 2014-06-24 MED ORDER — HYDRALAZINE HCL 25 MG PO TABS
25.0000 mg | ORAL_TABLET | Freq: Two times a day (BID) | ORAL | Status: DC
  Administered 2014-06-25 – 2014-06-26 (×3): 25 mg via ORAL
  Filled 2014-06-24 (×5): qty 1

## 2014-06-24 MED ORDER — SODIUM CHLORIDE 0.9 % IJ SOLN: 3.0000 mL | INTRAMUSCULAR | Status: DC | PRN

## 2014-06-24 MED ORDER — SODIUM CHLORIDE 0.9 % IJ SOLN
3.0000 mL | Freq: Two times a day (BID) | INTRAMUSCULAR | Status: DC
  Administered 2014-06-24: 3 mL via INTRAVENOUS

## 2014-06-24 MED ORDER — FUROSEMIDE 10 MG/ML IJ SOLN
80.0000 mg | Freq: Once | INTRAMUSCULAR | Status: AC
  Administered 2014-06-24: 80 mg via INTRAVENOUS
  Filled 2014-06-24: qty 8

## 2014-06-24 MED ORDER — CLOPIDOGREL BISULFATE 75 MG PO TABS
75.0000 mg | ORAL_TABLET | Freq: Every day | ORAL | Status: DC
  Administered 2014-06-25 – 2014-06-26 (×2): 75 mg via ORAL
  Filled 2014-06-24: qty 1

## 2014-06-24 MED ORDER — SODIUM CHLORIDE 0.9 % IV SOLN: 250.0000 mL | INTRAVENOUS | Status: DC | PRN

## 2014-06-24 NOTE — H&P (Addendum)
[name] is an 80 y.o. female.   Chief Complaint:  Coronary angiography HPI:   80 year old female with a history of coronary artery disease s/p DES LCX 7/13, chronic systolic CHF, paroxysmal atrial fibrillation, CKD stage IV, and hypertension presented to the emergency department on 7/20 with worsening chest discomfort and shortness of breath. The patient developed acute respiratory failure secondary to pulmonary edema and possibly ARDS. The patient was intubated and transferred to the ICU. The patient's troponins  were noted to be elevated (peaked 10.4) and cardiology was consulted. The patient was treated for NSTEMI and started on a heparin drip. Heparin drip was discontinued on July 20 13,015 and the patient was started on Plavix. The patient was started on intravenous antibiotics for a UTI of which there was concern that the patient developed ARDS. The patient's antibiotic coverage was narrowed, and the patient was ultimately extubated on 06/18/2014. Because of the patient's CKD, comorbidities, and echocardiogram with EF 50%, it was decided to defer cardiac catheterization and continue with medical/noninvasive management. Cr was at baseline 2.2 on d/c 7/24  She presents today for pre-cath hydration.  She reports DOE.  Her lasix was stopped on discharge and she says she has been gaining fluid but has not weighed herself. She is getting stronger and today has been her best day since discharge.  She denies nausea, vomiting, fever, chest pain, orthopnea, dizziness, PND, cough, congestion, abdominal pain, hematochezia, melena. + mild lower extremity edema  Last cath 7/13: Left mainstem: normal Left anterior descending (LAD):  The ostium of the LAD has 20-30% stenosis. The mid vessel has 50% stenosis. The distal vessel as it wraps around the apex is widely patent there were 2 diagonal branches arising from the LAD. The first is an 80% ostial stenosis and this is a very small diagonal. The  second diagonal is of moderate caliber and is patent throughout.  Left circumflex (LCx): Supplies a large first obtuse marginal branch that then divides into twin branches. The proximal aspect of the marginal branch has a 95% stenosis present. This lesion is very focal in nature. The AV groove circumflex beyond the marginal branch is small. As the mid AV groove circumflex arises from the marginal there is a 70% ostial stenosis. It then gives off 2 small branch vessels, the first of which has a 95% stenosis in the second of which is widely patent. Both of these vessels are less than 2 mm in diameter.  Right coronary artery (RCA): The right coronary cusp is calcified. The RCA is patent throughout. There are diffuse irregularities and mild calcification. There are no high-grade stenoses present. The vessel gives off a single PDA and a single posterolateral branch.  Complicated PCI of OM-1 with 3.5x12 mm Xience stent      Past Medical History  Diagnosis Date  . Lower extremity edema     bilateral  . CKD (chronic kidney disease), stage IV     a. baseline creat previously 1.6->2 - elevated during 04-06-13 adm.  . Labile hypertension   . Anxiety   . Failure to thrive in childhood   . History of shingles July 2004    on the left side of her thorax and postherpetic neuralgia  . History of psoriasis   . History of recurrent UTIs   . Major depressive disorder, single episode, severe 04/07/03    "when my husband died"  . Valvular heart disease     a.  11/2012 Echo: Mild AS/AI/MR;  b. 02/2013 Echo: EF 55-60%, mildly dil LA, calcified AV/MV.  Marland Kitchen CAD (coronary artery disease)     a. NSTEMI 05/2012 s/p DES to OM (residual severe LCx subbranch dz for med rx unless refractory angina recurs) b. NSTEMI 03/2013 felt 2/2 demand in setting of resp failure, for med rx.  Marland Kitchen LBBB (left bundle branch block)     intermittent  . Chronic combined systolic and diastolic CHF (congestive heart failure)     a. 05/2012 Echo: EF  60-65%;  b. 07/2012 Echo in setting of afib: EF 35%;  c. 02/2013 Echo: EF 55-60%, mildly dil LA.  Marland Kitchen PAF (paroxysmal atrial fibrillation)     a. Dx 07/2012 -> Amio started but later dc'd 2/2 nausea/NSR. b. 02/2013 Coumadin initiated, dc'd 03/2013 2/2 allergic reaction. c. Ultimately not on NOAC due to age, Coumadin allergy and low CrCl.  Marland Kitchen Respiratory failure     a. Requiring intubation 03/2013, secondary to cardiogenic pulmonary edema.  . Myocardial infarction 07/2013; 06/16/2014 X 2    "mild; mild X 2"    Past Surgical History  Procedure Laterality Date  . Salpingoophorectomy      "cyst removed"  . Appendectomy    . Cataract extraction, bilateral Bilateral ~ 2010  . Coronary angioplasty with stent placement  ~ 07/2012    "1"  . Abdominal hysterectomy      "partial"  . Breast cyst excision Bilateral     "total of 4-5"    Family History  Problem Relation Age of Onset  . Diabetes    . Cancer    . Lung cancer     Social History:  reports that she quit smoking about 34 years ago. Her smoking use included Cigarettes. She has a 45 pack-year smoking history. She has never used smokeless tobacco. She reports that she drinks alcohol. She reports that she does not use illicit drugs.  Allergies:  Allergies  Allergen Reactions  . Metoprolol Swelling    Takes bisoprolol without issue  . Norvasc [Amlodipine Besylate] Swelling    Takes nisoldipine without issue  . Sulfonamide Derivatives Hives, Itching and Swelling  . Amiodarone Nausea And Vomiting  . Medrol [Methylprednisolone]     Fluid retention/CHF exacerbation - caution with steroids in future if ever needed  . Statins     Myalgias - "I won't take them"  . Coumadin [Warfarin Sodium] Rash  . Latex Itching    Medications Prior to Admission  Medication Sig Dispense Refill  . aspirin EC 81 MG tablet Take 1 tablet (81 mg total) by mouth daily.      Marland Kitchen b complex vitamins tablet Take 1 tablet by mouth daily.        . bisoprolol (ZEBETA) 5 MG  tablet Take 5 mg by mouth daily.      . Cholecalciferol (VITAMIN D-3) 5000 UNITS TABS Take 5,000 Units by mouth daily.       . ciprofloxacin (CIPRO) 250 MG tablet Take 1 tablet (250 mg total) by mouth 2 (two) times daily.  6 tablet  0  . clopidogrel (PLAVIX) 75 MG tablet Take 1 tablet (75 mg total) by mouth daily with breakfast.  30 tablet  6  . fish oil-omega-3 fatty acids 1000 MG capsule Take 1 g by mouth 2 (two) times daily.       Marland Kitchen guaifenesin (ROBITUSSIN) 100 MG/5ML syrup Take 100 mg by mouth 3 (three) times daily as needed for cough.      . hydrALAZINE (APRESOLINE) 25 MG tablet  Take 1 tablet (25 mg total) by mouth 2 (two) times daily.  60 tablet  3  . isosorbide mononitrate (IMDUR) 60 MG 24 hr tablet Take 1 tablet (60 mg total) by mouth daily.  30 tablet  3  . loratadine (CLARITIN) 10 MG tablet Take 1 tablet (10 mg total) by mouth every other day as needed for allergies (Take in AM (on days when taking)).      . nitroGLYCERIN (NITROSTAT) 0.4 MG SL tablet Place 1 tablet (0.4 mg total) under the tongue every 5 (five) minutes x 3 doses as needed for chest pain.  25 tablet  4    No results found for this or any previous visit (from the past 48 hour(s)). No results found.  Review of Systems  All other systems reviewed and are negative.  +DOE  The patient currently denies nausea, vomiting, fever, chest pain, shortness of breath, orthopnea, dizziness, PND, cough, congestion, abdominal pain, hematochezia, melena, lower extremity edema  Blood pressure 129/53, pulse 69, temperature 97.9 F (36.6 C), temperature source Oral, resp. rate 18, SpO2 99.00%.   Physical Exam  Nursing note and vitals reviewed. Constitutional: She is oriented to person, place, and time. She appears well-developed and well-nourished. No distress.  HENT:  Head: Normocephalic and atraumatic.  Eyes: EOM are normal. Pupils are equal, round, and reactive to light. No scleral icterus.  Neck: Normal range of motion. Neck  supple.  Cardiovascular: Normal rate, regular rhythm, S1 normal and S2 normal.   Murmur heard.  Systolic murmur is present with a grade of 2/6  Pulses:      Radial pulses are 2+ on the right side, and 2+ on the left side.  Respiratory: Effort normal and breath sounds normal. She has no wheezes. She has no rales.  GI: Soft. Bowel sounds are normal. She exhibits no distension. There is no tenderness.  Musculoskeletal: She exhibits no edema.  Lymphadenopathy:    She has no cervical adenopathy.  Neurological: She is alert and oriented to person, place, and time. She exhibits normal muscle tone.  Skin: Skin is warm and dry.  Psychiatric: She has a normal mood and affect.    Assessment/Plan Principal Problem:   CAD (coronary artery disease) Active Problems:   Essential hypertension, benign   Hyperlipidemia   CKD (chronic kidney disease), stage IV   Chronic combined systolic and diastolic CHF (congestive heart failure)  Recent ARDS Recent NSTEMI  Plan:  Start hydration at 3750ml/hr.  Strict I/Os.  BMET and CBC now.  AM BMET.  LHC tomorrow at 1430hrs.    Wilburt FinlayHAGER, BRYAN, PA-C 06/24/2014, 6:35 PM  Patient seen and examined with Wilburt FinlayBryan Hager PA-C. We discussed all aspects of the encounter. I agree with the assessment and plan as stated above. Drs. Cooper and Jordan's notes from last admit reviewed. She has improved. Her lasix was stopped at d/c and now has some fluid overload on exam with JVP 10-11cm. Minimal basilar crackles and tr-1+ LE edema. Will give lasix 80 IV now and start hydration early in am. Will recheck renal function today and in am. We discussed risk of renal failure with contrast exposure. I d/w Dr. Excell Seltzerooper by phone as well.   Truman Haywardaniel Eddith Mentor,MD 7:46 PM

## 2014-06-25 ENCOUNTER — Encounter (HOSPITAL_COMMUNITY)
Admission: RE | Disposition: A | Discharge status: Home / Self Care | Payer: Self-pay | Source: Ambulatory Visit | Attending: Cardiovascular Disease

## 2014-06-25 DIAGNOSIS — I2510 Atherosclerotic heart disease of native coronary artery without angina pectoris: Secondary | ICD-10-CM | POA: Diagnosis not present

## 2014-06-25 HISTORY — PX: LEFT HEART CATHETERIZATION WITH CORONARY ANGIOGRAM: SHX5451

## 2014-06-25 LAB — BASIC METABOLIC PANEL
ANION GAP: 20 — AB (ref 5–15)
BUN: 29 mg/dL — ABNORMAL HIGH (ref 6–23)
CHLORIDE: 98 meq/L (ref 96–112)
CO2: 22 meq/L (ref 19–32)
CREATININE: 2.55 mg/dL — AB (ref 0.50–1.10)
Calcium: 9.3 mg/dL (ref 8.4–10.5)
GFR calc Af Amer: 19 mL/min — ABNORMAL LOW (ref >90)
GFR calc non Af Amer: 16 mL/min — ABNORMAL LOW (ref >90)
Glucose, Bld: 88 mg/dL (ref 70–99)
Potassium: 4.4 mEq/L (ref 3.7–5.3)
SODIUM: 140 meq/L (ref 137–147)

## 2014-06-25 LAB — PROTIME-INR
INR: 0.99 (ref 0.00–1.49)
PROTHROMBIN TIME: 13.1 s (ref 11.6–15.2)

## 2014-06-25 SURGERY — LEFT HEART CATHETERIZATION WITH CORONARY ANGIOGRAM
Anesthesia: LOCAL

## 2014-06-25 MED ORDER — LIDOCAINE HCL (PF) 1 % IJ SOLN
INTRAMUSCULAR | Status: AC
Start: 2014-06-25 — End: 2014-06-25
  Filled 2014-06-25: qty 30

## 2014-06-25 MED ORDER — HEPARIN SODIUM (PORCINE) 1000 UNIT/ML IJ SOLN
INTRAMUSCULAR | Status: AC
  Filled 2014-06-25: qty 1

## 2014-06-25 MED ORDER — ONDANSETRON HCL 4 MG/2ML IJ SOLN: 4.0000 mg | Freq: Four times a day (QID) | INTRAMUSCULAR | Status: DC | PRN

## 2014-06-25 MED ORDER — NITROGLYCERIN 1 MG/10 ML FOR IR/CATH LAB
INTRA_ARTERIAL | Status: AC
  Filled 2014-06-25: qty 10

## 2014-06-25 MED ORDER — VERAPAMIL HCL 2.5 MG/ML IV SOLN
INTRAVENOUS | Status: AC
  Filled 2014-06-25: qty 2

## 2014-06-25 MED ORDER — HEPARIN (PORCINE) IN NACL 2-0.9 UNIT/ML-% IJ SOLN
INTRAMUSCULAR | Status: AC
  Filled 2014-06-25: qty 1500

## 2014-06-25 MED ORDER — SODIUM CHLORIDE 0.9 % IV SOLN: INTRAVENOUS | Status: AC

## 2014-06-25 MED ORDER — FENTANYL CITRATE 0.05 MG/ML IJ SOLN
INTRAMUSCULAR | Status: AC
  Filled 2014-06-25: qty 2

## 2014-06-25 MED ORDER — ACETAMINOPHEN 325 MG PO TABS: 650.0000 mg | ORAL_TABLET | ORAL | Status: DC | PRN

## 2014-06-25 MED ORDER — MIDAZOLAM HCL 2 MG/2ML IJ SOLN
INTRAMUSCULAR | Status: AC
  Filled 2014-06-25: qty 2

## 2014-06-25 NOTE — Interval H&P Note (Signed)
History and Physical Interval Note:  06/25/2014 1:06 PM  [name]  has presented today for surgery, with the diagnosis of chest pain  The various methods of treatment have been discussed with the patient and family. After consideration of risks, benefits and other options for treatment, the patient has consented to  Procedure(s): LEFT HEART CATHETERIZATION WITH CORONARY ANGIOGRAM (N/A) as a surgical intervention .  The patient's history has been reviewed, patient examined, no change in status, stable for surgery.  I have reviewed the patient's chart and labs.  Questions were answered to the patient's satisfaction.    Cath Lab Visit (complete for each Cath Lab visit)  Clinical Evaluation Leading to the Procedure:   ACS: No.  Non-ACS:    Anginal Classification: CCS III  Anti-ischemic medical therapy: Minimal Therapy (1 class of medications)  Non-Invasive Test Results: No non-invasive testing performed  Prior CABG: No previous CABG       Tonny BollmanMichael Regnald Bowens

## 2014-06-25 NOTE — H&P (View-Only) (Signed)
SUBJECTIVE:  No chest pain. Tolerating IV hydration pre cath. No SHOB.  OBJECTIVE:   Vitals:   Filed Vitals:   06/24/14 1600 06/24/14 1945 06/25/14 0604  BP: 129/53 146/59 151/60  Pulse: 69 80 75  Temp: 97.9 F (36.6 C) 98 F (36.7 C) 98.1 F (36.7 C)  TempSrc: Oral Oral Oral  Resp: 18 18 18   Weight:  125 lb 1.6 oz (56.745 kg) 120 lb 11.2 oz (54.749 kg)  SpO2: 99% 99% 94%   I&O's:   Intake/Output Summary (Last 24 hours) at 06/25/14 1043 Last data filed at 06/25/14 0800  Gross per 24 hour  Intake    195 ml  Output   1650 ml  Net  -1455 ml   TELEMETRY: Reviewed telemetry pt in NSR:     PHYSICAL EXAM General: Well developed, well nourished, in no acute distress Head:   Normal cephalic and atramatic  Lungs:   Clear bilaterally to auscultation. Heart:   HRRR S1 S2  No JVD.  3/6 systolic murmur   Abdomen: abdomen soft and non-tender Msk:  Back normal,  Normal strength and tone for age. Extremities:  No edema.   Neuro: Alert and oriented. Psych:  Normal affect, responds appropriately   LABS: Basic Metabolic Panel:  Recent Labs  16/08/9606/28/15 2025 06/25/14 0525  NA 138 140  K 5.0 4.4  CL 101 98  CO2 22 22  GLUCOSE 118* 88  BUN 30* 29*  CREATININE 2.34* 2.55*  CALCIUM 9.3 9.3   Liver Function Tests: No results found for this basename: AST, ALT, ALKPHOS, BILITOT, PROT, ALBUMIN,  in the last 72 hours No results found for this basename: LIPASE, AMYLASE,  in the last 72 hours CBC:  Recent Labs  06/24/14 2025  WBC 8.7  HGB 8.5*  HCT 25.9*  MCV 91.2  PLT 333   Cardiac Enzymes: No results found for this basename: CKTOTAL, CKMB, CKMBINDEX, TROPONINI,  in the last 72 hours BNP: No components found with this basename: POCBNP,  D-Dimer: No results found for this basename: DDIMER,  in the last 72 hours Hemoglobin A1C: No results found for this basename: HGBA1C,  in the last 72 hours Fasting Lipid Panel: No results found for this basename: CHOL, HDL, LDLCALC,  TRIG, CHOLHDL, LDLDIRECT,  in the last 72 hours Thyroid Function Tests: No results found for this basename: TSH, T4TOTAL, FREET3, T3FREE, THYROIDAB,  in the last 72 hours Anemia Panel: No results found for this basename: VITAMINB12, FOLATE, FERRITIN, TIBC, IRON, RETICCTPCT,  in the last 72 hours Coag Panel:   Lab Results  Component Value Date   INR 0.99 06/25/2014   INR 2.43* 04/08/2013   INR 3.1 04/01/2013    RADIOLOGY: Dg Chest 2 View  06/16/2014   CLINICAL DATA:  Cough, nausea  EXAM: CHEST  2 VIEW  COMPARISON:  04/10/2013  FINDINGS: The aorta is unfolded and ectatic. Mild moderate enlargement of the cardiac silhouette is noted. Central vascular congestion is identified with peripheral Kerley B-lines compatible with interstitial edema. Trace effusions are noted. Hyperinflation suggests emphysema. Atheromatous aortic calcification noted without calcified aneurysm.  IMPRESSION: Cardiomegaly with mild interstitial pulmonary edema and trace pleural effusions.   Electronically Signed   By: Christiana PellantGretchen  Green M.D.   On: 06/16/2014 08:27   Dg Chest Port 1 View  06/19/2014   CLINICAL DATA:  Extubation.  EXAM: PORTABLE CHEST - 1 VIEW  COMPARISON:  05/29/2014.  FINDINGS: Right IJ line in stable position. Endotracheal tube has been removed. NG  tube has been removed. Bibasilar atelectasis and left pleural effusion noted. Left lower lobe infiltrate cannot be excluded. No pneumothorax. Stable cardiomegaly. No acute osseus abnormality.  IMPRESSION: 1. Interim extubation and removal of NG tube. 2. Bibasilar atelectasis and left pleural effusion. Left lower lobe infiltrate cannot be excluded.   Electronically Signed   By: Maisie Fus  Register   On: 06/19/2014 07:22   Dg Chest Port 1 View  06/18/2014   CLINICAL DATA:  Evaluate endotracheal tube  EXAM: PORTABLE CHEST - 1 VIEW  COMPARISON:  Prior chest x-ray 06/17/2014  FINDINGS: The endotracheal tube is 3.2 cm above the carina. Nasogastric tube in unchanged position. Tip  lies below the diaphragm presumably within the stomach. Right IJ approach central venous catheter with the tip in the mid SVC. Metallic artifact overlies the chest in the form of multiple cardiac leads.  Stable cardiac and mediastinal contours with borderline cardiomegaly. Probable small layering left pleural effusion with associated atelectasis versus infiltrate. Background changes of COPD with pulmonary hyperexpansion, central bronchitic changes and diffuse mild interstitial prominence. Minimal pulmonary vascular congestion without interstitial edema. No acute osseous abnormality. No pneumothorax.  IMPRESSION: 1. No significant interval change in the appearance of the chest over the last 24 hr. However, compared to 06/16/2014 there has been interval resolution of CHF. 2. Residual minimal pulmonary vascular congestion without edema and left basilar opacity favored to reflect a small layering effusion and atelectasis. Superimposed infiltrate difficult to exclude radiographically. 3. Stable and satisfactory support apparatus.   Electronically Signed   By: Malachy Moan M.D.   On: 06/18/2014 07:26   Dg Chest Port 1 View  06/17/2014   CLINICAL DATA:  Shortness of breath.  EXAM: PORTABLE CHEST - 1 VIEW  COMPARISON:  06/16/2014.  FINDINGS: Endotracheal tube, NG tube, right IJ line in stable position. Stable bibasilar atelectasis and/or infiltrates. No pleural effusion or pneumothorax. Heart size and pulmonary vascularity stable. No acute osseous abnormality.  IMPRESSION: 1. Stable line and tube positions. 2. Stable bibasilar atelectasis and/or infiltrates.   Electronically Signed   By: Maisie Fus  Register   On: 06/17/2014 07:14   Dg Chest Port 1 View  06/16/2014   CLINICAL DATA:  EvaluateRUTLC  EXAM: PORTABLE CHEST - 1 VIEW  COMPARISON:  Chest radiograph 06/16/2014  FINDINGS: Interval intubation with endotracheal tube terminating within the mid trachea. Enteric tube courses inferior to the diaphragm, tip not  included on this examination. Right IJ central venous catheter tip projects at the superior cavoatrial junction.  Stable cardiac and mediastinal contours. Slight interval improvement in bilateral interstitial pulmonary opacities. Persistent retrocardiac consolidative opacity. No definite pleural effusion or pneumothorax.  IMPRESSION: ET tube terminates in the mid trachea.  Enteric tube courses inferior to the diaphragm, tip not included on this examination.  Right IJ central venous catheter tip projects at the superior cavoatrial junction. No definite right-sided pneumothorax.  Persistent consolidation in the retrocardiac region may represent atelectasis or infection.  Slight improvement parahilar interstitial opacities, potentially improving pulmonary edema.   Electronically Signed   By: Annia Belt M.D.   On: 06/16/2014 13:49   Dg Chest Portable 1 View  06/16/2014   CLINICAL DATA:  Chest pain, shortness of breath.  EXAM: PORTABLE CHEST - 1 VIEW  COMPARISON:  Chest radiograph 06/16/2014.  FINDINGS: Stable cardiac and mediastinal contours. Interval increase in bilateral interstitial pulmonary opacities. Retrocardiac consolidation with obscuration of the left hemidiaphragm. No definite pleural effusion or pneumothorax.  IMPRESSION: Interval development of bilateral interstitial pulmonary opacities, suggestive  of congestive heart failure.  Retrocardiac consolidative opacity favored to represent atelectasis. Infection not excluded.   Electronically Signed   By: Annia Belt M.D.   On: 06/16/2014 12:33   Dg Abd Portable 1v  06/16/2014   CLINICAL DATA:  OG tube placement.  EXAM: PORTABLE ABDOMEN - 1 VIEW  COMPARISON:  CT abdomen and pelvis 10/11/2003.  FINDINGS: OG tube is in place with the tip in the body of the stomach. Left basilar airspace disease is noted.  IMPRESSION: NG tube tip is in the stomach.  Left basilar airspace disease.   Electronically Signed   By: Drusilla Kanner M.D.   On: 06/16/2014 15:58       ASSESSMENT: [name]:   1) CAD: planing for cath today. CRI: Cr 2.5 Minimize contrast exposure.  No Vgram.  Tolerating hydration.  Questions about the procedure were answered.  Corky Crafts., MD  06/25/2014  10:43 AM

## 2014-06-25 NOTE — Progress Notes (Signed)
SUBJECTIVE:  No chest pain. Tolerating IV hydration pre cath. No SHOB.  OBJECTIVE:   Vitals:   Filed Vitals:   06/24/14 1600 06/24/14 1945 06/25/14 0604  BP: 129/53 146/59 151/60  Pulse: 69 80 75  Temp: 97.9 F (36.6 C) 98 F (36.7 C) 98.1 F (36.7 C)  TempSrc: Oral Oral Oral  Resp: 18 18 18   Weight:  125 lb 1.6 oz (56.745 kg) 120 lb 11.2 oz (54.749 kg)  SpO2: 99% 99% 94%   I&O's:   Intake/Output Summary (Last 24 hours) at 06/25/14 1043 Last data filed at 06/25/14 0800  Gross per 24 hour  Intake    195 ml  Output   1650 ml  Net  -1455 ml   TELEMETRY: Reviewed telemetry pt in NSR:     PHYSICAL EXAM General: Well developed, well nourished, in no acute distress Head:   Normal cephalic and atramatic  Lungs:   Clear bilaterally to auscultation. Heart:   HRRR S1 S2  No JVD.  3/6 systolic murmur   Abdomen: abdomen soft and non-tender Msk:  Back normal,  Normal strength and tone for age. Extremities:  No edema.   Neuro: Alert and oriented. Psych:  Normal affect, responds appropriately   LABS: Basic Metabolic Panel:  Recent Labs  16/08/9606/28/15 2025 06/25/14 0525  NA 138 140  K 5.0 4.4  CL 101 98  CO2 22 22  GLUCOSE 118* 88  BUN 30* 29*  CREATININE 2.34* 2.55*  CALCIUM 9.3 9.3   Liver Function Tests: No results found for this basename: AST, ALT, ALKPHOS, BILITOT, PROT, ALBUMIN,  in the last 72 hours No results found for this basename: LIPASE, AMYLASE,  in the last 72 hours CBC:  Recent Labs  06/24/14 2025  WBC 8.7  HGB 8.5*  HCT 25.9*  MCV 91.2  PLT 333   Cardiac Enzymes: No results found for this basename: CKTOTAL, CKMB, CKMBINDEX, TROPONINI,  in the last 72 hours BNP: No components found with this basename: POCBNP,  D-Dimer: No results found for this basename: DDIMER,  in the last 72 hours Hemoglobin A1C: No results found for this basename: HGBA1C,  in the last 72 hours Fasting Lipid Panel: No results found for this basename: CHOL, HDL, LDLCALC,  TRIG, CHOLHDL, LDLDIRECT,  in the last 72 hours Thyroid Function Tests: No results found for this basename: TSH, T4TOTAL, FREET3, T3FREE, THYROIDAB,  in the last 72 hours Anemia Panel: No results found for this basename: VITAMINB12, FOLATE, FERRITIN, TIBC, IRON, RETICCTPCT,  in the last 72 hours Coag Panel:   Lab Results  Component Value Date   INR 0.99 06/25/2014   INR 2.43* 04/08/2013   INR 3.1 04/01/2013    RADIOLOGY: Dg Chest 2 View  06/16/2014   CLINICAL DATA:  Cough, nausea  EXAM: CHEST  2 VIEW  COMPARISON:  04/10/2013  FINDINGS: The aorta is unfolded and ectatic. Mild moderate enlargement of the cardiac silhouette is noted. Central vascular congestion is identified with peripheral Kerley B-lines compatible with interstitial edema. Trace effusions are noted. Hyperinflation suggests emphysema. Atheromatous aortic calcification noted without calcified aneurysm.  IMPRESSION: Cardiomegaly with mild interstitial pulmonary edema and trace pleural effusions.   Electronically Signed   By: Christiana PellantGretchen  Green M.D.   On: 06/16/2014 08:27   Dg Chest Port 1 View  06/19/2014   CLINICAL DATA:  Extubation.  EXAM: PORTABLE CHEST - 1 VIEW  COMPARISON:  05/29/2014.  FINDINGS: Right IJ line in stable position. Endotracheal tube has been removed. NG  tube has been removed. Bibasilar atelectasis and left pleural effusion noted. Left lower lobe infiltrate cannot be excluded. No pneumothorax. Stable cardiomegaly. No acute osseus abnormality.  IMPRESSION: 1. Interim extubation and removal of NG tube. 2. Bibasilar atelectasis and left pleural effusion. Left lower lobe infiltrate cannot be excluded.   Electronically Signed   By: Maisie Fus  Register   On: 06/19/2014 07:22   Dg Chest Port 1 View  06/18/2014   CLINICAL DATA:  Evaluate endotracheal tube  EXAM: PORTABLE CHEST - 1 VIEW  COMPARISON:  Prior chest x-ray 06/17/2014  FINDINGS: The endotracheal tube is 3.2 cm above the carina. Nasogastric tube in unchanged position. Tip  lies below the diaphragm presumably within the stomach. Right IJ approach central venous catheter with the tip in the mid SVC. Metallic artifact overlies the chest in the form of multiple cardiac leads.  Stable cardiac and mediastinal contours with borderline cardiomegaly. Probable small layering left pleural effusion with associated atelectasis versus infiltrate. Background changes of COPD with pulmonary hyperexpansion, central bronchitic changes and diffuse mild interstitial prominence. Minimal pulmonary vascular congestion without interstitial edema. No acute osseous abnormality. No pneumothorax.  IMPRESSION: 1. No significant interval change in the appearance of the chest over the last 24 hr. However, compared to 06/16/2014 there has been interval resolution of CHF. 2. Residual minimal pulmonary vascular congestion without edema and left basilar opacity favored to reflect a small layering effusion and atelectasis. Superimposed infiltrate difficult to exclude radiographically. 3. Stable and satisfactory support apparatus.   Electronically Signed   By: Malachy Moan M.D.   On: 06/18/2014 07:26   Dg Chest Port 1 View  06/17/2014   CLINICAL DATA:  Shortness of breath.  EXAM: PORTABLE CHEST - 1 VIEW  COMPARISON:  06/16/2014.  FINDINGS: Endotracheal tube, NG tube, right IJ line in stable position. Stable bibasilar atelectasis and/or infiltrates. No pleural effusion or pneumothorax. Heart size and pulmonary vascularity stable. No acute osseous abnormality.  IMPRESSION: 1. Stable line and tube positions. 2. Stable bibasilar atelectasis and/or infiltrates.   Electronically Signed   By: Maisie Fus  Register   On: 06/17/2014 07:14   Dg Chest Port 1 View  06/16/2014   CLINICAL DATA:  EvaluateRUTLC  EXAM: PORTABLE CHEST - 1 VIEW  COMPARISON:  Chest radiograph 06/16/2014  FINDINGS: Interval intubation with endotracheal tube terminating within the mid trachea. Enteric tube courses inferior to the diaphragm, tip not  included on this examination. Right IJ central venous catheter tip projects at the superior cavoatrial junction.  Stable cardiac and mediastinal contours. Slight interval improvement in bilateral interstitial pulmonary opacities. Persistent retrocardiac consolidative opacity. No definite pleural effusion or pneumothorax.  IMPRESSION: ET tube terminates in the mid trachea.  Enteric tube courses inferior to the diaphragm, tip not included on this examination.  Right IJ central venous catheter tip projects at the superior cavoatrial junction. No definite right-sided pneumothorax.  Persistent consolidation in the retrocardiac region may represent atelectasis or infection.  Slight improvement parahilar interstitial opacities, potentially improving pulmonary edema.   Electronically Signed   By: Annia Belt M.D.   On: 06/16/2014 13:49   Dg Chest Portable 1 View  06/16/2014   CLINICAL DATA:  Chest pain, shortness of breath.  EXAM: PORTABLE CHEST - 1 VIEW  COMPARISON:  Chest radiograph 06/16/2014.  FINDINGS: Stable cardiac and mediastinal contours. Interval increase in bilateral interstitial pulmonary opacities. Retrocardiac consolidation with obscuration of the left hemidiaphragm. No definite pleural effusion or pneumothorax.  IMPRESSION: Interval development of bilateral interstitial pulmonary opacities, suggestive  of congestive heart failure.  Retrocardiac consolidative opacity favored to represent atelectasis. Infection not excluded.   Electronically Signed   By: Annia Belt M.D.   On: 06/16/2014 12:33   Dg Abd Portable 1v  06/16/2014   CLINICAL DATA:  OG tube placement.  EXAM: PORTABLE ABDOMEN - 1 VIEW  COMPARISON:  CT abdomen and pelvis 10/11/2003.  FINDINGS: OG tube is in place with the tip in the body of the stomach. Left basilar airspace disease is noted.  IMPRESSION: NG tube tip is in the stomach.  Left basilar airspace disease.   Electronically Signed   By: Drusilla Kanner M.D.   On: 06/16/2014 15:58       ASSESSMENT: [name]:   1) CAD: planing for cath today. CRI: Cr 2.5 Minimize contrast exposure.  No Vgram.  Tolerating hydration.  Questions about the procedure were answered.  Corky Crafts., MD  06/25/2014  10:43 AM

## 2014-06-25 NOTE — CV Procedure (Signed)
    Cardiac Catheterization Procedure Note  Name: Deanna KnackCecelia W Herrera MRN: [id number] DOB: 02/13/1930  Procedure: Left Heart Cath, Selective Coronary Angiography  Indication: Progressive angina, CCS Class 3 despite maximal medical therapy.   Procedural Details: The right wrist was prepped, draped, and anesthetized with 1% lidocaine. Using the modified Seldinger technique, a 5 French sheath was introduced into the right radial artery. 3 mg of verapamil was administered through the sheath, weight-based unfractionated heparin was administered intravenously. Standard Judkins catheters were used for selective coronary angiography. Left ventriculography was deferred because of chronic kidney disease. Catheter exchanges were performed over an exchange length guidewire. There were no immediate procedural complications. A TR band was used for radial hemostasis at the completion of the procedure.  The patient was transferred to the post catheterization recovery area for further monitoring.  Procedural Findings: Hemodynamics: AO 132/49 LV 132/8  Coronary angiography: Coronary dominance: right  Left mainstem: The left main short. The vessel is patent without obstructive disease.  Left anterior descending (LAD): The LAD is heavily calcified. There is an eccentric 80% stenosis in the proximal vessel before the first septal perforator. The mid and distal vessel are patent with mild to moderate diffuse disease noted. The diagonal branch is relatively small without significant obstruction  Left circumflex (LCx): The circumflex is patent. The stented segment in the first obtuse marginal is widely patent without in-stent restenosis. The OM branch is tortuous and it divides into twin vessels. The branch vessels of the OM are patent with 50% stenosis in the more distal branch. The AV circumflex just beyond the obtuse marginal branch has 80% stenosis. The third OM branch has diffuse 75% stenosis.  Right coronary  artery (RCA): The RCA has severe diffuse disease with multiple 90% stenoses in the proximal and mid vessel leading into a total occlusion in the distal vessel. The distal branch vessels of the RCA filled from left to right collaterals.  Left ventriculography: Deferred because of advanced chronic kidney disease   Final Conclusions:   3 vessel coronary artery disease with severe stenosis of the proximal LAD, severe stenosis of the mid left circumflex, continued patency of the stented segment in a large OM branch, and total occlusion of the right coronary artery with left-to-right collaterals.  Recommendations: The patient has marked progression of her coronary artery disease from her last cardiac cath study 2 years ago. I don't think she has any to percutaneous revascularization options unless we were to do extensive PCI which clearly would result in a high likelihood of end-stage renal disease. Will review treatment options. She has had respiratory failure now on multiple occasions requiring mechanical ventilation. She is now a DO NOT RESUSCITATE. I do not think she would want to consider coronary bypass surgery. Will review at length with the patient and her family.  Tonny BollmanMichael Ronasia Isola 06/25/2014, 1:44 PM

## 2014-06-25 NOTE — Progress Notes (Addendum)
Post-Cath Note:  Lengthy discussion with patient this evening about treatment options. She has marked progression of CAD since her last cath 2 years ago. Options include CABG and multivessel PCI, both of which carry risk of progression of renal disease. Her current GFR is less than 20. She does not want to risk progression to hemodialysis under any circumstances. She favors palliative medical therapy at this point. I offered her referral to [facility] for specific discussion of pros/cons of CABG but she is not interested.   As long as renal fxn is stable in the am, would discharge home on medical therapy and follow-up with Norma FredricksonLori Gerhardt as scheduled next week. She should be discharged on maintenance diuretic (lasix 40 mg) as she gained significant weight prior to this admission when her diuretic was held.   Tonny BollmanMichael Tiombe Tomeo 06/25/2014 10:59 PM

## 2014-06-25 NOTE — Progress Notes (Signed)
Utilization Review Completed.[name] T7/29/2015  

## 2014-06-26 ENCOUNTER — Encounter (HOSPITAL_COMMUNITY): Payer: Self-pay | Admitting: Physician Assistant

## 2014-06-26 DIAGNOSIS — I359 Nonrheumatic aortic valve disorder, unspecified: Secondary | ICD-10-CM

## 2014-06-26 DIAGNOSIS — I2510 Atherosclerotic heart disease of native coronary artery without angina pectoris: Secondary | ICD-10-CM | POA: Diagnosis not present

## 2014-06-26 LAB — BASIC METABOLIC PANEL
Anion gap: 15 (ref 5–15)
BUN: 29 mg/dL — AB (ref 6–23)
CO2: 23 mEq/L (ref 19–32)
Calcium: 8.4 mg/dL (ref 8.4–10.5)
Chloride: 95 mEq/L — ABNORMAL LOW (ref 96–112)
Creatinine, Ser: 2.62 mg/dL — ABNORMAL HIGH (ref 0.50–1.10)
GFR, EST AFRICAN AMERICAN: 18 mL/min — AB (ref >90)
GFR, EST NON AFRICAN AMERICAN: 16 mL/min — AB (ref >90)
GLUCOSE: 125 mg/dL — AB (ref 70–99)
POTASSIUM: 4.8 meq/L (ref 3.7–5.3)
Sodium: 133 mEq/L — ABNORMAL LOW (ref 137–147)

## 2014-06-26 MED ORDER — FUROSEMIDE 40 MG PO TABS: 40.0000 mg | ORAL_TABLET | Freq: Every day | ORAL | Status: DC

## 2014-06-26 NOTE — Discharge Summary (Signed)
Discharge Summary   Patient ID: [name] MRN: [id number], DOB/AGE: 06/09/1930 80 y.o. Admit date: 06/24/2014 D/C date:     06/26/2014  Primary Cardiologist: Dr. Excell Seltzer  Principal Problem:   CAD (coronary artery disease) Active Problems:   Essential hypertension, benign   Hyperlipidemia   CKD (chronic kidney disease), stage IV   Chronic combined systolic and diastolic CHF (congestive heart failure)   Admission Dates: 06/24/14- 06/26/14 Discharge Diagnosis: recent NSTEMI admitted for pre-hydration for planned limited contrast cardiac cath   HPI: [name] is a 80 y.o. female with a history of CAD s/p DES LCX 7/13, chronic systolic CHF, paroxysmal Afib, CKD stage IV, and hypertension who presented to [facility] on 06/24/14 for pre-hydration for planned cardiac cath the following day.   She was recently admitted to [facility] from 06/16/14-06/20/14 for acute respiratory failure secondary to pulmonary edema and possibly ARDS. The patient was intubated and transferred to the ICU. The patient's troponins were noted to be elevated (peaked 10.4) and cardiology was consulted. The patient was treated for NSTEMI and started on a heparin drip. Heparin drip was discontinued on July 20 13,015 and the patient was started on Plavix. The patient was started on intravenous antibiotics for a UTI of which there was concern that the patient developed ARDS. The patient's antibiotic coverage was narrowed, and the patient was ultimately extubated on 06/18/2014. Because of the patient's CKD, comorbidities, and echocardiogram with EF 50%, it was decided to defer cardiac catheterization and continue with medical/noninvasive management. After a long discussion with Dr. Excell Seltzer it was decided to proceed with a limited contrast cath study to determine if revascularization might be feasible. It was thought best to let her recover from her current illness and have cardiac cath the following week. She will be admitted the  night before for cath for pre-hydration as risk of cardiac catheterization/ worsening acute kidney injury is high. She was discharged on 06/20/14. Her creat was at her baseline of 2.2 at that time and she was not discharged home on maintenance diuretics.   Hospital Course: She returned to [facility] on 06/24/14 for pre-cath hydration. She did report some DOE at that time and was thought to be mildly volume overloaded on exam. She was started on IV Lasix and hydration was started early AM the day of her cardiac cath. She underwent cardiac cath on 06/25/14 which revealed 3 V CAD with severe stenosis of the proximal LAD, severe stenosis of the mid left circumflex, continued patency of the stented segment in a large OM branch, and total occlusion of the right coronary artery with left-to-right collaterals. The patient has marked progression of her CAD from her last cardiac cath study 2 years ago. Dr. Excell Seltzer did not think that she had any percutaneous revascularization options unless they were to do an extensive PCI which clearly would result in a high likelihood of end-stage renal disease. Dr. Excell Seltzer had a lengthy discussion with the patient about treatment options. Options include CABG and multivessel PCI, both of which carry risk of progression of renal disease. Her current GFR is less than 20. She does not want to risk progression to hemodialysis under any circumstances. She favors palliative medical therapy at this point. She was offered a referral to [facility] for specific discussion of pros/cons of CABG but she was not interested.  She did establish a DNR code status. She was kept overnight to follow renal function and continue to monitor. Her creat is stable this AM at  2.55 and she is feeling well.  -- Continue medical therapy with ASA/ Plavix and BB. She has listed allergy to statin. Continue imdur.    Chronic combined systolic and diastolic CHF-- 2D ECHO: 06/17/2014 EF 50-55%, mild LVH. No WMAs. G1DD. Mild MR. Mild MR.  Mod TR. PA pk pressure 48.  -- She did report some DOE upon admission and was thought to be mildly volume overloaded on exam. She was started on IV Lasix and hydration was started early AM the day of her cardiac cath.  -- Per Dr. Excell Seltzer; she should be discharged on maintenance diuretic (lasix 40 mg) as she gained significant weight prior to this admission when her diuretic was held.   Essential hypertension, benign - well controlled on Zebeta 5mg , hydralazine 25 bid and imdur 60mg  qd.   Hyperlipidemia - allergy to statin   CKD (chronic kidney disease), stage IV - creat stable at 2.55. Her creat was 2.34 on admission.  The patient has had an uncomplicated hospital course and is recovering well. The radial catheter site is stable. She has been seen by Dr. Eldridge Dace today and deemed ready for discharge home. All follow-up appointments have been scheduled.  Discharge medications include are listed below. She will be discharged on maintenance diuretic (lasix 40 mg) as she gained significant weight prior to this admission when her diuretic was held. She is on ASA/Plavix, bisoprolol 5mg  qd, imdur 60mg  qd, hydralazine 25mg  BID, and SL NTG. She has completed a 7 day course of cipro for UTI so this has been discontinued.     Discharge Vitals: Blood pressure 131/49, pulse 74, temperature 98.8 F (37.1 C), temperature source Oral, resp. rate 18, weight 120 lb 9.6 oz (54.704 kg), SpO2 94.00%.  Labs: Lab Results  Component Value Date   WBC 8.7 06/24/2014   HGB 8.5* 06/24/2014   HCT 25.9* 06/24/2014   MCV 91.2 06/24/2014   PLT 333 06/24/2014     Recent Labs Lab 06/25/14 0525  NA 140  K 4.4  CL 98  CO2 22  BUN 29*  CREATININE 2.55*  CALCIUM 9.3  GLUCOSE 88     Diagnostic Studies/Procedures   06/25/14 Cardiac Catheterization Procedure Note  Name: [name]  MRN: [id number]  DOB: 08-23-1930  Procedure: Left Heart Cath, Selective Coronary Angiography  Indication: Progressive angina, CCS  Class 3 despite maximal medical therapy.  Procedural Details: The right wrist was prepped, draped, and anesthetized with 1% lidocaine. Using the modified Seldinger technique, a 5 French sheath was introduced into the right radial artery. 3 mg of verapamil was administered through the sheath, weight-based unfractionated heparin was administered intravenously. Standard Judkins catheters were used for selective coronary angiography. Left ventriculography was deferred because of chronic kidney disease. Catheter exchanges were performed over an exchange length guidewire. There were no immediate procedural complications. A TR band was used for radial hemostasis at the completion of the procedure. The patient was transferred to the post catheterization recovery area for further monitoring.  Procedural Findings:  Hemodynamics:  AO 132/49  LV 132/8  Coronary angiography:  Coronary dominance: right  Left mainstem: The left main short. The vessel is patent without obstructive disease.  Left anterior descending (LAD): The LAD is heavily calcified. There is an eccentric 80% stenosis in the proximal vessel before the first septal perforator. The mid and distal vessel are patent with mild to moderate diffuse disease noted. The diagonal branch is relatively small without significant obstruction  Left circumflex (LCx): The circumflex is patent.  The stented segment in the first obtuse marginal is widely patent without in-stent restenosis. The OM branch is tortuous and it divides into twin vessels. The branch vessels of the OM are patent with 50% stenosis in the more distal branch. The AV circumflex just beyond the obtuse marginal branch has 80% stenosis. The third OM branch has diffuse 75% stenosis.  Right coronary artery (RCA): The RCA has severe diffuse disease with multiple 90% stenoses in the proximal and mid vessel leading into a total occlusion in the distal vessel. The distal branch vessels of the RCA filled from  left to right collaterals.  Left ventriculography: Deferred because of advanced chronic kidney disease  Final Conclusions:  3 vessel coronary artery disease with severe stenosis of the proximal LAD, severe stenosis of the mid left circumflex, continued patency of the stented segment in a large OM branch, and total occlusion of the right coronary artery with left-to-right collaterals.  Recommendations: The patient has marked progression of her coronary artery disease from her last cardiac cath study 2 years ago. I don't think she has any to percutaneous revascularization options unless we were to do extensive PCI which clearly would result in a high likelihood of end-stage renal disease. Will review treatment options. She has had respiratory failure now on multiple occasions requiring mechanical ventilation. She is now a DO NOT RESUSCITATE. I do not think she would want to consider coronary bypass surgery. Will review at length with the patient and her family.   2D ECHO: 06/17/2014 EF  LV EF: 50% - 55% Study Conclusions - Left ventricle: The cavity size was normal. Wall thickness was increased in a pattern of mild LVH. Systolic function was normal. The estimated ejection fraction was in the range of 50% to 55%. Wall motion was normal; there were no regional wall motion abnormalities. Doppler parameters are consistent with abnormal left ventricular relaxation (grade 1 diastolic dysfunction). Doppler parameters are consistent with high ventricular filling pressure. - Aortic valve: There was mild regurgitation. - Mitral valve: Calcified annulus. Mildly thickened leaflets . There was mild regurgitation. - Tricuspid valve: There was moderate regurgitation. - Pulmonary arteries: PA peak pressure: 48 mm Hg (S).   Discharge Medications     Medication List    STOP taking these medications       ciprofloxacin 250 MG tablet  Commonly known as:  CIPRO      TAKE these medications        aspirin EC 81 MG tablet  Take 1 tablet (81 mg total) by mouth daily.     b complex vitamins tablet  Take 1 tablet by mouth daily.     bisoprolol 5 MG tablet  Commonly known as:  ZEBETA  Take 5 mg by mouth daily.     CINNAMON PO  Take 1 capsule by mouth daily.     CLARITIN 10 MG tablet  Generic drug:  loratadine  Take 10 mg by mouth daily.     clopidogrel 75 MG tablet  Commonly known as:  PLAVIX  Take 1 tablet (75 mg total) by mouth daily with breakfast.     fish oil-omega-3 fatty acids 1000 MG capsule  Take 1 g by mouth 2 (two) times daily.     furosemide 40 MG tablet  Commonly known as:  LASIX  Take 1 tablet (40 mg total) by mouth daily.     guaifenesin 100 MG/5ML syrup  Commonly known as:  ROBITUSSIN  Take 100 mg by mouth 3 (three) times daily  as needed for cough.     hydrALAZINE 25 MG tablet  Commonly known as:  APRESOLINE  Take 1 tablet (25 mg total) by mouth 2 (two) times daily.     isosorbide mononitrate 60 MG 24 hr tablet  Commonly known as:  IMDUR  Take 1 tablet (60 mg total) by mouth daily.     multivitamin with minerals tablet  Take 1 tablet by mouth daily as needed (supplementation).     nitroGLYCERIN 0.4 MG SL tablet  Commonly known as:  NITROSTAT  Place 1 tablet (0.4 mg total) under the tongue every 5 (five) minutes x 3 doses as needed for chest pain.     Vitamin D-3 5000 UNITS Tabs  Take 5,000 Units by mouth daily.        Disposition   The patient will be discharged in stable condition to home.  Follow-up Information   Follow up with Norma FredricksonLORI GERHARDT, NP On 07/01/2014. (@ 9am )    Specialty:  Nurse Practitioner   Contact information:   [address] [place] [address] [phone]         Duration of Discharge Encounter: Greater than 30 minutes including physician and PA time.  Signed, Venetia MaxonSTERN, KATHRYN PA-C 06/26/2014, 9:18 AM   I have examined the patient and reviewed assessment and plan and discussed with patient.   Agree with above as stated.  Medical therapy for significant CAD. Multiple medical issues make invasive treatment difficult. F/u with Dr. Excell Seltzerooper.   Gladys Gutman S.

## 2014-06-26 NOTE — Progress Notes (Signed)
Patient Name: [name] Date of Encounter: 06/26/2014     Principal Problem:   CAD (coronary artery disease) Active Problems:   Essential hypertension, benign   Hyperlipidemia   CKD (chronic kidney disease), stage IV   Chronic combined systolic and diastolic CHF (congestive heart failure)    SUBJECTIVE  Feeling well. Wants to go home. No CP or SOB. Slept well last night- no orthopnea.  CURRENT MEDS . aspirin EC  81 mg Oral Daily  . bisoprolol  5 mg Oral Daily  . ciprofloxacin  250 mg Oral BID  . clopidogrel  75 mg Oral Q breakfast  . hydrALAZINE  25 mg Oral BID  . isosorbide mononitrate  60 mg Oral Daily    OBJECTIVE  Filed Vitals:   06/25/14 1552 06/25/14 1622 06/25/14 2030 06/26/14 0536  BP: 132/56 118/52 144/51 131/49  Pulse: 63 66 71 74  Temp:   97.8 F (36.6 C) 98.8 F (37.1 C)  TempSrc:   Oral Oral  Resp: 18 18 18 18   Weight:    120 lb 9.6 oz (54.704 kg)  SpO2: 96% 94% 94% 94%    Intake/Output Summary (Last 24 hours) at 06/26/14 0750 Last data filed at 06/26/14 0649  Gross per 24 hour  Intake    195 ml  Output      0 ml  Net    195 ml   Filed Weights   06/24/14 1945 06/25/14 0604 06/26/14 0536  Weight: 125 lb 1.6 oz (56.745 kg) 120 lb 11.2 oz (54.749 kg) 120 lb 9.6 oz (54.704 kg)    PHYSICAL EXAM  General: Pleasant, NAD. Neuro: Alert and oriented X 3. Moves all extremities spontaneously. Psych: Normal affect. HEENT:  Normal  Neck: Supple without bruits or JVD. Lungs:  Resp regular and unlabored, CTA. Heart: RRR no s3, s4, or murmurs. Abdomen: Soft, non-tender, non-distended, BS + x 4.  Extremities: No clubbing, cyanosis or edema. DP/PT/Radials 2+ and equal bilaterally.  Accessory Clinical Findings  CBC  Recent Labs  06/24/14 2025  WBC 8.7  HGB 8.5*  HCT 25.9*  MCV 91.2  PLT 333   Basic Metabolic Panel  Recent Labs  06/24/14 2025 06/25/14 0525  NA 138 140  K 5.0 4.4  CL 101 98  CO2 22 22  GLUCOSE 118* 88  BUN  30* 29*  CREATININE 2.34* 2.55*  CALCIUM 9.3 9.3   TELE  NSR with chronic LBBB  Radiology/Studies  Dg Chest 2 View  06/16/2014   CLINICAL DATA:  Cough, nausea  EXAM: CHEST  2 VIEW  COMPARISON:  04/10/2013  FINDINGS: The aorta is unfolded and ectatic. Mild moderate enlargement of the cardiac silhouette is noted. Central vascular congestion is identified with peripheral Kerley B-lines compatible with interstitial edema. Trace effusions are noted. Hyperinflation suggests emphysema. Atheromatous aortic calcification noted without calcified aneurysm.  IMPRESSION: Cardiomegaly with mild interstitial pulmonary edema and trace pleural effusions.    Dg Chest Port 1 View  06/19/2014   CLINICAL DATA:  Extubation.  EXAM: PORTABLE CHEST - 1 VIEW  COMPARISON:  05/29/2014.  FINDINGS: Right IJ line in stable position. Endotracheal tube has been removed. NG tube has been removed. Bibasilar atelectasis and left pleural effusion noted. Left lower lobe infiltrate cannot be excluded. No pneumothorax. Stable cardiomegaly. No acute osseus abnormality.  IMPRESSION: 1. Interim extubation and removal of NG tube. 2. Bibasilar atelectasis and left pleural effusion. Left lower lobe infiltrate cannot be excluded.  Dg Chest Port 1 View  06/18/2014  CLINICAL DATA:  Evaluate endotracheal tube  EXAM: PORTABLE CHEST - 1 VIEW  COMPARISON:  Prior chest x-ray 06/17/2014  FINDINGS: The endotracheal tube is 3.2 cm above the carina. Nasogastric tube in unchanged position. Tip lies below the diaphragm presumably within the stomach. Right IJ approach central venous catheter with the tip in the mid SVC. Metallic artifact overlies the chest in the form of multiple cardiac leads.  Stable cardiac and mediastinal contours with borderline cardiomegaly. Probable small layering left pleural effusion with associated atelectasis versus infiltrate. Background changes of COPD with pulmonary hyperexpansion, central bronchitic changes and diffuse mild  interstitial prominence. Minimal pulmonary vascular congestion without interstitial edema. No acute osseous abnormality. No pneumothorax.  IMPRESSION: 1. No significant interval change in the appearance of the chest over the last 24 hr. However, compared to 06/16/2014 there has been interval resolution of CHF. 2. Residual minimal pulmonary vascular congestion without edema and left basilar opacity favored to reflect a small layering effusion and atelectasis. Superimposed infiltrate difficult to ex .clude radiographically. 3. Stable and satisfactory support apparatus.     Dg Chest Port 1 View  06/17/2014   CLINICAL DATA:  Shortness of breath.  EXAM: PORTABLE CHEST - 1 VIEW  COMPARISON:  06/16/2014.  FINDINGS: Endotracheal tube, NG tube, right IJ line in stable position. Stable bibasilar atelectasis and/or infiltrates. No pleural effusion or pneumothorax. Heart size and pulmonary vascularity stable. No acute osseous abnormality.  IMPRESSION: 1. Stable line and tube positions. 2. Stable bibasilar atelectasis and/or infiltrates.     Dg Chest Port 1 View  06/16/2014   CLINICAL DATA:  EvaluateRUTLC  EXAM: PORTABLE CHEST - 1 VIEW  COMPARISON:  Chest radiograph 06/16/2014  FINDINGS: Interval intubation with endotracheal tube terminating within the mid trachea. Enteric tube courses inferior to the diaphragm, tip not included on this examination. Right IJ central venous catheter tip projects at the superior cavoatrial junction.  Stable cardiac and mediastinal contours. Slight interval improvement in bilateral interstitial pulmonary opacities. Persistent retrocardiac consolidative opacity. No definite pleural effusion or pneumothorax.  IMPRESSION: ET tube terminates in the mid trachea.  Enteric tube courses inferior to the diaphragm, tip not included on this examination.  Right IJ central venous catheter tip projects at the superior cavoatrial junction. No definite right-sided pneumothorax.  Persistent consolidation in  the retrocardiac region may represent atelectasis or infection.  Slight improvement parahilar interstitial opacities, potentially improving pulmonary edema.     Dg Chest Portable 1 View  06/16/2014   CLINICAL DATA:  Chest pain, shortness of breath.  EXAM: PORTABLE CHEST - 1 VIEW  COMPARISON:  Chest radiograph 06/16/2014.  FINDINGS: Stable cardiac and mediastinal contours. Interval increase in bilateral interstitial pulmonary opacities. Retrocardiac consolidation with obscuration of the left hemidiaphragm. No definite pleural effusion or pneumothorax.  IMPRESSION: Interval development of bilateral interstitial pulmonary opacities, suggestive of congestive heart failure.  Retrocardiac consolidative opacity favored to represent atelectasis. Infection not excluded.      Dg Abd Portable 1v  06/16/2014   CLINICAL DATA:  OG tube placement.  EXAM: PORTABLE ABDOMEN - 1 VIEW  COMPARISON:  CT abdomen and pelvis 10/11/2003.  FINDINGS: OG tube is in place with the tip in the body of the stomach. Left basilar airspace disease is noted.  IMPRESSION: NG tube tip is in the stomach.  Left basilar airspace disease.     2D ECHO: 06/17/2014 EF  LV EF: 50% - 55% Study Conclusions - Left ventricle: The cavity size was normal. Wall thickness was increased in a pattern  of mild LVH. Systolic function was normal. The estimated ejection fraction was in the range of 50% to 55%. Wall motion was normal; there were no regional wall motion abnormalities. Doppler parameters are consistent with abnormal left ventricular relaxation (grade 1 diastolic dysfunction). Doppler parameters are consistent with high ventricular filling pressure. - Aortic valve: There was mild regurgitation. - Mitral valve: Calcified annulus. Mildly thickened leaflets . There was mild regurgitation. - Tricuspid valve: There was moderate regurgitation. - Pulmonary arteries: PA peak pressure: 48 mm Hg (S).   ASSESSMENT AND PLAN  [name]  is a 80 y.o. female with a history of CAD s/p DES LCX 7/13, chronic systolic CHF, paroxysmal Afib, CKD stage IV, and hypertension who [facility] for pre-hydration for planned cardiac cath the following day.   CAD with recent NSTEMI- [facility] for pre-cath hydration.he underwent cardiac cath on 06/25/14 which revealed 3 V CAD with severe stenosis of the proximal LAD, severe stenosis of the mid left circumflex, continued patency of the stented segment in a large OM branch, and total occlusion of the right coronary artery with left-to-right collaterals. The patient has marked progression of her CAD from her last cardiac cath study 2 years ago. Dr. Excell Seltzerooper did not think that she had any percutaneous revascularization options unless they were to do an extensive PCI which clearly would result in a high likelihood of end-stage renal disease. Dr. Excell Seltzerooper had a lengthy discussion with the patient about treatment options. Options include CABG and multivessel PCI, both of which carry risk of progression of renal disease. Her current GFR is less than 20. She does not want to risk progression to hemodialysis under any circumstances. She favors palliative medical therapy at this point. She was offered a referral to [facility] for specific discussion of pros/cons of CABG but she was not interested.  She did establish a DNR code status. She was kept overnight to follow renal function and continue to monitor. Her creat is stable this AM at 2.55 and she is feeling well.  -- Continue medical therapy with ASA/ Plavix and BB. She has listed allergy to statin. Continue imdur.   Chronic combined systolic and diastolic CHF-- 2D ECHO: 06/17/2014 EF 50-55%, mild LVH. No WMAs. G1DD. Mild MR. Mild MR. Mod TR. PA pk pressure 48. -- She did report some DOE upon admission and was thought to be mildly volume overloaded on exam. She was started on IV Lasix and hydration was started early AM the day of her cardiac  cath.  -- Per Dr. Excell Seltzerooper; she should be discharged on maintenance diuretic (lasix 40 mg) as she gained significant weight prior to this admission when her diuretic was held.   Essential hypertension, benign - well controlled on Zebeta 5mg , hydralazine 25 bid and imdur 60mg  qd.  Hyperlipidemia - allergy to statin  CKD (chronic kidney disease), stage IV - creat stable at 2.55. Her creat was 2.34 on admission.   Recent ARDS - stable.  MD TO SEE. She can likely go home today. She is now DNR status and chooses to be treated medically.     Urban GibsonSigned, STERN, KATHRYN PA-C  Pager [phone]  I have examined the patient and reviewed assessment and plan and discussed with patient.  Agree with above as stated.  Medical therapy for significant CAD.  Multiple medical issues make invasive treatment difficult.    Quandra Fedorchak S.

## 2014-06-26 NOTE — Progress Notes (Signed)
D/c instructions reviewed with pt and her daughter. Copy of instructions given to pt. Pt d/c'd via wheelchair with belongings with daughter, escorted by hospital volunteer.

## 2014-06-26 NOTE — Discharge Instructions (Signed)

## 2014-07-01 ENCOUNTER — Encounter: Payer: Self-pay | Admitting: Nurse Practitioner

## 2014-07-01 ENCOUNTER — Telehealth: Payer: Self-pay | Admitting: *Deleted

## 2014-07-01 ENCOUNTER — Ambulatory Visit (INDEPENDENT_AMBULATORY_CARE_PROVIDER_SITE_OTHER): Payer: Medicare Other | Admitting: Nurse Practitioner

## 2014-07-01 VITALS — BP 130/58 | HR 64 | Ht 61.0 in | Wt 121.8 lb

## 2014-07-01 DIAGNOSIS — I25110 Atherosclerotic heart disease of native coronary artery with unstable angina pectoris: Secondary | ICD-10-CM

## 2014-07-01 DIAGNOSIS — I2510 Atherosclerotic heart disease of native coronary artery without angina pectoris: Secondary | ICD-10-CM

## 2014-07-01 DIAGNOSIS — I259 Chronic ischemic heart disease, unspecified: Secondary | ICD-10-CM

## 2014-07-01 DIAGNOSIS — N184 Chronic kidney disease, stage 4 (severe): Secondary | ICD-10-CM

## 2014-07-01 DIAGNOSIS — I200 Unstable angina: Secondary | ICD-10-CM

## 2014-07-01 LAB — BASIC METABOLIC PANEL
BUN: 43 mg/dL — ABNORMAL HIGH (ref 6–23)
CO2: 23 mEq/L (ref 19–32)
Calcium: 9.4 mg/dL (ref 8.4–10.5)
Chloride: 97 mEq/L (ref 96–112)
Creatinine, Ser: 3.4 mg/dL — ABNORMAL HIGH (ref 0.4–1.2)
GFR: 13.87 mL/min — CL (ref >60.00)
Glucose, Bld: 89 mg/dL (ref 70–99)
Potassium: 4.2 mEq/L (ref 3.5–5.1)
Sodium: 132 mEq/L — ABNORMAL LOW (ref 135–145)

## 2014-07-01 NOTE — Telephone Encounter (Signed)
Called pt to let pt know lab results were not in yet will call pt tomorrow

## 2014-07-01 NOTE — Progress Notes (Addendum)
[name] Date of Birth: [birth year] Medical Record #[id number]  History of Present Illness: [name] is seen back today for a one month check/post hospital visit. Seen for Dr. Excell Seltzer. She is an 80 year old female with diastolic HF, CAD and AF. She had a NSTEMI in 2013 and was treated with a DES to the OM of the LCX. She has had symptomatic AF with associated heart failure as well as ventilatory dependent respiratory failure. She is unable to take anticoagulation - has had severe allergic skin reaction to warfarin. Maintained on DAPT with aspirin and Plavix. She has a chronic LBBB on EKG. Known valvular heart disease as well and stage IV CKD - followed by Dr. Hyman Hopes.   Seen by me back in May by me. Having angina. BP was up. I added Imdur and Hydralazine.   Saw Dr. Excell Seltzer a few weeks later - still with angina but a little improved with the nitrate that I had started. Very reluctant to proceed with cardiac cath given her CKD stage 4 with GFR less than 20. Opting for medical management. Imdur was increased to 60 mg.   Last seen by me at the end of June. Still with angina but not ready for cardiac cath but considering. Would need to go in the night before for hydration and hold Lasix accordingly.   Since her last visit with me, she was admitted with respiratory failure from 7/20 to 7/24 - was intubated. Troponins elevated. Had a UTI. Echo showed EF of 50%. Ended up getting cathed with limited contrast. She was readmitted for hydration as well as diuresis for some volume overload. Cath showed marked progression of her CAD. Options include CABG and multivessel PCI - both carrying risk of progression of her renal disease. She has favored palliative medical therapy. She made herself a Herrera.   Comes back today. Here with her daughter. Has only been home a few days. No chest pain. Has a lot of upper back pain. Breathing ok. Anxious. Would like something for anxiety. Now considering CABG. Still does  not wish to pursue dialysis.   Current Outpatient Prescriptions  Medication Sig Dispense Refill  . aspirin EC 81 MG tablet Take 1 tablet (81 mg total) by mouth daily.      Marland Kitchen b complex vitamins tablet Take 1 tablet by mouth daily.        . bisoprolol (ZEBETA) 5 MG tablet Take 5 mg by mouth daily.      . Cholecalciferol (VITAMIN D-3) 5000 UNITS TABS Take 5,000 Units by mouth daily.       Marland Kitchen CINNAMON PO Take 1 capsule by mouth daily.      . clopidogrel (PLAVIX) 75 MG tablet Take 1 tablet (75 mg total) by mouth daily with breakfast.  30 tablet  6  . fish oil-omega-3 fatty acids 1000 MG capsule Take 1,300 g by mouth 2 (two) times daily.       . furosemide (LASIX) 40 MG tablet Take 1 tablet (40 mg total) by mouth daily.  30 tablet  11  . guaifenesin (ROBITUSSIN) 100 MG/5ML syrup Take 100 mg by mouth 3 (three) times daily as needed for cough.      . hydrALAZINE (APRESOLINE) 25 MG tablet Take 1 tablet (25 mg total) by mouth 2 (two) times daily.  60 tablet  3  . isosorbide mononitrate (IMDUR) 60 MG 24 hr tablet Take 1 tablet (60 mg total) by mouth daily.  30 tablet  3  .  loratadine (CLARITIN) 10 MG tablet Take 10 mg by mouth daily.      . Multiple Vitamins-Minerals (MULTIVITAMIN WITH MINERALS) tablet Take 1 tablet by mouth daily as needed (supplementation).      . nitroGLYCERIN (NITROSTAT) 0.4 MG SL tablet Place 1 tablet (0.4 mg total) under the tongue every 5 (five) minutes x 3 doses as needed for chest pain.  25 tablet  4   No current facility-administered medications for this visit.    Allergies  Allergen Reactions  . Metoprolol Swelling    Takes bisoprolol without issue  . Norvasc [Amlodipine Besylate] Swelling    Takes nisoldipine without issue  . Sulfonamide Derivatives Hives, Itching and Swelling  . Amiodarone Nausea And Vomiting  . Medrol [Methylprednisolone]     Fluid retention/CHF exacerbation - caution with steroids in future if ever needed  . Statins     Myalgias - "I won't take  them"  . Coumadin [Warfarin Sodium] Rash  . Latex Itching    Past Medical History  Diagnosis Date  . CKD (chronic kidney disease), stage IV     a. baseline creat previously 1.6->2 - elevated during 03/2013 adm.  Marland Kitchen HTN (hypertension)   . Anxiety   . History of shingles July 2004    on the left side of her thorax and postherpetic neuralgia  . History of psoriasis   . History of recurrent UTIs   . CAD (coronary artery disease)     a. [facility] 06/25/14 with severe 3V dz requiring multi-vessel PCI or CABG- pt refusese both. Treat medically. b. NSTEMI 05/2012 s/p DES to OM (residual severe LCx subbranch dz for med rx unless refractory angina recurs) c. NSTEMI 03/2013 felt 2/2 demand in setting of resp failure, for med rx.  Marland Kitchen LBBB (left bundle branch block)   . Chronic combined systolic and diastolic CHF (congestive heart failure)     a. ECHO: 06/17/2014 EF 50-55%, mild LVH. No WMAs. G1DD. Mild MR. Mild MR. Mod TR. PA pk pressure 48.  Marland Kitchen PAF (paroxysmal atrial fibrillation)     a. Dx 07/2012 -> Amio started but later dc'd 2/2 nausea/NSR. b. 02/2013 Coumadin initiated, dc'd 03/2013 2/2 allergic reaction. c. Ultimately not on NOAC due to age, Coumadin allergy and low CrCl.  Marland Kitchen Respiratory failure     a. Requiring intubation 03/2013, secondary to cardiogenic pulmonary edema.  Marland Kitchen HLD (hyperlipidemia)     a. statin intolerant per prior notes    Past Surgical History  Procedure Laterality Date  . Salpingoophorectomy      "cyst removed"  . Appendectomy    . Cataract extraction, bilateral Bilateral ~ 2010  . Coronary angioplasty with stent placement  ~ 07/2012    "1"  . Abdominal hysterectomy      "partial"  . Breast cyst excision Bilateral     "total of 4-5"    History  Smoking status  . Former Smoker -- 1.50 packs/day for 30 years  . Types: Cigarettes  . Quit date: 09/01/1979  Smokeless tobacco  . Never Used    History  Alcohol Use  . Yes    Comment: 06/19/2014  "none in the last 3-4 years""      Family History  Problem Relation Age of Onset  . Diabetes    . Cancer    . Lung cancer      Review of Systems: The review of systems is per the HPI.  All other systems were reviewed and are negative.  Physical Exam: BP 130/58  Pulse 64  Ht 5\' 1"  (1.549 m)  Wt 121 lb 12.8 oz (55.248 kg)  BMI 23.03 kg/m2  SpO2 97% Deanna is very pleasant and in no acute distress. Looks frail. Skin is warm and dry. Color is normal.  HEENT is unremarkable. Normocephalic/atraumatic. PERRL. Sclera are nonicteric. Neck is supple. No masses. No JVD. Lungs are clear. Cardiac exam shows a regular rate and rhythm. Abdomen is soft. Extremities are without edema. Gait and ROM are intact. No gross neurologic deficits noted.  Wt Readings from Last 3 Encounters:  07/01/14 121 lb 12.8 oz (55.248 kg)  06/26/14 120 lb 9.6 oz (54.704 kg)  06/26/14 120 lb 9.6 oz (54.704 kg)    LABORATORY DATA/PROCEDURES:  Lab Results  Component Value Date   WBC 8.7 06/24/2014   HGB 8.5* 06/24/2014   HCT 25.9* 06/24/2014   PLT 333 06/24/2014   GLUCOSE 125* 06/26/2014   CHOL 223* 03/16/2013   TRIG 119 03/16/2013   HDL 59 03/16/2013   LDLCALC 140* 03/16/2013   ALT 10 06/16/2014   AST 19 06/16/2014   NA 133* 06/26/2014   K 4.8 06/26/2014   CL 95* 06/26/2014   CREATININE 2.62* 06/26/2014   BUN 29* 06/26/2014   CO2 23 06/26/2014   TSH 1.429 03/15/2013   INR 0.99 06/25/2014    BNP (last 3 results)  Recent Labs  06/16/14 0751  PROBNP 4799.0*    Cardiac Catheterization Procedure Note  Name: [name]  MRN: [id number]  DOB: 09/22/1930  Procedure: Left Heart Cath, Selective Coronary Angiography  Indication: Progressive angina, CCS Class 3 despite maximal medical therapy.  Procedural Details: The right wrist was prepped, draped, and anesthetized with 1% lidocaine. Using the modified Seldinger technique, a 5 French sheath was introduced into the right radial artery. 3 mg of verapamil was administered through the sheath,  weight-based unfractionated heparin was administered intravenously. Standard Judkins catheters were used for selective coronary angiography. Left ventriculography was deferred because of chronic kidney disease. Catheter exchanges were performed over an exchange length guidewire. There were no immediate procedural complications. A TR band was used for radial hemostasis at the completion of the procedure. The Deanna was transferred to the post catheterization recovery area for further monitoring.  Procedural Findings:  Hemodynamics:  AO 132/49  LV 132/8  Coronary angiography:  Coronary dominance: right  Left mainstem: The left main short. The vessel is patent without obstructive disease.  Left anterior descending (LAD): The LAD is heavily calcified. There is an eccentric 80% stenosis in the proximal vessel before the first septal perforator. The mid and distal vessel are patent with mild to moderate diffuse disease noted. The diagonal branch is relatively small without significant obstruction  Left circumflex (LCx): The circumflex is patent. The stented segment in the first obtuse marginal is widely patent without in-stent restenosis. The OM branch is tortuous and it divides into twin vessels. The branch vessels of the OM are patent with 50% stenosis in the more distal branch. The AV circumflex just beyond the obtuse marginal branch has 80% stenosis. The third OM branch has diffuse 75% stenosis.  Right coronary artery (RCA): The RCA has severe diffuse disease with multiple 90% stenoses in the proximal and mid vessel leading into a total occlusion in the distal vessel. The distal branch vessels of the RCA filled from left to right collaterals.  Left ventriculography: Deferred because of advanced chronic kidney disease  Final Conclusions:  3 vessel coronary artery disease with severe stenosis of the proximal  LAD, severe stenosis of the mid left circumflex, continued patency of the stented segment in a  large OM branch, and total occlusion of the right coronary artery with left-to-right collaterals.  Recommendations: The Deanna has marked progression of her coronary artery disease from her last cardiac cath study 2 years ago. I don't think she has any to percutaneous revascularization options unless we were to do extensive PCI which clearly would result in a high likelihood of end-stage renal disease. Will review treatment options. She has had respiratory failure now on multiple occasions requiring mechanical ventilation. She is now a DO NOT RESUSCITATE. I do not think she would want to consider coronary bypass surgery. Will review at length with the Deanna and her family.  Tonny Bollman  06/25/2014, 1:44 PM    Echo Study Conclusions from July 2015  - Left ventricle: The cavity size was normal. Wall thickness was increased in a pattern of mild LVH. Systolic function was normal. The estimated ejection fraction was in the range of 50% to 55%. Wall motion was normal; there were no regional wall motion abnormalities. Doppler parameters are consistent with abnormal left ventricular relaxation (grade 1 diastolic dysfunction). Doppler parameters are consistent with high ventricular filling pressure. - Aortic valve: There was mild regurgitation. - Mitral valve: Calcified annulus. Mildly thickened leaflets . There was mild regurgitation. - Tricuspid valve: There was moderate regurgitation. - Pulmonary arteries: PA peak pressure: 48 mm Hg (S).    Assessment / Plan: 1. Progressive CAD - with recent cath/NSTEMI - she and her daughter would like to discuss the option of surgery - she still may not proceed and may not even be a candidate. Looks frail. No change in current regimen. I did approach the idea of Hospice/palliative care as well. [name]. Maintained on DAPT with aspirin and Plavix.  2. Recent respiratory failure requiring intubation  3. CKD - followed by Dr. Hyman Hopes   4. HTN - BP  of for now. Continue with current regimen.   5. Anxiety - wanting some low dose Xanax - has used in the past. Will ask Dr. Excell Seltzer.   6. PAF - severe allergic reaction in the past to coumadin  I will see her back in a month. I have called in a consult for Dr. Laneta Simmers or Dr. Tyrone Sage for consideration of CABG. Work note given to the daughter for the week of July 20th.  Recheck BMET today.   Deanna is agreeable to this plan and will call if any problems develop in the interim.   Rosalio Macadamia, RN, ANP-C [facility] [address] [place], [place]  [address] [phone]   Addendum: Message from Dr. Excell Seltzer - 07/02/14 No I agree with plan. I had a long one-on-one chat with her the evening before she left the hospital. I think her daughter is driving this. Lenni is ready for a palliative approach to her care. Meeting with Judie Grieve or Ed will be good and maybe give them peace of mind  Previous Messages     ----- Message -----  From: Rosalio Macadamia, NP  Sent: 07/01/2014 2:16 PM  To: Tonny Bollman, MD   Dr. Excell Seltzer   I saw Andrienne today - frail.   She is wanting to "talk" to the surgeon. I have requested a consult with Dr. Laneta Simmers or with Ed - I think they would most gently turn her down.   I did recheck her BMET today - of course  kidney function is worse - I do not think there is really anything to do. Will send to Dr. Hyman Hopes.   Suggestions?   Lawson Fiscal

## 2014-07-01 NOTE — Patient Instructions (Addendum)
We will arrange for a visit with the cardiothoracic surgeon to discuss bypass surgery -  Their office([facility])  will be calling you later today or tomorrow with an appointment   Stay on your current medicines for now  I will talk with Dr. Excell Seltzerooper about some low dose Xanax for you  See me in one month  We are going to recheck your kidney function today  Call the [facility] office at [phone] if you have any questions, problems or concerns.

## 2014-07-03 ENCOUNTER — Encounter: Payer: Self-pay | Admitting: Surgery

## 2014-07-03 ENCOUNTER — Institutional Professional Consult (permissible substitution) (INDEPENDENT_AMBULATORY_CARE_PROVIDER_SITE_OTHER): Payer: Medicare Other | Admitting: Surgery

## 2014-07-03 VITALS — BP 151/67 | HR 66 | Ht 61.0 in | Wt 121.0 lb

## 2014-07-03 DIAGNOSIS — I2510 Atherosclerotic heart disease of native coronary artery without angina pectoris: Secondary | ICD-10-CM

## 2014-07-03 DIAGNOSIS — I25119 Atherosclerotic heart disease of native coronary artery with unspecified angina pectoris: Secondary | ICD-10-CM

## 2014-07-03 DIAGNOSIS — I209 Angina pectoris, unspecified: Secondary | ICD-10-CM

## 2014-07-03 NOTE — Progress Notes (Signed)
Cardiothoracic Surgery Consultation   PCP is [name], MD Referring Provider is Tonny Bollman, MD  Chief Complaint  Patient presents with  . NEW CARDIAC    EVAL FOR CABG    HPI:  The patient is an 80 year old woman with a history of coronary artery disease, atrial fibrillation, diastolic heart failure, and stage IV CKD followed by Dr. Hyman Hopes. She has a NSTEMI in 2013 and was treated with a DES to the OM. Over the past several months she has had persistent angina despite increasing her medications. She was reluctant to have a cath due to the risk of worsening renal failure with a GFR less than 20. She was admitted from 7/20-7/24 with respiratory failure requiring intubation. Troponin was elevated. An echo showed an EF of 50%. She underwent cath post-discharge on 7/29 and this showed marked progression of her CAD with high grade LAD and LCX stenoses and a diffusely diseased RCA that was occluded in the mid portion with left to right collaterals filling the PDA. PCI was felt to be high risk with likely worsening renal function. CABG was also felt to be high risk due to her renal failure and she decided to continue palliative medical therapy and made herself a DNR. She and her daughter wanted to discuss her case with a surgeon so she was referred to me today. Since her discharge she says she has felt fairly well. She has not had any of the chest burning that she had before. She denies dyspnea. She says she did develop some leg edema when off her lasix around the time of the cath. She says she has been driving.   Past Medical History  Diagnosis Date  . CKD (chronic kidney disease), stage IV     a. baseline creat previously 1.6->2 - elevated during 03/2013 adm.  Marland Kitchen HTN (hypertension)   . Anxiety   . History of shingles July 2004    on the left side of her thorax and postherpetic neuralgia  . History of psoriasis   . History of recurrent UTIs   . CAD (coronary artery disease)     a.  [facility] 06/25/14 with severe 3V dz requiring multi-vessel PCI or CABG- pt refusese both. Treat medically. b. NSTEMI 05/2012 s/p DES to OM (residual severe LCx subbranch dz for med rx unless refractory angina recurs) c. NSTEMI 03/2013 felt 2/2 demand in setting of resp failure, for med rx.  Marland Kitchen LBBB (left bundle branch block)   . Chronic combined systolic and diastolic CHF (congestive heart failure)     a. ECHO: 06/17/2014 EF 50-55%, mild LVH. No WMAs. G1DD. Mild MR. Mild MR. Mod TR. PA pk pressure 48.  Marland Kitchen PAF (paroxysmal atrial fibrillation)     a. Dx 07/2012 -> Amio started but later dc'd 2/2 nausea/NSR. b. 02/2013 Coumadin initiated, dc'd 03/2013 2/2 allergic reaction. c. Ultimately not on NOAC due to age, Coumadin allergy and low CrCl.  Marland Kitchen Respiratory failure     a. Requiring intubation 03/2013, secondary to cardiogenic pulmonary edema.  Marland Kitchen HLD (hyperlipidemia)     a. statin intolerant per prior notes    Past Surgical History  Procedure Laterality Date  . Salpingoophorectomy      "cyst removed"  . Appendectomy    . Cataract extraction, bilateral Bilateral ~ 2010  . Coronary angioplasty with stent placement  ~ 07/2012    "1"  . Abdominal hysterectomy      "partial"  . Breast cyst excision Bilateral     "  total of 4-5"    Family History  Problem Relation Age of Onset  . Diabetes    . Cancer    . Lung cancer      Social History History  Substance Use Topics  . Smoking status: Former Smoker -- 1.50 packs/day for 30 years    Types: Cigarettes    Quit date: 09/01/1979  . Smokeless tobacco: Never Used  . Alcohol Use: Yes     Comment: 06/19/2014  "none in the last 3-4 years""    Current Outpatient Prescriptions  Medication Sig Dispense Refill  . aspirin EC 81 MG tablet Take 1 tablet (81 mg total) by mouth daily.      Marland Kitchen b complex vitamins tablet Take 1 tablet by mouth daily.        . bisoprolol (ZEBETA) 5 MG tablet Take 5 mg by mouth daily.      . Cholecalciferol (VITAMIN D-3) 5000 UNITS TABS  Take 5,000 Units by mouth daily.       Marland Kitchen CINNAMON PO Take 1 capsule by mouth daily.      . clopidogrel (PLAVIX) 75 MG tablet Take 1 tablet (75 mg total) by mouth daily with breakfast.  30 tablet  6  . fish oil-omega-3 fatty acids 1000 MG capsule Take 1,300 g by mouth 2 (two) times daily.       . furosemide (LASIX) 40 MG tablet Take 1 tablet (40 mg total) by mouth daily.  30 tablet  11  . hydrALAZINE (APRESOLINE) 25 MG tablet Take 1 tablet (25 mg total) by mouth 2 (two) times daily.  60 tablet  3  . isosorbide mononitrate (IMDUR) 60 MG 24 hr tablet Take 1 tablet (60 mg total) by mouth daily.  30 tablet  3  . loratadine (CLARITIN) 10 MG tablet Take 10 mg by mouth daily.      . Multiple Vitamins-Minerals (MULTIVITAMIN WITH MINERALS) tablet Take 1 tablet by mouth daily as needed (supplementation).      . nitroGLYCERIN (NITROSTAT) 0.4 MG SL tablet Place 1 tablet (0.4 mg total) under the tongue every 5 (five) minutes x 3 doses as needed for chest pain.  25 tablet  4   No current facility-administered medications for this visit.    Allergies  Allergen Reactions  . Metoprolol Swelling    Takes bisoprolol without issue  . Norvasc [Amlodipine Besylate] Swelling    Takes nisoldipine without issue  . Sulfonamide Derivatives Hives, Itching and Swelling  . Amiodarone Nausea And Vomiting  . Medrol [Methylprednisolone]     Fluid retention/CHF exacerbation - caution with steroids in future if ever needed  . Statins     Myalgias - "I won't take them"  . Coumadin [Warfarin Sodium] Rash  . Latex Itching    Review of Systems  Constitutional: Positive for appetite change, fatigue and unexpected weight change.       Weight loss  HENT: Negative.   Eyes: Negative.   Respiratory: Positive for cough. Negative for chest tightness and shortness of breath.   Cardiovascular: Positive for leg swelling. Negative for chest pain.       When off lasix  Gastrointestinal: Positive for diarrhea.       Black stools    Endocrine: Negative.   Genitourinary: Negative.   Musculoskeletal: Negative.   Skin: Negative.   Allergic/Immunologic: Negative.   Neurological: Negative.   Hematological: Negative.   Psychiatric/Behavioral: The patient is nervous/anxious.     BP 151/67  Pulse 66  Ht 5\' 1"  (1.549  m)  Wt 121 lb (54.885 kg)  BMI 22.87 kg/m2  SpO2 97% Physical Exam  Constitutional: She is oriented to person, place, and time.  Elderly, frail woman in no distress   HENT:  Head: Normocephalic and atraumatic.  Mouth/Throat: Oropharynx is clear and moist.  Eyes: EOM are normal. Pupils are equal, round, and reactive to light.  Neck: Normal range of motion. No JVD present. No thyromegaly present.  Cardiovascular: Normal rate and regular rhythm.   Murmur heard. Harsh 2/6 systolic murmur along RSB  Pulmonary/Chest: Effort normal and breath sounds normal. No respiratory distress. She has no wheezes. She has no rales.  Abdominal: Soft. Bowel sounds are normal. She exhibits no distension and no mass. There is no tenderness.  Musculoskeletal: She exhibits no edema.  Lymphadenopathy:    She has no cervical adenopathy.  Neurological: She is alert and oriented to person, place, and time. She has normal strength. No cranial nerve deficit or sensory deficit.  Skin: Skin is warm and dry.  Psychiatric: She has a normal mood and affect.     Diagnostic Tests:  Cardiac Catheterization Procedure Note  Name: SHELLA LAHMAN  MRN: [id number]  DOB: 04-11-1930  Procedure: Left Heart Cath, Selective Coronary Angiography  Indication: Progressive angina, CCS Class 3 despite maximal medical therapy.  Procedural Details: The right wrist was prepped, draped, and anesthetized with 1% lidocaine. Using the modified Seldinger technique, a 5 French sheath was introduced into the right radial artery. 3 mg of verapamil was administered through the sheath, weight-based unfractionated heparin was administered intravenously.  Standard Judkins catheters were used for selective coronary angiography. Left ventriculography was deferred because of chronic kidney disease. Catheter exchanges were performed over an exchange length guidewire. There were no immediate procedural complications. A TR band was used for radial hemostasis at the completion of the procedure. The patient was transferred to the post catheterization recovery area for further monitoring.  Procedural Findings:  Hemodynamics:  AO 132/49  LV 132/8  Coronary angiography:  Coronary dominance: right  Left mainstem: The left main short. The vessel is patent without obstructive disease.  Left anterior descending (LAD): The LAD is heavily calcified. There is an eccentric 80% stenosis in the proximal vessel before the first septal perforator. The mid and distal vessel are patent with mild to moderate diffuse disease noted. The diagonal branch is relatively small without significant obstruction  Left circumflex (LCx): The circumflex is patent. The stented segment in the first obtuse marginal is widely patent without in-stent restenosis. The OM branch is tortuous and it divides into twin vessels. The branch vessels of the OM are patent with 50% stenosis in the more distal branch. The AV circumflex just beyond the obtuse marginal branch has 80% stenosis. The third OM branch has diffuse 75% stenosis.  Right coronary artery (RCA): The RCA has severe diffuse disease with multiple 90% stenoses in the proximal and mid vessel leading into a total occlusion in the distal vessel. The distal branch vessels of the RCA filled from left to right collaterals.  Left ventriculography: Deferred because of advanced chronic kidney disease  Final Conclusions:  3 vessel coronary artery disease with severe stenosis of the proximal LAD, severe stenosis of the mid left circumflex, continued patency of the stented segment in a large OM branch, and total occlusion of the right coronary artery with  left-to-right collaterals.  Recommendations: The patient has marked progression of her coronary artery disease from her last cardiac cath study 2 years ago. I don't  think she has any to percutaneous revascularization options unless we were to do extensive PCI which clearly would result in a high likelihood of end-stage renal disease. Will review treatment options. She has had respiratory failure now on multiple occasions requiring mechanical ventilation. She is now a DO NOT RESUSCITATE. I do not think she would want to consider coronary bypass surgery. Will review at length with the patient and her family.  Tonny Bollman  06/25/2014, 1:44 PM    *[place]* *[facility]* [address] [place], [place] [address] [phone]  ------------------------------------------------------------------- Transthoracic Echocardiography  Patient: Paulene, Tayag MR #: [id number] Study Date: 06/17/2014 Gender: F Age: 89 Height: 160 cm Weight: 58.1 kg BSA: 1.61 m^2 Pt. Status: Room: 2M02C  [facility] PERFORMING [facility], Inpatient SONOGRAPHER Leta Jungling, RDCS  cc:  ------------------------------------------------------------------- LV EF: 50% - 55%  ------------------------------------------------------------------- Indications: Chest pain 786.51. MI - follow-up 410.92.  ------------------------------------------------------------------- History: PMH: Unstable Angina. Carotid Bruit. Pain. Murmur. Coronary artery disease. PMH: Non-STEMI. Risk factors: HDL.  ------------------------------------------------------------------- Study Conclusions  - Left ventricle: The cavity size was normal. Wall thickness was increased in a pattern of mild LVH. Systolic function was normal. The estimated ejection fraction was in the range of 50% to 55%. Wall motion was normal; there were no regional wall motion abnormalities. Doppler parameters are consistent with  abnormal left ventricular relaxation (grade 1 diastolic dysfunction). Doppler parameters are consistent with high ventricular filling pressure. - Aortic valve: There was mild regurgitation. - Mitral valve: Calcified annulus. Mildly thickened leaflets . There was mild regurgitation. - Tricuspid valve: There was moderate regurgitation. - Pulmonary arteries: PA peak pressure: 48 mm Hg (S).  Transthoracic echocardiography. M-mode, complete 2D, spectral Doppler, and color Doppler. Birthdate: Patient birthdate: 05-Oct-1930. Age: Patient is 80 yr old. Sex: Gender: female. Height: Height: 160 cm. Height: 63 in. Weight: Weight: 58.1 kg. Weight: 127.8 lb. Body mass index: BMI: 22.7 kg/m^2. Body surface area: BSA: 1.61 m^2. Blood pressure: 154/60 Patient status: Inpatient. Study date: Study date: 06/17/2014. Study time: 12:02 PM. Location: Bedside.  -------------------------------------------------------------------  ------------------------------------------------------------------- Left ventricle: The cavity size was normal. Wall thickness was increased in a pattern of mild LVH. Systolic function was normal. The estimated ejection fraction was in the range of 50% to 55%. Wall motion was normal; there were no regional wall motion abnormalities. Doppler parameters are consistent with abnormal left ventricular relaxation (grade 1 diastolic dysfunction). Doppler parameters are consistent with high ventricular filling pressure.  ------------------------------------------------------------------- Aortic valve: Mildly thickened, mildly calcified leaflets. Sclerosis without stenosis. Doppler: There was mild regurgitation. Peak gradient (S): 15 mm Hg.  ------------------------------------------------------------------- Aorta: The aorta was normal, not dilated, and non-diseased.  ------------------------------------------------------------------- Mitral valve: Calcified annulus. Mildly thickened  leaflets . Doppler: There was mild regurgitation. Peak gradient (D): 7 mm Hg.  ------------------------------------------------------------------- Left atrium: The atrium was normal in size.  ------------------------------------------------------------------- Atrial septum: Poorly visualized.  ------------------------------------------------------------------- Right ventricle: The cavity size was normal. Wall thickness was normal. Systolic function was normal.  ------------------------------------------------------------------- Pulmonic valve: The valve appears to be grossly normal. Doppler: There was mild regurgitation.  ------------------------------------------------------------------- Tricuspid valve: Normal thickness leaflets. Doppler: There was moderate regurgitation.  ------------------------------------------------------------------- Right atrium: The atrium was normal in size.  ------------------------------------------------------------------- Pericardium: There was no pericardial effusion.  ------------------------------------------------------------------- Post procedure conclusions Ascending Aorta:  - The aorta was normal, not dilated, and non-diseased.  ------------------------------------------------------------------- Prepared and Electronically Authenticated by  Cassell Clement 2015-07-21T17:39:23  ------------------------------------------------------------------- Measurements  Left ventricle Value 04/24/2014 Reference LV ID, ED, PLAX  chordal (L) 39.4 mm 52.5 43 - 52 LV ID, ES, PLAX chordal (N) 28.3 mm 39.3 23 - 38 LV fx shortening, PLAX (L) 28 % 25 >=29 chordal LV PW thickness, ED 13.6 mm 11.6 --------- IVS/LV PW ratio, ED (N) 0.96 1 <=1.3 LV e&', lateral 4.28 cm/s 5.18 --------- LV E/e&', lateral 31.07 26.64 --------- LV e&', medial 3.4 cm/s 3.77 --------- LV E/e&', medial 39.12 36.6 --------- LV e&', average 3.84 cm/s 4.48 --------- LV  E/e&', average 34.64 30.84 ---------  Ventricular septum Value 04/24/2014 Reference IVS thickness, ED 13.1 mm 11.6 ---------  Aortic valve Value 04/24/2014 Reference Aortic valve peak 196 cm/s 198 --------- velocity, S Aortic peak gradient, S 15 mm Hg 16 --------- Aortic regurg pressure 351 ms 382 --------- half-time  Aorta Value 04/24/2014 Reference Aortic root ID, ED 29 mm 33 ---------  Left atrium Value 04/24/2014 Reference LA ID, A-P, ES 30 mm 33 --------- LA ID/bsa, A-P (N) 1.86 cm/m^2 2.08 <=2.2  Mitral valve Value 04/24/2014 Reference Mitral E-wave peak 133 cm/s 138 --------- velocity Mitral A-wave peak 146 cm/s 126 --------- velocity Mitral deceleration time (N) 208 ms 268 150 - 230 Mitral peak gradient, D 7 mm Hg 8 --------- Mitral E/A ratio, peak 0.9 1.1 ---------  Pulmonary arteries Value 04/24/2014 Reference PA pressure, S, DP (H) 48 mm Hg 47 <=30  Tricuspid valve Value 04/24/2014 Reference Tricuspid regurg peak 316 cm/s 302 --------- velocity Tricuspid peak RV-RA 40 mm Hg 36 --------- gradient Tricuspid maximal regurg 316 cm/s 302 --------- velocity, PISA  Systemic veins Value 04/24/2014 Reference Estimated CVP 8 mm Hg ---------- ---------  Right ventricle Value 04/24/2014 Reference RV pressure, S, DP (H) 48 mm Hg ---------- <=30 RV s&', lateral, S 12.7 cm/s 14.3 ---------  Legend: (L) and (H) mark values outside specified reference range.  (N) marks values inside specified reference range.    Impression:  She is an 80 year old with severe multi-vessel CAD and stage IV CKD with a recent admission for acute respiratory failure requiring intubation. Her risk for permanent need for dialysis with surgery would be at least 50%. Her creatinine went from 2.6 pre-cath on 7/30 to 3.4 on 8/4 after cath. I think her heart would be in much better condition with surgery but she would likely end up on HD and going to a SNF for a long time, maybe permanently at  her age. She certainly would not be independent any longer and her quality of life would decrease. At the present time she feels pretty well. I had a long discussion with her and her daughter and she would like to continue medical therapy at this time which I think is probably the right decision for her. She does not want to be on dialysis and does not want to be dependent.   Plan:  She will continue to follow up with cardiology. I told her I would be happy to see her back if any further questions arise.

## 2014-07-07 ENCOUNTER — Other Ambulatory Visit: Payer: Self-pay | Admitting: *Deleted

## 2014-07-07 DIAGNOSIS — N184 Chronic kidney disease, stage 4 (severe): Secondary | ICD-10-CM

## 2014-07-11 ENCOUNTER — Other Ambulatory Visit (INDEPENDENT_AMBULATORY_CARE_PROVIDER_SITE_OTHER): Payer: Medicare Other

## 2014-07-11 DIAGNOSIS — N184 Chronic kidney disease, stage 4 (severe): Secondary | ICD-10-CM

## 2014-07-11 LAB — BASIC METABOLIC PANEL
BUN: 51 mg/dL — ABNORMAL HIGH (ref 6–23)
CO2: 27 mEq/L (ref 19–32)
Calcium: 10.1 mg/dL (ref 8.4–10.5)
Chloride: 94 mEq/L — ABNORMAL LOW (ref 96–112)
Creatinine, Ser: 3.1 mg/dL — ABNORMAL HIGH (ref 0.4–1.2)
GFR: 15.44 mL/min — ABNORMAL LOW (ref >60.00)
Glucose, Bld: 121 mg/dL — ABNORMAL HIGH (ref 70–99)
Potassium: 4.4 mEq/L (ref 3.5–5.1)
Sodium: 131 mEq/L — ABNORMAL LOW (ref 135–145)

## 2014-07-14 ENCOUNTER — Other Ambulatory Visit: Payer: Self-pay | Admitting: *Deleted

## 2014-07-14 DIAGNOSIS — N184 Chronic kidney disease, stage 4 (severe): Secondary | ICD-10-CM

## 2014-07-18 ENCOUNTER — Other Ambulatory Visit (INDEPENDENT_AMBULATORY_CARE_PROVIDER_SITE_OTHER): Payer: Medicare Other

## 2014-07-18 DIAGNOSIS — N184 Chronic kidney disease, stage 4 (severe): Secondary | ICD-10-CM

## 2014-07-19 LAB — BASIC METABOLIC PANEL
BUN: 46 mg/dL — ABNORMAL HIGH (ref 6–23)
CO2: 30 mEq/L (ref 19–32)
Calcium: 8.8 mg/dL (ref 8.4–10.5)
Chloride: 97 mEq/L (ref 96–112)
Creatinine, Ser: 3 mg/dL — ABNORMAL HIGH (ref 0.4–1.2)
GFR: 15.92 mL/min — ABNORMAL LOW (ref >60.00)
Glucose, Bld: 88 mg/dL (ref 70–99)
Potassium: 3.6 mEq/L (ref 3.5–5.1)
Sodium: 135 mEq/L (ref 135–145)

## 2014-08-05 ENCOUNTER — Other Ambulatory Visit (HOSPITAL_COMMUNITY): Payer: Self-pay | Admitting: Cardiovascular Disease

## 2014-08-15 ENCOUNTER — Encounter: Payer: Self-pay | Admitting: Nurse Practitioner

## 2014-08-15 ENCOUNTER — Ambulatory Visit (INDEPENDENT_AMBULATORY_CARE_PROVIDER_SITE_OTHER): Payer: Medicare Other | Admitting: Nurse Practitioner

## 2014-08-15 VITALS — BP 138/60 | HR 61 | Ht 61.0 in | Wt 126.8 lb

## 2014-08-15 DIAGNOSIS — I10 Essential (primary) hypertension: Secondary | ICD-10-CM

## 2014-08-15 DIAGNOSIS — I259 Chronic ischemic heart disease, unspecified: Secondary | ICD-10-CM

## 2014-08-15 DIAGNOSIS — N184 Chronic kidney disease, stage 4 (severe): Secondary | ICD-10-CM

## 2014-08-15 DIAGNOSIS — I2510 Atherosclerotic heart disease of native coronary artery without angina pectoris: Secondary | ICD-10-CM

## 2014-08-15 MED ORDER — HYDRALAZINE HCL 25 MG PO TABS: 12.5000 mg | ORAL_TABLET | Freq: Two times a day (BID) | ORAL | Status: DC

## 2014-08-15 NOTE — Patient Instructions (Addendum)
Stay on your current medicines but we are cutting the Hydralazine to just a half a pill (12.5 mg) twice a day  Continue to monitor your blood pressure - call me if it is staying low  I will see you in April of 2016 - but please call me/Dr. Excell Seltzer if you need anything before then  Call the [facility] office at [phone] if you have any questions, problems or concerns.

## 2014-08-15 NOTE — Progress Notes (Signed)
[name] Date of Birth: 18-Jan-1930 Medical Record #[id number]  History of Present Illness: [name] is seen back today for a 6 week visit. Seen for [name]. She is an 80 year old female with diastolic HF, CAD and AF. She had a NSTEMI in 2013 and was treated with a DES to the OM of the LCX. She has had symptomatic AF with associated heart failure as well as ventilatory dependent respiratory failure. She is unable to take anticoagulation - has had severe allergic skin reaction to warfarin. Maintained on DAPT with aspirin and Plavix. She has a chronic LBBB on EKG. Known valvular heart disease as well and stage IV CKD - followed by [name].   Seen by me back in May of 2015 by me. Having angina. BP was up. I added Imdur and Hydralazine. Subsequently failed on medical therapy after being admitted with resspiratory failure from 7/20 to 7/24 - was intubated. Troponins elevated. Had a UTI. Echo showed EF of 50%. Ended up getting cathed with limited contrast. She was readmitted for hydration as well as diuresis for some volume overload. Cath showed marked progression of her CAD. Options include CABG and multivessel PCI - both carrying risk of progression of her renal disease. She has favored palliative medical therapy. She made herself a DNR.   I saw her back 6 weeks ago - was then actually considering CABG - sent to [facility] just for a consult - saw [name] - "Impression: She is an 80 year old with severe multi-vessel CAD and stage IV CKD with a recent admission for acute respiratory failure requiring intubation. Her risk for permanent need for dialysis with surgery would be at least 50%. Her creatinine went from 2.6 pre-cath on 7/30 to 3.4 on 8/4 after cath. I think her heart would be in much better condition with surgery but she would likely end up on HD and going to a SNF for a long time, maybe permanently at her age. She certainly would not be independent any longer and her quality of life  would decrease. At the present time she feels pretty well. I had a long discussion with her and her daughter and she would like to continue medical therapy at this time which I think is probably the right decision for her. She does not want to be on dialysis and does not want to be dependent. Plan: She will continue to follow up with cardiology. I told her I would be happy to see her back if any further questions arise."  Comes back today. Here with her daughter. Doing ok. Just "weak". BP running in the 100's at home. Little lightheaded with this. She has been holding doses of hydralazine several times a week. No real spells of angina.   Current Outpatient Prescriptions  Medication Sig Dispense Refill  . aspirin EC 81 MG tablet Take 1 tablet (81 mg total) by mouth daily.      Marland Kitchen b complex vitamins tablet Take 1 tablet by mouth daily.        . bisoprolol (ZEBETA) 5 MG tablet TAKE 1 TABLET BY MOUTH EVERY DAY  30 tablet  0  . Cholecalciferol (VITAMIN D-3) 5000 UNITS TABS Take 5,000 Units by mouth daily.       Marland Kitchen CINNAMON PO Take 1 capsule by mouth daily.      . clopidogrel (PLAVIX) 75 MG tablet Take 1 tablet (75 mg total) by mouth daily with breakfast.  30 tablet  6  .  fish oil-omega-3 fatty acids 1000 MG capsule Take 1,300 g by mouth 2 (two) times daily.       . furosemide (LASIX) 40 MG tablet Take 1 tablet (40 mg total) by mouth daily.  30 tablet  11  . hydrALAZINE (APRESOLINE) 25 MG tablet Take 1 tablet (25 mg total) by mouth 2 (two) times daily.  60 tablet  3  . isosorbide mononitrate (IMDUR) 60 MG 24 hr tablet Take 1 tablet (60 mg total) by mouth daily.  30 tablet  3  . loratadine (CLARITIN) 10 MG tablet Take 10 mg by mouth daily.      . Multiple Vitamins-Minerals (MULTIVITAMIN WITH MINERALS) tablet Take 1 tablet by mouth daily as needed (supplementation).      . nitroGLYCERIN (NITROSTAT) 0.4 MG SL tablet Place 1 tablet (0.4 mg total) under the tongue every 5 (five) minutes x 3 doses as needed for  chest pain.  25 tablet  4   No current facility-administered medications for this visit.    Allergies  Allergen Reactions  . Metoprolol Swelling    Takes bisoprolol without issue  . Norvasc [Amlodipine Besylate] Swelling    Takes nisoldipine without issue  . Sulfonamide Derivatives Hives, Itching and Swelling  . Amiodarone Nausea And Vomiting  . Medrol [Methylprednisolone]     Fluid retention/CHF exacerbation - caution with steroids in future if ever needed  . Statins     Myalgias - "I won't take them"  . Coumadin [Warfarin Sodium] Rash  . Latex Itching    Past Medical History  Diagnosis Date  . CKD (chronic kidney disease), stage IV     a. baseline creat previously 1.6->2 - elevated during 03/2013 adm.  Marland Kitchen HTN (hypertension)   . Anxiety   . History of shingles July 2004    on the left side of her thorax and postherpetic neuralgia  . History of psoriasis   . History of recurrent UTIs   . CAD (coronary artery disease)     a. [facility] 06/25/14 with severe 3V dz requiring multi-vessel PCI or CABG- pt refusese both. Treat medically. b. NSTEMI 05/2012 s/p DES to OM (residual severe LCx subbranch dz for med rx unless refractory angina recurs) c. NSTEMI 03/2013 felt 2/2 demand in setting of resp failure, for med rx.  Marland Kitchen LBBB (left bundle branch block)   . Chronic combined systolic and diastolic CHF (congestive heart failure)     a. ECHO: 06/17/2014 EF 50-55%, mild LVH. No WMAs. G1DD. Mild MR. Mild MR. Mod TR. PA pk pressure 48.  Marland Kitchen PAF (paroxysmal atrial fibrillation)     a. Dx 07/2012 -> Amio started but later dc'd 2/2 nausea/NSR. b. 02/2013 Coumadin initiated, dc'd 03/2013 2/2 allergic reaction. c. Ultimately not on NOAC due to age, Coumadin allergy and low CrCl.  Marland Kitchen Respiratory failure     a. Requiring intubation 03/2013, secondary to cardiogenic pulmonary edema.  Marland Kitchen HLD (hyperlipidemia)     a. statin intolerant per prior notes    Past Surgical History  Procedure Laterality Date  .  Salpingoophorectomy      "cyst removed"  . Appendectomy    . Cataract extraction, bilateral Bilateral ~ 2010  . Coronary angioplasty with stent placement  ~ 07/2012    "1"  . Abdominal hysterectomy      "partial"  . Breast cyst excision Bilateral     "total of 4-5"    History  Smoking status  . Former Smoker -- 1.50 packs/day for 30 years  .  Types: Cigarettes  . Quit date: 09/01/1979  Smokeless tobacco  . Never Used    History  Alcohol Use  . Yes    Comment: 06/19/2014  "none in the last 3-4 years""    Family History  Problem Relation Age of Onset  . Diabetes    . Cancer    . Lung cancer      Review of Systems: The review of systems is per the HPI.  All other systems were reviewed and are negative.  Physical Exam: BP 138/60  Pulse 61  Ht  (1.549 m)  Wt 126 lb 12.8 oz (57.516 kg)  BMI 23.97 kg/m2  SpO2 95% Patient is very pleasant and in no acute distress. She looks frail. Color sallow. HEENT is unremarkable. Normocephalic/atraumatic. PERRL. Sclera are nonicteric. Neck is supple. No masses. No JVD. Lungs are clear. Cardiac exam shows a regular rate and rhythm. Outflow murmur noted. Abdomen is soft. Extremities are without edema. Gait and ROM are intact. No gross neurologic deficits noted.  Wt Readings from Last 3 Encounters:  08/15/14 126 lb 12.8 oz (57.516 kg)  07/03/14 121 lb (54.885 kg)  07/01/14 121 lb 12.8 oz (55.248 kg)    LABORATORY DATA/PROCEDURES:  Lab Results  Component Value Date   WBC 8.7 06/24/2014   HGB 8.5* 06/24/2014   HCT 25.9* 06/24/2014   PLT 333 06/24/2014   GLUCOSE 88 07/18/2014   CHOL 223* 03/16/2013   TRIG 119 03/16/2013   HDL 59 03/16/2013   LDLCALC 140* 03/16/2013   ALT 10 06/16/2014   AST 19 06/16/2014   NA 135 07/18/2014   K 3.6 07/18/2014   CL 97 07/18/2014   CREATININE 3.0* 07/18/2014   BUN 46* 07/18/2014   CO2 30 07/18/2014   TSH 1.429 03/15/2013   INR 0.99 06/25/2014    BNP (last 3 results)  Recent Labs  06/16/14 0751    PROBNP 4799.0*   Cardiac Catheterization Procedure Note  Name: JAKIRA MCFADDEN  MRN: [id number]  DOB: [birth year]  Procedure: Left Heart Cath, Selective Coronary Angiography  Indication: Progressive angina, CCS Class 3 despite maximal medical therapy.  Procedural Details: The right wrist was prepped, draped, and anesthetized with 1% lidocaine. Using the modified Seldinger technique, a 5 French sheath was introduced into the right radial artery. 3 mg of verapamil was administered through the sheath, weight-based unfractionated heparin was administered intravenously. Standard Judkins catheters were used for selective coronary angiography. Left ventriculography was deferred because of chronic kidney disease. Catheter exchanges were performed over an exchange length guidewire. There were no immediate procedural complications. A TR band was used for radial hemostasis at the completion of the procedure. The patient was transferred to the post catheterization recovery area for further monitoring.  Procedural Findings:  Hemodynamics:  AO 132/49  LV 132/8  Coronary angiography:  Coronary dominance: right  Left mainstem: The left main short. The vessel is patent without obstructive disease.  Left anterior descending (LAD): The LAD is heavily calcified. There is an eccentric 80% stenosis in the proximal vessel before the first septal perforator. The mid and distal vessel are patent with mild to moderate diffuse disease noted. The diagonal branch is relatively small without significant obstruction  Left circumflex (LCx): The circumflex is patent. The stented segment in the first obtuse marginal is widely patent without in-stent restenosis. The OM branch is tortuous and it divides into twin vessels. The branch vessels of the OM are patent with 50% stenosis in the more distal branch. The  AV circumflex just beyond the obtuse marginal branch has 80% stenosis. The third OM branch has diffuse 75% stenosis.  Right  coronary artery (RCA): The RCA has severe diffuse disease with multiple 90% stenoses in the proximal and mid vessel leading into a total occlusion in the distal vessel. The distal branch vessels of the RCA filled from left to right collaterals.  Left ventriculography: Deferred because of advanced chronic kidney disease  Final Conclusions:  3 vessel coronary artery disease with severe stenosis of the proximal LAD, severe stenosis of the mid left circumflex, continued patency of the stented segment in a large OM branch, and total occlusion of the right coronary artery with left-to-right collaterals.  Recommendations: The patient has marked progression of her coronary artery disease from her last cardiac cath study 2 years ago. I don't think she has any to percutaneous revascularization options unless we were to do extensive PCI which clearly would result in a high likelihood of end-stage renal disease. Will review treatment options. She has had respiratory failure now on multiple occasions requiring mechanical ventilation. She is now a DO NOT RESUSCITATE. I do not think she would want to consider coronary bypass surgery. Will review at length with the patient and her family.  Tonny Bollman  06/25/2014, 1:44 PM    Echo Study Conclusions from July 2015  - Left ventricle: The cavity size was normal. Wall thickness was increased in a pattern of mild LVH. Systolic function was normal. The estimated ejection fraction was in the range of 50% to 55%. Wall motion was normal; there were no regional wall motion abnormalities. Doppler parameters are consistent with abnormal left ventricular relaxation (grade 1 diastolic dysfunction). Doppler parameters are consistent with high ventricular filling pressure. - Aortic valve: There was mild regurgitation. - Mitral valve: Calcified annulus. Mildly thickened leaflets . There was mild regurgitation. - Tricuspid valve: There was moderate regurgitation. - Pulmonary  arteries: PA peak pressure: 48 mm Hg (S).  Assessment / Plan:  1. Progressive CAD - with recent cath/NSTEMI - to be managed conservatively. Turned down for CABG. She seems to be holding her own. I wanted to see her back in about 3 months but she does not prefer to come back until April of 2016 (does not go out when weather turns cold). Will be available as needed. Overall prognosis poor.   2. Recent respiratory failure requiring intubation   3. CKD - followed by [name]   4. HTN - I have cut her hydralazine back. She will monitor at home and call if she continues to have low readings at home.  5. PAF - severe allergic reaction in the past to coumadin - in sinus by exam today.   Patient is agreeable to this plan and will call if any problems develop in the interim.   Rosalio Macadamia, RN, ANP-C [facility] [address] [place], [place]  [address] [phone]

## 2014-08-28 ENCOUNTER — Other Ambulatory Visit: Payer: Self-pay | Admitting: Family Medicine

## 2014-08-28 ENCOUNTER — Other Ambulatory Visit: Payer: Self-pay | Admitting: Cardiovascular Disease

## 2014-09-07 ENCOUNTER — Other Ambulatory Visit (HOSPITAL_COMMUNITY): Payer: Self-pay | Admitting: Cardiovascular Disease

## 2014-09-10 ENCOUNTER — Other Ambulatory Visit: Payer: Self-pay | Admitting: *Deleted

## 2014-09-10 ENCOUNTER — Encounter: Payer: Self-pay | Admitting: Cardiovascular Disease

## 2014-09-10 MED ORDER — HYDRALAZINE HCL 25 MG PO TABS: 12.5000 mg | ORAL_TABLET | Freq: Two times a day (BID) | ORAL | Status: DC

## 2014-09-10 NOTE — Telephone Encounter (Signed)
This encounter was created in error - please disregard.

## 2014-09-10 NOTE — Telephone Encounter (Signed)
°  Scott at pharmacy has questions regarding medication that you sent over, Please call and advise.

## 2014-09-18 ENCOUNTER — Other Ambulatory Visit: Payer: Self-pay

## 2014-09-18 ENCOUNTER — Encounter (HOSPITAL_COMMUNITY): Payer: Self-pay | Admitting: Emergency Medicine

## 2014-09-18 ENCOUNTER — Emergency Department (HOSPITAL_COMMUNITY): Payer: Medicare Other

## 2014-09-18 ENCOUNTER — Telehealth: Payer: Self-pay | Admitting: Nurse Practitioner

## 2014-09-18 ENCOUNTER — Inpatient Hospital Stay (HOSPITAL_COMMUNITY)
Admission: EM | Admit: 2014-09-18 | Discharge: 2014-09-20 | DRG: 291 | Disposition: A | Discharge status: Home / Self Care | Payer: Medicare Other | Attending: Internal Medicine | Admitting: Internal Medicine

## 2014-09-18 DIAGNOSIS — R6521 Severe sepsis with septic shock: Secondary | ICD-10-CM

## 2014-09-18 DIAGNOSIS — D508 Other iron deficiency anemias: Secondary | ICD-10-CM

## 2014-09-18 DIAGNOSIS — I214 Non-ST elevation (NSTEMI) myocardial infarction: Secondary | ICD-10-CM

## 2014-09-18 DIAGNOSIS — I480 Paroxysmal atrial fibrillation: Secondary | ICD-10-CM | POA: Diagnosis present

## 2014-09-18 DIAGNOSIS — Z7982 Long term (current) use of aspirin: Secondary | ICD-10-CM

## 2014-09-18 DIAGNOSIS — Z79899 Other long term (current) drug therapy: Secondary | ICD-10-CM

## 2014-09-18 DIAGNOSIS — R0602 Shortness of breath: Secondary | ICD-10-CM | POA: Diagnosis present

## 2014-09-18 DIAGNOSIS — I10 Essential (primary) hypertension: Secondary | ICD-10-CM

## 2014-09-18 DIAGNOSIS — A419 Sepsis, unspecified organism: Secondary | ICD-10-CM

## 2014-09-18 DIAGNOSIS — I447 Left bundle-branch block, unspecified: Secondary | ICD-10-CM | POA: Diagnosis present

## 2014-09-18 DIAGNOSIS — E785 Hyperlipidemia, unspecified: Secondary | ICD-10-CM | POA: Diagnosis present

## 2014-09-18 DIAGNOSIS — Z87891 Personal history of nicotine dependence: Secondary | ICD-10-CM

## 2014-09-18 DIAGNOSIS — Z955 Presence of coronary angioplasty implant and graft: Secondary | ICD-10-CM

## 2014-09-18 DIAGNOSIS — Z9842 Cataract extraction status, left eye: Secondary | ICD-10-CM | POA: Diagnosis not present

## 2014-09-18 DIAGNOSIS — D649 Anemia, unspecified: Secondary | ICD-10-CM | POA: Diagnosis present

## 2014-09-18 DIAGNOSIS — I129 Hypertensive chronic kidney disease with stage 1 through stage 4 chronic kidney disease, or unspecified chronic kidney disease: Secondary | ICD-10-CM | POA: Diagnosis present

## 2014-09-18 DIAGNOSIS — Z9841 Cataract extraction status, right eye: Secondary | ICD-10-CM

## 2014-09-18 DIAGNOSIS — I252 Old myocardial infarction: Secondary | ICD-10-CM

## 2014-09-18 DIAGNOSIS — M549 Dorsalgia, unspecified: Secondary | ICD-10-CM

## 2014-09-18 DIAGNOSIS — F419 Anxiety disorder, unspecified: Secondary | ICD-10-CM | POA: Diagnosis present

## 2014-09-18 DIAGNOSIS — Z7902 Long term (current) use of antithrombotics/antiplatelets: Secondary | ICD-10-CM | POA: Diagnosis not present

## 2014-09-18 DIAGNOSIS — Z66 Do not resuscitate: Secondary | ICD-10-CM | POA: Diagnosis present

## 2014-09-18 DIAGNOSIS — N184 Chronic kidney disease, stage 4 (severe): Secondary | ICD-10-CM | POA: Diagnosis present

## 2014-09-18 DIAGNOSIS — J9601 Acute respiratory failure with hypoxia: Secondary | ICD-10-CM | POA: Diagnosis present

## 2014-09-18 DIAGNOSIS — R011 Cardiac murmur, unspecified: Secondary | ICD-10-CM

## 2014-09-18 DIAGNOSIS — Z515 Encounter for palliative care: Secondary | ICD-10-CM | POA: Diagnosis not present

## 2014-09-18 DIAGNOSIS — R002 Palpitations: Secondary | ICD-10-CM

## 2014-09-18 DIAGNOSIS — I2510 Atherosclerotic heart disease of native coronary artery without angina pectoris: Secondary | ICD-10-CM | POA: Diagnosis present

## 2014-09-18 DIAGNOSIS — I5033 Acute on chronic diastolic (congestive) heart failure: Principal | ICD-10-CM | POA: Diagnosis present

## 2014-09-18 DIAGNOSIS — I5042 Chronic combined systolic (congestive) and diastolic (congestive) heart failure: Secondary | ICD-10-CM

## 2014-09-18 DIAGNOSIS — G8929 Other chronic pain: Secondary | ICD-10-CM

## 2014-09-18 DIAGNOSIS — I200 Unstable angina: Secondary | ICD-10-CM

## 2014-09-18 LAB — COMPREHENSIVE METABOLIC PANEL
ALBUMIN: 3.6 g/dL (ref 3.5–5.2)
ALT: 9 U/L (ref 0–35)
AST: 18 U/L (ref 0–37)
Alkaline Phosphatase: 61 U/L (ref 39–117)
Anion gap: 15 (ref 5–15)
BUN: 61 mg/dL — ABNORMAL HIGH (ref 6–23)
CHLORIDE: 93 meq/L — AB (ref 96–112)
CO2: 25 mEq/L (ref 19–32)
CREATININE: 3.13 mg/dL — AB (ref 0.50–1.10)
Calcium: 9.8 mg/dL (ref 8.4–10.5)
GFR calc Af Amer: 15 mL/min — ABNORMAL LOW (ref >90)
GFR calc non Af Amer: 13 mL/min — ABNORMAL LOW (ref >90)
Glucose, Bld: 164 mg/dL — ABNORMAL HIGH (ref 70–99)
Potassium: 4.3 mEq/L (ref 3.7–5.3)
Sodium: 133 mEq/L — ABNORMAL LOW (ref 137–147)
Total Bilirubin: 0.3 mg/dL (ref 0.3–1.2)
Total Protein: 7.4 g/dL (ref 6.0–8.3)

## 2014-09-18 LAB — FOLATE

## 2014-09-18 LAB — CBC WITH DIFFERENTIAL/PLATELET
BASOS ABS: 0 10*3/uL (ref 0.0–0.1)
Basophils Relative: 0 % (ref 0–1)
Eosinophils Absolute: 0 10*3/uL (ref 0.0–0.7)
Eosinophils Relative: 0 % (ref 0–5)
HEMATOCRIT: 17.7 % — AB (ref 36.0–46.0)
Hemoglobin: 5.6 g/dL — CL (ref 12.0–15.0)
Lymphocytes Relative: 8 % — ABNORMAL LOW (ref 12–46)
Lymphs Abs: 0.9 10*3/uL (ref 0.7–4.0)
MCH: 25.5 pg — ABNORMAL LOW (ref 26.0–34.0)
MCHC: 31.6 g/dL (ref 30.0–36.0)
MCV: 80.5 fL (ref 78.0–100.0)
MONO ABS: 0.5 10*3/uL (ref 0.1–1.0)
MONOS PCT: 4 % (ref 3–12)
NEUTROS ABS: 9.7 10*3/uL — AB (ref 1.7–7.7)
Neutrophils Relative %: 87 % — ABNORMAL HIGH (ref 43–77)
Platelets: 427 10*3/uL — ABNORMAL HIGH (ref 150–400)
RBC: 2.2 MIL/uL — ABNORMAL LOW (ref 3.87–5.11)
RDW: 14.1 % (ref 11.5–15.5)
WBC: 11.1 10*3/uL — ABNORMAL HIGH (ref 4.0–10.5)

## 2014-09-18 LAB — FERRITIN: Ferritin: 7 ng/mL — ABNORMAL LOW (ref 10–291)

## 2014-09-18 LAB — I-STAT ARTERIAL BLOOD GAS, ED
ACID-BASE EXCESS: 1 mmol/L (ref 0.0–2.0)
BICARBONATE: 27.3 meq/L — AB (ref 20.0–24.0)
O2 SAT: 98 %
TCO2: 29 mmol/L (ref 0–100)
pCO2 arterial: 53.2 mmHg — ABNORMAL HIGH (ref 35.0–45.0)
pH, Arterial: 7.317 — ABNORMAL LOW (ref 7.350–7.450)
pO2, Arterial: 116 mmHg — ABNORMAL HIGH (ref 80.0–100.0)

## 2014-09-18 LAB — VITAMIN B12: VITAMIN B 12: 1027 pg/mL — AB (ref 211–911)

## 2014-09-18 LAB — IRON AND TIBC
IRON: 16 ug/dL — AB (ref 42–135)
Saturation Ratios: 3 % — ABNORMAL LOW (ref 20–55)
TIBC: 511 ug/dL — ABNORMAL HIGH (ref 250–470)
UIBC: 495 ug/dL — AB (ref 125–400)

## 2014-09-18 LAB — RETICULOCYTES
RBC.: 2.12 MIL/uL — AB (ref 3.87–5.11)
Retic Count, Absolute: 61.5 10*3/uL (ref 19.0–186.0)
Retic Ct Pct: 2.9 % (ref 0.4–3.1)

## 2014-09-18 LAB — PREPARE RBC (CROSSMATCH)

## 2014-09-18 LAB — CBG MONITORING, ED: Glucose-Capillary: 172 mg/dL — ABNORMAL HIGH (ref 70–99)

## 2014-09-18 LAB — TROPONIN I

## 2014-09-18 LAB — ABO/RH: ABO/RH(D): A POS

## 2014-09-18 LAB — I-STAT CG4 LACTIC ACID, ED: Lactic Acid, Venous: 1.64 mmol/L (ref 0.5–2.2)

## 2014-09-18 LAB — PRO B NATRIURETIC PEPTIDE: PRO B NATRI PEPTIDE: 10273 pg/mL — AB (ref 0–450)

## 2014-09-18 MED ORDER — ONDANSETRON HCL 4 MG PO TABS: 4.0000 mg | ORAL_TABLET | Freq: Four times a day (QID) | ORAL | Status: DC | PRN

## 2014-09-18 MED ORDER — SODIUM CHLORIDE 0.9 % IJ SOLN
3.0000 mL | Freq: Two times a day (BID) | INTRAMUSCULAR | Status: DC
  Administered 2014-09-18 – 2014-09-20 (×4): 3 mL via INTRAVENOUS

## 2014-09-18 MED ORDER — SODIUM CHLORIDE 0.9 % IV SOLN
Freq: Once | INTRAVENOUS | Status: AC
Start: 2014-09-18 — End: 2014-09-18
  Administered 2014-09-18: 19:00:00 via INTRAVENOUS

## 2014-09-18 MED ORDER — FUROSEMIDE 10 MG/ML IJ SOLN: 40.0000 mg | Freq: Once | INTRAMUSCULAR | Status: DC

## 2014-09-18 MED ORDER — FUROSEMIDE 10 MG/ML IJ SOLN
40.0000 mg | Freq: Two times a day (BID) | INTRAMUSCULAR | Status: DC
  Administered 2014-09-18 – 2014-09-19 (×3): 40 mg via INTRAVENOUS
  Filled 2014-09-18 (×5): qty 4

## 2014-09-18 MED ORDER — ONDANSETRON HCL 4 MG/2ML IJ SOLN: 4.0000 mg | Freq: Four times a day (QID) | INTRAMUSCULAR | Status: DC | PRN

## 2014-09-18 MED ORDER — ACETAMINOPHEN 650 MG RE SUPP: 650.0000 mg | Freq: Four times a day (QID) | RECTAL | Status: DC | PRN

## 2014-09-18 MED ORDER — ACETAMINOPHEN 325 MG PO TABS: 650.0000 mg | ORAL_TABLET | Freq: Four times a day (QID) | ORAL | Status: DC | PRN

## 2014-09-18 NOTE — ED Notes (Signed)
EMS - Patient coming from home with co of shortness of breath.  Pt stated she has been feeling this way x 3 days, having to sleep in a recliner to get relief.  Pt reported having bilateral arm pain last night, took 2 nitro with relief.  Pt with Fire on scene was 88%, placed on 6L Nasal Cannule brought up to 100%.  Pt unable to have full sentences due to the shortness of breath.  Pt had a MI in July.  Denies chest pain.  147/73, 83 heart rate, 100% nasal cannula at 6L, no pain.

## 2014-09-18 NOTE — ED Notes (Signed)
Notified respiratory that patient needs bipap.

## 2014-09-18 NOTE — ED Notes (Signed)
Family at bedside. 

## 2014-09-18 NOTE — H&P (Signed)
[facility] History and Physical  GULIANA WEYANDT ZOX:[id number] DOB: Dec 05, 1929 DOA: 09/18/2014  Referring physician: er PCP: [name], MD   Chief Complaint: SOB  HPI: [name] is a 80 y.o. female  Who has a long PMHX.  Most recently in July, she was hospitalized for acute respiratory failure requiring intubation.  She was found on cath to have severe multi-vessel CAD and stage IV CKD. Her creatinine went from 2.6 pre-cath on 7/30 to 3.4 on 8/4 after cath.  She is being treated with medical management.  Daughter reports that over the last few days she has had worsening sOB.  She can't lay down.  Last PM she had B/L arm pain and took a nitro.  This AM, she called EMS to bring her to the hospital.  Sats were 88% when EMS arrives, placed on 6L and increased to 100% DAughter states that her stools have been normal.  No new medications  In the ER, her Hgb was found to be 5.6- hemoccult negative.  Chest x ray showed mild CHF.  She was placed on Bipap with improvement in breathing.  She does not want to be a full code- does not want intubation or CPR.      Review of Systems:  Unable to do ROS as patient on Bipap   Past Medical History  Diagnosis Date  . CKD (chronic kidney disease), stage IV     a. baseline creat previously 1.6->2 - elevated during 03/2013 adm.  Marland Kitchen HTN (hypertension)   . Anxiety   . History of shingles July 2004    on the left side of her thorax and postherpetic neuralgia  . History of psoriasis   . History of recurrent UTIs   . CAD (coronary artery disease)     a. [facility] 06/25/14 with severe 3V dz requiring multi-vessel PCI or CABG- pt refusese both. Treat medically. b. NSTEMI 05/2012 s/p DES to OM (residual severe LCx subbranch dz for med rx unless refractory angina recurs) c. NSTEMI 03/2013 felt 2/2 demand in setting of resp failure, for med rx.  Marland Kitchen LBBB (left bundle branch block)   . Chronic combined systolic and diastolic CHF (congestive heart  failure)     a. ECHO: 06/17/2014 EF 50-55%, mild LVH. No WMAs. G1DD. Mild MR. Mild MR. Mod TR. PA pk pressure 48.  Marland Kitchen PAF (paroxysmal atrial fibrillation)     a. Dx 07/2012 -> Amio started but later dc'd 2/2 nausea/NSR. b. 02/2013 Coumadin initiated, dc'd 03/2013 2/2 allergic reaction. c. Ultimately not on NOAC due to age, Coumadin allergy and low CrCl.  Marland Kitchen Respiratory failure     a. Requiring intubation 03/2013, secondary to cardiogenic pulmonary edema.  Marland Kitchen HLD (hyperlipidemia)     a. statin intolerant per prior notes   Past Surgical History  Procedure Laterality Date  . Salpingoophorectomy      "cyst removed"  . Appendectomy    . Cataract extraction, bilateral Bilateral ~ 2010  . Coronary angioplasty with stent placement  ~ 07/2012    "1"  . Abdominal hysterectomy      "partial"  . Breast cyst excision Bilateral     "total of 4-5"   Social History:  reports that she quit smoking about 35 years ago. Her smoking use included Cigarettes. She has a 45 pack-year smoking history. She has never used smokeless tobacco. She reports that she drinks alcohol. She reports that she does not use illicit drugs.  Allergies  Allergen Reactions  . Metoprolol  Swelling    Takes bisoprolol without issue  . Norvasc [Amlodipine Besylate] Swelling    Takes nisoldipine without issue  . Sulfonamide Derivatives Hives, Itching and Swelling  . Amiodarone Nausea And Vomiting  . Medrol [Methylprednisolone]     Fluid retention/CHF exacerbation - caution with steroids in future if ever needed  . Statins     Myalgias - "I won't take them"  . Coumadin [Warfarin Sodium] Rash  . Latex Itching    Family History  Problem Relation Age of Onset  . Diabetes    . Cancer    . Lung cancer       Prior to Admission medications   Medication Sig Start Date End Date Taking? Authorizing Provider  aspirin EC 81 MG tablet Take 1 tablet (81 mg total) by mouth daily. 06/08/12   Dayna N Dunn, PA-C  b complex vitamins tablet Take  1 tablet by mouth daily.      Historical Provider, MD  bisoprolol (ZEBETA) 5 MG tablet TAKE 1 TABLET BY MOUTH EVERY DAY 09/09/14   Tonny BollmanMichael Cooper, MD  Cholecalciferol (VITAMIN D-3) 5000 UNITS TABS Take 5,000 Units by mouth daily.     Historical Provider, MD  CINNAMON PO Take 1 capsule by mouth daily.    Historical Provider, MD  clopidogrel (PLAVIX) 75 MG tablet Take 1 tablet (75 mg total) by mouth daily with breakfast. 04/11/13   Dayna N Dunn, PA-C  clopidogrel (PLAVIX) 75 MG tablet TAKE 1 TABLET BY MOUTH EVERY DAY WITH BREAKFAST 08/28/14   Kristian CoveyBruce W Burchette, MD  fish oil-omega-3 fatty acids 1000 MG capsule Take 1,300 g by mouth 2 (two) times daily.     Historical Provider, MD  furosemide (LASIX) 40 MG tablet Take 1 tablet (40 mg total) by mouth daily. 06/26/14   Janetta HoraKathryn R Thompson, PA-C  hydrALAZINE (APRESOLINE) 25 MG tablet Take 0.5 tablets (12.5 mg total) by mouth 2 (two) times daily. 09/10/14   Tonny BollmanMichael Cooper, MD  isosorbide mononitrate (IMDUR) 60 MG 24 hr tablet TAKE 1 TABLET BY MOUTH EVERY DAY 08/29/14   Tonny BollmanMichael Cooper, MD  loratadine (CLARITIN) 10 MG tablet Take 10 mg by mouth daily.    Historical Provider, MD  Multiple Vitamins-Minerals (MULTIVITAMIN WITH MINERALS) tablet Take 1 tablet by mouth daily as needed (supplementation).    Historical Provider, MD  nitroGLYCERIN (NITROSTAT) 0.4 MG SL tablet Place 1 tablet (0.4 mg total) under the tongue every 5 (five) minutes x 3 doses as needed for chest pain. 02/07/14 10/09/15  Kristian CoveyBruce W Burchette, MD   Physical Exam: Filed Vitals:   09/18/14 1500 09/18/14 1503  BP: 104/64 104/64  Pulse: 94 95  Temp:  97.6 F (36.4 C)  TempSrc:  Oral  Resp: 33 22  SpO2: 100% 99%    Wt Readings from Last 3 Encounters:  08/15/14 57.516 kg (126 lb 12.8 oz)  07/03/14 54.885 kg (121 lb)  07/01/14 55.248 kg (121 lb 12.8 oz)    General:  Pale. Sick appearing on Bipap Eyes: PERRL, normal lids, irises & conjunctiva ENT: grossly normal hearing, lips & tongue Neck:  no LAD, masses or thyromegaly Cardiovascular: RRR, no m/r/g. No edema. Respiratory: no wheezing, crackles at bases Abdomen: soft, ntnd Skin: no rash or induration seen on limited exam Musculoskeletal: grossly normal tone BUE/BLE Psychiatric: unable to assess, on bipap Neurologic: unable to assess          Labs on Admission:  Basic Metabolic Panel:  Recent Labs Lab 09/18/14 1503  NA 133*  K  4.3  CL 93*  CO2 25  GLUCOSE 164*  BUN 61*  CREATININE 3.13*  CALCIUM 9.8   Liver Function Tests:  Recent Labs Lab 09/18/14 1503  AST 18  ALT 9  ALKPHOS 61  BILITOT 0.3  PROT 7.4  ALBUMIN 3.6   No results found for this basename: LIPASE, AMYLASE,  in the last 168 hours No results found for this basename: AMMONIA,  in the last 168 hours CBC:  Recent Labs Lab 09/18/14 1503  WBC 11.1*  NEUTROABS 9.7*  HGB 5.6*  HCT 17.7*  MCV 80.5  PLT 427*   Cardiac Enzymes:  Recent Labs Lab 09/18/14 1503  TROPONINI <0.30    BNP (last 3 results)  Recent Labs  06/16/14 0751 09/18/14 1503  PROBNP 4799.0* 10273.0*   CBG:  Recent Labs Lab 09/18/14 1509  GLUCAP 172*    Radiological Exams on Admission: Dg Chest Portable 1 View  09/18/2014   CLINICAL DATA:  Shortness of breath. Combined systolic and diastolic congestive heart failure. Respiratory failure. Chronic kidney disease. Atrial fibrillation. Initial encounter.  EXAM: PORTABLE CHEST - 1 VIEW  COMPARISON:  06/19/2014  FINDINGS: Stable moderately enlarged cardiopericardial silhouette. Indistinct pulmonary vasculature with mild interstitial accentuation bilaterally, increased from prior. Faint Kerley B-lines. No airspace edema.  Tortuous thoracic aorta.  No pleural effusion.  IMPRESSION: 1. Cardiomegaly with mild interstitial pulmonary edema.   Electronically Signed   By: Herbie BaltimoreWalt  Liebkemann M.D.   On: 09/18/2014 15:24    EKG: Independently reviewed. Appears to be sinus with lost of artifact  Assessment/Plan Active  Problems:   CAD (coronary artery disease)   Acute respiratory failure with hypoxia   Anemia   SOB/acute respiratory failure- suspect a combination of anemia and mild CHF- chest x ray shows mild CHF only, patient is requiring Bipap to keep O2 sats up---- will give 2 units PRBC- ? Etiology of anemia- perhaps CKD as kidney function has worsened since her cath in July -lasix IV -monitor BMP/CBC - iron studies before transfusions (heme negative) -BIPAP in SDU -cards consult  CKD- follows with Dr. Hyman HopesWebb- consult if worsening renal functions  Poor overall prognosis as patient has severe multi-vessel CAD and stage IV CKD and is not a candidate for CABG  cardiology  Code Status: DNR DVT Prophylaxis: Family Communication: daughter at bedside Disposition Plan: SDU  Time spent: 75 min  Marlin CanaryVANN, [place] Hedman [facility] Pager [phone]

## 2014-09-18 NOTE — Consult Note (Signed)
CARDIOLOGY CONSULT NOTE       Patient ID: [name] MRN: [id number] DOB/AGE: 78/07/1930 78 y.o.  Admit date: 09/18/2014 Referring Physician:  Patria Maneampos Primary Physician: Kristian CoveyBURCHETTE,BRUCE W, MD Primary Cardiologist:   Reason for Consultation: CHF   Active Problems:   CKD (chronic kidney disease), stage IV   CAD (coronary artery disease)   Acute respiratory failure with hypoxia   Anemia   SOB (shortness of breath)   HPI:   Asked to see for CHF in ER  She is an 80 year old female with diastolic HF, CAD and AF. She had a NSTEMI in 2013 and was treated with a DES to the OM of the LCX. She has had symptomatic AF with associated heart failure as well as ventilatory dependent respiratory failure. She is unable to take anticoagulation - has had severe allergic skin reaction to warfarin. Maintained on DAPT with aspirin and Plavix. She has a chronic LBBB on EKG. Known valvular heart disease as well and stage IV CKD baseline Cr around 3  - followed by Dr. Hyman HopesWebb. She was admitted with respiratory failure from 7/20 to 7/24 - was intubated. Troponins elevated. Had a UTI. Echo showed EF of 50%. Ended up getting cathed with limited contrast. . Cath showed marked progression of her CAD. Options include CABG and multivessel PCI - both carrying risk of progression of her renal disease. She has favored palliative medical therapy. She made herself a DNR. Admitted to ER today with respiratory failure and marked anemia with Hct 17   History from daughter as patient lethargic and on bipap. No chest pain Daughter comfirms palliative care DNR with no CPR or intubation     ROS All other systems reviewed and negative except as noted above  Past Medical History  Diagnosis Date  . CKD (chronic kidney disease), stage IV     a. baseline creat previously 1.6->2 - elevated during 03/2013 adm.  Marland Kitchen. HTN (hypertension)   . Anxiety   . History of shingles July 2004    on the left side of her thorax and postherpetic  neuralgia  . History of psoriasis   . History of recurrent UTIs   . CAD (coronary artery disease)     a. [facility] 06/[facility]/14 with severe 3V dz requiring multi-vessel PCI or CABG- pt refusese both. Treat medically. b. NSTEMI 05/2012 s/p DES to OM (residual severe LCx subbranch dz for med rx unless refractory angina recurs) c. NSTEMI 03/2013 felt 2/2 demand in setting of resp failure, for med rx.  Marland Kitchen. LBBB (left bundle branch block)   . Chronic combined systolic and diastolic CHF (congestive heart failure)     a. ECHO: 06/17/2014 EF 50-55%, mild LVH. No WMAs. G1DD. Mild MR. Mild MR. Mod TR. PA pk pressure 48.  Marland Kitchen. PAF (paroxysmal atrial fibrillation)     a. Dx 07/2012 -> Amio started but later dc'd 2/2 nausea/NSR. b. 02/2013 Coumadin initiated, dc'd 03/2013 2/2 allergic reaction. c. Ultimately not on NOAC due to age, Coumadin allergy and low CrCl.  Marland Kitchen. Respiratory failure     a. Requiring intubation 03/2013, secondary to cardiogenic pulmonary edema.  Marland Kitchen. HLD (hyperlipidemia)     a. statin intolerant per prior notes    Family History  Problem Relation Age of Onset  . Diabetes    . Cancer    . Lung cancer      History   Social History  . Marital Status: Widowed    Spouse Name: N/A    Number  of Children: N/A  . Years of Education: N/A   Occupational History  . Not on file.   Social History Main Topics  . Smoking status: Former Smoker -- 1.50 packs/day for 30 years    Types: Cigarettes    Quit date: 09/01/1979  . Smokeless tobacco: Never Used  . Alcohol Use: Yes     Comment: 06/19/2014  "none in the last 3-4 years""  . Drug Use: No  . Sexual Activity: No   Other Topics Concern  . Not on file   Social History Narrative   Lives in [place].    Past Surgical History  Procedure Laterality Date  . Salpingoophorectomy      "cyst removed"  . Appendectomy    . Cataract extraction, bilateral Bilateral ~ 2010  . Coronary angioplasty with stent placement  ~ 07/2012    "1"  . Abdominal hysterectomy       "partial"  . Breast cyst excision Bilateral     "total of 4-5"       . sodium chloride      Physical Exam: Blood pressure 120/65, pulse 88, temperature 97.1 F (36.2 C), temperature source Axillary, resp. rate 25, SpO2 100.00%.   Somnolent  Pale chronically ill female  HEENT: normal Neck supple with no adenopathy JVP normal no bruits no thyromegaly Lungs rhonchi expitory wheezing and good diaphragmatic motion Heart:  S1/S2 SEM murmur, no rub, gallop or click PMI normal Abdomen: benighn, BS positve, no tenderness, no AAA no bruit.  No HSM or HJR Distal pulses intact with no bruits No edema Neuro non-focal Skin cool   Labs:   Lab Results  Component Value Date   WBC 11.1* 09/18/2014   HGB 5.6* 09/18/2014   HCT 17.7* 09/18/2014   MCV 80.5 09/18/2014   PLT 427* 09/18/2014    Recent Labs Lab 09/18/14 1503  NA 133*  K 4.3  CL 93*  CO2 25  BUN 61*  CREATININE 3.13*  CALCIUM 9.8  PROT 7.4  BILITOT 0.3  ALKPHOS 61  ALT 9  AST 18  GLUCOSE 164*   Lab Results  Component Value Date   CKTOTAL 72 08/15/2012   CKMB 5.1* 08/15/2012   TROPONINI <0.30 09/18/2014    Lab Results  Component Value Date   CHOL 223* 03/16/2013   CHOL 202* 06/07/2012   Lab Results  Component Value Date   HDL 59 03/16/2013   HDL 67 1/61/09607/09/2012   Lab Results  Component Value Date   LDLCALC 140* 03/16/2013   LDLCALC 115* 06/07/2012   Lab Results  Component Value Date   TRIG 119 03/16/2013   TRIG 102 06/07/2012   Lab Results  Component Value Date   CHOLHDL 3.8 03/16/2013   CHOLHDL 3.0 06/07/2012   No results found for this basename: LDLDIRECT      Radiology: Dg Chest Portable 1 View  09/18/2014   CLINICAL DATA:  Shortness of breath. Combined systolic and diastolic congestive heart failure. Respiratory failure. Chronic kidney disease. Atrial fibrillation. Initial encounter.  EXAM: PORTABLE CHEST - 1 VIEW  COMPARISON:  06/19/2014  FINDINGS: Stable moderately enlarged  cardiopericardial silhouette. Indistinct pulmonary vasculature with mild interstitial accentuation bilaterally, increased from prior. Faint Kerley B-lines. No airspace edema.  Tortuous thoracic aorta.  No pleural effusion.  IMPRESSION: 1. Cardiomegaly with mild interstitial pulmonary edema.   Electronically Signed   By: Herbie BaltimoreWalt  Liebkemann M.D.   On: 09/18/2014 15:24    EKG:  SR IVCD LBBB no acute changes  ASSESSMENT AND PLAN:  CHF:  I am not sure cardiology has anything to offer this patient.  Volume overload from renal failure and diastolic dysfunctoin. Continue CPAP and diuretic especially with need for transfusion  Anemia:  Etiology not clear guaic stools  Transfuse 2 units with diuretic Respitory failure:  Check ABG  CPAP  DNR not to be intubated CRF:  Does not want dialysis  May be contributing to anemia  Patient should have palliative care consult and consider hospice.  Will sign off  Signed: Charlton Haws 09/18/2014, 5:44 PM

## 2014-09-18 NOTE — Telephone Encounter (Signed)
Daughter calls to report mom has been having sob, cold sweats, arm pain nausea with dry heaves. Took a Nitro last pm. Having dyspnea, cannot lay flat in bed. She only wants me to speak with Annice Needylori Gearhardt or Dr. Excell Seltzerooper.per Lawson FiscalLori, daughter needs to call [facility] for transportation to the ED. She is agreeable to this plan.

## 2014-09-18 NOTE — ED Notes (Signed)
Pt. taken off bipap to cough, replaced @ current settings, RT to monitor.

## 2014-09-18 NOTE — ED Provider Notes (Signed)
CSN: [id number]     Arrival date & time 09/18/14  1459 History   First MD Initiated Contact with Patient 09/18/14 1506     Chief Complaint  Patient presents with  . Shortness of Breath    Level V caveat: Respiratory distress  HPI Patient is brought to the emergency department for increasing shortness of breath over the past several days per EMS and the family.  Patient has a history of congestive heart failure and severe coronary artery disease.  As reported that she's had increased orthopnea over the past several days.  At this time she can only speak in occasional short phrase.  Patient was hypoxic for the rescue squad on their arrival was placed on 6 L nasal cannula.  No reports of blood in her stool.    Past Medical History  Diagnosis Date  . CKD (chronic kidney disease), stage IV     a. baseline creat previously 1.6->2 - elevated during 03/2013 adm.  Marland Kitchen HTN (hypertension)   . Anxiety   . History of shingles July 2004    on the left side of her thorax and postherpetic neuralgia  . History of psoriasis   . History of recurrent UTIs   . CAD (coronary artery disease)     a. [facility] 06/25/14 with severe 3V dz requiring multi-vessel PCI or CABG- pt refusese both. Treat medically. b. NSTEMI 05/2012 s/p DES to OM (residual severe LCx subbranch dz for med rx unless refractory angina recurs) c. NSTEMI 03/2013 felt 2/2 demand in setting of resp failure, for med rx.  Marland Kitchen LBBB (left bundle branch block)   . Chronic combined systolic and diastolic CHF (congestive heart failure)     a. ECHO: 06/17/2014 EF 50-55%, mild LVH. No WMAs. G1DD. Mild MR. Mild MR. Mod TR. PA pk pressure 48.  Marland Kitchen PAF (paroxysmal atrial fibrillation)     a. Dx 07/2012 -> Amio started but later dc'd 2/2 nausea/NSR. b. 02/2013 Coumadin initiated, dc'd 03/2013 2/2 allergic reaction. c. Ultimately not on NOAC due to age, Coumadin allergy and low CrCl.  Marland Kitchen Respiratory failure     a. Requiring intubation 03/2013, secondary to cardiogenic  pulmonary edema.  Marland Kitchen HLD (hyperlipidemia)     a. statin intolerant per prior notes   Past Surgical History  Procedure Laterality Date  . Salpingoophorectomy      "cyst removed"  . Appendectomy    . Cataract extraction, bilateral Bilateral ~ 2010  . Coronary angioplasty with stent placement  ~ 07/2012    "1"  . Abdominal hysterectomy      "partial"  . Breast cyst excision Bilateral     "total of 4-5"   Family History  Problem Relation Age of Onset  . Diabetes    . Cancer    . Lung cancer     History  Substance Use Topics  . Smoking status: Former Smoker -- 1.50 packs/day for 30 years    Types: Cigarettes    Quit date: 09/01/1979  . Smokeless tobacco: Never Used  . Alcohol Use: Yes     Comment: 06/19/2014  "none in the last 3-4 years""   OB History   Grav Para Term Preterm Abortions TAB SAB Ect Mult Living                 Review of Systems  Unable to perform ROS: Severe respiratory distress      Allergies  Metoprolol; Norvasc; Sulfonamide derivatives; Amiodarone; Medrol; Statins; Coumadin; and Latex  Home Medications  Prior to Admission medications   Medication Sig Start Date End Date Taking? Authorizing Provider  aspirin EC 81 MG tablet Take 1 tablet (81 mg total) by mouth daily. 06/08/12   Dayna N Dunn, PA-C  b complex vitamins tablet Take 1 tablet by mouth daily.      Historical Provider, MD  bisoprolol (ZEBETA) 5 MG tablet TAKE 1 TABLET BY MOUTH EVERY DAY 09/09/14   Tonny BollmanMichael Cooper, MD  Cholecalciferol (VITAMIN D-3) 5000 UNITS TABS Take 5,000 Units by mouth daily.     Historical Provider, MD  CINNAMON PO Take 1 capsule by mouth daily.    Historical Provider, MD  clopidogrel (PLAVIX) 75 MG tablet Take 1 tablet (75 mg total) by mouth daily with breakfast. 04/11/13   Dayna N Dunn, PA-C  clopidogrel (PLAVIX) 75 MG tablet TAKE 1 TABLET BY MOUTH EVERY DAY WITH BREAKFAST 08/28/14   Kristian CoveyBruce W Burchette, MD  fish oil-omega-3 fatty acids 1000 MG capsule Take 1,300 g by mouth  2 (two) times daily.     Historical Provider, MD  furosemide (LASIX) 40 MG tablet Take 1 tablet (40 mg total) by mouth daily. 06/26/14   Janetta HoraKathryn R Thompson, PA-C  hydrALAZINE (APRESOLINE) 25 MG tablet Take 0.5 tablets (12.5 mg total) by mouth 2 (two) times daily. 09/10/14   Tonny BollmanMichael Cooper, MD  isosorbide mononitrate (IMDUR) 60 MG 24 hr tablet TAKE 1 TABLET BY MOUTH EVERY DAY 08/29/14   Tonny BollmanMichael Cooper, MD  loratadine (CLARITIN) 10 MG tablet Take 10 mg by mouth daily.    Historical Provider, MD  Multiple Vitamins-Minerals (MULTIVITAMIN WITH MINERALS) tablet Take 1 tablet by mouth daily as needed (supplementation).    Historical Provider, MD  nitroGLYCERIN (NITROSTAT) 0.4 MG SL tablet Place 1 tablet (0.4 mg total) under the tongue every 5 (five) minutes x 3 doses as needed for chest pain. 02/07/14 10/09/15  Kristian CoveyBruce W Burchette, MD   BP 104/64  Pulse 95  Temp(Src) 97.6 F (36.4 C) (Oral)  Resp 22  SpO2 99% Physical Exam  Nursing note and vitals reviewed. Constitutional: She appears well-developed and well-nourished. She appears distressed.  HENT:  Head: Normocephalic and atraumatic.  Eyes: EOM are normal.  Neck: Normal range of motion.  Cardiovascular: Normal rate and regular rhythm.   Pulmonary/Chest: She is in respiratory distress. She has no wheezes. She has rales.  Abdominal: Soft. She exhibits no distension. There is no tenderness.  Musculoskeletal: Normal range of motion. She exhibits no edema.  Neurological: She is alert.  Skin: Skin is warm and dry. There is pallor.  Psychiatric: She has a normal mood and affect. Judgment normal.    ED Course  Procedures (including critical care time)  CRITICAL CARE Performed by: Lyanne CoAMPOS,Deforest Maiden M Total critical care time: 35 Critical care time was exclusive of separately billable procedures and treating other patients. Critical care was necessary to treat or prevent imminent or life-threatening deterioration. Critical care was time spent personally  by me on the following activities: development of treatment plan with patient and/or surrogate as well as nursing, discussions with consultants, evaluation of patient's response to treatment, examination of patient, obtaining history from patient or surrogate, ordering and performing treatments and interventions, ordering and review of laboratory studies, ordering and review of radiographic studies, pulse oximetry and re-evaluation of patient's condition.   Labs Review Labs Reviewed  CBC WITH DIFFERENTIAL - Abnormal; Notable for the following:    WBC 11.1 (*)    RBC 2.20 (*)    Hemoglobin 5.6 (*)  HCT 17.7 (*)    MCH 25.5 (*)    Platelets 427 (*)    Neutrophils Relative % 87 (*)    Neutro Abs 9.7 (*)    Lymphocytes Relative 8 (*)    All other components within normal limits  COMPREHENSIVE METABOLIC PANEL - Abnormal; Notable for the following:    Sodium 133 (*)    Chloride 93 (*)    Glucose, Bld 164 (*)    BUN 61 (*)    Creatinine, Ser 3.13 (*)    GFR calc non Af Amer 13 (*)    GFR calc Af Amer 15 (*)    All other components within normal limits  PRO B NATRIURETIC PEPTIDE - Abnormal; Notable for the following:    Pro B Natriuretic peptide (BNP) 10273.0 (*)    All other components within normal limits  I-STAT ARTERIAL BLOOD GAS, ED - Abnormal; Notable for the following:    pH, Arterial 7.317 (*)    pCO2 arterial 53.2 (*)    pO2, Arterial 116.0 (*)    Bicarbonate 27.3 (*)    All other components within normal limits  CBG MONITORING, ED - Abnormal; Notable for the following:    Glucose-Capillary 172 (*)    All other components within normal limits  TROPONIN I  VITAMIN B12  FOLATE  IRON AND TIBC  FERRITIN  RETICULOCYTES  I-STAT CG4 LACTIC ACID, ED  TYPE AND SCREEN  PREPARE RBC (CROSSMATCH)  ABO/RH    Imaging Review Dg Chest Portable 1 View  09/18/2014   CLINICAL DATA:  Shortness of breath. Combined systolic and diastolic congestive heart failure. Respiratory failure.  Chronic kidney disease. Atrial fibrillation. Initial encounter.  EXAM: PORTABLE CHEST - 1 VIEW  COMPARISON:  06/19/2014  FINDINGS: Stable moderately enlarged cardiopericardial silhouette. Indistinct pulmonary vasculature with mild interstitial accentuation bilaterally, increased from prior. Faint Kerley B-lines. No airspace edema.  Tortuous thoracic aorta.  No pleural effusion.  IMPRESSION: 1. Cardiomegaly with mild interstitial pulmonary edema.   Electronically Signed   By: Herbie Baltimore M.D.   On: 09/18/2014 15:24     EKG Interpretation   Date/Time:  Thursday September 18 2014 14:55:18 EDT Ventricular Rate:  96 PR Interval:  119 QRS Duration: 167 QT Interval:  377 QTC Calculation: 476 R Axis:   -71 Text Interpretation:  Sinus rhythm Borderline short PR interval  Nonspecific IVCD with LAD LVH with secondary repolarization abnormality  Artifact in lead(s) II III aVR aVL aVF V1 V2 V3 V4 V5 and baseline wander  in lead(s) II III aVF V3 V6 No significant change was found Confirmed by  Reeve Mallo  MD, Caryn Bee ([id number]) on 09/18/2014 3:01:15 PM      MDM   Final diagnoses:  None    patient presents in severe respiratory distress.  Placed on BiPAP on arrival for suspected congestive heart failure.  Chest x-ray demonstrates vascular congestion but also labs demonstrate severe new anemia.  Anemia panel added.  Hemoccult negative with brown stool.  Patient feeling much better on BiPAP.  Patient will be transfused packed red blood cells.  Admit to step down unit.  I discussed the case with the triad hospitalist.  Case also discussed with her cardiology team who will assist in consultation for CHF exacerbation.  Patient is a DO NOT RESUSCITATE.  She does not want to be intubated.  She does not want CPR.  She does not want cardioversion or defibrillation.  She is agreeable to BiPAP and blood transfusion  [facility] Hospitalist Cardiology:  [facility]    Lyanne CoKevin M Anelis Hrivnak, MD 09/18/14 [phone]

## 2014-09-18 NOTE — ED Notes (Signed)
Notified Dr Patria Maneampos of critical HGB 5.6.

## 2014-09-18 NOTE — Telephone Encounter (Signed)
New Message      Pt's daughter calling stating pt is having sob, cold sweats, gagging, and pt can't lay down. Please speak to pt and advise.

## 2014-09-19 DIAGNOSIS — D508 Other iron deficiency anemias: Secondary | ICD-10-CM

## 2014-09-19 DIAGNOSIS — I214 Non-ST elevation (NSTEMI) myocardial infarction: Secondary | ICD-10-CM

## 2014-09-19 LAB — BASIC METABOLIC PANEL
Anion gap: 20 — ABNORMAL HIGH (ref 5–15)
BUN: 70 mg/dL — AB (ref 6–23)
CHLORIDE: 93 meq/L — AB (ref 96–112)
CO2: 23 mEq/L (ref 19–32)
CREATININE: 3.46 mg/dL — AB (ref 0.50–1.10)
Calcium: 9.4 mg/dL (ref 8.4–10.5)
GFR calc Af Amer: 13 mL/min — ABNORMAL LOW (ref >90)
GFR calc non Af Amer: 11 mL/min — ABNORMAL LOW (ref >90)
GLUCOSE: 144 mg/dL — AB (ref 70–99)
POTASSIUM: 4.5 meq/L (ref 3.7–5.3)
Sodium: 136 mEq/L — ABNORMAL LOW (ref 137–147)

## 2014-09-19 LAB — CBC
HEMATOCRIT: 30.5 % — AB (ref 36.0–46.0)
HEMOGLOBIN: 9.8 g/dL — AB (ref 12.0–15.0)
MCH: 26.8 pg (ref 26.0–34.0)
MCHC: 32.1 g/dL (ref 30.0–36.0)
MCV: 83.6 fL (ref 78.0–100.0)
Platelets: 401 10*3/uL — ABNORMAL HIGH (ref 150–400)
RBC: 3.65 MIL/uL — AB (ref 3.87–5.11)
RDW: 14 % (ref 11.5–15.5)
WBC: 14.3 10*3/uL — ABNORMAL HIGH (ref 4.0–10.5)

## 2014-09-19 LAB — TYPE AND SCREEN
ABO/RH(D): A POS
Antibody Screen: NEGATIVE
UNIT DIVISION: 0
Unit division: 0

## 2014-09-19 LAB — MRSA PCR SCREENING: MRSA by PCR: NEGATIVE

## 2014-09-19 LAB — TROPONIN I
Troponin I: 3.53 ng/mL (ref <0.30)
Troponin I: 6.71 ng/mL (ref <0.30)

## 2014-09-19 MED ORDER — INFLUENZA VAC SPLIT QUAD 0.5 ML IM SUSY
0.5000 mL | PREFILLED_SYRINGE | INTRAMUSCULAR | Status: AC
  Administered 2014-09-20: 0.5 mL via INTRAMUSCULAR
  Filled 2014-09-19: qty 0.5

## 2014-09-19 MED ORDER — CETYLPYRIDINIUM CHLORIDE 0.05 % MT LIQD
7.0000 mL | Freq: Two times a day (BID) | OROMUCOSAL | Status: DC
  Administered 2014-09-19 – 2014-09-20 (×3): 7 mL via OROMUCOSAL

## 2014-09-19 MED ORDER — MORPHINE SULFATE 2 MG/ML IJ SOLN
1.0000 mg | INTRAMUSCULAR | Status: DC | PRN
  Administered 2014-09-19 (×2): 1 mg via INTRAVENOUS
  Filled 2014-09-19 (×2): qty 1

## 2014-09-19 MED ORDER — MORPHINE SULFATE 2 MG/ML IJ SOLN
1.0000 mg | Freq: Once | INTRAMUSCULAR | Status: AC
  Administered 2014-09-19: 1 mg via INTRAVENOUS

## 2014-09-19 MED ORDER — PNEUMOCOCCAL VAC POLYVALENT 25 MCG/0.5ML IJ INJ
0.5000 mL | INJECTION | INTRAMUSCULAR | Status: AC
  Administered 2014-09-20: 0.5 mL via INTRAMUSCULAR
  Filled 2014-09-19: qty 0.5

## 2014-09-19 MED ORDER — FUROSEMIDE 10 MG/ML IJ SOLN
40.0000 mg | Freq: Two times a day (BID) | INTRAMUSCULAR | Status: DC
  Administered 2014-09-20: 40 mg via INTRAVENOUS
  Filled 2014-09-19 (×2): qty 4

## 2014-09-19 MED ORDER — CHLORHEXIDINE GLUCONATE 0.12 % MT SOLN
15.0000 mL | Freq: Two times a day (BID) | OROMUCOSAL | Status: DC
  Administered 2014-09-19 – 2014-09-20 (×3): 15 mL via OROMUCOSAL
  Filled 2014-09-19 (×6): qty 15

## 2014-09-19 MED ORDER — LORAZEPAM 2 MG/ML IJ SOLN
0.5000 mg | INTRAMUSCULAR | Status: DC | PRN
  Filled 2014-09-19: qty 1

## 2014-09-19 MED ORDER — MORPHINE SULFATE 2 MG/ML IJ SOLN
INTRAMUSCULAR | Status: AC
  Administered 2014-09-19: 1 mg via INTRAVENOUS
  Filled 2014-09-19: qty 1

## 2014-09-19 MED ORDER — FUROSEMIDE 10 MG/ML IJ SOLN
20.0000 mg | Freq: Once | INTRAMUSCULAR | Status: AC
  Administered 2014-09-19: 20 mg via INTRAVENOUS

## 2014-09-19 NOTE — Progress Notes (Signed)
Dr. Benjamine MolaVann paged to clarify if patient should be on tele. Pt came to room and VS stable. Pt made comfortable and oriented to room. Stage 1 to sacrum.

## 2014-09-19 NOTE — Progress Notes (Addendum)
Arrived to meet with patient and daughter at bedside.  Assisted patient off of the bedpan , which she actually did very well, able to lift her hips off of the bed and then roll to change bedpad.    She is alert and oriented to name and place, asking for jello.  Appears anxious, repeating her desire for jello and to get up in the recliner chair several times.  Daughter Terri at bedside, offered support that we would meet her mothers needs.  Patient was able to take a small amount of jello and a few ice chips without much difficulty other tham moving the O2 mask.  Had a discussion with Camelia Engerri, (daughter) in the conference room.  She is tearful, aware of her mothers poor prognosis.  Patient has completed living will and does not want ventilation, dialysis or machines at EOL.  The goal is for symptom management and d/c back to her home.  Camelia Engerri is HCPOA, has the paperwork in her car.  Requested her to bring to the nurses station for copies to be made. Camelia Engerri has a sister locally, although she is unable to provide any help as she has multiple medical issues herself making it difficult for her to even leaver her house. Camelia Engerri indicates that her mother is very independent and strong willed, up to this point has refused to agree to any help at home other than Terri.  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  Socially complicated by the fact that Camelia Engerri has her won home in [place] where her 3 grandchildren live of whom she has custody, 4519, 2017 and 4014.  Apparently they are able to care for themselves mostly, she just goes over and checks on them and the house as needed.  Terri did say "I have a lot of irons in the fire", she is using FMLA for time away from work.  Validated her need for additional support services at home, hospice services and Medicare benefit discussed.  Terri did discuss the hospice option with her mother  earlier today.  She said for the first time her mother was able to agree that having some services at home would be helpful.  She agrees with referral for hospice home services.  Discussed goals of care outcome with Dr. Benjamine MolaVann.  She will add anxiety med to orders and most likely move patient out of ICU later today and then move toward d/c home with hospice services.  Will discuss with RNCM  PMT will follow up as needed.  Cindra PresumeSue Ellen Annet Manukyan, RN, MSN, [facility] Palliative Integration   HCPOA and Living Will copy to chart 10/23

## 2014-09-19 NOTE — Progress Notes (Signed)
Utilization review completed. Eddith Mentor, RN, BSN. 

## 2014-09-19 NOTE — Progress Notes (Signed)
Report received from DavenportMaggie, [place] for transfer to [phone]

## 2014-09-19 NOTE — Progress Notes (Signed)
CRITICAL VALUE ALERT  Critical value received:  Troponin of 3.53  Date of notification:  09/19/14  Time of notification:  0010  Critical value read back:Yes.    Nurse who received alert:  Kathlene CoteMelissa Derk Doubek  MD notified (1st page):  Maren ReamerKaren Kirby  Time of first page:  0015  MD notified (2nd page):  Time of second page:  Responding MD:  Maren ReamerKaren Kirby to the bedside to discuss with family  Time MD responded:  0030  Rochele PagesHancock, Jahzara Slattery A, RN

## 2014-09-19 NOTE — Care Management Note (Addendum)
    Page 1 of 2   09/21/2014     8:13:14 AM CARE MANAGEMENT NOTE 09/21/2014  Patient:  [name], [name]   Account Number:  [id number]  Date Initiated:  09/19/2014  Documentation initiated by:  GRAVES-BIGELOW,BRENDA  Subjective/Objective Assessment:   Pt in for acute respiratory failure. Pt is from home and her daughter is staying with pt as of now.     Action/Plan:   Received referral for Home Hospice and daughter is agreeable to [facility]. Referral made for services and information faxed to Hospice. Wkend contact # [phone] Hospice.   Anticipated DC Date:  09/20/2014   Anticipated DC Plan:  [place]  CM consult      Choice offered to / List presented to:  C-4 Adult Children   DME arranged  [facility]      DME agency  [facility]     [place] arranged  HH-1 RN      [place]   Status of service:  In process, will continue to follow Medicare Important Message given?  YES (If response is "NO", the following Medicare IM given date fields will be blank) Date Medicare IM given:  09/19/2014 Medicare IM given by:  GRAVES-BIGELOW,BRENDA Date Additional Medicare IM given:   Additional Medicare IM given by:    Discharge Disposition:  [place]  Per UR Regulation:  Reviewed for med. necessity/level of care/duration of stay  If discussed at [place] of Stay Meetings, dates discussed:    Comments:  09/20/14 13;00 CM met with pt and daughter, Coralyn Mark, in room to confirm arrangements for home.  CM called Horris Latino at [facility] to arrange O2 from [facility]. [place] will deliver O2 to room prior to discharge as Coralyn Mark wishes to drive pt home.  Horris Latino also requested pain mediations.  CM requested meds from MD.  MD wrote prescriptions and were given to daughter.  No other CM needs were communkicated.  Mariane Masters, BSN, CM [phone].  09-19-14 [address], RN,BSN [phone] Unsure when pt will be ready for d/c, However daughter did state that she wants to drive pt home via car. Weekend CM will call Hospice and they will arrange 02 to be delivered to hospital. No further needs at this time.

## 2014-09-19 NOTE — Progress Notes (Signed)
Medicare Important Message given? YES   (If response is "NO", the following Medicare IM given date fields will be blank)   Date Medicare IM given:   Medicare IM given by: Graves-Bigelow, Luis Nickles  

## 2014-09-19 NOTE — Progress Notes (Signed)
PROGRESS NOTE  Deanna KnackCecelia W Herrera NWG:[id number] DOB: 05/03/1930 DOA: 09/18/2014 PCP: Kristian CoveyBURCHETTE,BRUCE W, MD  Assessment/Plan: SOB/acute respiratory failure- suspect a combination of anemia and mild CHF- chest x ray shows mild CHF only patient is unable to tolerate Bipap- placed on non-rebreather S/p 2 units PRBC- ? Etiology of anemia- perhaps CKD as kidney function has worsened since her cath in July  -lasix IV  - iron studies before transfusions (heme negative)  -cards consult: Patient should have palliative care consult and consider hospice -morphine PRN  CKD- follows with Dr. Hyman HopesWebb  Increased troponin- demand ischemia- no interventions per cardiology-known CAD  Poor overall prognosis as patient has severe multi-vessel CAD and stage IV CKD and is not a candidate for CABG  Spoke with daughter at length- she does not want her mother to suffer, wants her to be comfortable but daughter is hard to keep focused on the conversation- says she just needs her dentures in- re-iterated that dentures will not fix her mother's underlying condition   Code Status: DNR Family Communication: daughter Disposition Plan:    Consultants:  Cards- signed off  Procedures:      HPI/Subjective: Working to breathe Not able to tolerate the bipap  Objective: Filed Vitals:   09/19/14 0800  BP:   Pulse:   Temp: 97.5 F (36.4 C)  Resp:     Intake/Output Summary (Last 24 hours) at 09/19/14 0937 Last data filed at 09/19/14 0223  Gross per 24 hour  Intake 1062.17 ml  Output      0 ml  Net 1062.17 ml   Filed Weights   09/18/14 2049  Weight: 53 kg (116 lb 13.5 oz)    Exam:   General:  Ill appearing, elderly  Cardiovascular: rrr  Respiratory: decreased b/l, no wheezing  Abdomen: +BS, thin  Musculoskeletal: no edema  Data Reviewed: Basic Metabolic Panel:  Recent Labs Lab 09/18/14 1503 09/19/14 0553  NA 133* 136*  K 4.3 4.5  CL 93* 93*  CO2 25 23  GLUCOSE 164* 144*    BUN 61* 70*  CREATININE 3.13* 3.46*  CALCIUM 9.8 9.4   Liver Function Tests:  Recent Labs Lab 09/18/14 1503  AST 18  ALT 9  ALKPHOS 61  BILITOT 0.3  PROT 7.4  ALBUMIN 3.6   No results found for this basename: LIPASE, AMYLASE,  in the last 168 hours No results found for this basename: AMMONIA,  in the last 168 hours CBC:  Recent Labs Lab 09/18/14 1503 09/19/14 0553  WBC 11.1* 14.3*  NEUTROABS 9.7*  --   HGB 5.6* 9.8*  HCT 17.7* 30.5*  MCV 80.5 83.6  PLT 427* 401*   Cardiac Enzymes:  Recent Labs Lab 09/18/14 1503 09/18/14 2240 09/19/14 0553  TROPONINI <0.30 3.53* 6.71*   BNP (last 3 results)  Recent Labs  06/16/14 0751 09/18/14 1503  PROBNP 4799.0* 10273.0*   CBG:  Recent Labs Lab 09/18/14 1509  GLUCAP 172*    Recent Results (from the past 240 hour(s))  MRSA PCR SCREENING     Status: None   Collection Time    09/18/14 10:40 PM      Result Value Ref Range Status   MRSA by PCR NEGATIVE  NEGATIVE Final   Comment:            The GeneXpert MRSA Assay (FDA     approved for NASAL specimens     only), is one component of a     comprehensive MRSA colonization  surveillance program. It is not     intended to diagnose MRSA     infection nor to guide or     monitor treatment for     MRSA infections.     Studies: Dg Chest Portable 1 View  09/18/2014   CLINICAL DATA:  Shortness of breath. Combined systolic and diastolic congestive heart failure. Respiratory failure. Chronic kidney disease. Atrial fibrillation. Initial encounter.  EXAM: PORTABLE CHEST - 1 VIEW  COMPARISON:  06/19/2014  FINDINGS: Stable moderately enlarged cardiopericardial silhouette. Indistinct pulmonary vasculature with mild interstitial accentuation bilaterally, increased from prior. Faint Kerley B-lines. No airspace edema.  Tortuous thoracic aorta.  No pleural effusion.  IMPRESSION: 1. Cardiomegaly with mild interstitial pulmonary edema.   Electronically Signed   By: Herbie BaltimoreWalt   Liebkemann M.D.   On: 09/18/2014 15:24    Scheduled Meds: . antiseptic oral rinse  7 mL Mouth Rinse q12n4p  . chlorhexidine  15 mL Mouth Rinse BID  . furosemide  20 mg Intravenous Once  . furosemide  40 mg Intravenous Q12H  . [START ON 09/20/2014] Influenza vac split quadrivalent PF  0.5 mL Intramuscular Tomorrow-1000  .  morphine injection  1 mg Intravenous Once  . morphine      . [START ON 09/20/2014] pneumococcal 23 valent vaccine  0.5 mL Intramuscular Tomorrow-1000  . sodium chloride  3 mL Intravenous Q12H   Continuous Infusions:  Antibiotics Given (last 72 hours)   None      Active Problems:   CKD (chronic kidney disease), stage IV   CAD (coronary artery disease)   Acute respiratory failure with hypoxia   Anemia   SOB (shortness of breath)    Time spent: 35 min    [name]  [facility] Pager [phone]. If 7PM-7AM, please contact night-coverage at [email or web address], password [email or web address] 09/19/2014, 9:37 AM  LOS: 1 day

## 2014-09-19 NOTE — Progress Notes (Signed)
Shift event: RN paged NP secondary to pt troponin being elevated. This NP reviewed chart, d/c summary from July, and today's cardio note. Pt has a hx of NSTEMI and multi vessel CAD confirmed on cath in July. At that time, medical mngt was all that could be done as pt is/was not a candidate for stenting/CABG. She was on Plavix at home. However, when admitted here for severe anemia, blood thinners were stopped. Pt has not c/o chest pain.  Now, her troponin is elevated and we face same situation. Given above information and documentation on the chart about medical mngt, this NP called pt's daughter at home to discuss. Daughter is aware that her mother has "blockages" in her heart and there was no procedure to correct this. Daughter understands that her mother can presently not take a blood thinner due to anemia.  As[facility] Center[facility]JKG, NP [facility]

## 2014-09-19 NOTE — Progress Notes (Signed)
Pt toTX to 5W-23, VSS  Several attempts to I&O cath & Foley cath were attempted NS & DS. Fortunately, Pt now voiding on own post lasix admin.  Pt off BiPAP, venti, now [place] @ 6L.

## 2014-09-19 NOTE — Progress Notes (Signed)
CRITICAL VALUE ALERT  Critical value received:  Troponin 6.71    Date of notification:  Was not notified, saw during report   Time of notification:  Not notified   Critical value read back:No.  Nurse who received alert:  Kathlene CoteMelissa Conya Ellinwood and Domingo DimesMaggie Vaughn   MD notified (1st page):  Dr. Marlin CanaryJessica Vann  Time of first page:  0725  MD notified (2nd page):  Time of second page:  Responding MD:  Awaiting response   Time MD responded:  Awaiting response   Billal Rollo, Caleen JobsMelissa A, RN

## 2014-09-20 DIAGNOSIS — I5033 Acute on chronic diastolic (congestive) heart failure: Secondary | ICD-10-CM | POA: Diagnosis not present

## 2014-09-20 MED ORDER — MORPHINE SULFATE 20 MG/5ML PO SOLN: 5.0000 mg | ORAL | Status: AC | PRN

## 2014-09-20 MED ORDER — FUROSEMIDE 40 MG PO TABS
40.0000 mg | ORAL_TABLET | Freq: Every day | ORAL | Status: DC
  Administered 2014-09-20: 40 mg via ORAL
  Filled 2014-09-20: qty 1

## 2014-09-20 MED ORDER — LORAZEPAM 2 MG/ML PO CONC: 0.6000 mg | Freq: Four times a day (QID) | ORAL | Status: AC | PRN

## 2014-09-20 NOTE — Progress Notes (Signed)
Shelby Dubinecelia W Mccorvey to be D/C'd Home per MD order.  Discussed with the patient and all questions fully answered.    Medication List    STOP taking these medications       bisoprolol 5 MG tablet  Commonly known as:  ZEBETA     CINNAMON PO     CLARITIN 10 MG tablet  Generic drug:  loratadine     hydrALAZINE 25 MG tablet  Commonly known as:  APRESOLINE     isosorbide mononitrate 60 MG 24 hr tablet  Commonly known as:  IMDUR     multivitamin with minerals tablet     Vitamin D-3 5000 UNITS Tabs      TAKE these medications       aspirin EC 81 MG tablet  Take 1 tablet (81 mg total) by mouth daily.     b complex vitamins tablet  Take 1 tablet by mouth daily.     clopidogrel 75 MG tablet  Commonly known as:  PLAVIX  Take 1 tablet (75 mg total) by mouth daily with breakfast.     fish oil-omega-3 fatty acids 1000 MG capsule  Take 1,300 g by mouth 2 (two) times daily.     furosemide 40 MG tablet  Commonly known as:  LASIX  Take 40 mg by mouth daily.     LORazepam 2 MG/ML concentrated solution  Commonly known as:  ATIVAN  Take 0.3 mLs (0.6 mg total) by mouth every 6 (six) hours as needed for anxiety.     morphine 20 MG/5ML solution  Take 1.3 mLs (5.2 mg total) by mouth every 2 (two) hours as needed for pain.     nitroGLYCERIN 0.4 MG SL tablet  Commonly known as:  NITROSTAT  Place 1 tablet (0.4 mg total) under the tongue every 5 (five) minutes x 3 doses as needed for chest pain.        VVS, Skin clean, dry and intact without evidence of skin break down, no evidence of skin tears noted. IV catheter discontinued intact. Site without signs and symptoms of complications. Dressing and pressure applied.  An After Visit Summary was printed and given to the patient.  D/c education completed with patient/family including follow up instructions, medication list, d/c activities limitations if indicated, with other d/c instructions as indicated by MD - patient able to verbalize  understanding, all questions fully answered.   Patient instructed to return to ED, call 911, or call MD for any changes in condition.   Patient escorted via WC, and D/C home via private auto.  Beckey DowningFlores, Cartier Washko F 09/20/2014 4:02 PM

## 2014-09-20 NOTE — Progress Notes (Signed)
SATURATION QUALIFICATIONS: (This note is used to comply with regulatory documentation for home oxygen)  Patient Saturations on Room Air at Rest = 98%  Patient Saturations on Room Air while Ambulating = 90%  Patient Saturations on 5 Liters of oxygen while Ambulating = 100%  Please briefly explain why patient needs home oxygen:

## 2014-09-20 NOTE — Discharge Summary (Addendum)
Physician Discharge Summary  [name] ZOX:[id number] DOB: 11/03/1930 DOA: 09/18/2014  PCP: Kristian CoveyBURCHETTE,BRUCE W, MD  Admit date: 09/18/2014 Discharge date: 09/20/2014  Time spent: 35 minutes  Recommendations for Outpatient Follow-up:  1. DNR 2. Home with hospice  Discharge Diagnoses:  Active Problems:   CKD (chronic kidney disease), stage IV   CAD (coronary artery disease)   Acute respiratory failure with hypoxia   Anemia   SOB (shortness of breath) diastolic CHF  Discharge Condition: improved  Diet recommendation: low Na  Filed Weights   09/18/14 2049  Weight: 53 kg (116 lb 13.5 oz)    History of present illness:  [name] is a 80 y.o. female  Who has a long PMHX. Most recently in July, she was hospitalized for acute respiratory failure requiring intubation. She was found on cath to have severe multi-vessel CAD and stage IV CKD. Her creatinine went from 2.6 pre-cath on 7/30 to 3.4 on 8/4 after cath. She is being treated with medical management. Daughter reports that over the last few days she has had worsening sOB. She can't lay down. Last PM she had B/L arm pain and took a nitro. This AM, she called EMS to bring her to the hospital. Sats were 88% when EMS arrives, placed on 6L and increased to 100%  DAughter states that her stools have been normal. No new medications  In the ER, her Hgb was found to be 5.6- hemoccult negative. Chest x ray showed mild CHF. She was placed on Bipap with improvement in breathing. She does not want to be a full code- does not want intubation or CPR   Hospital Course:  SOB/acute respiratory failure- suspect a combination of anemia and mild CHF-  S/p 2 units PRBC-  Lasix - iron studies before transfusions (heme negative)  -cards consult: Patient should have palliative care consult and consider hospice  -morphine PRN  -ativan PRN -going home with hospice- no re-hospitalizations  Diastolic CHF  CKD- follows with Dr. Hyman HopesWebb    Increased troponin- demand ischemia- no interventions per cardiology-known CAD   Home with hospice   Procedures:    Consultations:  Cards  Palliative care  Discharge Exam: Filed Vitals:   09/20/14 0533  BP: 109/61  Pulse: 78  Temp: 98.3 F (36.8 C)  Resp: 20    General: A+Ox3, NAD- wants to go home   Discharge Instructions You were cared for by a hospitalist during your hospital stay. If you have any questions about your discharge medications or the care you received while you were in the hospital after you are discharged, you can call the unit and asked to speak with the hospitalist on call if the hospitalist that took care of you is not available. Once you are discharged, your primary care physician will handle any further medical issues. Please note that NO REFILLS for any discharge medications will be authorized once you are discharged, as it is imperative that you return to your primary care physician (or establish a relationship with a primary care physician if you do not have one) for your aftercare needs so that they can reassess your need for medications and monitor your lab values.  Discharge Instructions   Diet - low sodium heart healthy    Complete by:  As directed      Discharge instructions    Complete by:  As directed   Home hospice     Increase activity slowly    Complete by:  As directed  Current Discharge Medication List    START taking these medications   Details  LORazepam (ATIVAN) 2 MG/ML concentrated solution Take 0.3 mLs (0.6 mg total) by mouth every 6 (six) hours as needed for anxiety. Qty: 30 mL, Refills: 0    morphine 20 MG/5ML solution Take 1.3 mLs (5.2 mg total) by mouth every 2 (two) hours as needed for pain. Qty: 100 mL, Refills: 0      CONTINUE these medications which have NOT CHANGED   Details  b complex vitamins tablet Take 1 tablet by mouth daily.      clopidogrel (PLAVIX) 75 MG tablet Take 1 tablet (75 mg total)  by mouth daily with breakfast. Qty: 30 tablet, Refills: 6    fish oil-omega-3 fatty acids 1000 MG capsule Take 1,300 g by mouth 2 (two) times daily.     furosemide (LASIX) 40 MG tablet Take 40 mg by mouth daily.    nitroGLYCERIN (NITROSTAT) 0.4 MG SL tablet Place 1 tablet (0.4 mg total) under the tongue every 5 (five) minutes x 3 doses as needed for chest pain. Qty: 25 tablet, Refills: 4    aspirin EC 81 MG tablet Take 1 tablet (81 mg total) by mouth daily.      STOP taking these medications     bisoprolol (ZEBETA) 5 MG tablet      bisoprolol (ZEBETA) 5 MG tablet      Cholecalciferol (VITAMIN D-3) 5000 UNITS TABS      CINNAMON PO      hydrALAZINE (APRESOLINE) 25 MG tablet      isosorbide mononitrate (IMDUR) 60 MG 24 hr tablet      loratadine (CLARITIN) 10 MG tablet      Multiple Vitamins-Minerals (MULTIVITAMIN WITH MINERALS) tablet      isosorbide mononitrate (IMDUR) 60 MG 24 hr tablet        Allergies  Allergen Reactions  . Metoprolol Swelling    Takes bisoprolol without issue  . Norvasc [Amlodipine Besylate] Swelling    Takes nisoldipine without issue  . Sulfonamide Derivatives Hives, Itching and Swelling  . Amiodarone Nausea And Vomiting  . Medrol [Methylprednisolone]     Fluid retention/CHF exacerbation - caution with steroids in future if ever needed  . Statins     Myalgias - "I won't take them"  . Coumadin [Warfarin Sodium] Rash  . Latex Itching      The results of significant diagnostics from this hospitalization (including imaging, microbiology, ancillary and laboratory) are listed below for reference.    Significant Diagnostic Studies: Dg Chest Portable 1 View  09/18/2014   CLINICAL DATA:  Shortness of breath. Combined systolic and diastolic congestive heart failure. Respiratory failure. Chronic kidney disease. Atrial fibrillation. Initial encounter.  EXAM: PORTABLE CHEST - 1 VIEW  COMPARISON:  06/19/2014  FINDINGS: Stable moderately enlarged  cardiopericardial silhouette. Indistinct pulmonary vasculature with mild interstitial accentuation bilaterally, increased from prior. Faint Kerley B-lines. No airspace edema.  Tortuous thoracic aorta.  No pleural effusion.  IMPRESSION: 1. Cardiomegaly with mild interstitial pulmonary edema.   Electronically Signed   By: Herbie Baltimore M.D.   On: 09/18/2014 15:24    Microbiology: Recent Results (from the past 240 hour(s))  MRSA PCR SCREENING     Status: None   Collection Time    09/18/14 10:40 PM      Result Value Ref Range Status   MRSA by PCR NEGATIVE  NEGATIVE Final   Comment:            The GeneXpert  MRSA Assay (FDA     approved for NASAL specimens     only), is one component of a     comprehensive MRSA colonization     surveillance program. It is not     intended to diagnose MRSA     infection nor to guide or     monitor treatment for     MRSA infections.     Labs: Basic Metabolic Panel:  Recent Labs Lab 09/18/14 1503 09/19/14 0553  NA 133* 136*  K 4.3 4.5  CL 93* 93*  CO2 25 23  GLUCOSE 164* 144*  BUN 61* 70*  CREATININE 3.13* 3.46*  CALCIUM 9.8 9.4   Liver Function Tests:  Recent Labs Lab 09/18/14 1503  AST 18  ALT 9  ALKPHOS 61  BILITOT 0.3  PROT 7.4  ALBUMIN 3.6   No results found for this basename: LIPASE, AMYLASE,  in the last 168 hours No results found for this basename: AMMONIA,  in the last 168 hours CBC:  Recent Labs Lab 09/18/14 1503 09/19/14 0553  WBC 11.1* 14.3*  NEUTROABS 9.7*  --   HGB 5.6* 9.8*  HCT 17.7* 30.5*  MCV 80.5 83.6  PLT 427* 401*   Cardiac Enzymes:  Recent Labs Lab 09/18/14 1503 09/18/14 2240 09/19/14 0553  TROPONINI <0.30 3.53* 6.71*   BNP: BNP (last 3 results)  Recent Labs  06/16/14 0751 09/18/14 1503  PROBNP 4799.0* 10273.0*   CBG:  Recent Labs Lab 09/18/14 1509  GLUCAP 172*       Signed:  Kaeden Mester  [facility] 09/20/2014, 4:00 PM

## 2014-10-07 ENCOUNTER — Other Ambulatory Visit: Payer: Self-pay | Admitting: Cardiovascular Disease

## 2014-10-10 ENCOUNTER — Telehealth: Payer: Self-pay | Admitting: Nurse Practitioner

## 2014-10-10 ENCOUNTER — Other Ambulatory Visit: Payer: Self-pay | Admitting: Cardiovascular Disease

## 2014-10-10 NOTE — Telephone Encounter (Deleted)
Error

## 2014-11-06 ENCOUNTER — Encounter (HOSPITAL_COMMUNITY): Payer: Self-pay | Admitting: Cardiovascular Disease

## 2014-11-12 ENCOUNTER — Other Ambulatory Visit: Payer: Self-pay | Admitting: *Deleted

## 2014-11-12 ENCOUNTER — Telehealth: Payer: Self-pay | Admitting: Nurse Practitioner

## 2014-11-12 MED ORDER — FUROSEMIDE 40 MG PO TABS: 40.0000 mg | ORAL_TABLET | Freq: Two times a day (BID) | ORAL | Status: AC

## 2014-11-12 NOTE — Telephone Encounter (Signed)
New message         Pt would like to increase furosemide   is this ok

## 2014-11-12 NOTE — Telephone Encounter (Signed)
S/w pt wanted new script for lasix pt is now taking ( 40 mg ) bid.  [facility]. Pt stated appreciation.

## 2014-12-29 ENCOUNTER — Telehealth: Payer: Self-pay | Admitting: Family Medicine

## 2014-12-29 NOTE — Telephone Encounter (Signed)
OK 

## 2014-12-29 NOTE — Telephone Encounter (Signed)
[place]  [employer] [employer] is going to mail death certificate for dr to sign.

## 2014-12-29 DEATH — deceased

## 2015-01-26 ENCOUNTER — Telehealth: Payer: Self-pay

## 2015-01-26 NOTE — Telephone Encounter (Signed)
Patient died @ Home per Obituary °

## 2015-03-27 ENCOUNTER — Ambulatory Visit: Payer: Medicare Other | Admitting: Nurse Practitioner

## 2015-09-30 IMAGING — CR DG CHEST 2V
2 series · 2 of 2 positions shown · non-contrast
Comparison: 04/10/2013

CLINICAL DATA: Cough, nausea

EXAM:
CHEST  2 VIEW

[w chest pa]
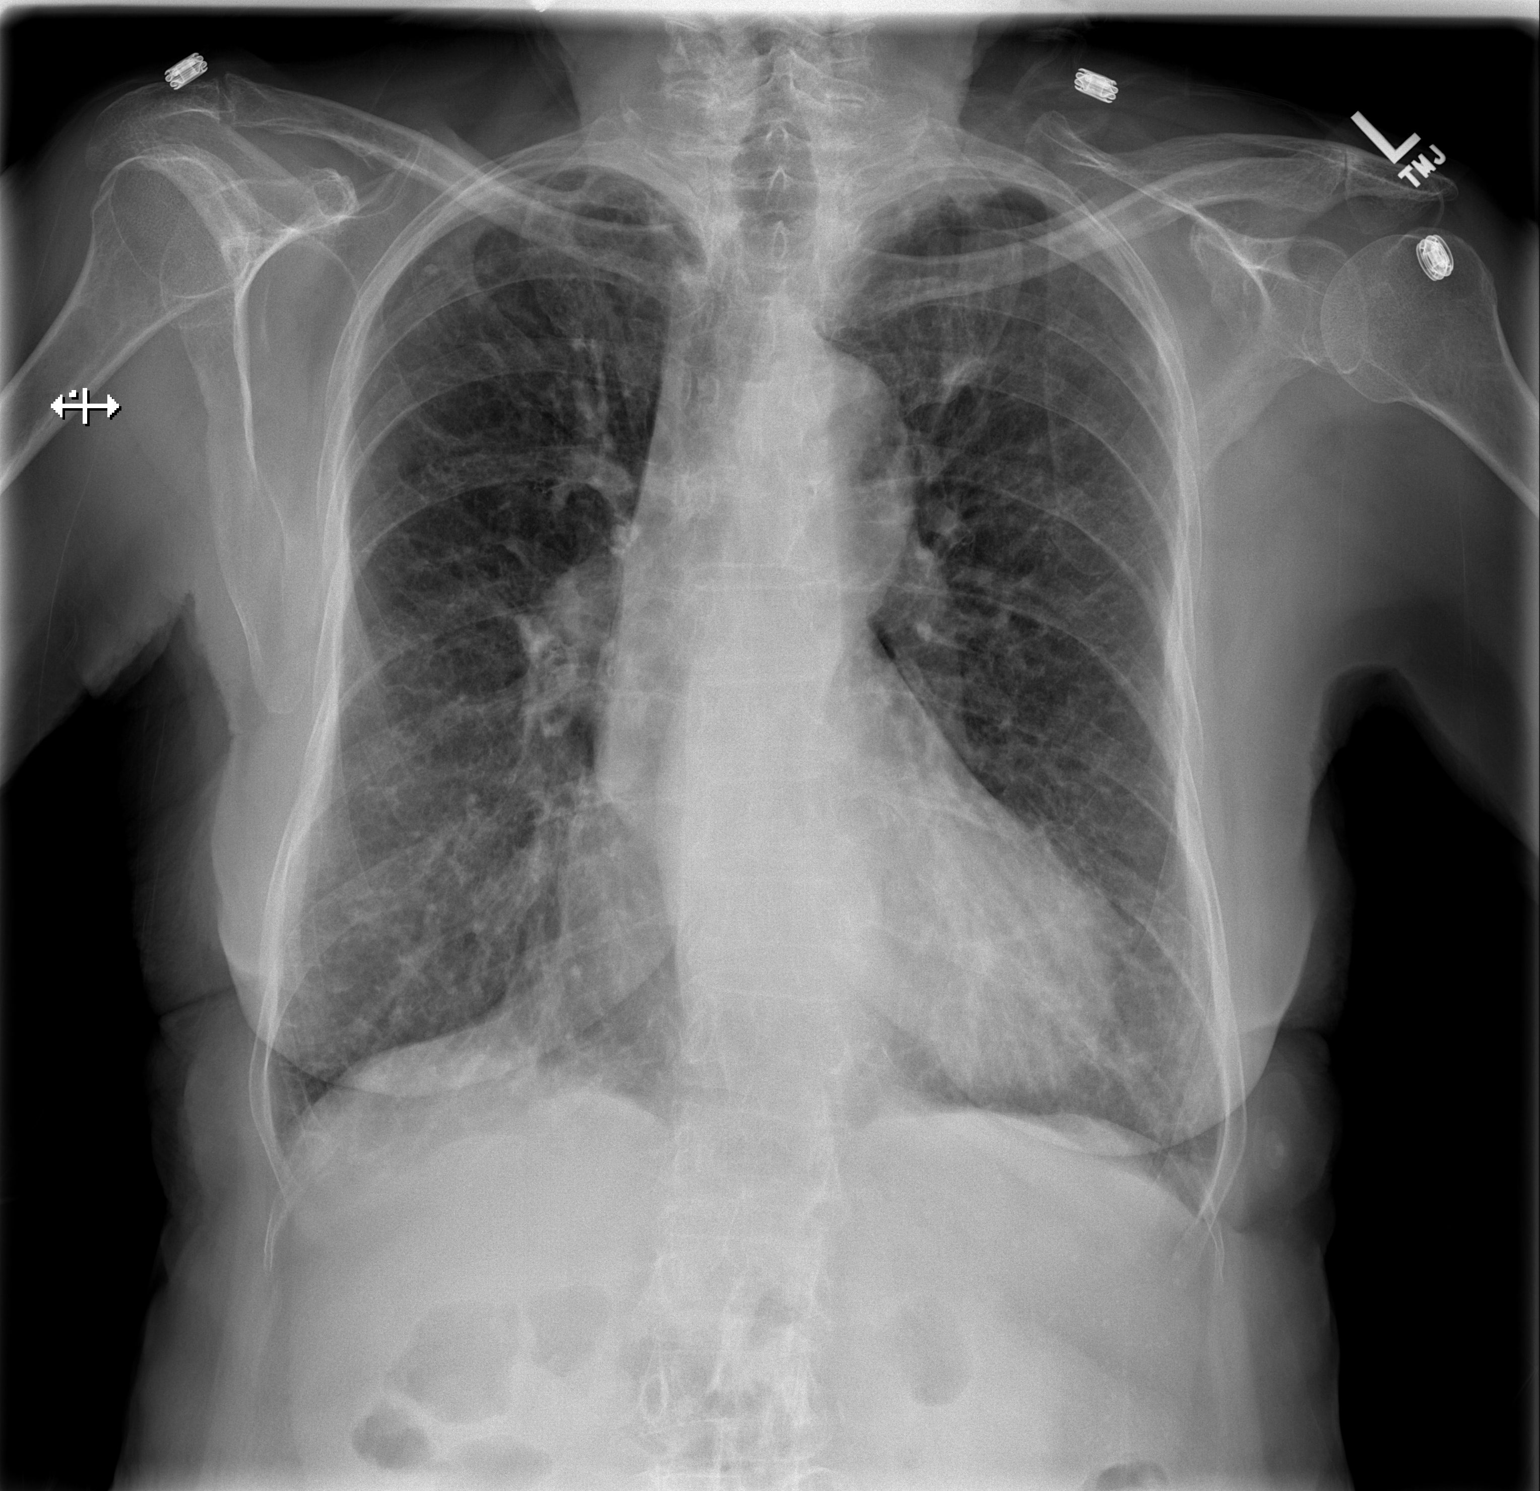

[w chest lat]
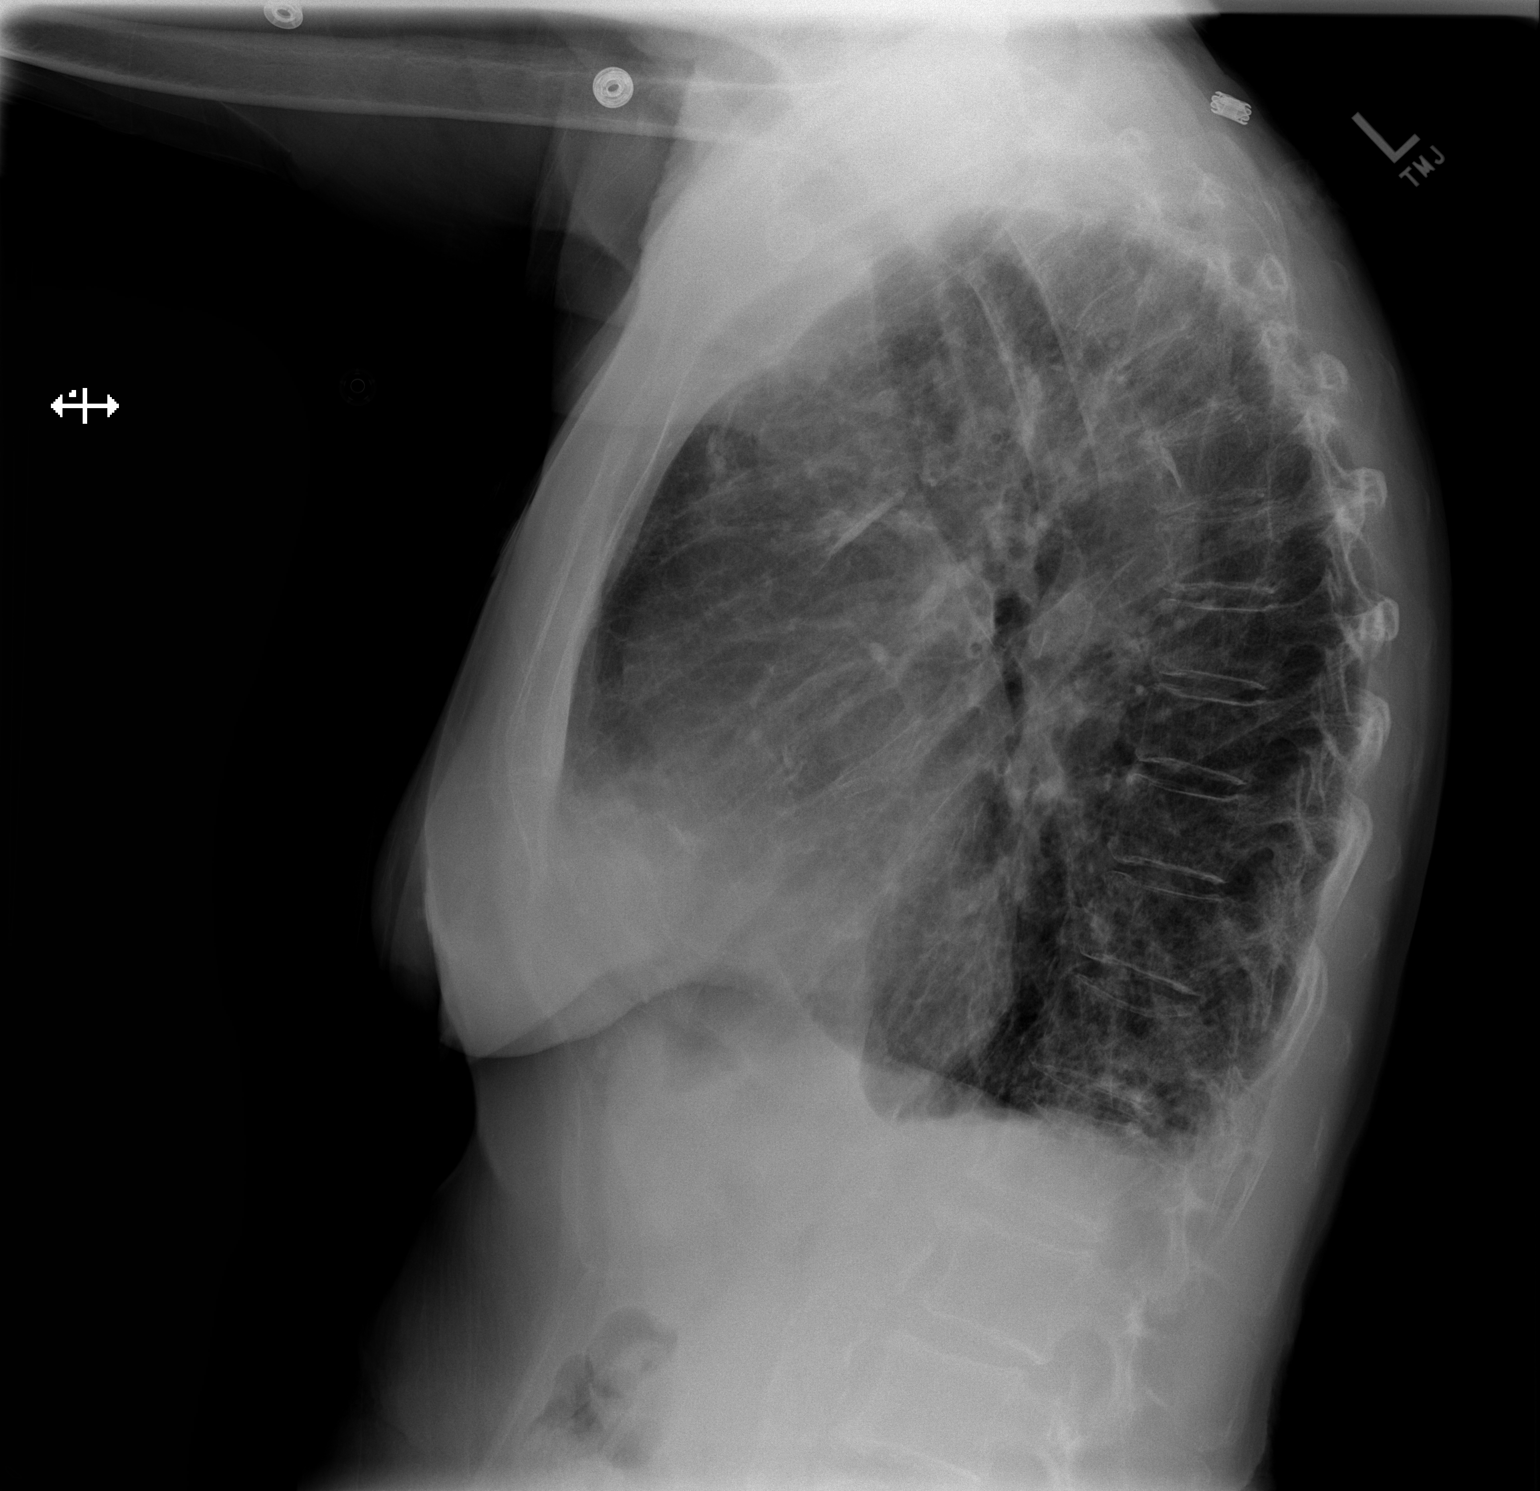

[2 of 2 positions shown; findings below may reference images not displayed]

FINDINGS: The aorta is unfolded and ectatic. Mild moderate enlargement of the
cardiac silhouette is noted. Central vascular congestion is
identified with peripheral Kerley B-lines compatible with
interstitial edema. Trace effusions are noted. Hyperinflation
suggests emphysema. Atheromatous aortic calcification noted without
calcified aneurysm.
IMPRESSION: Cardiomegaly with mild interstitial pulmonary edema and trace
pleural effusions.

## 2015-09-30 IMAGING — CR DG CHEST 1V PORT
1 series · 1 of 1 positions shown · non-contrast
Comparison: Chest radiograph 06/16/2014.

CLINICAL DATA: Chest pain, shortness of breath.

EXAM:
PORTABLE CHEST - 1 VIEW

[portable]
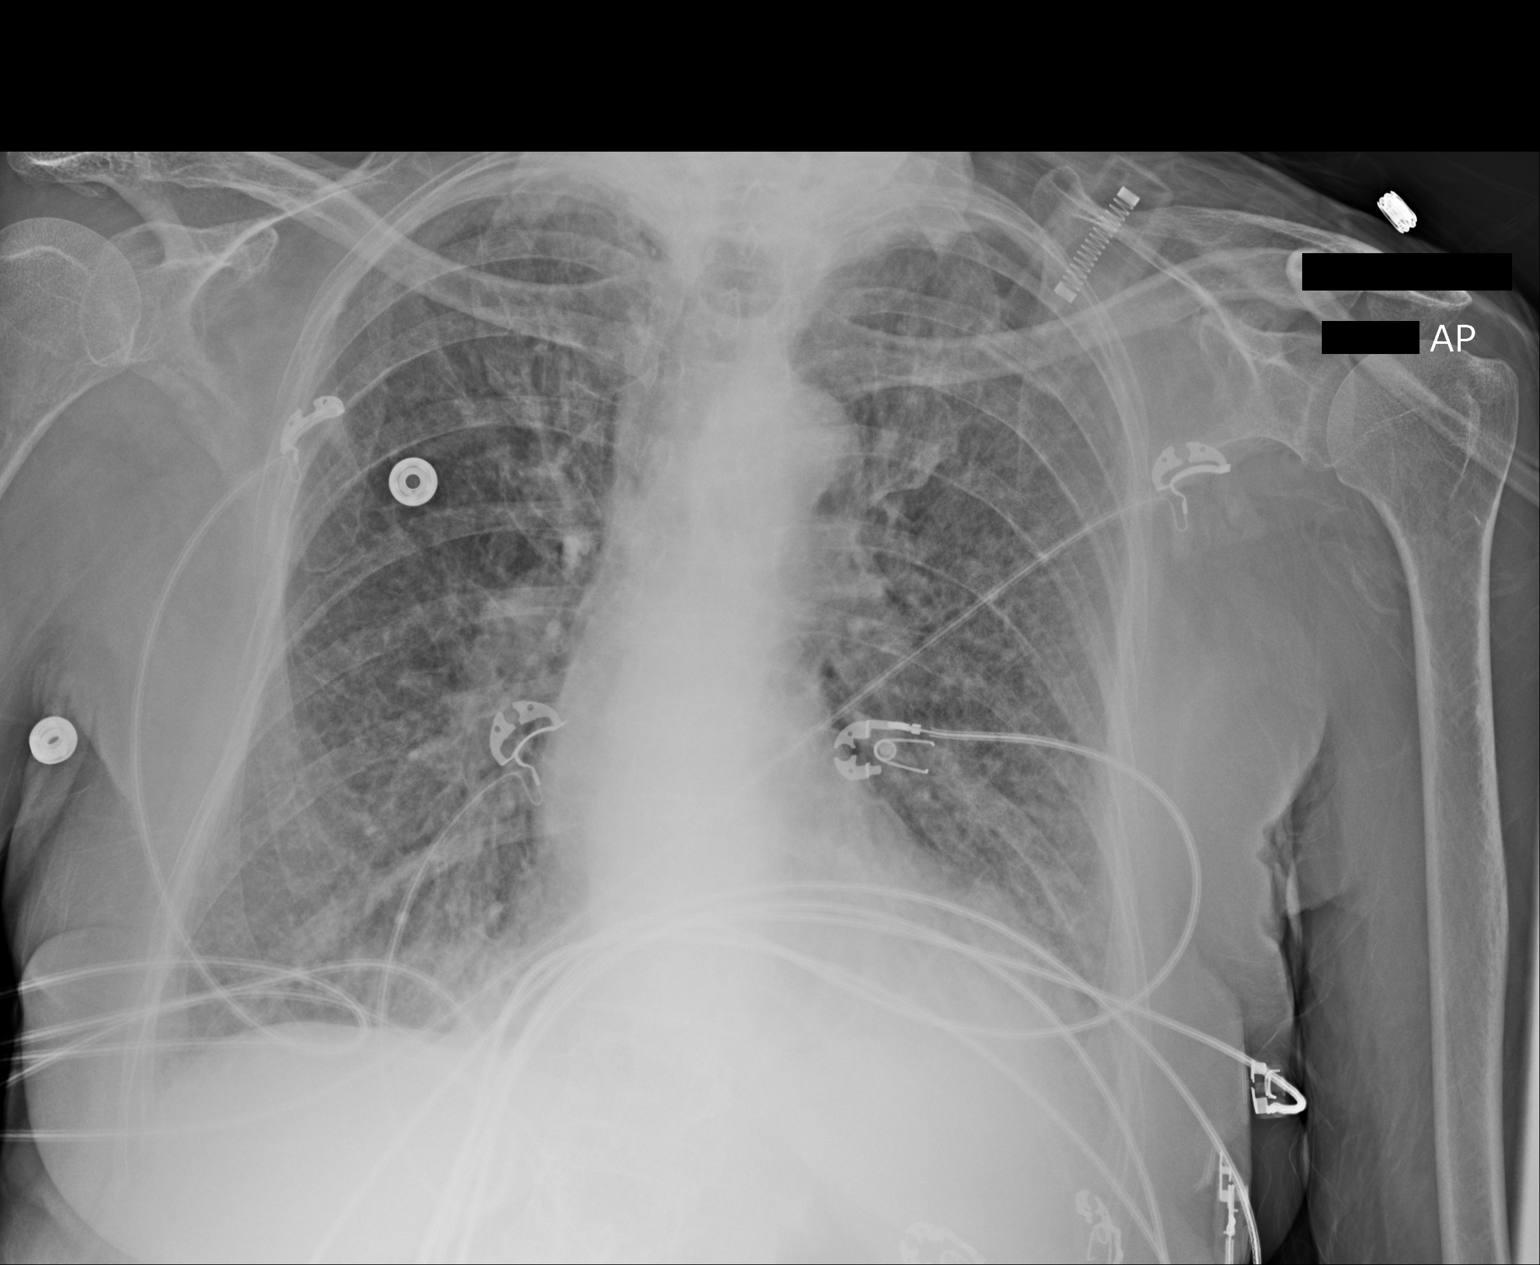

[1 of 1 positions shown; findings below may reference images not displayed]

FINDINGS: Stable cardiac and mediastinal contours. Interval increase in
bilateral interstitial pulmonary opacities. Retrocardiac
consolidation with obscuration of the left hemidiaphragm. No
definite pleural effusion or pneumothorax.
IMPRESSION: Interval development of bilateral interstitial pulmonary opacities,
suggestive of congestive heart failure.

Retrocardiac consolidative opacity favored to represent atelectasis.
Infection not excluded.

## 2015-10-01 IMAGING — CR DG CHEST 1V PORT
1 series · 1 of 1 positions shown · non-contrast
Comparison: 06/16/2014.

CLINICAL DATA: Shortness of breath.

EXAM:
PORTABLE CHEST - 1 VIEW

[AP]
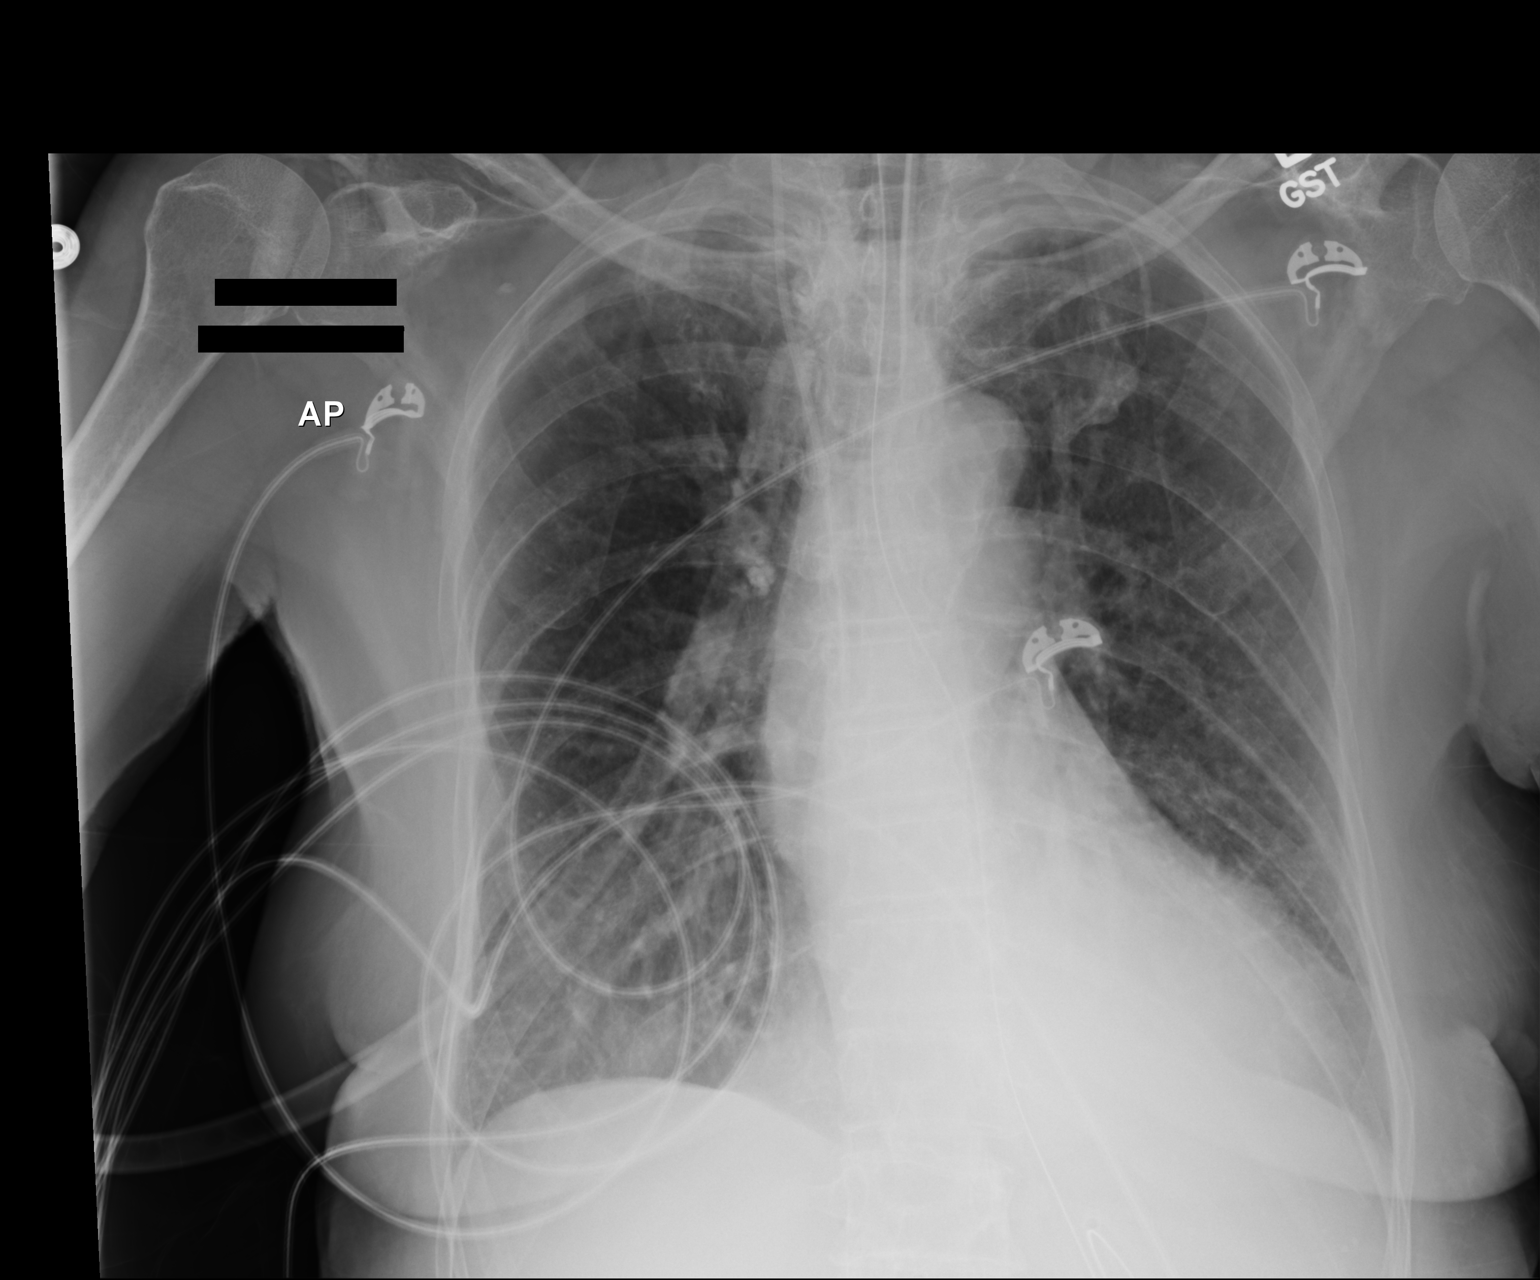

[1 of 1 positions shown; findings below may reference images not displayed]

FINDINGS: Endotracheal tube, NG tube, right IJ line in stable position. Stable
bibasilar atelectasis and/or infiltrates. No pleural effusion or
pneumothorax. Heart size and pulmonary vascularity stable. No acute
osseous abnormality.
IMPRESSION: 1. Stable line and tube positions.
2. Stable bibasilar atelectasis and/or infiltrates.

## 2019-11-20 NOTE — Telephone Encounter (Signed)
error
